# Patient Record
Sex: Female | Born: 1950
Health system: Southern US, Community
[De-identification: ages and names within clinical notes are randomized; demographics above are authoritative.]

## PROBLEM LIST (undated history)

## (undated) DIAGNOSIS — I499 Cardiac arrhythmia, unspecified: Secondary | ICD-10-CM

## (undated) DIAGNOSIS — D649 Anemia, unspecified: Secondary | ICD-10-CM

## (undated) DIAGNOSIS — I639 Cerebral infarction, unspecified: Secondary | ICD-10-CM

## (undated) DIAGNOSIS — I4891 Unspecified atrial fibrillation: Secondary | ICD-10-CM

## (undated) DIAGNOSIS — H269 Unspecified cataract: Secondary | ICD-10-CM

## (undated) DIAGNOSIS — Z5189 Encounter for other specified aftercare: Secondary | ICD-10-CM

## (undated) DIAGNOSIS — T7840XA Allergy, unspecified, initial encounter: Secondary | ICD-10-CM

## (undated) DIAGNOSIS — I739 Peripheral vascular disease, unspecified: Secondary | ICD-10-CM

## (undated) DIAGNOSIS — M810 Age-related osteoporosis without current pathological fracture: Secondary | ICD-10-CM

## (undated) DIAGNOSIS — C801 Malignant (primary) neoplasm, unspecified: Secondary | ICD-10-CM

## (undated) DIAGNOSIS — Z8719 Personal history of other diseases of the digestive system: Secondary | ICD-10-CM

## (undated) DIAGNOSIS — K219 Gastro-esophageal reflux disease without esophagitis: Secondary | ICD-10-CM

## (undated) DIAGNOSIS — E785 Hyperlipidemia, unspecified: Secondary | ICD-10-CM

## (undated) DIAGNOSIS — D126 Benign neoplasm of colon, unspecified: Secondary | ICD-10-CM

## (undated) HISTORY — DX: Unspecified cataract: H26.9

## (undated) HISTORY — DX: Malignant (primary) neoplasm, unspecified: C80.1

## (undated) HISTORY — DX: Encounter for other specified aftercare: Z51.89

## (undated) HISTORY — DX: Anemia, unspecified: D64.9

## (undated) HISTORY — DX: Gastro-esophageal reflux disease without esophagitis: K21.9

## (undated) HISTORY — DX: Age-related osteoporosis without current pathological fracture: M81.0

## (undated) HISTORY — PX: EYE SURGERY: SHX253

## (undated) HISTORY — DX: Benign neoplasm of colon, unspecified: D12.6

## (undated) HISTORY — DX: Hyperlipidemia, unspecified: E78.5

## (undated) HISTORY — DX: Cardiac arrhythmia, unspecified: I49.9

## (undated) HISTORY — DX: Allergy, unspecified, initial encounter: T78.40XA

---

## 1977-05-17 HISTORY — PX: LAPAROTOMY: SHX154

## 1997-05-17 HISTORY — PX: OTHER SURGICAL HISTORY: SHX169

## 2011-05-18 DIAGNOSIS — C4490 Unspecified malignant neoplasm of skin, unspecified: Secondary | ICD-10-CM

## 2011-05-18 HISTORY — DX: Unspecified malignant neoplasm of skin, unspecified: C44.90

## 2014-04-16 LAB — HM MAMMOGRAPHY: HM MAMMO: NORMAL

## 2014-05-17 HISTORY — PX: COLONOSCOPY: SHX174

## 2014-07-16 LAB — HM COLONOSCOPY: HM Colonoscopy: 1

## 2014-10-29 ENCOUNTER — Encounter: Payer: Self-pay | Admitting: Physician Assistant

## 2014-11-07 ENCOUNTER — Other Ambulatory Visit: Payer: Self-pay | Admitting: Family Medicine

## 2014-11-07 ENCOUNTER — Encounter: Payer: Self-pay | Admitting: Physician Assistant

## 2014-11-07 ENCOUNTER — Ambulatory Visit (INDEPENDENT_AMBULATORY_CARE_PROVIDER_SITE_OTHER): Payer: Federal, State, Local not specified - PPO | Admitting: Physician Assistant

## 2014-11-07 VITALS — BP 110/78 | HR 70 | Temp 97.5°F | Resp 16 | Ht 60.0 in | Wt 118.0 lb

## 2014-11-07 DIAGNOSIS — R87619 Unspecified abnormal cytological findings in specimens from cervix uteri: Secondary | ICD-10-CM | POA: Diagnosis not present

## 2014-11-07 DIAGNOSIS — N905 Atrophy of vulva: Secondary | ICD-10-CM

## 2014-11-07 MED ORDER — ESTRADIOL 0.1 MG/GM VA CREA: 1.0000 | TOPICAL_CREAM | Freq: Every day | VAGINAL | Status: DC

## 2014-11-07 NOTE — Telephone Encounter (Signed)
escribe error, vaginal cream resent

## 2014-11-07 NOTE — Progress Notes (Signed)
Patient ID: Terri Alexander MRN: [id number], DOB: 05/15/1951, 64 y.o. Date of Encounter: @DATE @  Chief Complaint:  Chief Complaint  Patient presents with  . new pt get est    not due for CPE    SHE GOES BY THE NAME   "Terri Alexander"------NOT Terri Alexander HPI: 64 y.o. year old white female  presents with her husband for visit today.  They are both here to establish care as new patients to our practice.  They recently moved here from [place] and 08/2014.  She went to [facility]. She states that she would go there once a year for complete physical exam. Otherwise would just go there PRN. Says her last complete physical exam there was 01/2014.  Says that she also was seeing a Editor, commissioning.  Says that she recently had an abnormal Pap smear and they told her to have a repeat Pap smear in 6 months.  She went for that 6 month follow-up in 07/2014. Pap Smear was normal but they did schedule her follow-up visit there for October. Says that she has had an abnormal Pap smear once or twice over the years. However says that she has never had to have any type of procedures done--- no LEEP procedures etc. Discussed doing a referral to gynecologist to have follow-up with the GYN. Says that she has had no other GYN history.  She would like to do her follow-up Pap smear here when she comes for her physical here in October. Says that if she ends up having any further problems then she would be glad to go to the gynecologist if necessary.  Says that she uses Premarin cream "for dryness". Is on no other prescription medications.  She has no complaints or concerns today.  Just here to establish care.   Past Medical History  Diagnosis Date  . Allergy   . Cancer 2013    Skin cancer  . Blood transfusion without reported diagnosis     75 months old     Home Meds: Outpatient Prescriptions Prior to Visit  Medication Sig Dispense Refill  . aspirin 81 MG tablet Take 81 mg by mouth daily.    . Biotin  1000 MCG tablet Take 1,000 mcg by mouth 3 (three) times daily.    . Calcium Carb-Cholecalciferol (CALCIUM 600 + D PO) Take 600 mg by mouth once.    . loratadine (ALLERGY RELIEF) 10 MG tablet Take 10 mg by mouth daily.    . Methylcellulose, Laxative, (FIBER THERAPY PO) Take by mouth.    . Multiple Vitamin (MULTIVITAMIN) tablet Take 1 tablet by mouth daily.    . Vitamin D, Cholecalciferol, 400 UNITS CAPS Take 400 Units by mouth once.    . vitamin E 400 UNIT capsule Take 400 Units by mouth daily.    Marland Kitchen estrogens, conjugated, (PREMARIN) 1.25 MG tablet Take 1 mg by mouth daily.     No facility-administered medications prior to visit.    Allergies: No Known Allergies  History   Social History  . Marital Status: Married    Spouse Name: N/A  . Number of Children: N/A  . Years of Education: N/A   Occupational History  . Not on file.   Social History Main Topics  . Smoking status: Never Smoker   . Smokeless tobacco: Never Used  . Alcohol Use: 0.6 oz/week    1 Glasses of wine per week  . Drug Use: No  . Sexual Activity: Not Currently   Other Topics Concern  .  Not on file   Social History Narrative    Family History  Problem Relation Age of Onset  . Arthritis Mother   . Heart disease Mother   . Vision loss Mother   . Stroke Mother   . Depression Sister   . Cancer Maternal Grandmother   . Cancer Maternal Grandfather   . Early death Maternal Grandfather   . Heart disease Maternal Grandfather   . Early death Paternal Grandfather   . Heart disease Paternal Grandfather   . Depression Sister   . Diabetes Other     Juvenile     Review of Systems:  See HPI for pertinent ROS. All other ROS negative.    Physical Exam: Blood pressure 110/78, pulse 70, temperature 97.5 F (36.4 C), temperature source Oral, resp. rate 16, height 5' (1.524 m), weight 118 lb (53.524 kg)., Body mass index is 23.05 kg/(m^2). General: WNWD WF. Appears in no acute distress.  Neck: Supple. No  thyromegaly. No lymphadenopathy. No Carotid bruits. Lungs: Clear bilaterally to auscultation without wheezes, rales, or rhonchi. Breathing is unlabored. Heart: RRR with S1 S2. No murmurs, rubs, or gallops. Musculoskeletal:  Strength and tone normal for age. Extremities/Skin: Warm and dry.  No LE edema. Neuro: Alert and oriented X 3. Moves all extremities spontaneously. Gait is normal. CNII-XII grossly in tact. Psych:  Responds to questions appropriately with a normal affect.     ASSESSMENT AND PLAN:  64 y.o. year old female with  1. Vulvar atrophy Continue Premarin cream  2. Abnormal cervical Papanicolaou smear, unspecified abnormal pap finding She had an abnormal Pap smear was told to follow-up 6 months.  She had this follow-up repeat Pap smear 07/2014. That was normal.  She was scheduled for follow-up with gynecology in October. She will follow-up here for CPE with Pap smear in October.  She is scheduling a follow-up appointment with me for October. Complete physical exam with Pap smear. She is to come fasting to that appointment.  PREVENTIVE CARE:  Mammogram: 04/2014  Colonoscopy: 07/2014----one small polyp. Negative. Repeat 5 years.  Immunizations: Influenza vaccine--------------------- she reports that she gets this each fall Tetanus vaccine----------------------- he reports that this is overdue and she is interested in getting this. Unfortunately, our office is out of this today 10/2014.---WILL ADMINISITER THIS AT HER CPE 01/2015 Pneumonia vaccine-------------------- she reports that she has received no pneumonia vaccine so far. No indication to require this until age 21.------------------WILL ADMINISTER AT AGE 75 Zostavax--------------------------------- she received this 04/23/2013  Signed, Olean Ree [place], [place], [facility] 11/07/2014 11:48 AM

## 2014-11-08 ENCOUNTER — Ambulatory Visit (INDEPENDENT_AMBULATORY_CARE_PROVIDER_SITE_OTHER): Payer: Federal, State, Local not specified - PPO | Admitting: Family Medicine

## 2014-11-08 ENCOUNTER — Ambulatory Visit
Admission: RE | Admit: 2014-11-08 | Discharge: 2014-11-08 | Disposition: A | Discharge status: Home / Self Care | Payer: Federal, State, Local not specified - PPO | Source: Ambulatory Visit | Attending: Family Medicine | Admitting: Family Medicine

## 2014-11-08 ENCOUNTER — Encounter: Payer: Self-pay | Admitting: Family Medicine

## 2014-11-08 VITALS — BP 138/72 | HR 70 | Temp 98.0°F | Resp 14 | Ht 60.0 in | Wt 119.0 lb

## 2014-11-08 DIAGNOSIS — W19XXXA Unspecified fall, initial encounter: Secondary | ICD-10-CM | POA: Diagnosis not present

## 2014-11-08 DIAGNOSIS — S59902A Unspecified injury of left elbow, initial encounter: Secondary | ICD-10-CM | POA: Diagnosis not present

## 2014-11-08 DIAGNOSIS — S52122A Displaced fracture of head of left radius, initial encounter for closed fracture: Secondary | ICD-10-CM

## 2014-11-08 DIAGNOSIS — M79602 Pain in left arm: Secondary | ICD-10-CM

## 2014-11-08 NOTE — Progress Notes (Signed)
Patient ID: Terri Alexander, female   DOB: 1951/01/14, 64 y.o.   MRN: [id number]   Subjective:    Patient ID: Terri Alexander, female    DOB: 08-Nov-1950, 64 y.o.   MRN: [id number]  Patient presents for Fall  patient here status post fall. She was playing with her grandson out of the yard yesterday  When She Stepped Problem with Her Foot and [place] with Her Hands Outstretched to Brace Herself. Since Then She's Noted That She Cannot Fully Extend Her Left Elbow and She Also Has Pain at the Wrist. She has taken tylenol for pain.     Review Of Systems:  GEN- denies fatigue, fever, weight loss,weakness, recent illness HEENT- denies eye drainage, change in vision, nasal discharge, CVS- denies chest pain, palpitations RESP- denies SOB, cough, wheeze ABD- denies N/V, change in stools, abd pain GU- denies dysuria, hematuria, dribbling, incontinence MSK- + joint pain, muscle aches, injury Neuro- denies headache, dizziness, syncope, seizure activity       Objective:    BP 138/72 mmHg  Pulse 70  Temp(Src) 98 F (36.7 C) (Oral)  Resp 14  Ht 5' (1.524 m)  Wt 119 lb (53.978 kg)  BMI 23.24 kg/m2 GEN- NAD, alert and oriented x3 Neck- supple, good ROM,  MSK- left arm- gross curvature noted medially at elbow bow, muscle or fat appears to be displaced, TTP, mild swelling, decreased ROM elbow unable to extend fully, left wrist- fair ROM, TTP over radial aspect no bruising Pulse- Radial 2+        Assessment & Plan:      Problem List Items Addressed This Visit    None    Visit Diagnoses    Fall, initial encounter    -  Primary    Relevant Orders    DG Wrist Complete Left (Completed)    DG Forearm Left (Completed)    DG Elbow Complete Left (Completed)    Pain of left upper extremity        Relevant Orders    DG Wrist Complete Left (Completed)    DG Forearm Left (Completed)    DG Elbow Complete Left (Completed)    Elbow injury, left, initial encounter        Concern for fracture based on  appearance, xray done, shows fat pad sign, subtle radial head fracture, send to ortho, pt declined pain meds at visit, will use OTC meds    Relevant Orders    DG Wrist Complete Left (Completed)    DG Forearm Left (Completed)    DG Elbow Complete Left (Completed)       Note: This dictation was prepared with Dragon dictation along with smaller phrase technology. Any transcriptional errors that result from this process are unintentional.

## 2014-11-08 NOTE — Patient Instructions (Signed)
Use naprosyn twice a day with ICE Get the xray done- I will call with results [place], [place]

## 2014-11-11 ENCOUNTER — Encounter: Payer: Self-pay | Admitting: Family Medicine

## 2015-03-10 ENCOUNTER — Encounter: Payer: Self-pay | Admitting: Physician Assistant

## 2015-03-10 ENCOUNTER — Ambulatory Visit (INDEPENDENT_AMBULATORY_CARE_PROVIDER_SITE_OTHER): Payer: Federal, State, Local not specified - PPO | Admitting: Physician Assistant

## 2015-03-10 VITALS — BP 104/62 | HR 54 | Temp 98.0°F | Resp 18 | Ht 61.5 in | Wt 123.0 lb

## 2015-03-10 DIAGNOSIS — M8580 Other specified disorders of bone density and structure, unspecified site: Secondary | ICD-10-CM | POA: Insufficient documentation

## 2015-03-10 DIAGNOSIS — Z23 Encounter for immunization: Secondary | ICD-10-CM

## 2015-03-10 DIAGNOSIS — R87619 Unspecified abnormal cytological findings in specimens from cervix uteri: Secondary | ICD-10-CM | POA: Diagnosis not present

## 2015-03-10 DIAGNOSIS — N905 Atrophy of vulva: Secondary | ICD-10-CM

## 2015-03-10 DIAGNOSIS — Z0000 Encounter for general adult medical examination without abnormal findings: Secondary | ICD-10-CM

## 2015-03-10 NOTE — Progress Notes (Signed)
Patient ID: Terri Alexander MRN: [id number], DOB: Sep 19, 1950, 64 y.o. Date of Encounter: 03/10/2015,   Chief Complaint: Physical (CPE)  HPI: 64 y.o. y/o female  here for CPE.   Expand All Collapse All                SHE GOES BY THE NAME "Terri Alexander"------NOT Terri Alexander  THE FOLLOWING IS COPIED FROM HER OV NOTE 11/07/2014: HPI: 64 y.o. year old white female presents with her husband for visit today.  They are both here to establish care as new patients to our practice.  They recently moved here from [place] and 08/2014.  She went to [place] [facility]. She states that she would go there once a year for complete physical exam. Otherwise would just go there PRN. Says her last complete physical exam there was 01/2014.  Says that she also was seeing a Editor, commissioning.  Says that she recently had an abnormal Pap smear and they told her to have a repeat Pap smear in 6 months.  She went for that 6 month follow-up in 07/2014. Pap Smear was normal but they did schedule her follow-up visit there for October. Says that she has had an abnormal Pap smear once or twice over the years. However says that she has never had to have any type of procedures done--- no LEEP procedures etc. Discussed doing a referral to gynecologist to have follow-up with the GYN. Says that she has had no other GYN history.  She would like to do her follow-up Pap smear here when she comes for her physical here in October. Says that if she ends up having any further problems then she would be glad to go to the gynecologist if necessary.  Says that she uses Premarin cream "for dryness". Is on no other prescription medications.  She has no complaints or concerns today. Just here to establish care.      TODAY---03/10/2015: She presents for complete physical exam today. She has no specific complaints or concerns.  Their son went to college at [employer] and stayed in the [place] area since then. He is now  married with 1 child-- a 65-year-old.  This is why patient and her husband moved here is to be with their son and grandchild. Pt states that she stays quite busy running around after this 64-year-old. States that she also tries to get out and walk 3 days a week.  ----I RECEIVED RECORDS FROM HER PRIOR PHYSICIAN---I REVIEWED AL OF THESE RECORDS-----ALL PERTINENT INFO TRANSFERRED INTO THIS NOTE---RECORDS SENT TO SCAN---------------  01/15/2009----Pap Smear---ASCUS. (NO HPV Cotest was performed) Last complete physical exam was 03/05/2014  06/18/2013 she did have a visit to discuss results of DEXA. Do not see any documentation regarding the T score an actual findings but it does say that for assessment with osteopenia and as the plan was to increase calcium to twice a day with meals and increase vitamin D to 2000 units daily weightbearing exercise and reevaluate 2 years   Review of Systems: Consitutional: No fever, chills, fatigue, night sweats, lymphadenopathy. No significant/unexplained weight changes. Eyes: No visual changes, eye redness, or discharge. ENT/Mouth: No ear pain, sore throat, nasal drainage, or sinus pain. Cardiovascular: No chest pressure,heaviness, tightness or squeezing, even with exertion. No increased shortness of breath or dyspnea on exertion.No palpitations, edema, orthopnea, PND. Respiratory: No cough, hemoptysis, SOB, or wheezing. Gastrointestinal: No anorexia, dysphagia, reflux, pain, nausea, vomiting, hematemesis, diarrhea, constipation, BRBPR, or melena. Breast: No mass, nodules, bulging, or retraction. No skin  changes or inflammation. No nipple discharge. No lymphadenopathy. Genitourinary: No dysuria, hematuria, incontinence, vaginal discharge, pruritis, burning, abnormal bleeding, or pain. Musculoskeletal: No decreased ROM, No joint pain or swelling. No significant pain in neck, back, or extremities. Skin: No rash, pruritis, or concerning lesions. Neurological: No headache,  dizziness, syncope, seizures, tremors, memory loss, coordination problems, or paresthesias. Psychological: No anxiety, depression, hallucinations, SI/HI. Endocrine: No polydipsia, polyphagia, polyuria, or known diabetes.No increased fatigue. No palpitations/rapid heart rate. No significant/unexplained weight change. All other systems were reviewed and are otherwise negative.  Past Medical History  Diagnosis Date  . Allergy   . Cancer Terri Alexander) 2013    Skin cancer  . Blood transfusion without reported diagnosis     41 months old     Past Surgical History  Procedure Laterality Date  . Eye surgery    . Nissan fundiplication  10/17/3352    Home Meds:  Outpatient Prescriptions Prior to Visit  Medication Sig Dispense Refill  . aspirin 81 MG tablet Take 81 mg by mouth daily.    . Calcium Carb-Cholecalciferol (CALCIUM 600 + D PO) Take 600 mg by mouth once.    . Cranberry-Cholecalciferol (SUPER CRANBERRY/VITAMIN D3) 4200-500 MG-UNIT CAPS Take by mouth.    . estradiol (ESTRACE) 0.1 MG/GM vaginal cream Place 1 Applicatorful vaginally at bedtime. 42.5 g 12  . loratadine (ALLERGY RELIEF) 10 MG tablet Take 10 mg by mouth daily.    . Methylcellulose, Laxative, (FIBER THERAPY PO) Take by mouth.    . Multiple Vitamin (MULTIVITAMIN) tablet Take 1 tablet by mouth daily.    . Vitamin D, Cholecalciferol, 400 UNITS CAPS Take 400 Units by mouth once.    . vitamin E 400 UNIT capsule Take 400 Units by mouth daily.    . Biotin 1000 MCG tablet Take 1,000 mcg by mouth 3 (three) times daily.     No facility-administered medications prior to visit.    Allergies: No Known Allergies  Social History   Social History  . Marital Status: Married    Spouse Name: N/A  . Number of Children: N/A  . Years of Education: N/A   Occupational History  . Not on file.   Social History Main Topics  . Smoking status: Never Smoker   . Smokeless tobacco: Never Used  . Alcohol Use: 0.6 oz/week    1 Glasses of wine per  week  . Drug Use: No  . Sexual Activity: Not Currently   Other Topics Concern  . Not on file   Social History Narrative    Family History  Problem Relation Age of Onset  . Arthritis Mother   . Heart disease Mother   . Vision loss Mother   . Stroke Mother   . Depression Sister   . Cancer Maternal Grandmother   . Cancer Maternal Grandfather   . Early death Maternal Grandfather   . Heart disease Maternal Grandfather   . Early death Paternal Grandfather   . Heart disease Paternal Grandfather   . Depression Sister   . Diabetes Other     Juvenile    Physical Exam: Blood pressure 104/62, pulse 54, temperature 98 F (36.7 C), temperature source Oral, resp. rate 18, height 5' 1.5" (1.562 m), weight 123 lb (55.792 kg)., Body mass index is 22.87 kg/(m^2). General: Petite, Well developed, well nourished,WF. Appears in no acute distress. HEENT: Normocephalic, atraumatic. Conjunctiva pink, sclera non-icteric. Pupils 2 mm constricting to 1 mm, round, regular, and equally reactive to light and accomodation. EOMI. Internal auditory canal  clear. TMs with good cone of light and without pathology. Nasal mucosa pink. Nares are without discharge. No sinus tenderness. Oral mucosa pink.  Pharynx without exudate.   Neck: Supple. Trachea midline. No thyromegaly. Full ROM. No lymphadenopathy.No Carotid Bruits. Lungs: Clear to auscultation bilaterally without wheezes, rales, or rhonchi. Breathing is of normal effort and unlabored. Cardiovascular: RRR with S1 S2. No murmurs, rubs, or gallops. Distal pulses 2+ symmetrically. No carotid or abdominal bruits. Breast: Symmetrical. No masses. Nipples without discharge. Abdomen: Soft, non-tender, non-distended with normoactive bowel sounds. No hepatosplenomegaly or masses. No rebound/guarding. No CVA tenderness. No hernias.  Genitourinary:  External genitalia without lesions. Vaginal mucosa pink.No discharge present. Cervix pink and without discharge. No cervical  tenderness.Normal uterus size. No adnexal mass or tenderness.  Pap smear taken. Musculoskeletal: Full range of motion and 5/5 strength throughout. Without swelling, atrophy, tenderness, crepitus, or warmth. Extremities without clubbing, cyanosis, or edema.  Skin: Warm and moist without erythema, ecchymosis, wounds, or rash. Neuro: A+Ox3. CN II-XII grossly intact. Moves all extremities spontaneously. Full sensation throughout. Normal gait. DTR 2+ throughout upper and lower extremities. Psych:  Responds to questions appropriately with a normal affect.   Assessment/Plan:  64 y.o. y/o female here for CPE  1. Visit for preventive health examination  A. Screening Labs: - CBC with Differential/Platelet; Future - COMPLETE METABOLIC PANEL WITH GFR; Future - Lipid panel; Future - TSH; Future - Vit D  25 hydroxy (rtn osteoporosis monitoring); Future  B. Pap - PAP, Thin Prep w/HPV rflx HPV Type 16/18   C. Screening Mammogram: She had a mammogram December 2015 She is aware that she will need repeat December 2016. I will go ahead and place an order for a mammogram since she is new to the area and make a note that it needs to be scheduled for after December. - MM Digital Screening; Future    D. DEXA/BMD:  I have received her records from her previous primary care physician. We'll review those records to see if she has had a bone density scan. From Prior Records: 06/18/2013 she did have a visit to discuss results of DEXA. Do not see any documentation regarding the T score an actual findings but it does say that for assessment with osteopenia and as the plan was to increase calcium to twice a day with meals and increase vitamin D to 2000 units daily weightbearing exercise and reevaluate 2 years Later, when reviewing records,  I did find DEXA scan report from 2 different dates 06/30/2006 T score -1.6. Was to use calcium and vitamin D. 01/15/2009 T score -1.6 --Provider note at that time did note that this  was showing the same as it had in 2008.   E. Colorectal Cancer Screening: She had colonoscopy March 2016. One small polyp. Otherwise negative. Repeat 5 years.  F. Immunizations:  Influenza:  Given here 03/10/2015 Tetanus:  Evan here 03/10/2015 Pneumococcal: She reported that she has had no pneumonia vaccine so far. She has no indication to require this until age 30. Will start pneumonia vaccines at age 25 Zostavax: She received this 04/23/2013     Need for prophylactic vaccination and inoculation against influenza - Flu Vaccine QUAD 36+ mos IM  Need for Tdap vaccination - Tdap vaccine greater than or equal to 7yo IM  2. Vulvar atrophy Continue Premarin cream  3. Abnormal cervical Papanicolaou smear, unspecified abnormal pap finding PER PT: She had an abnormal Pap smear was told to follow-up 6 months.  She had this follow-up  repeat Pap smear 07/2014. That was normal.  She was scheduled for follow-up with gynecology in October. She will follow-up here for CPE with Pap smear in October. See # 1 above. Pap smear repeated today 03/10/15. If this one is abnormal, will have her follow-up with gynecologist. I REVIEWED ALL RECORDS FROM PRIOR PROVIDER:  Pap smear 12/28/2007 negative for intraparenchymal epithelial lesion or malignancy. No HPV test done. Pap smear 07/03/07 negative for intraepithelial lesion and malignancy. No HPV co-test performed. Pap smear 06/30/06 negative for intraepithelial lesion and malignancy. No HPV co-testing performed. Pap smear 05/18/2005 negative for intraepithelial lesion or malignancy. No HPV co-testing performed. Pap smear 05/13/04 negative for intraepithelial lesion and malignancy. No HPV co-testing performed. Pap smear 01/15/2009 epithelial cell abnormality. Atypical squamous cells of undetermined significance. No HPV co-testing performed.    Signed, [address] [place], [place], [facility] 03/10/2015 10:49 AM

## 2015-03-11 ENCOUNTER — Other Ambulatory Visit: Payer: Federal, State, Local not specified - PPO

## 2015-03-11 DIAGNOSIS — Z0000 Encounter for general adult medical examination without abnormal findings: Secondary | ICD-10-CM

## 2015-03-11 LAB — CBC WITH DIFFERENTIAL/PLATELET
BASOS ABS: 0.1 10*3/uL (ref 0.0–0.1)
BASOS PCT: 1 % (ref 0–1)
Eosinophils Absolute: 0.1 10*3/uL (ref 0.0–0.7)
Eosinophils Relative: 2 % (ref 0–5)
HCT: 43.8 % (ref 36.0–46.0)
HEMOGLOBIN: 14 g/dL (ref 12.0–15.0)
LYMPHS PCT: 27 % (ref 12–46)
Lymphs Abs: 1.8 10*3/uL (ref 0.7–4.0)
MCH: 29.1 pg (ref 26.0–34.0)
MCHC: 32 g/dL (ref 30.0–36.0)
MCV: 91.1 fL (ref 78.0–100.0)
MPV: 10.6 fL (ref 8.6–12.4)
Monocytes Absolute: 0.7 10*3/uL (ref 0.1–1.0)
Monocytes Relative: 10 % (ref 3–12)
NEUTROS ABS: 4 10*3/uL (ref 1.7–7.7)
NEUTROS PCT: 60 % (ref 43–77)
Platelets: 290 10*3/uL (ref 150–400)
RBC: 4.81 MIL/uL (ref 3.87–5.11)
RDW: 14.1 % (ref 11.5–15.5)
WBC: 6.6 10*3/uL (ref 4.0–10.5)

## 2015-03-11 LAB — LIPID PANEL
Cholesterol: 190 mg/dL (ref 125–200)
HDL: 75 mg/dL (ref >=46)
LDL CALC: 93 mg/dL (ref <130)
Total CHOL/HDL Ratio: 2.5 Ratio (ref <=5.0)
Triglycerides: 108 mg/dL (ref <150)
VLDL: 22 mg/dL (ref <30)

## 2015-03-11 LAB — COMPLETE METABOLIC PANEL WITH GFR
ALBUMIN: 4.1 g/dL (ref 3.6–5.1)
ALK PHOS: 74 U/L (ref 33–130)
ALT: 10 U/L (ref 6–29)
AST: 17 U/L (ref 10–35)
BILIRUBIN TOTAL: 0.8 mg/dL (ref 0.2–1.2)
BUN: 17 mg/dL (ref 7–25)
CO2: 30 mmol/L (ref 20–31)
CREATININE: 0.71 mg/dL (ref 0.50–0.99)
Calcium: 9.4 mg/dL (ref 8.6–10.4)
Chloride: 102 mmol/L (ref 98–110)
GLUCOSE: 86 mg/dL (ref 70–99)
Potassium: 4.4 mmol/L (ref 3.5–5.3)
SODIUM: 141 mmol/L (ref 135–146)
TOTAL PROTEIN: 6.9 g/dL (ref 6.1–8.1)

## 2015-03-11 LAB — TSH: TSH: 2.092 u[IU]/mL (ref 0.350–4.500)

## 2015-03-12 LAB — PAP, THIN PREP W/HPV RFLX HPV TYPE 16/18: HPV DNA HIGH RISK: NOT DETECTED

## 2015-03-12 LAB — VITAMIN D 25 HYDROXY (VIT D DEFICIENCY, FRACTURES): Vit D, 25-Hydroxy: 40 ng/mL (ref 30–100)

## 2015-03-13 ENCOUNTER — Encounter: Payer: Self-pay | Admitting: Family Medicine

## 2015-03-19 ENCOUNTER — Encounter: Payer: Self-pay | Admitting: Physician Assistant

## 2015-04-08 ENCOUNTER — Encounter: Payer: Self-pay | Admitting: *Deleted

## 2015-04-15 ENCOUNTER — Encounter: Payer: Self-pay | Admitting: Physician Assistant

## 2015-04-21 DIAGNOSIS — H43313 Vitreous membranes and strands, bilateral: Secondary | ICD-10-CM | POA: Diagnosis not present

## 2015-05-02 ENCOUNTER — Ambulatory Visit
Admission: RE | Admit: 2015-05-02 | Discharge: 2015-05-02 | Disposition: A | Discharge status: Home / Self Care | Payer: Medicare Other | Source: Ambulatory Visit | Attending: Physician Assistant | Admitting: Physician Assistant

## 2015-05-02 DIAGNOSIS — Z0000 Encounter for general adult medical examination without abnormal findings: Secondary | ICD-10-CM

## 2015-05-02 DIAGNOSIS — Z1231 Encounter for screening mammogram for malignant neoplasm of breast: Secondary | ICD-10-CM | POA: Diagnosis not present

## 2015-09-02 DIAGNOSIS — H43313 Vitreous membranes and strands, bilateral: Secondary | ICD-10-CM | POA: Diagnosis not present

## 2015-09-22 DIAGNOSIS — H31092 Other chorioretinal scars, left eye: Secondary | ICD-10-CM | POA: Diagnosis not present

## 2016-03-15 ENCOUNTER — Ambulatory Visit (INDEPENDENT_AMBULATORY_CARE_PROVIDER_SITE_OTHER): Payer: Medicare Other | Admitting: Physician Assistant

## 2016-03-15 ENCOUNTER — Encounter: Payer: Self-pay | Admitting: Physician Assistant

## 2016-03-15 VITALS — BP 108/60 | HR 70 | Temp 97.9°F | Resp 16 | Ht 62.0 in | Wt 125.0 lb

## 2016-03-15 DIAGNOSIS — Z23 Encounter for immunization: Secondary | ICD-10-CM

## 2016-03-15 DIAGNOSIS — Z79899 Other long term (current) drug therapy: Secondary | ICD-10-CM | POA: Diagnosis not present

## 2016-03-15 DIAGNOSIS — Z85828 Personal history of other malignant neoplasm of skin: Secondary | ICD-10-CM

## 2016-03-15 DIAGNOSIS — M8580 Other specified disorders of bone density and structure, unspecified site: Secondary | ICD-10-CM | POA: Diagnosis not present

## 2016-03-15 DIAGNOSIS — Z0000 Encounter for general adult medical examination without abnormal findings: Secondary | ICD-10-CM

## 2016-03-15 DIAGNOSIS — N905 Atrophy of vulva: Secondary | ICD-10-CM

## 2016-03-15 DIAGNOSIS — E559 Vitamin D deficiency, unspecified: Secondary | ICD-10-CM | POA: Diagnosis not present

## 2016-03-15 LAB — CBC WITH DIFFERENTIAL/PLATELET
Basophils Absolute: 62 cells/uL (ref 0–200)
Basophils Relative: 1 %
EOS PCT: 2 %
Eosinophils Absolute: 124 cells/uL (ref 15–500)
HEMATOCRIT: 41.9 % (ref 35.0–45.0)
Hemoglobin: 13.6 g/dL (ref 12.0–15.0)
LYMPHS PCT: 27 %
Lymphs Abs: 1674 cells/uL (ref 850–3900)
MCH: 30.1 pg (ref 27.0–33.0)
MCHC: 32.5 g/dL (ref 32.0–36.0)
MCV: 92.7 fL (ref 80.0–100.0)
MONOS PCT: 14 %
MPV: 10 fL (ref 7.5–12.5)
Monocytes Absolute: 868 cells/uL (ref 200–950)
NEUTROS PCT: 56 %
Neutro Abs: 3472 cells/uL (ref 1500–7800)
PLATELETS: 318 10*3/uL (ref 140–400)
RBC: 4.52 MIL/uL (ref 3.80–5.10)
RDW: 14.6 % (ref 11.0–15.0)
WBC: 6.2 10*3/uL (ref 3.8–10.8)

## 2016-03-15 LAB — COMPLETE METABOLIC PANEL WITH GFR
ALT: 13 U/L (ref 6–29)
AST: 17 U/L (ref 10–35)
Albumin: 4.3 g/dL (ref 3.6–5.1)
Alkaline Phosphatase: 69 U/L (ref 33–130)
BUN: 17 mg/dL (ref 7–25)
CALCIUM: 9.6 mg/dL (ref 8.6–10.4)
CHLORIDE: 103 mmol/L (ref 98–110)
CO2: 30 mmol/L (ref 20–31)
Creat: 0.66 mg/dL (ref 0.50–0.99)
GFR, Est African American: >89 mL/min (ref >=60)
GFR, Est Non African American: >89 mL/min (ref >=60)
Glucose, Bld: 90 mg/dL (ref 70–99)
POTASSIUM: 4.8 mmol/L (ref 3.5–5.3)
SODIUM: 141 mmol/L (ref 135–146)
Total Bilirubin: 0.6 mg/dL (ref 0.2–1.2)
Total Protein: 6.9 g/dL (ref 6.1–8.1)

## 2016-03-15 LAB — LIPID PANEL
CHOL/HDL RATIO: 3 ratio (ref <=5.0)
CHOLESTEROL: 211 mg/dL — AB (ref 125–200)
HDL: 71 mg/dL (ref >=46)
LDL Cholesterol: 117 mg/dL (ref <130)
Triglycerides: 116 mg/dL (ref <150)
VLDL: 23 mg/dL (ref <30)

## 2016-03-15 LAB — TSH: TSH: 2.42 m[IU]/L

## 2016-03-15 NOTE — Addendum Note (Signed)
Addended by: Vonna Kotyk A on: 03/15/2016 04:58 PM   Modules accepted: Orders

## 2016-03-15 NOTE — Progress Notes (Signed)
Patient ID: Terri Alexander MRN: [id number], DOB: Mar 10, 1951, 65 y.o. Date of Encounter: 03/15/2016,   Chief Complaint: Physical (CPE)  Subjective:   Patient presents for Medicare Annual/Subsequent preventive examination.   Review Past Medical/Family/Social: All of this information is reviewed today.  Risk Factors  Current exercise habits: She is very active. Just traveled to six states in [place] issues discussed: She eats a healthy diet low saturated fat low-carb diet.  Cardiac risk factors: Age  Depression Screen  (Note: if answer to either of the following is "Yes", a more complete depression screening is indicated)  Over the past two weeks, have you felt down, depressed or hopeless? No Over the past two weeks, have you felt little interest or pleasure in doing things? No Have you lost interest or pleasure in daily life? No Do you often feel hopeless? No Do you cry easily over simple problems? No   Activities of Daily Living  In your present state of health, do you have any difficulty performing the following activities?:  Driving? No  Managing money? No  Feeding yourself? No  Getting from bed to chair? No  Climbing a flight of stairs? No  Preparing food and eating?: No  Bathing or showering? No  Getting dressed: No  Getting to the toilet? No  Using the toilet:No  Moving around from place to place: No  In the past year have you fallen or had a near fall?:No  Are you sexually active? No  Do you have more than one partner? No   Hearing Difficulties: No  Do you often ask people to speak up or repeat themselves? No  Do you experience ringing or noises in your ears? No Do you have difficulty understanding soft or whispered voices? No  Do you feel that you have a problem with memory? No Do you often misplace items? No  Do you feel safe at home? Yes  Cognitive Testing  Alert? Yes Normal Appearance?Yes  Oriented to person? Yes Place? Yes  Time? Yes    Recall of three objects? Yes  Can perform simple calculations? Yes  Displays appropriate judgment?Yes  Can read the correct time from a watch face?Yes   List the Names of Other Physician/Practitioners you currently use:  None  Indicate any recent Medical Services you may have received from other than [facility] providers in the past year (date may be approximate).  None Screening Tests / Date Colonoscopy                    ALL OF THIS INFORMATION IS DOCUMENTED BELOW Zostavax  Mammogram  Influenza Vaccine  Tetanus/tdap    Assessment:    Annual wellness medicare exam   Plan:    During the course of the visit the patient was educated and counseled about appropriate screening and preventive services including:  Screening mammography  Colorectal cancer screening  Shingles vaccine. Prescription given to that she can get the vaccine at the pharmacy or Medicare part D.  Screen + for depression. PHQ- 9 score of 12 (moderate depression). We discussed the options of counseling versus possibly a medication. I encouraged her strongly think about the counseling. She is going through some medical problems currently and her husband is as well Mrs. been very stressful for her. She says she will think about it. She does have Xanax to use as needed. Though she may benefit from an SSRI for her more depressive type symptoms but she wants to hold off at this  time.  I aksed her to please have her cardioloist send records since we have none on file.  Diet review for nutrition referral? Yes ____ Not Indicated __x__  Patient Instructions (the written plan) was given to the patient.  Medicare Attestation  I have personally reviewed:  The patient's medical and social history  Their use of alcohol, tobacco or illicit drugs  Their current medications and supplements  The patient's functional ability including ADLs,fall risks, home safety risks, cognitive, and hearing and visual impairment  Diet and physical  activities  Evidence for depression or mood disorders  The patient's weight, height, BMI, and visual acuity have been recorded in the chart. I have made referrals, counseling, and provided education to the patient based on review of the above and I have provided the patient with a written personalized care plan for preventive services.       HPI: 65 y.o. y/o female  here for CPE.   Expand All Collapse All                SHE GOES BY THE NAME "Terri Alexander"------NOT Mickaela  THE FOLLOWING IS COPIED FROM HER OV NOTE 11/07/2014: HPI: 65 y.o. year old white female presents with her husband for visit today.  They are both here to establish care as new patients to our practice.  They recently moved here from [place] and 08/2014.  She went to [facility]. She states that she would go there once a year for complete physical exam. Otherwise would just go there PRN. Says her last complete physical exam there was 01/2014.  Says that she also was seeing a Editor, commissioning.  Says that she recently had an abnormal Pap smear and they told her to have a repeat Pap smear in 6 months.  She went for that 6 month follow-up in 07/2014. Pap Smear was normal but they did schedule her follow-up visit there for October. Says that she has had an abnormal Pap smear once or twice over the years. However says that she has never had to have any type of procedures done--- no LEEP procedures etc. Discussed doing a referral to gynecologist to have follow-up with the GYN. Says that she has had no other GYN history.  She would like to do her follow-up Pap smear here when she comes for her physical here in October. Says that if she ends up having any further problems then she would be glad to go to the gynecologist if necessary.  Says that she uses Premarin cream "for dryness". Is on no other prescription medications.  She has no complaints or concerns today. Just here to establish care.         --03/10/2015: She presents for complete physical exam today. She has no specific complaints or concerns.  Their son went to college at [employer] and stayed in the [place] area since then. He is now married with 1 child-- a 30-year-old.  This is why patient and her husband moved here is to be with their son and grandchild. Pt states that she stays quite busy running around after this 67-year-old. States that she also tries to get out and walk 3 days a week.  ----I RECEIVED RECORDS FROM HER PRIOR PHYSICIAN---I REVIEWED AL OF THESE RECORDS-----ALL PERTINENT INFO TRANSFERRED INTO THIS NOTE---RECORDS SENT TO SCAN---------------  01/15/2009----Pap Smear---ASCUS. (NO HPV Cotest was performed) Last complete physical exam was 03/05/2014  06/18/2013 she did have a visit to discuss results of DEXA. Do not see any documentation regarding  the T score an actual findings but it does say that for assessment with osteopenia and as the plan was to increase calcium to twice a day with meals and increase vitamin D to 2000 units daily weightbearing exercise and reevaluate 2 years  03/15/2016: She reports that in the past she has had multiple skin cancers. She reports today that she has been noticing some areas of concern. She has no other complaints or concerns today.  Review of Systems: Consitutional: No fever, chills, fatigue, night sweats, lymphadenopathy. No significant/unexplained weight changes. Eyes: No visual changes, eye redness, or discharge. ENT/Mouth: No ear pain, sore throat, nasal drainage, or sinus pain. Cardiovascular: No chest pressure,heaviness, tightness or squeezing, even with exertion. No increased shortness of breath or dyspnea on exertion.No palpitations, edema, orthopnea, PND. Respiratory: No cough, hemoptysis, SOB, or wheezing. Gastrointestinal: No anorexia, dysphagia, reflux, pain, nausea, vomiting, hematemesis, diarrhea, constipation, BRBPR, or melena. Breast: No mass, nodules,  bulging, or retraction. No skin changes or inflammation. No nipple discharge. No lymphadenopathy. Genitourinary: No dysuria, hematuria, incontinence, vaginal discharge, pruritis, burning, abnormal bleeding, or pain. Musculoskeletal: No decreased ROM, No joint pain or swelling. No significant pain in neck, back, or extremities. Skin: --See HPI--- Neurological: No headache, dizziness, syncope, seizures, tremors, memory loss, coordination problems, or paresthesias. Psychological: No anxiety, depression, hallucinations, SI/HI. Endocrine: No polydipsia, polyphagia, polyuria, or known diabetes.No increased fatigue. No palpitations/rapid heart rate. No significant/unexplained weight change. All other systems were reviewed and are otherwise negative.  Past Medical History:  Diagnosis Date  . Allergy   . Blood transfusion without reported diagnosis    34 months old  . Cancer [facility]) 2013   Skin cancer     Past Surgical History:  Procedure Laterality Date  . EYE SURGERY    . nissan fundiplication  [id number]    Home Meds:  Outpatient Medications Prior to Visit  Medication Sig Dispense Refill  . aspirin 81 MG tablet Take 81 mg by mouth daily.    . Calcium Carb-Cholecalciferol (CALCIUM 600 + D PO) Take 600 mg by mouth once.    . Cranberry-Cholecalciferol (SUPER CRANBERRY/VITAMIN D3) 4200-500 MG-UNIT CAPS Take by mouth.    . estradiol (ESTRACE) 0.1 MG/GM vaginal cream Place 1 Applicatorful vaginally at bedtime. 42.5 g 12  . loratadine (ALLERGY RELIEF) 10 MG tablet Take 10 mg by mouth daily.    . Methylcellulose, Laxative, (FIBER THERAPY PO) Take by mouth.    . Multiple Vitamin (MULTIVITAMIN) tablet Take 1 tablet by mouth daily.    . Vitamin D, Cholecalciferol, 400 UNITS CAPS Take 400 Units by mouth once.    . vitamin E 400 UNIT capsule Take 400 Units by mouth daily.     No facility-administered medications prior to visit.     Allergies: No Known Allergies  Social History   Social History   . Marital status: Married    Spouse name: N/A  . Number of children: N/A  . Years of education: N/A   Occupational History  . Not on file.   Social History Main Topics  . Smoking status: Never Smoker  . Smokeless tobacco: Never Used  . Alcohol use 0.6 oz/week    1 Glasses of wine per week  . Drug use: No  . Sexual activity: Not Currently   Other Topics Concern  . Not on file   Social History Narrative  . No narrative on file    Family History  Problem Relation Age of Onset  . Arthritis Mother   . Heart  disease Mother   . Vision loss Mother   . Stroke Mother   . Depression Sister   . Cancer Maternal Grandmother   . Cancer Maternal Grandfather   . Early death Maternal Grandfather   . Heart disease Maternal Grandfather   . Early death Paternal Grandfather   . Heart disease Paternal Grandfather   . Depression Sister   . Diabetes Other     Juvenile    Physical Exam: Blood pressure 108/60, pulse 70, temperature 97.9 F (36.6 C), temperature source Oral, resp. rate 16, height 5\' 2"  (1.575 m), weight 125 lb (56.7 kg), SpO2 97 %., There is no height or weight on file to calculate BMI. General: Petite, Well developed, well nourished,WF. Appears in no acute distress. HEENT: Normocephalic, atraumatic. Conjunctiva pink, sclera non-icteric. Pupils 2 mm constricting to 1 mm, round, regular, and equally reactive to light and accomodation. EOMI. Internal auditory canal clear. TMs with good cone of light and without pathology. Nasal mucosa pink. Nares are without discharge. No sinus tenderness. Oral mucosa pink.  Pharynx without exudate.   Neck: Supple. Trachea midline. No thyromegaly. Full ROM. No lymphadenopathy.No Carotid Bruits. Lungs: Clear to auscultation bilaterally without wheezes, rales, or rhonchi. Breathing is of normal effort and unlabored. Cardiovascular: RRR with S1 S2. No murmurs, rubs, or gallops. Distal pulses 2+ symmetrically. No carotid or abdominal bruits. Breast:  Symmetrical. No masses. Nipples without discharge. Abdomen: Soft, non-tender, non-distended with normoactive bowel sounds. No hepatosplenomegaly or masses. No rebound/guarding. No CVA tenderness. No hernias.  Genitourinary:  External genitalia without lesions. Vaginal mucosa pink.No discharge present. Cervix pink and without discharge. No cervical tenderness.Normal uterus size. No adnexal mass or tenderness.  Musculoskeletal: Full range of motion and 5/5 strength throughout. Without swelling, atrophy, tenderness, crepitus, or warmth. Extremities without clubbing, cyanosis, or edema.  Skin: Warm and moist without erythema, ecchymosis, wounds, or rash. Neuro: A+Ox3. CN II-XII grossly intact. Moves all extremities spontaneously. Full sensation throughout. Normal gait. DTR 2+ throughout upper and lower extremities. Psych:  Responds to questions appropriately with a normal affect.   Assessment/Plan:  65 y.o. y/o female here for CPE  1. Visit for preventive health examination  A. Screening Labs: - CBC with Differential/Platelet - COMPLETE METABOLIC PANEL WITH GFR - Lipid panel - TSH - Vit D  25 hydroxy (rtn osteoporosis monitoring)  B. Pap - PAP smear was performed 03/10/2015. Cytology was negative. HPV was negative.   C. Screening Mammogram: She had  mammogram May 02, 2015----Negative   D. DEXA/BMD:  I have received her records from her previous primary care physician. We'll review those records to see if she has had a bone density scan. From Prior Records: 06/18/2013 she did have a visit to discuss results of DEXA. Do not see any documentation regarding the T score an actual findings but it does say that for assessment with osteopenia and as the plan was to increase calcium to twice a day with meals and increase vitamin D to 2000 units daily weightbearing exercise and reevaluate 2 years Later, when reviewing records,  I did find DEXA scan report from 2 different dates 06/30/2006 T score  -1.6. Was to use calcium and vitamin D. 01/15/2009 T score -1.6 --Provider note at that time did note that this was showing the same as it had in 2008. -----At visit 03/15/16 I have placed an order for another DEXA scan to follow-up.---------   E. Colorectal Cancer Screening: She had colonoscopy March 2016. One small polyp. Otherwise negative. Repeat 5  years.  F. Immunizations:  Influenza:  Given here 03/10/2015. She has already received flu vaccine earlier this month 02/2016 Tetanus:  Given here 03/10/2015 Pneumococcal: Now that she is age 70---start Pneumonia Vaccine---Give Prevnar 49 today--03/15/2016-----Give Pneumovax in 6-12 months-----------GIVE PNEUMOVAX 23 AT NEXT CPE------------------ Zostavax: She received this 04/23/2013  2. Osteopenia, unspecified location - DG Bone Density; Future - VITAMIN D 25 Hydroxy (Vit-D Deficiency, Fractures)    3. History of skin cancer - Ambulatory referral to Dermatology  4. Vulvar atrophy Continue Premarin cream  5. Abnormal cervical Papanicolaou smear, unspecified abnormal pap finding PER PT: She had an abnormal Pap smear was told to follow-up 6 months.  She had this follow-up repeat Pap smear 07/2014. That was normal.  She was scheduled for follow-up with gynecology in October. She will follow-up here for CPE with Pap smear in October. See # 1 above. Pap smear repeated today 03/10/15. If this one is abnormal, will have her follow-up with gynecologist. I REVIEWED ALL RECORDS FROM PRIOR PROVIDER:  Pap smear 12/28/2007 negative for intraparenchymal epithelial lesion or malignancy. No HPV test done. Pap smear 07/03/07 negative for intraepithelial lesion and malignancy. No HPV co-test performed. Pap smear 06/30/06 negative for intraepithelial lesion and malignancy. No HPV co-testing performed. Pap smear 05/18/2005 negative for intraepithelial lesion or malignancy. No HPV co-testing performed. Pap smear 05/13/04 negative for intraepithelial lesion  and malignancy. No HPV co-testing performed. Pap smear 01/15/2009 epithelial cell abnormality. Atypical squamous cells of undetermined significance. No HPV co-testing performed.    Signed, [address] [place], [place], [facility] 03/15/2016 8:11 AM

## 2016-03-16 LAB — VITAMIN D 25 HYDROXY (VIT D DEFICIENCY, FRACTURES): VIT D 25 HYDROXY: 37 ng/mL (ref 30–100)

## 2016-03-17 ENCOUNTER — Other Ambulatory Visit: Payer: Self-pay | Admitting: Physician Assistant

## 2016-03-17 DIAGNOSIS — Z1231 Encounter for screening mammogram for malignant neoplasm of breast: Secondary | ICD-10-CM

## 2016-03-18 DIAGNOSIS — L309 Dermatitis, unspecified: Secondary | ICD-10-CM | POA: Diagnosis not present

## 2016-04-13 ENCOUNTER — Ambulatory Visit
Admission: RE | Admit: 2016-04-13 | Discharge: 2016-04-13 | Disposition: A | Discharge status: Home / Self Care | Payer: Medicare Other | Source: Ambulatory Visit | Attending: Physician Assistant | Admitting: Physician Assistant

## 2016-04-13 ENCOUNTER — Ambulatory Visit: Payer: Medicare Other

## 2016-04-13 DIAGNOSIS — Z0000 Encounter for general adult medical examination without abnormal findings: Secondary | ICD-10-CM

## 2016-04-13 DIAGNOSIS — M8580 Other specified disorders of bone density and structure, unspecified site: Secondary | ICD-10-CM

## 2016-04-13 DIAGNOSIS — M810 Age-related osteoporosis without current pathological fracture: Secondary | ICD-10-CM | POA: Diagnosis not present

## 2016-04-13 DIAGNOSIS — Z780 Asymptomatic menopausal state: Secondary | ICD-10-CM | POA: Diagnosis not present

## 2016-04-15 ENCOUNTER — Telehealth: Payer: Self-pay

## 2016-04-15 DIAGNOSIS — M810 Age-related osteoporosis without current pathological fracture: Secondary | ICD-10-CM

## 2016-04-15 HISTORY — DX: Age-related osteoporosis without current pathological fracture: M81.0

## 2016-04-15 MED ORDER — ALENDRONATE SODIUM 70 MG PO TABS: 70.0000 mg | ORAL_TABLET | ORAL | 3 refills | Status: DC

## 2016-04-15 NOTE — Telephone Encounter (Signed)
Fosamax added to medication list  

## 2016-04-20 ENCOUNTER — Encounter: Payer: Self-pay | Admitting: Physician Assistant

## 2016-04-21 ENCOUNTER — Encounter: Payer: Self-pay | Admitting: Physician Assistant

## 2016-04-22 ENCOUNTER — Telehealth: Payer: Self-pay

## 2016-04-22 DIAGNOSIS — L219 Seborrheic dermatitis, unspecified: Secondary | ICD-10-CM | POA: Diagnosis not present

## 2016-04-22 DIAGNOSIS — L570 Actinic keratosis: Secondary | ICD-10-CM | POA: Diagnosis not present

## 2016-04-22 DIAGNOSIS — L821 Other seborrheic keratosis: Secondary | ICD-10-CM | POA: Diagnosis not present

## 2016-04-22 NOTE — Telephone Encounter (Signed)
Pt called to confirm she is to be taking the calcium as well as the vit D while taking Fosamax.  Explained to pt she was to add the fosamax and continue the calcium and vit D.  Pt understands she is to take all three

## 2016-05-19 ENCOUNTER — Ambulatory Visit
Admission: RE | Admit: 2016-05-19 | Discharge: 2016-05-19 | Disposition: A | Discharge status: Home / Self Care | Payer: Medicare Other | Source: Ambulatory Visit | Attending: Physician Assistant | Admitting: Physician Assistant

## 2016-05-19 DIAGNOSIS — Z1231 Encounter for screening mammogram for malignant neoplasm of breast: Secondary | ICD-10-CM

## 2016-07-22 DIAGNOSIS — D225 Melanocytic nevi of trunk: Secondary | ICD-10-CM | POA: Diagnosis not present

## 2016-07-22 DIAGNOSIS — L821 Other seborrheic keratosis: Secondary | ICD-10-CM | POA: Diagnosis not present

## 2016-07-22 DIAGNOSIS — L814 Other melanin hyperpigmentation: Secondary | ICD-10-CM | POA: Diagnosis not present

## 2016-07-22 DIAGNOSIS — L820 Inflamed seborrheic keratosis: Secondary | ICD-10-CM | POA: Diagnosis not present

## 2016-07-22 DIAGNOSIS — L309 Dermatitis, unspecified: Secondary | ICD-10-CM | POA: Diagnosis not present

## 2017-03-16 ENCOUNTER — Ambulatory Visit (INDEPENDENT_AMBULATORY_CARE_PROVIDER_SITE_OTHER): Payer: Medicare Other | Admitting: Physician Assistant

## 2017-03-16 ENCOUNTER — Encounter: Payer: Self-pay | Admitting: Physician Assistant

## 2017-03-16 VITALS — BP 118/78 | HR 66 | Temp 97.9°F | Resp 16 | Ht 62.0 in | Wt 130.0 lb

## 2017-03-16 DIAGNOSIS — Z1159 Encounter for screening for other viral diseases: Secondary | ICD-10-CM

## 2017-03-16 DIAGNOSIS — Z0000 Encounter for general adult medical examination without abnormal findings: Secondary | ICD-10-CM

## 2017-03-16 DIAGNOSIS — Z23 Encounter for immunization: Secondary | ICD-10-CM

## 2017-03-16 DIAGNOSIS — R6889 Other general symptoms and signs: Secondary | ICD-10-CM | POA: Diagnosis not present

## 2017-03-16 DIAGNOSIS — Z13220 Encounter for screening for lipoid disorders: Secondary | ICD-10-CM | POA: Diagnosis not present

## 2017-03-16 DIAGNOSIS — N905 Atrophy of vulva: Secondary | ICD-10-CM

## 2017-03-16 DIAGNOSIS — B009 Herpesviral infection, unspecified: Secondary | ICD-10-CM | POA: Diagnosis not present

## 2017-03-16 DIAGNOSIS — E559 Vitamin D deficiency, unspecified: Secondary | ICD-10-CM | POA: Diagnosis not present

## 2017-03-16 DIAGNOSIS — Z1329 Encounter for screening for other suspected endocrine disorder: Secondary | ICD-10-CM | POA: Diagnosis not present

## 2017-03-16 DIAGNOSIS — M810 Age-related osteoporosis without current pathological fracture: Secondary | ICD-10-CM | POA: Diagnosis not present

## 2017-03-16 MED ORDER — ESTRADIOL 0.1 MG/GM VA CREA: 1.0000 | TOPICAL_CREAM | Freq: Every day | VAGINAL | 12 refills | Status: DC

## 2017-03-16 MED ORDER — ALENDRONATE SODIUM 70 MG PO TABS: 70.0000 mg | ORAL_TABLET | ORAL | 3 refills | Status: DC

## 2017-03-16 MED ORDER — VALACYCLOVIR HCL 1 G PO TABS: 2000.0000 mg | ORAL_TABLET | Freq: Two times a day (BID) | ORAL | 3 refills | Status: AC

## 2017-03-16 NOTE — Progress Notes (Signed)
Patient ID: Terri Alexander MRN: [id number], DOB: [birth year], 66 y.o. Date of Encounter: 03/16/2017,   Chief Complaint: Physical (CPE)  Subjective:   Patient presents for Medicare Annual/Subsequent preventive examination.   Review Past Medical/Family/Social: All of this information is reviewed today.  Risk Factors  Current exercise habits: She is very active. She walks on treadmill 3 days per week in addition to living active lifestyle Dietary issues discussed: She eats a healthy diet low saturated fat low-carb diet.  Cardiac risk factors: Age  Depression Screen  (Note: if answer to either of the following is "Yes", a more complete depression screening is indicated)  Over the past two weeks, have you felt down, depressed or hopeless? No Over the past two weeks, have you felt little interest or pleasure in doing things? No Have you lost interest or pleasure in daily life? No Do you often feel hopeless? No Do you cry easily over simple problems? No   Activities of Daily Living  In your present state of health, do you have any difficulty performing the following activities?:  Driving? No  Managing money? No  Feeding yourself? No  Getting from bed to chair? No  Climbing a flight of stairs? No  Preparing food and eating?: No  Bathing or showering? No  Getting dressed: No  Getting to the toilet? No  Using the toilet:No  Moving around from place to place: No  In the past year have you fallen or had a near fall?:No  Are you sexually active? No  Do you have more than one partner? No   Hearing Difficulties: No  Do you often ask people to speak up or repeat themselves? No  Do you experience ringing or noises in your ears? No Do you have difficulty understanding soft or whispered voices? No  Do you feel that you have a problem with memory? No Do you often misplace items? No  Do you feel safe at home? Yes  Cognitive Testing  Alert? Yes Normal Appearance?Yes  Oriented to  person? Yes Place? Yes  Time? Yes  Recall of three objects? Yes  Can perform simple calculations? Yes  Displays appropriate judgment?Yes  Can read the correct time from a watch face?Yes   List the Names of Other Physician/Practitioners you currently use:  None  Indicate any recent Medical Services you may have received from other than [facility] providers in the past year (date may be approximate).  None Screening Tests / Date Colonoscopy                    ALL OF THIS INFORMATION IS DOCUMENTED BELOW Zostavax  Mammogram  Influenza Vaccine  Tetanus/tdap    Assessment:    Annual wellness medicare exam   Plan:    During the course of the visit the patient was educated and counseled about appropriate screening and preventive services including:  Screening mammography  Colorectal cancer screening  Shingles vaccine. Prescription given to that she can get the vaccine at the pharmacy or Medicare part D.  Screen + for depression. PHQ- 9 score of 12 (moderate depression). We discussed the options of counseling versus possibly a medication. I encouraged her strongly think about the counseling. She is going through some medical problems currently and her husband is as well Mrs. been very stressful for her. She says she will think about it. She does have Xanax to use as needed. Though she may benefit from an SSRI for her more depressive type symptoms but she  wants to hold off at this time.  I aksed her to please have her cardioloist send records since we have none on file.  Diet review for nutrition referral? Yes ____ Not Indicated __x__  Patient Instructions (the written plan) was given to the patient.  Medicare Attestation  I have personally reviewed:  The patient's medical and social history  Their use of alcohol, tobacco or illicit drugs  Their current medications and supplements  The patient's functional ability including ADLs,fall risks, home safety risks, cognitive, and hearing and visual  impairment  Diet and physical activities  Evidence for depression or mood disorders  The patient's weight, height, BMI, and visual acuity have been recorded in the chart. I have made referrals, counseling, and provided education to the patient based on review of the above and I have provided the patient with a written personalized care plan for preventive services.       HPI: 66 y.o. y/o female  here for CPE.   Expand All Collapse All                SHE GOES BY THE NAME "Terri Alexander"------NOT Terri Alexander  THE FOLLOWING IS COPIED FROM HER OV NOTE 11/07/2014: HPI: 66 y.o. year old white female presents with her husband for visit today.  They are both here to establish care as new patients to our practice.  They recently moved here from [place] and 08/2014.  She went to [facility]. She states that she would go there once a year for complete physical exam. Otherwise would just go there PRN. Says her last complete physical exam there was 01/2014.  Says that she also was seeing a Editor, commissioning.  Says that she recently had an abnormal Pap smear and they told her to have a repeat Pap smear in 6 months.  She went for that 6 month follow-up in 07/2014. Pap Smear was normal but they did schedule her follow-up visit there for October. Says that she has had an abnormal Pap smear once or twice over the years. However says that she has never had to have any type of procedures done--- no LEEP procedures etc. Discussed doing a referral to gynecologist to have follow-up with the GYN. Says that she has had no other GYN history.  She would like to do her follow-up Pap smear here when she comes for her physical here in October. Says that if she ends up having any further problems then she would be glad to go to the gynecologist if necessary.  Says that she uses Premarin cream "for dryness". Is on no other prescription medications.  She has no complaints or concerns today.  Just here to establish care.       --03/10/2015: She presents for complete physical exam today. She has no specific complaints or concerns.  Their son went to college at [employer] and stayed in the [place] area since then. He is now married with 1 child-- a 80-year-old.  This is why patient and her husband moved here is to be with their son and grandchild. Pt states that she stays quite busy running around after this 3-year-old. States that she also tries to get out and walk 3 days a week.  ----I RECEIVED RECORDS FROM HER PRIOR PHYSICIAN---I REVIEWED AL OF THESE RECORDS-----ALL PERTINENT INFO TRANSFERRED INTO THIS NOTE---RECORDS SENT TO SCAN---------------  01/15/2009----Pap Smear---ASCUS. (NO HPV Cotest was performed) Last complete physical exam was 03/05/2014  06/18/2013 she did have a visit to discuss results of DEXA. Do  not see any documentation regarding the T score an actual findings but it does say that for assessment with osteopenia and as the plan was to increase calcium to twice a day with meals and increase vitamin D to 2000 units daily weightbearing exercise and reevaluate 2 years  03/15/2016: She reports that in the past she has had multiple skin cancers. She reports today that she has been noticing some areas of concern. She has no other complaints or concerns today.  03/16/2017: She reports that she gets cold sores at least twice per year. Says that they will happen anytime she gets a cold or has a lot of stress. Is asking if there is any medicine she can use for this.  She states that this past year her mom sad a lot of health issues ad so she has been kept very busy with this. States that her mom is 93 years old. States that this past June the doctors honestly did not think she was going to survive, but she pulled through. Mom is in [place], [place]. Patient did have her come stay with her for a while but otherwise has been back and forth to [place] quite a bit. Also  has continued to be active with her little grandson. No other updates or concerns to address today. Reviewed that she had DEXA scan 04/13/16 that showed osteoporosis. She states that she did at the Fosamax and is taking this as directed. Also is taking the calcium and vitamin D daily.  Review of Systems: Consitutional: No fever, chills, fatigue, night sweats, lymphadenopathy. No significant/unexplained weight changes. Eyes: No visual changes, eye redness, or discharge. ENT/Mouth: No ear pain, sore throat, nasal drainage, or sinus pain. Cardiovascular: No chest pressure,heaviness, tightness or squeezing, even with exertion. No increased shortness of breath or dyspnea on exertion.No palpitations, edema, orthopnea, PND. Respiratory: No cough, hemoptysis, SOB, or wheezing. Gastrointestinal: No anorexia, dysphagia, reflux, pain, nausea, vomiting, hematemesis, diarrhea, constipation, BRBPR, or melena. Breast: No mass, nodules, bulging, or retraction. No skin changes or inflammation. No nipple discharge. No lymphadenopathy. Genitourinary: No dysuria, hematuria, incontinence, vaginal discharge, pruritis, burning, abnormal bleeding, or pain. Musculoskeletal: No decreased ROM, No joint pain or swelling. No significant pain in neck, back, or extremities. Skin: --See HPI--- Neurological: No headache, dizziness, syncope, seizures, tremors, memory loss, coordination problems, or paresthesias. Psychological: No anxiety, depression, hallucinations, SI/HI. Endocrine: No polydipsia, polyphagia, polyuria, or known diabetes.No increased fatigue. No palpitations/rapid heart rate. No significant/unexplained weight change. All other systems were reviewed and are otherwise negative.  Past Medical History:  Diagnosis Date  . Allergy   . Blood transfusion without reported diagnosis    53 months old  . Cancer [facility]) 2013   Skin cancer  . Osteoporosis 04/15/2016     Past Surgical History:  Procedure Laterality Date   . EYE SURGERY    . nissan fundiplication  05/17/9145    Home Meds:  Outpatient Medications Prior to Visit  Medication Sig Dispense Refill  . alendronate (FOSAMAX) 70 MG tablet Take 1 tablet (70 mg total) by mouth every 7 (seven) days. Take with a full glass of water on an empty stomach. 12 tablet 3  . aspirin 81 MG tablet Take 81 mg by mouth daily.    . Calcium Carb-Cholecalciferol (CALCIUM 600 + D PO) Take 600 mg by mouth once.    . Cranberry-Cholecalciferol (SUPER CRANBERRY/VITAMIN D3) 4200-500 MG-UNIT CAPS Take by mouth.    . estradiol (ESTRACE) 0.1 MG/GM vaginal cream Place 1 Applicatorful vaginally  at bedtime. 42.5 g 12  . Methylcellulose, Laxative, (FIBER THERAPY PO) Take by mouth.    . Multiple Vitamin (MULTIVITAMIN) tablet Take 1 tablet by mouth daily.    . Vitamin D, Cholecalciferol, 400 UNITS CAPS Take 400 Units by mouth once.    . loratadine (ALLERGY RELIEF) 10 MG tablet Take 10 mg by mouth daily.    . vitamin E 400 UNIT capsule Take 400 Units by mouth daily.     No facility-administered medications prior to visit.     Allergies: No Known Allergies  Social History   Social History  . Marital status: Married    Spouse name: N/A  . Number of children: N/A  . Years of education: N/A   Occupational History  . Not on file.   Social History Main Topics  . Smoking status: Never Smoker  . Smokeless tobacco: Never Used  . Alcohol use 0.6 oz/week    1 Glasses of wine per week  . Drug use: No  . Sexual activity: Not Currently   Other Topics Concern  . Not on file   Social History Narrative  . No narrative on file    Family History  Problem Relation Age of Onset  . Arthritis Mother   . Heart disease Mother   . Vision loss Mother   . Stroke Mother   . Depression Sister   . Cancer Maternal Grandmother   . Cancer Maternal Grandfather   . Early death Maternal Grandfather   . Heart disease Maternal Grandfather   . Early death Paternal Grandfather   . Heart  disease Paternal Grandfather   . Depression Sister   . Diabetes Other        Juvenile    Physical Exam: Blood pressure 118/78, pulse 66, temperature 97.9 F (36.6 C), temperature source Oral, resp. rate 16, height 5\' 2"  (1.575 m), weight 59 kg (130 lb), SpO2 98 %., Body mass index is 23.78 kg/m. General: Petite, Well developed, well nourished,WF. Appears in no acute distress. HEENT: Normocephalic, atraumatic. Conjunctiva pink, sclera non-icteric. Pupils 2 mm constricting to 1 mm, round, regular, and equally reactive to light and accomodation. EOMI. Internal auditory canal clear. TMs with good cone of light and without pathology. Nasal mucosa pink. Nares are without discharge. No sinus tenderness. Oral mucosa pink.  Pharynx without exudate.   Neck: Supple. Trachea midline. No thyromegaly. Full ROM. No lymphadenopathy.No Carotid Bruits. Lungs: Clear to auscultation bilaterally without wheezes, rales, or rhonchi. Breathing is of normal effort and unlabored. Cardiovascular: RRR with S1 S2. No murmurs, rubs, or gallops. Distal pulses 2+ symmetrically. No carotid or abdominal bruits. Breast: Symmetrical. No masses. Nipples without discharge. Abdomen: Soft, non-tender, non-distended with normoactive bowel sounds. No hepatosplenomegaly or masses. No rebound/guarding. No CVA tenderness. No hernias.  Genitourinary:  She defers pelvic exam today. Musculoskeletal: Full range of motion and 5/5 strength throughout.  Skin: Warm and moist without erythema, ecchymosis, wounds, or rash. Neuro: A+Ox3. CN II-XII grossly intact. Moves all extremities spontaneously. Full sensation throughout. Normal gait.  Psych:  Responds to questions appropriately with a normal affect.   Assessment/Plan:  66 y.o. y/o female here for CPE  1. Visit for preventive health examination  A. Screening Labs: - CBC with Differential/Platelet - COMPLETE METABOLIC PANEL WITH GFR - Lipid panel - TSH - Vit D  25 hydroxy (rtn  osteoporosis monitoring)  B. Pap - PAP smear was performed 03/10/2015. Cytology was negative. HPV was negative.   C. Screening Mammogram: She had  mammogram  1/3/ 2018----Negative   D. DEXA/BMD:  I have received her records from her previous primary care physician. We'll review those records to see if she has had a bone density scan. From Prior Records: 06/18/2013 she did have a visit to discuss results of DEXA. Do not see any documentation regarding the T score an actual findings but it does say that for assessment with osteopenia and as the plan was to increase calcium to twice a day with meals and increase vitamin D to 2000 units daily weightbearing exercise and reevaluate 2 years Later, when reviewing records,  I did find DEXA scan report from 2 different dates 06/30/2006 T score -1.6. Was to use calcium and vitamin D. 01/15/2009 T score -1.6 --Provider note at that time did note that this was showing the same as it had in 2008. -----At visit 03/15/16 I have placed an order for another DEXA scan to follow-up.--------- DEXA scan performed 04/13/2016 showed T score -2.6/osteoporosis. At that time had her add Fosamax calcium and vitamin D. 03/16/17--- she reports that she is taking the Fosamax, calcium, and vitamin D all as directed.  E. Colorectal Cancer Screening: She had colonoscopy March 2016. One small polyp. Otherwise negative. Repeat 5 years.  F. Immunizations:  Influenza:  Given here 03/10/2015. She has already received flu vaccine earlier this month 02/2016 Tetanus:  Given here 03/10/2015 Pneumococcal: Now that she is age 43---start Pneumonia Vaccine---Give Prevnar 13 today--03/15/2016-----Give Pneumovax in 6-12 months-   --------- 03/16/2017--------------GIVE PNEUMOVAX 23 today Zostavax: She received this 04/23/2013   3. History of skin cancer 2017--Referred to Derm--- Ambulatory referral to Dermatology  4. Vulvar atrophy 03/16/2017: Continue Premarin cream - estradiol  (ESTRACE) 0.1 MG/GM vaginal cream; Place 1 Applicatorful vaginally at bedtime.  Dispense: 42.5 g; Refill: 12  Need for hepatitis C screening test 03/16/2017: - Hepatitis C antibody  Age-related osteoporosis without current pathological fracture 03/16/3017: Continue the Fosamax, calcium, vitamin D. - VITAMIN D 25 Hydroxy (Vit-D Deficiency, Fractures)  HSV-1 (herpes simplex virus 1) infection 03/16/2017: She reports that the cold sores she gets, in the same area currently. States that they are very tingly and painful. These are caused by virus and that this medicine works best if taken at first sign of recurrence. Take 2 every 12 hours for total of 2 doses per episode. - valACYclovir (VALTREX) 1000 MG tablet; Take 2 tablets (2,000 mg total) by mouth 2 (two) times daily. Take total of 2 doses-- as needed---for each flare/episode.  Dispense: 30 tablet; Refill: 3   H/O Abnormal cervical Papanicolaou smear, unspecified abnormal pap finding PER PT: She had an abnormal Pap smear was told to follow-up 6 months.  She had this follow-up repeat Pap smear 07/2014. That was normal.  She was scheduled for follow-up with gynecology in October. She will follow-up here for CPE with Pap smear in October. See # 1 above. Pap smear repeated today 03/10/15. If this one is abnormal, will have her follow-up with gynecologist. I REVIEWED ALL RECORDS FROM PRIOR PROVIDER:  Pap smear 12/28/2007 negative for intraparenchymal epithelial lesion or malignancy. No HPV test done. Pap smear 07/03/07 negative for intraepithelial lesion and malignancy. No HPV co-test performed. Pap smear 06/30/06 negative for intraepithelial lesion and malignancy. No HPV co-testing performed. Pap smear 05/18/2005 negative for intraepithelial lesion or malignancy. No HPV co-testing performed. Pap smear 05/13/04 negative for intraepithelial lesion and malignancy. No HPV co-testing performed. Pap smear 01/15/2009 epithelial cell abnormality. Atypical  squamous cells of undetermined significance. No HPV co-testing performed.  Signed, [address] [place], [place], [facility] 03/16/2017 8:46 AM

## 2017-03-16 NOTE — Addendum Note (Signed)
Addended by: Vonna Kotyk A on: 03/16/2017 09:53 AM   Modules accepted: Orders

## 2017-03-17 LAB — CBC WITH DIFFERENTIAL/PLATELET
Basophils Absolute: 60 cells/uL (ref 0–200)
Basophils Relative: 0.9 %
EOS ABS: 0 {cells}/uL — AB (ref 15–500)
EOS PCT: 0 %
HCT: 41.3 % (ref 35.0–45.0)
Hemoglobin: 13.7 g/dL (ref 11.7–15.5)
LYMPHS ABS: 1856 {cells}/uL (ref 850–3900)
MCH: 30 pg (ref 27.0–33.0)
MCHC: 33.2 g/dL (ref 32.0–36.0)
MCV: 90.6 fL (ref 80.0–100.0)
MONOS PCT: 10.8 %
MPV: 10.9 fL (ref 7.5–12.5)
NEUTROS ABS: 4060 {cells}/uL (ref 1500–7800)
Neutrophils Relative %: 60.6 %
PLATELETS: 318 10*3/uL (ref 140–400)
RBC: 4.56 10*6/uL (ref 3.80–5.10)
RDW: 12.7 % (ref 11.0–15.0)
Total Lymphocyte: 27.7 %
WBC mixed population: 724 cells/uL (ref 200–950)
WBC: 6.7 10*3/uL (ref 3.8–10.8)

## 2017-03-17 LAB — LIPID PANEL
CHOLESTEROL: 215 mg/dL — AB (ref <200)
HDL: 69 mg/dL (ref >50)
LDL CHOLESTEROL (CALC): 119 mg/dL — AB
Non-HDL Cholesterol (Calc): 146 mg/dL (calc) — ABNORMAL HIGH (ref <130)
Total CHOL/HDL Ratio: 3.1 (calc) (ref <5.0)
Triglycerides: 154 mg/dL — ABNORMAL HIGH (ref <150)

## 2017-03-17 LAB — COMPLETE METABOLIC PANEL WITH GFR
AG Ratio: 1.6 (calc) (ref 1.0–2.5)
ALKALINE PHOSPHATASE (APISO): 57 U/L (ref 33–130)
ALT: 12 U/L (ref 6–29)
AST: 15 U/L (ref 10–35)
Albumin: 4.2 g/dL (ref 3.6–5.1)
BUN: 21 mg/dL (ref 7–25)
CO2: 27 mmol/L (ref 20–32)
Calcium: 8.9 mg/dL (ref 8.6–10.4)
Chloride: 105 mmol/L (ref 98–110)
Creat: 0.56 mg/dL (ref 0.50–0.99)
GFR, Est African American: 113 mL/min/{1.73_m2} (ref >=60)
GFR, Est Non African American: 97 mL/min/{1.73_m2} (ref >=60)
GLUCOSE: 90 mg/dL (ref 65–99)
Globulin: 2.6 g/dL (calc) (ref 1.9–3.7)
Potassium: 4.7 mmol/L (ref 3.5–5.3)
SODIUM: 142 mmol/L (ref 135–146)
Total Bilirubin: 0.5 mg/dL (ref 0.2–1.2)
Total Protein: 6.8 g/dL (ref 6.1–8.1)

## 2017-03-17 LAB — TSH: TSH: 2.23 mIU/L (ref 0.40–4.50)

## 2017-03-17 LAB — VITAMIN D 25 HYDROXY (VIT D DEFICIENCY, FRACTURES): VIT D 25 HYDROXY: 40 ng/mL (ref 30–100)

## 2017-03-17 LAB — HEPATITIS C ANTIBODY
Hepatitis C Ab: NONREACTIVE
SIGNAL TO CUT-OFF: 0.01 (ref <1.00)

## 2017-06-01 ENCOUNTER — Other Ambulatory Visit: Payer: Self-pay | Admitting: Physician Assistant

## 2017-06-01 DIAGNOSIS — Z1231 Encounter for screening mammogram for malignant neoplasm of breast: Secondary | ICD-10-CM

## 2017-06-21 ENCOUNTER — Ambulatory Visit
Admission: RE | Admit: 2017-06-21 | Discharge: 2017-06-21 | Disposition: A | Discharge status: Home / Self Care | Payer: Federal, State, Local not specified - PPO | Source: Ambulatory Visit | Attending: Physician Assistant | Admitting: Physician Assistant

## 2017-06-21 DIAGNOSIS — Z1231 Encounter for screening mammogram for malignant neoplasm of breast: Secondary | ICD-10-CM

## 2017-06-29 DIAGNOSIS — L570 Actinic keratosis: Secondary | ICD-10-CM | POA: Diagnosis not present

## 2017-06-29 DIAGNOSIS — L821 Other seborrheic keratosis: Secondary | ICD-10-CM | POA: Diagnosis not present

## 2017-07-21 DIAGNOSIS — D485 Neoplasm of uncertain behavior of skin: Secondary | ICD-10-CM | POA: Diagnosis not present

## 2017-07-21 DIAGNOSIS — C4402 Squamous cell carcinoma of skin of lip: Secondary | ICD-10-CM | POA: Diagnosis not present

## 2017-08-01 DIAGNOSIS — C4402 Squamous cell carcinoma of skin of lip: Secondary | ICD-10-CM | POA: Diagnosis not present

## 2017-08-10 DIAGNOSIS — IMO0002 Reserved for concepts with insufficient information to code with codable children: Secondary | ICD-10-CM | POA: Insufficient documentation

## 2017-12-28 DIAGNOSIS — L570 Actinic keratosis: Secondary | ICD-10-CM | POA: Diagnosis not present

## 2017-12-28 DIAGNOSIS — L578 Other skin changes due to chronic exposure to nonionizing radiation: Secondary | ICD-10-CM | POA: Diagnosis not present

## 2017-12-28 DIAGNOSIS — L309 Dermatitis, unspecified: Secondary | ICD-10-CM | POA: Diagnosis not present

## 2017-12-28 DIAGNOSIS — D229 Melanocytic nevi, unspecified: Secondary | ICD-10-CM | POA: Diagnosis not present

## 2017-12-28 DIAGNOSIS — L821 Other seborrheic keratosis: Secondary | ICD-10-CM | POA: Diagnosis not present

## 2017-12-28 DIAGNOSIS — Z85828 Personal history of other malignant neoplasm of skin: Secondary | ICD-10-CM | POA: Diagnosis not present

## 2017-12-28 DIAGNOSIS — L814 Other melanin hyperpigmentation: Secondary | ICD-10-CM | POA: Diagnosis not present

## 2018-01-18 ENCOUNTER — Other Ambulatory Visit: Payer: Self-pay

## 2018-01-18 ENCOUNTER — Encounter (HOSPITAL_COMMUNITY): Payer: Self-pay

## 2018-01-18 ENCOUNTER — Ambulatory Visit (HOSPITAL_COMMUNITY)
Admission: RE | Admit: 2018-01-18 | Discharge: 2018-01-18 | Disposition: A | Discharge status: Home / Self Care | Payer: Medicare Other | Source: Ambulatory Visit | Attending: Physician Assistant | Admitting: Physician Assistant

## 2018-01-18 ENCOUNTER — Ambulatory Visit (INDEPENDENT_AMBULATORY_CARE_PROVIDER_SITE_OTHER): Payer: Medicare Other | Admitting: Physician Assistant

## 2018-01-18 ENCOUNTER — Encounter: Payer: Self-pay | Admitting: Physician Assistant

## 2018-01-18 VITALS — BP 108/62 | HR 83 | Temp 97.6°F | Resp 16 | Ht 62.0 in | Wt 125.4 lb

## 2018-01-18 DIAGNOSIS — R195 Other fecal abnormalities: Secondary | ICD-10-CM | POA: Diagnosis not present

## 2018-01-18 DIAGNOSIS — R1084 Generalized abdominal pain: Secondary | ICD-10-CM | POA: Insufficient documentation

## 2018-01-18 DIAGNOSIS — K5732 Diverticulitis of large intestine without perforation or abscess without bleeding: Secondary | ICD-10-CM | POA: Diagnosis not present

## 2018-01-18 DIAGNOSIS — N281 Cyst of kidney, acquired: Secondary | ICD-10-CM | POA: Diagnosis not present

## 2018-01-18 DIAGNOSIS — R8290 Unspecified abnormal findings in urine: Secondary | ICD-10-CM

## 2018-01-18 DIAGNOSIS — N200 Calculus of kidney: Secondary | ICD-10-CM | POA: Diagnosis not present

## 2018-01-18 DIAGNOSIS — D72829 Elevated white blood cell count, unspecified: Secondary | ICD-10-CM | POA: Diagnosis not present

## 2018-01-18 DIAGNOSIS — R1030 Lower abdominal pain, unspecified: Secondary | ICD-10-CM

## 2018-01-18 LAB — COMPLETE METABOLIC PANEL WITH GFR
AG Ratio: 1.7 (calc) (ref 1.0–2.5)
ALBUMIN MSPROF: 4.2 g/dL (ref 3.6–5.1)
ALT: 11 U/L (ref 6–29)
AST: 13 U/L (ref 10–35)
Alkaline phosphatase (APISO): 61 U/L (ref 33–130)
BILIRUBIN TOTAL: 0.8 mg/dL (ref 0.2–1.2)
BUN: 15 mg/dL (ref 7–25)
CHLORIDE: 101 mmol/L (ref 98–110)
CO2: 27 mmol/L (ref 20–32)
CREATININE: 0.77 mg/dL (ref 0.50–0.99)
Calcium: 9 mg/dL (ref 8.6–10.4)
GFR, Est African American: 93 mL/min/{1.73_m2} (ref >=60)
GFR, Est Non African American: 80 mL/min/{1.73_m2} (ref >=60)
Globulin: 2.5 g/dL (calc) (ref 1.9–3.7)
Glucose, Bld: 95 mg/dL (ref 65–99)
Potassium: 4.5 mmol/L (ref 3.5–5.3)
Sodium: 137 mmol/L (ref 135–146)
Total Protein: 6.7 g/dL (ref 6.1–8.1)

## 2018-01-18 LAB — CBC WITH DIFFERENTIAL/PLATELET
Basophils Absolute: 42 cells/uL (ref 0–200)
Basophils Relative: 0.3 %
Eosinophils Absolute: 182 cells/uL (ref 15–500)
Eosinophils Relative: 1.3 %
HCT: 39 % (ref 35.0–45.0)
Hemoglobin: 13.3 g/dL (ref 11.7–15.5)
Lymphs Abs: 1722 cells/uL (ref 850–3900)
MCH: 30 pg (ref 27.0–33.0)
MCHC: 34.1 g/dL (ref 32.0–36.0)
MCV: 87.8 fL (ref 80.0–100.0)
MPV: 10.8 fL (ref 7.5–12.5)
Monocytes Relative: 10.1 %
Neutro Abs: 10640 cells/uL — ABNORMAL HIGH (ref 1500–7800)
Neutrophils Relative %: 76 %
Platelets: 323 10*3/uL (ref 140–400)
RBC: 4.44 10*6/uL (ref 3.80–5.10)
RDW: 13 % (ref 11.0–15.0)
Total Lymphocyte: 12.3 %
WBC mixed population: 1414 cells/uL — ABNORMAL HIGH (ref 200–950)
WBC: 14 10*3/uL — ABNORMAL HIGH (ref 3.8–10.8)

## 2018-01-18 LAB — URINALYSIS, ROUTINE W REFLEX MICROSCOPIC
Bacteria, UA: NONE SEEN /HPF
Bilirubin Urine: NEGATIVE
Glucose, UA: NEGATIVE
Ketones, ur: NEGATIVE
Nitrite: NEGATIVE
Specific Gravity, Urine: 1.015 (ref 1.001–1.03)
pH: 6 (ref 5.0–8.0)

## 2018-01-18 LAB — MICROSCOPIC MESSAGE

## 2018-01-18 LAB — POCT I-STAT CREATININE: CREATININE: 0.6 mg/dL (ref 0.44–1.00)

## 2018-01-18 MED ORDER — METRONIDAZOLE 500 MG PO TABS: 500.0000 mg | ORAL_TABLET | Freq: Three times a day (TID) | ORAL | 0 refills | Status: DC

## 2018-01-18 MED ORDER — CIPROFLOXACIN HCL 500 MG PO TABS: 500.0000 mg | ORAL_TABLET | Freq: Two times a day (BID) | ORAL | 0 refills | Status: DC

## 2018-01-18 MED ORDER — IOHEXOL 300 MG/ML  SOLN
100.0000 mL | Freq: Once | INTRAMUSCULAR | Status: AC | PRN
  Administered 2018-01-18: 100 mL via INTRAVENOUS

## 2018-01-18 MED ORDER — METRONIDAZOLE 500 MG PO TABS: 500.0000 mg | ORAL_TABLET | Freq: Three times a day (TID) | ORAL | 0 refills | Status: AC

## 2018-01-18 NOTE — Addendum Note (Signed)
Addended by: Delsa Grana on: 01/18/2018 05:49 PM   Modules accepted: Orders

## 2018-01-18 NOTE — Progress Notes (Signed)
Patient ID: Terri Alexander MRN: [id number], DOB: 1951/03/24, 67 y.o. Date of Encounter: @DATE @  Chief Complaint:  Chief Complaint  Patient presents with  . Abdominal Pain    symptoms started in August   . Diarrhea  . Generalized Body Aches  . rectum pressure  . Back Pain    HPI: 67 y.o. year old female  presents with above.   She reports that on August 18 she had what she is pretty sure was a GI virus.   States that she had vomiting and diarrhea but both of these lasted less than 12 hours and then resolved.   However states that "that was the worst I have ever had  ".   Also states that she thinks that she may have developed hemorrhoids during that illness.   States that at that time she noticed something at her rectal area and she thinks it must have been a hemorrhoid but then "thinks that it has gone back" up into her rectal area now/resolved.  Reports that all of those symptoms had completely cleared up and resolved and she was feeling back to her normal state of health up until yesterday. Again, reports that those symptoms only lasted <12 hours. Since then has been in normal state of health for several weeks.  States that yesterday she started developing some "cramps" across bilateral low abdomen.   Also states that she had some loose stool yesterday.   States that it was not "major diarrhea "but just loose stool.   Says that she did dry heave once but had no vomiting.  Also has had some body aches.  States that she is continuing to have pain across both sides of her low abdomen. She has had no fevers.   Past Medical History:  Diagnosis Date  . Allergy   . Blood transfusion without reported diagnosis    68 months old  . Cancer [facility]) 2013   Skin cancer  . Osteoporosis 04/15/2016     Home Meds: Outpatient Medications Prior to Visit  Medication Sig Dispense Refill  . alendronate (FOSAMAX) 70 MG tablet Take 1 tablet (70 mg total) by mouth every 7 (seven) days. Take  with a full glass of water on an empty stomach. 12 tablet 3  . aspirin 81 MG tablet Take 81 mg by mouth daily.    . Calcium Carb-Cholecalciferol (CALCIUM 600 + D PO) Take 600 mg by mouth once.    . Cranberry-Cholecalciferol (SUPER CRANBERRY/VITAMIN D3) 4200-500 MG-UNIT CAPS Take by mouth.    . estradiol (ESTRACE) 0.1 MG/GM vaginal cream Place 1 Applicatorful vaginally at bedtime. 42.5 g 12  . Methylcellulose, Laxative, (FIBER THERAPY PO) Take by mouth.    . Multiple Vitamin (MULTIVITAMIN) tablet Take 1 tablet by mouth daily.    . Vitamin D, Cholecalciferol, 400 UNITS CAPS Take 400 Units by mouth once.     No facility-administered medications prior to visit.     Allergies: No Known Allergies  Social History   Socioeconomic History  . Marital status: Married    Spouse name: Not on file  . Number of children: Not on file  . Years of education: Not on file  . Highest education level: Not on file  Occupational History  . Not on file  Social Needs  . Financial resource strain: Not on file  . Food insecurity:    Worry: Not on file    Inability: Not on file  . Transportation needs:    Medical: Not on  file    Non-medical: Not on file  Tobacco Use  . Smoking status: Never Smoker  . Smokeless tobacco: Never Used  Substance and Sexual Activity  . Alcohol use: Yes    Alcohol/week: 1.0 standard drinks    Types: 1 Glasses of wine per week  . Drug use: No  . Sexual activity: Not Currently  Lifestyle  . Physical activity:    Days per week: Not on file    Minutes per session: Not on file  . Stress: Not on file  Relationships  . Social connections:    Talks on phone: Not on file    Gets together: Not on file    Attends religious service: Not on file    Active member of club or organization: Not on file    Attends meetings of clubs or organizations: Not on file    Relationship status: Not on file  . Intimate partner violence:    Fear of current or ex partner: Not on file     Emotionally abused: Not on file    Physically abused: Not on file    Forced sexual activity: Not on file  Other Topics Concern  . Not on file  Social History Narrative  . Not on file    Family History  Problem Relation Age of Onset  . Arthritis Mother   . Heart disease Mother   . Vision loss Mother   . Stroke Mother   . Depression Sister   . Cancer Maternal Grandmother   . Cancer Maternal Grandfather   . Early death Maternal Grandfather   . Heart disease Maternal Grandfather   . Early death Paternal Grandfather   . Heart disease Paternal Grandfather   . Depression Sister   . Diabetes Other        Juvenile     Review of Systems:  See HPI for pertinent ROS. All other ROS negative.    Physical Exam: Blood pressure 108/62, pulse 83, temperature 97.6 F (36.4 C), temperature source Oral, resp. rate 16, height 5\' 2"  (1.575 m), weight 56.9 kg, SpO2 98 %., Body mass index is 22.94 kg/m. General: WNWD WF. Appears in no acute distress. Neck: Supple. No thyromegaly. No lymphadenopathy. Lungs: Clear bilaterally to auscultation without wheezes, rales, or rhonchi. Breathing is unlabored. Heart: RRR with S1 S2. No murmurs, rubs, or gallops. Abdomen: Soft, non-tender, non-distended with normoactive bowel sounds. No hepatomegaly. No rebound/guarding. No obvious abdominal masses.  When I palpate across her low abdomen she states that this does not reproduce her symptoms.  States that this is not really tender with palpation. Anoscopy: No external hemorrhoid present.  No mass, fissure, or fistula appreciable on exam.  Findings consistent with internal hemorrhoids present. Musculoskeletal:  Strength and tone normal for age. Extremities/Skin: Warm and dry.  Neuro: Alert and oriented X 3. Moves all extremities spontaneously. Gait is normal. CNII-XII grossly in tact. Psych:  Responds to questions appropriately with a normal affect.     ASSESSMENT AND PLAN:  67 y.o. year old female with     1. Lower abdominal pain - CBC with Differential/Platelet - COMPLETE METABOLIC PANEL WITH GFR  2. Loose stools - CBC with Differential/Platelet - COMPLETE METABOLIC PANEL WITH GFR  3. Abnormal urine odor - Urinalysis, Routine w reflex microscopic  4. Generalized abdominal pain - CT ABDOMEN PELVIS W CONTRAST; Future  5. Leukocytosis, unspecified type  Urinalysis shows no significant abnormality. White Count is elevated at 14. Therefore will obtain CT abdomen/pelvis to further  evaluate.  This is ordered stat and is scheduled for later today.  I will follow-up with Mrs. Udall once I get this result.   Signed, [address] [place], [place], [facility] 01/18/2018 9:50 AM

## 2018-01-18 NOTE — Addendum Note (Signed)
Addended by: Vonna Kotyk A on: 01/18/2018 03:51 PM   Modules accepted: Orders

## 2018-01-18 NOTE — Addendum Note (Signed)
Addended by: Dena Billet B on: 01/18/2018 05:57 PM   Modules accepted: Orders

## 2018-01-18 NOTE — Progress Notes (Addendum)
Received page from radiology at 5:06 pm, CT abd and pelvis w/contrast pertinent for likely uncomplicated sigmoid diverticulitis.   Pt did not answer phone calls x2, LVM to return call.  Radiology attempted to locate her but could not.    Tx will be clear liquid diet for 2-3 days, flagyl and cipro. Follow up flex--sig recommended after tx. F/up with BMD in one week, hospital if pain worsens or unable to tolerate PO fluids/meds  Will place an additional f/up call to pt tonight.  Delsa Grana, PA-C   Meanwhile, I had called [facility] prior to leaving office. They did not have report at that time. I gave them my cell # to all directly with report. They did call me on my way home. I just called pt and spoke to her on the phone. She is instructed to take Cipro and Flagyl as directed, complete all 10 days and to cont clear liquid diet for 48 hours. Follow up if worsens.

## 2018-01-18 NOTE — Addendum Note (Signed)
Addended by: Vonna Kotyk A on: 01/18/2018 03:26 PM   Modules accepted: Orders

## 2018-01-31 DIAGNOSIS — H43813 Vitreous degeneration, bilateral: Secondary | ICD-10-CM | POA: Diagnosis not present

## 2018-01-31 DIAGNOSIS — H33322 Round hole, left eye: Secondary | ICD-10-CM | POA: Diagnosis not present

## 2018-01-31 DIAGNOSIS — H33321 Round hole, right eye: Secondary | ICD-10-CM | POA: Diagnosis not present

## 2018-02-01 DIAGNOSIS — H33321 Round hole, right eye: Secondary | ICD-10-CM | POA: Diagnosis not present

## 2018-02-08 ENCOUNTER — Ambulatory Visit (INDEPENDENT_AMBULATORY_CARE_PROVIDER_SITE_OTHER): Payer: Medicare Other

## 2018-02-08 DIAGNOSIS — Z23 Encounter for immunization: Secondary | ICD-10-CM

## 2018-02-08 NOTE — Progress Notes (Signed)
Patient was in office for flu vaccine. patient received vaccine in her l deltoid. Patient tolerated well

## 2018-03-03 DIAGNOSIS — H43813 Vitreous degeneration, bilateral: Secondary | ICD-10-CM | POA: Diagnosis not present

## 2018-03-20 ENCOUNTER — Encounter: Payer: Medicare Other | Admitting: Physician Assistant

## 2018-03-21 ENCOUNTER — Other Ambulatory Visit: Payer: Self-pay

## 2018-03-21 ENCOUNTER — Encounter: Payer: Self-pay | Admitting: Family Medicine

## 2018-03-21 ENCOUNTER — Ambulatory Visit (INDEPENDENT_AMBULATORY_CARE_PROVIDER_SITE_OTHER): Payer: Medicare Other | Admitting: Family Medicine

## 2018-03-21 VITALS — BP 110/68 | HR 80 | Temp 98.1°F | Resp 14 | Ht 62.0 in | Wt 126.0 lb

## 2018-03-21 DIAGNOSIS — E785 Hyperlipidemia, unspecified: Secondary | ICD-10-CM | POA: Diagnosis not present

## 2018-03-21 DIAGNOSIS — M810 Age-related osteoporosis without current pathological fracture: Secondary | ICD-10-CM | POA: Diagnosis not present

## 2018-03-21 DIAGNOSIS — Z780 Asymptomatic menopausal state: Secondary | ICD-10-CM | POA: Diagnosis not present

## 2018-03-21 DIAGNOSIS — Z23 Encounter for immunization: Secondary | ICD-10-CM

## 2018-03-21 DIAGNOSIS — Z1329 Encounter for screening for other suspected endocrine disorder: Secondary | ICD-10-CM

## 2018-03-21 DIAGNOSIS — Z0000 Encounter for general adult medical examination without abnormal findings: Secondary | ICD-10-CM

## 2018-03-21 MED ORDER — ZOSTER VAC RECOMB ADJUVANTED 50 MCG/0.5ML IM SUSR: 0.5000 mL | Freq: Once | INTRAMUSCULAR | 1 refills | Status: AC

## 2018-03-21 MED ORDER — ZOSTER VAC RECOMB ADJUVANTED 50 MCG/0.5ML IM SUSR: 0.5000 mL | Freq: Once | INTRAMUSCULAR | 1 refills | Status: DC

## 2018-03-21 MED ORDER — ALENDRONATE SODIUM 70 MG PO TABS: 70.0000 mg | ORAL_TABLET | ORAL | 3 refills | Status: DC

## 2018-03-21 NOTE — Progress Notes (Signed)
Subjective:   Terri Alexander is a 67 y.o. female who presents for Medicare Annual (Subsequent) preventive examination.  Review of Systems:  Review of Systems  Constitutional: Negative.  Negative for activity change, appetite change, fatigue and unexpected weight change.  HENT: Negative.   Eyes: Negative.   Respiratory: Negative.  Negative for shortness of breath and wheezing.   Cardiovascular: Negative.  Negative for chest pain, palpitations and leg swelling.  Gastrointestinal: Negative.  Negative for abdominal pain, blood in stool and constipation.  Endocrine: Negative.   Genitourinary: Negative.  Negative for difficulty urinating, enuresis, frequency and menstrual problem.  Musculoskeletal: Negative.  Negative for arthralgias, gait problem, joint swelling and myalgias.  Skin: Negative.  Negative for color change, pallor and rash.  Allergic/Immunologic: Negative.   Neurological: Negative.  Negative for syncope, weakness and light-headedness.  Hematological: Negative.   Psychiatric/Behavioral: Negative.  Negative for behavioral problems, confusion, decreased concentration, dysphoric mood, self-injury, sleep disturbance and suicidal ideas. The patient is not nervous/anxious and is not hyperactive.     Cardiac Risk Factors include: advanced age (>70men, >21 women)     Objective:     Vitals: BP 110/68   Pulse 80   Temp 98.1 F (36.7 C) (Oral)   Resp 14   Ht 5\' 2"  (1.575 m)   Wt 126 lb (57.2 kg)   SpO2 100%   BMI 23.05 kg/m   Body mass index is 23.05 kg/m.  No flowsheet data found.  Tobacco Social History   Tobacco Use  Smoking Status Never Smoker  Smokeless Tobacco Never Used     Counseling given: Not Answered   Clinical Intake:  Pre-visit preparation completed: Yes  Pain : No/denies pain     BMI - recorded: 229 Nutritional Status: BMI of 19-24  Normal Diabetes: No  How often do you need to have someone help you when you read instructions, pamphlets, or  other written materials from your doctor or pharmacy?: 1 - Never What is the last grade level you completed in school?: 12  Interpreter Needed?: No  Information entered by :: [employer],LPN  Past Medical History:  Diagnosis Date  . Allergy   . Blood transfusion without reported diagnosis    71 months old  . Cancer [facility]) 2013   Skin cancer  . Osteoporosis 04/15/2016   Past Surgical History:  Procedure Laterality Date  . EYE SURGERY    . nissan fundiplication  08/16/6832   Family History  Problem Relation Age of Onset  . Arthritis Mother   . Heart disease Mother   . Vision loss Mother   . Stroke Mother   . Depression Sister   . Cancer Maternal Grandmother   . Cancer Maternal Grandfather   . Early death Maternal Grandfather   . Heart disease Maternal Grandfather   . Early death Paternal Grandfather   . Heart disease Paternal Grandfather   . Depression Sister   . Diabetes Other        Juvenile   Social History   Socioeconomic History  . Marital status: Married    Spouse name: Not on file  . Number of children: Not on file  . Years of education: Not on file  . Highest education level: Not on file  Occupational History  . Not on file  Social Needs  . Financial resource strain: Not on file  . Food insecurity:    Worry: Not on file    Inability: Not on file  . Transportation needs:    Medical:  Not on file    Non-medical: Not on file  Tobacco Use  . Smoking status: Never Smoker  . Smokeless tobacco: Never Used  Substance and Sexual Activity  . Alcohol use: Yes    Alcohol/week: 1.0 standard drinks    Types: 1 Glasses of wine per week  . Drug use: No  . Sexual activity: Not Currently  Lifestyle  . Physical activity:    Days per week: Not on file    Minutes per session: Not on file  . Stress: Not on file  Relationships  . Social connections:    Talks on phone: Not on file    Gets together: Not on file    Attends religious service: Not on file    Active member of  club or organization: Not on file    Attends meetings of clubs or organizations: Not on file    Relationship status: Not on file  Other Topics Concern  . Not on file  Social History Narrative  . Not on file    Outpatient Encounter Medications as of 03/21/2018  Medication Sig  . alendronate (FOSAMAX) 70 MG tablet Take 1 tablet (70 mg total) by mouth every 7 (seven) days. Take with a full glass of water on an empty stomach.  Marland Kitchen aspirin 81 MG tablet Take 81 mg by mouth daily.  . Calcium Carb-Cholecalciferol (CALCIUM 600 + D PO) Take 600 mg by mouth once.  . chlorpheniramine (CHLOR-TRIMETON) 4 MG tablet Take 4 mg by mouth 2 (two) times daily as needed for allergies.  Marland Kitchen co-enzyme Q-10 30 MG capsule Take 30 mg by mouth 3 (three) times daily.  . Cranberry-Cholecalciferol (SUPER CRANBERRY/VITAMIN D3) 4200-500 MG-UNIT CAPS Take by mouth.  . LYSINE PO Take by mouth.  . Methylcellulose, Laxative, (FIBER THERAPY PO) Take by mouth.  . Multiple Vitamin (MULTIVITAMIN) tablet Take 1 tablet by mouth daily.  . vitamin C (ASCORBIC ACID) 500 MG tablet Take 500 mg by mouth daily.  . [DISCONTINUED] alendronate (FOSAMAX) 70 MG tablet Take 1 tablet (70 mg total) by mouth every 7 (seven) days. Take with a full glass of water on an empty stomach.  . [EXPIRED] Zoster Vaccine Adjuvanted [facility]) injection Inject 0.5 mLs into the muscle once for 1 dose. And repeat once more in 2-6 months  . [DISCONTINUED] ciprofloxacin (CIPRO) 500 MG tablet Take 1 tablet (500 mg total) by mouth 2 (two) times daily.  . [DISCONTINUED] estradiol (ESTRACE) 0.1 MG/GM vaginal cream Place 1 Applicatorful vaginally at bedtime.  . [DISCONTINUED] Vitamin D, Cholecalciferol, 400 UNITS CAPS Take 400 Units by mouth once.  . [DISCONTINUED] Zoster Vaccine Adjuvanted [facility]) injection Inject 0.5 mLs into the muscle once for 1 dose. Repeat immunization in 2-6 months.   No facility-administered encounter medications on file as of 03/21/2018.      Activities of Daily Living In your present state of health, do you have any difficulty performing the following activities: 03/21/2018  Hearing? Y  Vision? Y  Difficulty concentrating or making decisions? N  Walking or climbing stairs? N  Dressing or bathing? N  Doing errands, shopping? N  Preparing Food and eating ? N  Using the Toilet? N  In the past six months, have you accidently leaked urine? N  Do you have problems with loss of bowel control? N  Managing your Medications? N  Managing your Finances? N  Housekeeping or managing your Housekeeping? N  Some recent data might be hidden    Patient Care Team: Orlena Sheldon, [place]  as PCP - General (Physician Assistant)    Assessment:   This is a routine wellness examination for Mikel.  Exercise Activities and Dietary recommendations Current Exercise Habits: Structured exercise class, Type of exercise: stretching;treadmill, Time (Minutes): 35, Frequency (Times/Week): 2, Weekly Exercise (Minutes/Week): 70, Intensity: Moderate  Goals   None     Fall Risk Fall Risk  03/21/2018 03/16/2017  Falls in the past year? 0 No  Follow up Falls evaluation completed -   Is the patient's home free of loose throw rugs in walkways, pet beds, electrical cords, etc?   yes      Grab bars in the bathroom? yes      Handrails on the stairs?   yes      Adequate lighting?   yes   Depression Screen PHQ 2/9 Scores 03/21/2018 03/16/2017 03/15/2016  PHQ - 2 Score 0 0 0  PHQ- 9 Score - - 0     Cognitive Function Cognitive Testing   Alert? Yes  Normal Appearance?Yes  Oriented to person? Yes  Place? Yes  Time? Yes  Recall of three objects? Yes  Can perform simple calculations? Yes  Displays appropriate judgment?Yes  Can read the correct time from a watch face?Yes          Immunization History  Administered Date(s) Administered  . Influenza, High Dose Seasonal PF 02/23/2016, 01/05/2017  . Influenza,inj,Quad PF,6+ Mos 03/10/2015,  02/08/2018  . Influenza-Unspecified 02/08/2018  . Pneumococcal Conjugate-13 03/15/2016  . Pneumococcal Polysaccharide-23 03/16/2017  . Tdap 03/10/2015  . Zoster 04/23/2013    Qualifies for Shingles Vaccine?  Completed zoster, given information about Shingrix was encouraged to get from pharmacy.  Screening Tests Health Maintenance  Topic Date Due  . MAMMOGRAM  06/22/2019  . COLONOSCOPY  08/25/2024  . TETANUS/TDAP  03/09/2025  . INFLUENZA VACCINE  Completed  . DEXA SCAN  Completed  . Hepatitis C Screening  Completed  . PNA vac Low Risk Adult  Completed    Cancer Screenings: Lung: Low Dose CT Chest recommended if Age 2-80 years, 30 pack-year currently smoking OR have quit w/in 15years. Patient does not qualify. Breast:  Up to date on Mammogram? Yes   Up to date of Bone Density/Dexa? No due end of Nov - ordered Colorectal: utd  Additional Screenings: : Hepatitis C Screening: done     Plan:   67 year old female here for Medicare well visit  Problem List Items Addressed This Visit      Musculoskeletal and Integument   Osteoporosis    No side effects from Fosamax, no dysphasia, no choking episodes Supplement with Vit D and calcium - dosing reviewed and printed for pt, encouraged weight bearing exercise activity ie light lifting to help strengthen bones      Relevant Medications   alendronate (FOSAMAX) 70 MG tablet   Other Relevant Orders   DG Bone Density     Other   Dyslipidemia    hx of, tx with diet, repeat FLP      Relevant Orders   CBC with Differential/Platelet (Completed)   COMPLETE METABOLIC PANEL WITH GFR (Completed)   Lipid panel (Completed)   Postmenopausal estrogen deficiency   Relevant Orders   DG Bone Density    Other Visit Diagnoses    Medicare annual wellness visit, subsequent    -  Primary   Relevant Orders   TSH (Completed)   CBC with Differential/Platelet (Completed)   COMPLETE METABOLIC PANEL WITH GFR (Completed)   Lipid panel  (Completed)   Need  for immunization against influenza       Screening for thyroid disorder       Relevant Orders   TSH (Completed)   Need for shingles vaccine    -Shingrix sent to pharmacy       I have personally reviewed and noted the following in the patient's chart:   . Medical and social history . Use of alcohol, tobacco or illicit drugs  . Current medications and supplements . Functional ability and status . Nutritional status . Physical activity . Advanced directives . List of other physicians . Hospitalizations, surgeries, and ER visits in previous 12 months . Vitals . Screenings to include cognitive, depression, and falls . Referrals and appointments  In addition, I have reviewed and discussed with patient certain preventive protocols, quality metrics, and best practice recommendations. A written personalized care plan for preventive services as well as general preventive health recommendations were provided to patient.     Delsa Grana, PA-C  03/22/2018

## 2018-03-21 NOTE — Patient Instructions (Addendum)
Terri Alexander , Thank you for taking time to come for your Medicare Wellness Visit. I appreciate your ongoing commitment to your health goals. Please review the following plan we discussed and let me know if I can assist you in the future.   For supplementing for your bone health it is recommended Calcium 1200 mg and Vit D3 supplement 1000 IU divided into two doses per day to help you absorb it.  This is a list of the screening recommended for you and due dates:  Health Maintenance  Topic Date Due  . Mammogram  06/22/2019  . Colon Cancer Screening  08/25/2024  . Tetanus Vaccine  03/09/2025  . Flu Shot  Completed  . DEXA scan (bone density measurement)  Completed  .  Hepatitis C: One time screening is recommended by Center for Disease Control  (CDC) for  adults born from [birth year] through 1966.   Completed  . Pneumonia vaccines  Completed     Diverticulitis Diverticulitis is when small pockets in your large intestine (colon) get infected or swollen. This causes stomach pain and watery poop (diarrhea). These pouches are called diverticula. They form in people who have a condition called diverticulosis. Follow these instructions at home: Medicines  Take over-the-counter and prescription medicines only as told by your doctor. These include: ? Antibiotics. ? Pain medicines. ? Fiber pills. ? Probiotics. ? Stool softeners.  Do not drive or use heavy machinery while taking prescription pain medicine.  If you were prescribed an antibiotic, take it as told. Do not stop taking it even if you feel better. General instructions  Follow a diet as told by your doctor.  When you feel better, your doctor may tell you to change your diet. You may need to eat a lot of fiber. Fiber makes it easier to poop (have bowel movements). Healthy foods with fiber include: ? Berries. ? Beans. ? Lentils. ? Green vegetables.  Exercise 3 or more times a week. Aim for 30 minutes each time. Exercise enough to sweat  and make your heart beat faster.  Keep all follow-up visits as told. This is important. You may need to have an exam of the large intestine. This is called a colonoscopy. Contact a doctor if:  Your pain does not get better.  You have a hard time eating or drinking.  You are not pooping like normal. Get help right away if:  Your pain gets worse.  Your problems do not get better.  Your problems get worse very fast.  You have a fever.  You throw up (vomit) more than one time.  You have poop that is: ? Bloody. ? Black. ? Tarry. Summary  Diverticulitis is when small pockets in your large intestine (colon) get infected or swollen.  Take medicines only as told by your doctor.  Follow a diet as told by your doctor. This information is not intended to replace advice given to you by your health care provider. Make sure you discuss any questions you have with your health care provider. Document Released: 10/20/2007 Document Revised: 05/20/2016 Document Reviewed: 05/20/2016 Elsevier Interactive Patient Education  2017 [place].  Bone Densitometry Bone densitometry is an imaging test that uses a special X-ray to measure the amount of calcium and other minerals in your bones (bone density). This test is also known as a bone mineral density test or dual-energy X-ray absorptiometry (DXA). The test can measure bone density at your hip and your spine. It is similar to having a regular X-ray.  You may have this test to:  Diagnose a condition that causes weak or thin bones (osteoporosis).  Predict your risk of a broken bone (fracture).  Determine how well osteoporosis treatment is working.  Tell a health care provider about:  Any allergies you have.  All medicines you are taking, including vitamins, herbs, eye drops, creams, and over-the-counter medicines.  Any problems you or family members have had with anesthetic medicines.  Any blood disorders you have.  Any surgeries you  have had.  Any medical conditions you have.  Possibility of pregnancy.  Any other medical test you had within the previous 14 days that used contrast material. What are the risks? Generally, this is a safe procedure. However, problems can occur and may include the following:  This test exposes you to a very small amount of radiation.  The risks of radiation exposure may be greater to unborn children.  What happens before the procedure?  Do not take any calcium supplements for 24 hours before having the test. You can otherwise eat and drink what you usually do.  Take off all metal jewelry, eyeglasses, dental appliances, and any other metal objects. What happens during the procedure?  You may lie on an exam table. There will be an X-ray generator below you and an imaging device above you.  Other devices, such as boxes or braces, may be used to position your body properly for the scan.  You will need to lie still while the machine slowly scans your body.  The images will show up on a computer monitor. What happens after the procedure? You may need more testing at a later time. This information is not intended to replace advice given to you by your health care provider. Make sure you discuss any questions you have with your health care provider. Document Released: 05/25/2004 Document Revised: 10/09/2015 Document Reviewed: 10/11/2013 Elsevier Interactive Patient Education  2018 [place] Maintenance, Female Adopting a healthy lifestyle and getting preventive care can go a long way to promote health and wellness. Talk with your health care provider about what schedule of regular examinations is right for you. This is a good chance for you to check in with your provider about disease prevention and staying healthy. In between checkups, there are plenty of things you can do on your own. Experts have done a lot of research about which lifestyle changes and preventive measures  are most likely to keep you healthy. Ask your health care provider for more information. Weight and diet Eat a healthy diet  Be sure to include plenty of vegetables, fruits, low-fat dairy products, and lean protein.  Do not eat a lot of foods high in solid fats, added sugars, or salt.  Get regular exercise. This is one of the most important things you can do for your health. ? Most adults should exercise for at least 150 minutes each week. The exercise should increase your heart rate and make you sweat (moderate-intensity exercise). ? Most adults should also do strengthening exercises at least twice a week. This is in addition to the moderate-intensity exercise.  Maintain a healthy weight  Body mass index (BMI) is a measurement that can be used to identify possible weight problems. It estimates body fat based on height and weight. Your health care provider can help determine your BMI and help you achieve or maintain a healthy weight.  For females 62 years of age and older: ? A BMI below 18.5 is considered underweight. ? A  BMI of 18.5 to 24.9 is normal. ? A BMI of 25 to 29.9 is considered overweight. ? A BMI of 30 and above is considered obese.  Watch levels of cholesterol and blood lipids  You should start having your blood tested for lipids and cholesterol at 68 years of age, then have this test every 5 years.  You may need to have your cholesterol levels checked more often if: ? Your lipid or cholesterol levels are high. ? You are older than 68 years of age. ? You are at high risk for heart disease.  Cancer screening Lung Cancer  Lung cancer screening is recommended for adults 41-10 years old who are at high risk for lung cancer because of a history of smoking.  A yearly low-dose CT scan of the lungs is recommended for people who: ? Currently smoke. ? Have quit within the past 15 years. ? Have at least a 30-pack-year history of smoking. A pack year is smoking an average of  one pack of cigarettes a day for 1 year.  Yearly screening should continue until it has been 15 years since you quit.  Yearly screening should stop if you develop a health problem that would prevent you from having lung cancer treatment.  Breast Cancer  Practice breast self-awareness. This means understanding how your breasts normally appear and feel.  It also means doing regular breast self-exams. Let your health care provider know about any changes, no matter how small.  If you are in your 20s or 30s, you should have a clinical breast exam (CBE) by a health care provider every 1-3 years as part of a regular health exam.  If you are 42 or older, have a CBE every year. Also consider having a breast X-ray (mammogram) every year.  If you have a family history of breast cancer, talk to your health care provider about genetic screening.  If you are at high risk for breast cancer, talk to your health care provider about having an MRI and a mammogram every year.  Breast cancer gene (BRCA) assessment is recommended for women who have family members with BRCA-related cancers. BRCA-related cancers include: ? Breast. ? Ovarian. ? Tubal. ? Peritoneal cancers.  Results of the assessment will determine the need for genetic counseling and BRCA1 and BRCA2 testing.  Cervical Cancer Your health care provider may recommend that you be screened regularly for cancer of the pelvic organs (ovaries, uterus, and vagina). This screening involves a pelvic examination, including checking for microscopic changes to the surface of your cervix (Pap test). You may be encouraged to have this screening done every 3 years, beginning at age 87.  For women ages 71-65, health care providers may recommend pelvic exams and Pap testing every 3 years, or they may recommend the Pap and pelvic exam, combined with testing for human papilloma virus (HPV), every 5 years. Some types of HPV increase your risk of cervical cancer.  Testing for HPV may also be done on women of any age with unclear Pap test results.  Other health care providers may not recommend any screening for nonpregnant women who are considered low risk for pelvic cancer and who do not have symptoms. Ask your health care provider if a screening pelvic exam is right for you.  If you have had past treatment for cervical cancer or a condition that could lead to cancer, you need Pap tests and screening for cancer for at least 20 years after your treatment. If Pap tests have been  discontinued, your risk factors (such as having a new sexual partner) need to be reassessed to determine if screening should resume. Some women have medical problems that increase the chance of getting cervical cancer. In these cases, your health care provider may recommend more frequent screening and Pap tests.  Colorectal Cancer  This type of cancer can be detected and often prevented.  Routine colorectal cancer screening usually begins at 68 years of age and continues through 69 years of age.  Your health care provider may recommend screening at an earlier age if you have risk factors for colon cancer.  Your health care provider may also recommend using home test kits to check for hidden blood in the stool.  A small camera at the end of a tube can be used to examine your colon directly (sigmoidoscopy or colonoscopy). This is done to check for the earliest forms of colorectal cancer.  Routine screening usually begins at age 69.  Direct examination of the colon should be repeated every 5-10 years through 68 years of age. However, you may need to be screened more often if early forms of precancerous polyps or small growths are found.  Skin Cancer  Check your skin from head to toe regularly.  Tell your health care provider about any new moles or changes in moles, especially if there is a change in a mole's shape or color.  Also tell your health care provider if you have a mole  that is larger than the size of a pencil eraser.  Always use sunscreen. Apply sunscreen liberally and repeatedly throughout the day.  Protect yourself by wearing long sleeves, pants, a wide-brimmed hat, and sunglasses whenever you are outside.  Heart disease, diabetes, and high blood pressure  High blood pressure causes heart disease and increases the risk of stroke. High blood pressure is more likely to develop in: ? People who have blood pressure in the high end of the normal range (130-139/85-89 mm Hg). ? People who are overweight or obese. ? People who are African American.  If you are 51-29 years of age, have your blood pressure checked every 3-5 years. If you are 66 years of age or older, have your blood pressure checked every year. You should have your blood pressure measured twice-once when you are at a hospital or clinic, and once when you are not at a hospital or clinic. Record the average of the two measurements. To check your blood pressure when you are not at a hospital or clinic, you can use: ? An automated blood pressure machine at a pharmacy. ? A home blood pressure monitor.  If you are between 58 years and 28 years old, ask your health care provider if you should take aspirin to prevent strokes.  Have regular diabetes screenings. This involves taking a blood sample to check your fasting blood sugar level. ? If you are at a normal weight and have a low risk for diabetes, have this test once every three years after 68 years of age. ? If you are overweight and have a high risk for diabetes, consider being tested at a younger age or more often. Preventing infection Hepatitis B  If you have a higher risk for hepatitis B, you should be screened for this virus. You are considered at high risk for hepatitis B if: ? You were born in a country where hepatitis B is common. Ask your health care provider which countries are considered high risk. ? Your parents were born in a  high-risk  country, and you have not been immunized against hepatitis B (hepatitis B vaccine). ? You have HIV or AIDS. ? You use needles to inject street drugs. ? You live with someone who has hepatitis B. ? You have had sex with someone who has hepatitis B. ? You get hemodialysis treatment. ? You take certain medicines for conditions, including cancer, organ transplantation, and autoimmune conditions.  Hepatitis C  Blood testing is recommended for: ? Everyone born from [birth year] through 1966. ? Anyone with known risk factors for hepatitis C.  Sexually transmitted infections (STIs)  You should be screened for sexually transmitted infections (STIs) including gonorrhea and chlamydia if: ? You are sexually active and are younger than 68 years of age. ? You are older than 68 years of age and your health care provider tells you that you are at risk for this type of infection. ? Your sexual activity has changed since you were last screened and you are at an increased risk for chlamydia or gonorrhea. Ask your health care provider if you are at risk.  If you do not have HIV, but are at risk, it may be recommended that you take a prescription medicine daily to prevent HIV infection. This is called pre-exposure prophylaxis (PrEP). You are considered at risk if: ? You are sexually active and do not regularly use condoms or know the HIV status of your partner(s). ? You take drugs by injection. ? You are sexually active with a partner who has HIV.  Talk with your health care provider about whether you are at high risk of being infected with HIV. If you choose to begin PrEP, you should first be tested for HIV. You should then be tested every 3 months for as long as you are taking PrEP. Pregnancy  If you are premenopausal and you may become pregnant, ask your health care provider about preconception counseling.  If you may become pregnant, take 400 to 800 micrograms (mcg) of folic acid every day.  If you want to  prevent pregnancy, talk to your health care provider about birth control (contraception). Osteoporosis and menopause  Osteoporosis is a disease in which the bones lose minerals and strength with aging. This can result in serious bone fractures. Your risk for osteoporosis can be identified using a bone density scan.  If you are 82 years of age or older, or if you are at risk for osteoporosis and fractures, ask your health care provider if you should be screened.  Ask your health care provider whether you should take a calcium or vitamin D supplement to lower your risk for osteoporosis.  Menopause may have certain physical symptoms and risks.  Hormone replacement therapy may reduce some of these symptoms and risks. Talk to your health care provider about whether hormone replacement therapy is right for you. Follow these instructions at home:  Schedule regular health, dental, and eye exams.  Stay current with your immunizations.  Do not use any tobacco products including cigarettes, chewing tobacco, or electronic cigarettes.  If you are pregnant, do not drink alcohol.  If you are breastfeeding, limit how much and how often you drink alcohol.  Limit alcohol intake to no more than 1 drink per day for nonpregnant women. One drink equals 12 ounces of beer, 5 ounces of wine, or 1 ounces of hard liquor.  Do not use street drugs.  Do not share needles.  Ask your health care provider for help if you need support or information about quitting  drugs.  Tell your health care provider if you often feel depressed.  Tell your health care provider if you have ever been abused or do not feel safe at home. This information is not intended to replace advice given to you by your health care provider. Make sure you discuss any questions you have with your health care provider. Document Released: 11/16/2010 Document Revised: 10/09/2015 Document Reviewed: 02/04/2015 Elsevier Interactive Patient  Education  [employer].

## 2018-03-22 LAB — COMPLETE METABOLIC PANEL WITH GFR
AG RATIO: 1.5 (calc) (ref 1.0–2.5)
ALBUMIN MSPROF: 4.3 g/dL (ref 3.6–5.1)
ALT: 17 U/L (ref 6–29)
AST: 20 U/L (ref 10–35)
Alkaline phosphatase (APISO): 64 U/L (ref 33–130)
BUN: 17 mg/dL (ref 7–25)
CALCIUM: 10.1 mg/dL (ref 8.6–10.4)
CO2: 28 mmol/L (ref 20–32)
Chloride: 103 mmol/L (ref 98–110)
Creat: 0.61 mg/dL (ref 0.50–0.99)
GFR, EST NON AFRICAN AMERICAN: 94 mL/min/{1.73_m2} (ref >=60)
GFR, Est African American: 109 mL/min/{1.73_m2} (ref >=60)
GLOBULIN: 2.9 g/dL (ref 1.9–3.7)
Glucose, Bld: 88 mg/dL (ref 65–99)
POTASSIUM: 5.2 mmol/L (ref 3.5–5.3)
SODIUM: 140 mmol/L (ref 135–146)
Total Bilirubin: 0.6 mg/dL (ref 0.2–1.2)
Total Protein: 7.2 g/dL (ref 6.1–8.1)

## 2018-03-22 LAB — CBC WITH DIFFERENTIAL/PLATELET
BASOS PCT: 0.8 %
Basophils Absolute: 70 cells/uL (ref 0–200)
Eosinophils Absolute: 139 cells/uL (ref 15–500)
Eosinophils Relative: 1.6 %
HCT: 42.5 % (ref 35.0–45.0)
Hemoglobin: 14.1 g/dL (ref 11.7–15.5)
Lymphs Abs: 2105 cells/uL (ref 850–3900)
MCH: 30.3 pg (ref 27.0–33.0)
MCHC: 33.2 g/dL (ref 32.0–36.0)
MCV: 91.4 fL (ref 80.0–100.0)
MONOS PCT: 10.8 %
MPV: 10.5 fL (ref 7.5–12.5)
Neutro Abs: 5446 cells/uL (ref 1500–7800)
Neutrophils Relative %: 62.6 %
PLATELETS: 369 10*3/uL (ref 140–400)
RBC: 4.65 10*6/uL (ref 3.80–5.10)
RDW: 13.6 % (ref 11.0–15.0)
TOTAL LYMPHOCYTE: 24.2 %
WBC mixed population: 940 cells/uL (ref 200–950)
WBC: 8.7 10*3/uL (ref 3.8–10.8)

## 2018-03-22 LAB — LIPID PANEL
Cholesterol: 217 mg/dL — ABNORMAL HIGH (ref <200)
HDL: 72 mg/dL (ref >50)
LDL Cholesterol (Calc): 120 mg/dL (calc) — ABNORMAL HIGH
NON-HDL CHOLESTEROL (CALC): 145 mg/dL — AB (ref <130)
Total CHOL/HDL Ratio: 3 (calc) (ref <5.0)
Triglycerides: 139 mg/dL (ref <150)

## 2018-03-22 LAB — TSH: TSH: 1.69 mIU/L (ref 0.40–4.50)

## 2018-03-24 ENCOUNTER — Encounter: Payer: Self-pay | Admitting: Family Medicine

## 2018-03-24 ENCOUNTER — Encounter: Payer: Self-pay | Admitting: *Deleted

## 2018-03-24 DIAGNOSIS — E785 Hyperlipidemia, unspecified: Secondary | ICD-10-CM | POA: Insufficient documentation

## 2018-03-24 DIAGNOSIS — Z780 Asymptomatic menopausal state: Secondary | ICD-10-CM | POA: Insufficient documentation

## 2018-03-24 NOTE — Assessment & Plan Note (Addendum)
No side effects from Fosamax, no dysphasia, no choking episodes Supplement with Vit D and calcium - dosing reviewed and printed for pt, encouraged weight bearing exercise activity ie light lifting to help strengthen bones

## 2018-03-24 NOTE — Assessment & Plan Note (Signed)
hx of, tx with diet, repeat FLP

## 2018-04-04 ENCOUNTER — Other Ambulatory Visit: Payer: Self-pay

## 2018-04-04 DIAGNOSIS — G43801 Other migraine, not intractable, with status migrainosus: Secondary | ICD-10-CM | POA: Diagnosis not present

## 2018-04-20 ENCOUNTER — Ambulatory Visit
Admission: RE | Admit: 2018-04-20 | Discharge: 2018-04-20 | Disposition: A | Discharge status: Home / Self Care | Payer: Medicare Other | Source: Ambulatory Visit | Attending: Family Medicine | Admitting: Family Medicine

## 2018-04-20 DIAGNOSIS — M8589 Other specified disorders of bone density and structure, multiple sites: Secondary | ICD-10-CM | POA: Diagnosis not present

## 2018-04-20 DIAGNOSIS — M810 Age-related osteoporosis without current pathological fracture: Secondary | ICD-10-CM

## 2018-04-20 DIAGNOSIS — Z780 Asymptomatic menopausal state: Secondary | ICD-10-CM | POA: Diagnosis not present

## 2018-05-05 ENCOUNTER — Encounter: Payer: Self-pay | Admitting: Family Medicine

## 2018-05-05 ENCOUNTER — Ambulatory Visit (INDEPENDENT_AMBULATORY_CARE_PROVIDER_SITE_OTHER): Payer: Medicare Other | Admitting: Family Medicine

## 2018-05-05 VITALS — BP 108/70 | HR 73 | Temp 98.7°F | Resp 16 | Ht 62.0 in | Wt 127.2 lb

## 2018-05-05 DIAGNOSIS — J209 Acute bronchitis, unspecified: Secondary | ICD-10-CM | POA: Diagnosis not present

## 2018-05-05 MED ORDER — PREDNISONE 20 MG PO TABS: 40.0000 mg | ORAL_TABLET | Freq: Every day | ORAL | 0 refills | Status: AC

## 2018-05-05 MED ORDER — ALBUTEROL SULFATE HFA 108 (90 BASE) MCG/ACT IN AERS: 2.0000 | INHALATION_SPRAY | RESPIRATORY_TRACT | 0 refills | Status: DC | PRN

## 2018-05-05 MED ORDER — BENZONATATE 100 MG PO CAPS: 100.0000 mg | ORAL_CAPSULE | Freq: Three times a day (TID) | ORAL | 0 refills | Status: DC | PRN

## 2018-05-05 NOTE — Progress Notes (Signed)
Patient ID: Natalynn Pedone, female    DOB: [birth year], 67 y.o.   MRN: [id number]  PCP: Orlena Sheldon, PA-C  Chief Complaint  Patient presents with  . Cough    Patient has c/o cough, chest congestion, nasal congestion. Onset sunday night.    Subjective:   Ilissa Rosner is a 67 y.o. female, presents to clinic with CC of 4 to 5 days of URI symptoms.  She states that her nasal congestion nasal discharge and sore throat is improving after taking over-the-counter cold medicines, she has been taking Mucinex for the past 2 days because she has had a cough that is getting worse with associated tightness in her chest and sore ribs from coughing, coughing is much worse at night, mostly dry.  She has no history of asthma, COPD and no smoking history.  She has had no fevers, sweats, fatigue the last 2 days.     Patient Active Problem List   Diagnosis Date Noted  . Dyslipidemia 03/24/2018  . Postmenopausal estrogen deficiency 03/24/2018  . Squamous cell carcinoma 08/10/2017  . HSV-1 (herpes simplex virus 1) infection 03/16/2017  . Osteoporosis 04/15/2016  . Vulvar atrophy 11/07/2014  . Abnormal Pap smear of cervix 11/07/2014     Prior to Admission medications   Medication Sig Start Date End Date Taking? Authorizing Provider  alendronate (FOSAMAX) 70 MG tablet Take 1 tablet (70 mg total) by mouth every 7 (seven) days. Take with a full glass of water on an empty stomach. 03/21/18  Yes Delsa Grana, PA-C  aspirin 81 MG tablet Take 81 mg by mouth daily.   Yes [provider]  Calcium Carb-Cholecalciferol (CALCIUM 600 + D PO) Take 600 mg by mouth once.   Yes [provider]  chlorpheniramine (CHLOR-TRIMETON) 4 MG tablet Take 4 mg by mouth 2 (two) times daily as needed for allergies.   Yes [provider]  co-enzyme Q-10 30 MG capsule Take 30 mg by mouth 3 (three) times daily.   Yes [provider]  Cranberry-Cholecalciferol (SUPER CRANBERRY/VITAMIN D3) 4200-500  MG-UNIT CAPS Take by mouth.   Yes [provider]  LYSINE PO Take by mouth.   Yes [provider]  Methylcellulose, Laxative, (FIBER THERAPY PO) Take by mouth.   Yes [provider]  Multiple Vitamin (MULTIVITAMIN) tablet Take 1 tablet by mouth daily.   Yes [provider]  vitamin C (ASCORBIC ACID) 500 MG tablet Take 500 mg by mouth daily.   Yes [provider]     No Known Allergies   Family History  Problem Relation Age of Onset  . Arthritis Mother   . Heart disease Mother   . Vision loss Mother   . Stroke Mother   . Depression Sister   . Cancer Maternal Grandmother   . Cancer Maternal Grandfather   . Early death Maternal Grandfather   . Heart disease Maternal Grandfather   . Early death Paternal Grandfather   . Heart disease Paternal Grandfather   . Depression Sister   . Diabetes Other        Juvenile     Social History   Socioeconomic History  . Marital status: Married    Spouse name: Not on file  . Number of children: Not on file  . Years of education: Not on file  . Highest education level: Not on file  Occupational History  . Not on file  Social Needs  . Financial resource strain: Not on file  . Food  insecurity:    Worry: Not on file    Inability: Not on file  . Transportation needs:    Medical: Not on file    Non-medical: Not on file  Tobacco Use  . Smoking status: Never Smoker  . Smokeless tobacco: Never Used  Substance and Sexual Activity  . Alcohol use: Yes    Alcohol/week: 1.0 standard drinks    Types: 1 Glasses of wine per week  . Drug use: No  . Sexual activity: Not Currently  Lifestyle  . Physical activity:    Days per week: Not on file    Minutes per session: Not on file  . Stress: Not on file  Relationships  . Social connections:    Talks on phone: Not on file    Gets together: Not on file    Attends religious service: Not on file    Active member of club or organization: Not on file     Attends meetings of clubs or organizations: Not on file    Relationship status: Not on file  . Intimate partner violence:    Fear of current or ex partner: Not on file    Emotionally abused: Not on file    Physically abused: Not on file    Forced sexual activity: Not on file  Other Topics Concern  . Not on file  Social History Narrative  . Not on file     Review of Systems  Constitutional: Negative.   HENT: Negative.   Eyes: Negative.   Respiratory: Negative.   Cardiovascular: Negative.   Gastrointestinal: Negative.   Endocrine: Negative.   Genitourinary: Negative.   Musculoskeletal: Negative.   Skin: Negative.   Allergic/Immunologic: Negative.   Neurological: Negative.   Hematological: Negative.   Psychiatric/Behavioral: Negative.   All other systems reviewed and are negative.      Objective:    Vitals:   05/05/18 1005  BP: 108/70  Pulse: 73  Resp: 16  Temp: 98.7 F (37.1 C)  TempSrc: Oral  SpO2: 98%  Weight: 127 lb 4 oz (57.7 kg)  Height: 5\' 2"  (1.575 m)      Physical Exam Vitals signs and nursing note reviewed.  Constitutional:      General: She is not in acute distress.    Appearance: She is well-developed. She is not ill-appearing, toxic-appearing or diaphoretic.  HENT:     Head: Normocephalic and atraumatic.     Right Ear: Hearing, tympanic membrane, ear canal and external ear normal.     Left Ear: Hearing, tympanic membrane, ear canal and external ear normal.     Nose: Mucosal edema present. No congestion or rhinorrhea.     Right Sinus: No maxillary sinus tenderness or frontal sinus tenderness.     Left Sinus: No maxillary sinus tenderness or frontal sinus tenderness.     Comments: Nasal mucosa with some irritation and dried blood on the right, no sinus tenderness to palpation    Mouth/Throat:     Mouth: Mucous membranes are moist. Mucous membranes are not pale.     Pharynx: Oropharynx is clear. Uvula midline. No oropharyngeal exudate, posterior  oropharyngeal erythema or uvula swelling.     Tonsils: No tonsillar abscesses.  Eyes:     General: No scleral icterus.       Right eye: No discharge.        Left eye: No discharge.     Conjunctiva/sclera: Conjunctivae normal.     Pupils: Pupils are equal, round, and reactive to light.  Neck:     Musculoskeletal: Normal range of motion and neck supple.     Trachea: No tracheal deviation.  Cardiovascular:     Rate and Rhythm: Normal rate and regular rhythm.     Pulses: Normal pulses.     Heart sounds: Normal heart sounds. No murmur. No friction rub. No gallop.   Pulmonary:     Effort: Pulmonary effort is normal. No respiratory distress.     Breath sounds: No stridor. Rhonchi (scattered) present. No wheezing or rales.  Chest:     Chest wall: Tenderness present.  Abdominal:     General: Bowel sounds are normal. There is no distension.     Palpations: Abdomen is soft. There is no mass.  Musculoskeletal: Normal range of motion.  Skin:    General: Skin is warm and dry.     Coloration: Skin is not pale.     Findings: No rash.  Neurological:     Mental Status: She is alert.     Motor: No abnormal muscle tone.     Coordination: Coordination normal.  Psychiatric:        Behavior: Behavior normal.           Assessment & Plan:      ICD-10-CM   1. Acute bronchitis, unspecified organism J74.34     67 year old female presents with URI symptoms with improving nasal and throat symptoms and worsening cough and chest tightness.  She has good breath sounds throughout all lung fields, occasional scattered rhonchi associated and changing with coughing, she has no wheeze, and she does have some chest wall tenderness to palpation, no distress, currently afebrile, generally well-appearing.  She has a little irritation to her nasal mucosa on the right which she states is always there she has "sores" but her throat is normal-appearing and I suspect that viral illness the throat is beginning to  resolve and she still has some persistent viral chest symptoms consistent with early/mild acute bronchitis.  Discussed the viral nature of her illness do not believe she requires antibiotics at this time.  Encouraged her to do supportive and symptomatic treatment, give herself some time, treat bronchitis with steroid, inhaler, Mucinex and cough suppressants.  Follow-up with any worsening.   Delsa Grana, PA-C 05/05/18 10:25 AM

## 2018-05-05 NOTE — Patient Instructions (Signed)
Continue mucinex and drink plenty of fluids.  Use tylenol and ibuprofen as tolerated for pain/aches and fever.    Start steroids and use inhaler as needed for wheeze, cough, chest tightness or shortness of breath.  You can use of her you can use other over-the-counter cough medication like Delsym or Robitussin, I did send through [facility] which you could try to suppress cough.  Please follow-up if any of her symptoms worsen or if you have recurrent fever and worsening shortness of breath.  I do suspect you had a upper respiratory infection and now acute bronchitis both of which are viral illnesses that typically run their course

## 2018-05-18 ENCOUNTER — Other Ambulatory Visit: Payer: Self-pay | Admitting: Physician Assistant

## 2018-05-18 ENCOUNTER — Other Ambulatory Visit: Payer: Self-pay | Admitting: Family Medicine

## 2018-05-18 DIAGNOSIS — Z1231 Encounter for screening mammogram for malignant neoplasm of breast: Secondary | ICD-10-CM

## 2018-06-07 DIAGNOSIS — H33322 Round hole, left eye: Secondary | ICD-10-CM | POA: Diagnosis not present

## 2018-06-07 DIAGNOSIS — H33321 Round hole, right eye: Secondary | ICD-10-CM | POA: Diagnosis not present

## 2018-06-07 DIAGNOSIS — H43813 Vitreous degeneration, bilateral: Secondary | ICD-10-CM | POA: Diagnosis not present

## 2018-06-23 ENCOUNTER — Ambulatory Visit
Admission: RE | Admit: 2018-06-23 | Discharge: 2018-06-23 | Disposition: A | Discharge status: Home / Self Care | Payer: Medicare Other | Source: Ambulatory Visit | Attending: Family Medicine | Admitting: Family Medicine

## 2018-06-23 DIAGNOSIS — Z1231 Encounter for screening mammogram for malignant neoplasm of breast: Secondary | ICD-10-CM | POA: Diagnosis not present

## 2018-06-30 DIAGNOSIS — L309 Dermatitis, unspecified: Secondary | ICD-10-CM | POA: Diagnosis not present

## 2018-08-27 ENCOUNTER — Telehealth: Payer: Self-pay | Admitting: Family Medicine

## 2018-08-27 NOTE — Telephone Encounter (Signed)
Pt called after hours 4/11  She has had abdominal discomfort for the past 2 days, cramping pain and no BM, she took a laxatiave and this helped relieve the pressure but still cramping lower abd, feels like her previous diveritculitis episodes. No fever, no vomiting, no blood in stool, no UTI symptoms.  No new medications Still able to eat and drink normally.  Will treat for presumptive diverticulitis based on history Clear liquid diet then progress to soft food Start Cipro 500MG  bid  and fLAGYL  500MG  tid X 10 DAYS Will f/u Monday for recheck over phone Discussed red flags to go to ER, severe pain, uncontrollable vomiting etc Pt voiced understanding

## 2018-08-28 ENCOUNTER — Other Ambulatory Visit: Payer: Self-pay

## 2018-08-28 ENCOUNTER — Encounter: Payer: Self-pay | Admitting: Family Medicine

## 2018-08-28 ENCOUNTER — Ambulatory Visit (INDEPENDENT_AMBULATORY_CARE_PROVIDER_SITE_OTHER): Payer: Medicare Other | Admitting: Family Medicine

## 2018-08-28 VITALS — BP 128/68 | HR 80 | Temp 98.3°F | Resp 16 | Ht 62.0 in | Wt 129.0 lb

## 2018-08-28 DIAGNOSIS — R1030 Lower abdominal pain, unspecified: Secondary | ICD-10-CM | POA: Diagnosis not present

## 2018-08-28 LAB — URINALYSIS, ROUTINE W REFLEX MICROSCOPIC
Bilirubin Urine: NEGATIVE
Glucose, UA: NEGATIVE
Hgb urine dipstick: NEGATIVE
Ketones, ur: NEGATIVE
Leukocytes,Ua: NEGATIVE
Nitrite: NEGATIVE
Protein, ur: NEGATIVE
Specific Gravity, Urine: 1.02 (ref 1.001–1.03)
pH: 7 (ref 5.0–8.0)

## 2018-08-28 NOTE — Telephone Encounter (Signed)
Please call and check on patient abdominal pain If needed schedule OV with Kristeen Miss for tomorrow

## 2018-08-28 NOTE — Progress Notes (Signed)
Subjective:    Patient ID: Terri Alexander, female    DOB: 12/15/1950, 68 y.o.   MRN: [id number]  HPI Patient had a history of diverticulitis last fall.  Friday she developed a pressure-like sensation in her lower abdomen.  It did not feel exactly like her previous episode of diverticulitis.  She denies any fever.  She denies any nausea or vomiting.  She also reports that she has not had a good bowel movement in several days.  She typically has a bowel movement every day.  She took a laxative Friday and started having a bowel movement on Saturday.  The pressure improved some.  Today she states the pain is almost gone however she still has a little bit of pressure in her lower abdomen directly over her bladder.  Urinalysis is completely normal.  She denies any dysuria or hematuria or urgency or frequency.  Abdominal exam is benign. Past Medical History:  Diagnosis Date  . Allergy   . Blood transfusion without reported diagnosis    26 months old  . Cancer [facility]) 2013   Skin cancer  . Osteoporosis 04/15/2016   Current Outpatient Medications on File Prior to Visit  Medication Sig Dispense Refill  . albuterol (PROVENTIL HFA;VENTOLIN HFA) 108 (90 Base) MCG/ACT inhaler Inhale 2 puffs into the lungs every 4 (four) hours as needed for wheezing or shortness of breath. 1 Inhaler 0  . alendronate (FOSAMAX) 70 MG tablet Take 1 tablet (70 mg total) by mouth every 7 (seven) days. Take with a full glass of water on an empty stomach. 12 tablet 3  . aspirin 81 MG tablet Take 81 mg by mouth daily.    . Calcium Carb-Cholecalciferol (CALCIUM 600 + D PO) Take 600 mg by mouth once.    . chlorpheniramine (CHLOR-TRIMETON) 4 MG tablet Take 4 mg by mouth 2 (two) times daily as needed for allergies.    Marland Kitchen co-enzyme Q-10 30 MG capsule Take 30 mg by mouth 3 (three) times daily.    . Cranberry-Cholecalciferol (SUPER CRANBERRY/VITAMIN D3) 4200-500 MG-UNIT CAPS Take by mouth.    . LYSINE PO Take by mouth.    . Methylcellulose,  Laxative, (FIBER THERAPY PO) Take by mouth.    . Multiple Vitamin (MULTIVITAMIN) tablet Take 1 tablet by mouth daily.    . vitamin C (ASCORBIC ACID) 500 MG tablet Take 500 mg by mouth daily.    . ciprofloxacin (CIPRO) 500 MG tablet Take 500 mg by mouth 2 (two) times daily. for 10 days    . metroNIDAZOLE (FLAGYL) 500 MG tablet TAKE 1 TABLET BY MOUTH THREE TIMES DAILY FOR 10 DAYS     No current facility-administered medications on file prior to visit.    No Known Allergies Social History   Socioeconomic History  . Marital status: Married    Spouse name: Not on file  . Number of children: Not on file  . Years of education: Not on file  . Highest education level: Not on file  Occupational History  . Not on file  Social Needs  . Financial resource strain: Not on file  . Food insecurity:    Worry: Not on file    Inability: Not on file  . Transportation needs:    Medical: Not on file    Non-medical: Not on file  Tobacco Use  . Smoking status: Never Smoker  . Smokeless tobacco: Never Used  Substance and Sexual Activity  . Alcohol use: Yes    Alcohol/week: 1.0 standard drinks  Types: 1 Glasses of wine per week  . Drug use: No  . Sexual activity: Not Currently  Lifestyle  . Physical activity:    Days per week: Not on file    Minutes per session: Not on file  . Stress: Not on file  Relationships  . Social connections:    Talks on phone: Not on file    Gets together: Not on file    Attends religious service: Not on file    Active member of club or organization: Not on file    Attends meetings of clubs or organizations: Not on file    Relationship status: Not on file  . Intimate partner violence:    Fear of current or ex partner: Not on file    Emotionally abused: Not on file    Physically abused: Not on file    Forced sexual activity: Not on file  Other Topics Concern  . Not on file  Social History Narrative  . Not on file      Review of Systems  All other systems  reviewed and are negative.      Objective:   Physical Exam Vitals signs reviewed.  Constitutional:      General: She is not in acute distress.    Appearance: Normal appearance. She is not ill-appearing, toxic-appearing or diaphoretic.  Cardiovascular:     Rate and Rhythm: Normal rate and regular rhythm.     Pulses: Normal pulses.     Heart sounds: Normal heart sounds. No murmur. No friction rub. No gallop.   Pulmonary:     Effort: Pulmonary effort is normal. No respiratory distress.     Breath sounds: Normal breath sounds. No stridor. No wheezing, rhonchi or rales.  Chest:     Chest wall: No tenderness.  Abdominal:     General: Abdomen is flat. Bowel sounds are normal. There is no distension.     Palpations: Abdomen is soft.     Tenderness: There is no abdominal tenderness. There is no guarding or rebound.  Neurological:     Mental Status: She is alert.           Assessment & Plan:  Lower abdominal pain - Plan: Urinalysis, Routine w reflex microscopic  Favor constipation rather than diverticulitis based on her clinical exam and the fact her symptoms improved with a stool softener.  I recommended that she take samples of Linzess that I gave her 290 mcg daily for 2 or 3 days to facilitate having bowel movements to resolve the constipation.  If the symptoms go away after that she can discontinue the medication.  If she develops fever or worsening abdominal pain I want her to begin the Cipro and Flagyl to my partner called out for her over the weekend

## 2018-08-29 NOTE — Telephone Encounter (Signed)
Pt states she has had a BM and she is not in any pain atm. She will call office to f/u if she feels like she needs to.

## 2018-08-29 NOTE — Telephone Encounter (Signed)
Can you call this pt and see if we need to f/up with her with phone visit?  Or OV?

## 2018-10-30 DIAGNOSIS — L814 Other melanin hyperpigmentation: Secondary | ICD-10-CM | POA: Diagnosis not present

## 2018-10-30 DIAGNOSIS — L249 Irritant contact dermatitis, unspecified cause: Secondary | ICD-10-CM | POA: Diagnosis not present

## 2018-10-30 DIAGNOSIS — D229 Melanocytic nevi, unspecified: Secondary | ICD-10-CM | POA: Diagnosis not present

## 2018-10-30 DIAGNOSIS — L819 Disorder of pigmentation, unspecified: Secondary | ICD-10-CM | POA: Diagnosis not present

## 2018-10-30 DIAGNOSIS — L821 Other seborrheic keratosis: Secondary | ICD-10-CM | POA: Diagnosis not present

## 2018-10-30 DIAGNOSIS — D1801 Hemangioma of skin and subcutaneous tissue: Secondary | ICD-10-CM | POA: Diagnosis not present

## 2018-10-30 DIAGNOSIS — Z85828 Personal history of other malignant neoplasm of skin: Secondary | ICD-10-CM | POA: Diagnosis not present

## 2018-10-30 DIAGNOSIS — L570 Actinic keratosis: Secondary | ICD-10-CM | POA: Diagnosis not present

## 2018-12-06 ENCOUNTER — Other Ambulatory Visit: Payer: Self-pay | Admitting: Family Medicine

## 2018-12-06 DIAGNOSIS — M810 Age-related osteoporosis without current pathological fracture: Secondary | ICD-10-CM

## 2018-12-06 MED ORDER — ALENDRONATE SODIUM 70 MG PO TABS: 70.0000 mg | ORAL_TABLET | ORAL | 3 refills | Status: DC

## 2019-01-16 HISTORY — PX: REPAIR OF COMPLEX TRACTION RETINAL DETACHMENT: SHX6217

## 2019-01-31 DIAGNOSIS — H33323 Round hole, bilateral: Secondary | ICD-10-CM | POA: Diagnosis not present

## 2019-03-21 IMAGING — MG 2D DIGITAL SCREENING BILATERAL MAMMOGRAM WITH 3D TOMO WITH CAD
9 of 12 series · 9 of 28 positions shown · non-contrast
Comparison: Previous exam(s).

CLINICAL DATA: Screening.

EXAM:
2D DIGITAL SCREENING BILATERAL MAMMOGRAM WITH 3D TOMO WITH CAD

[R CC synth-2D]
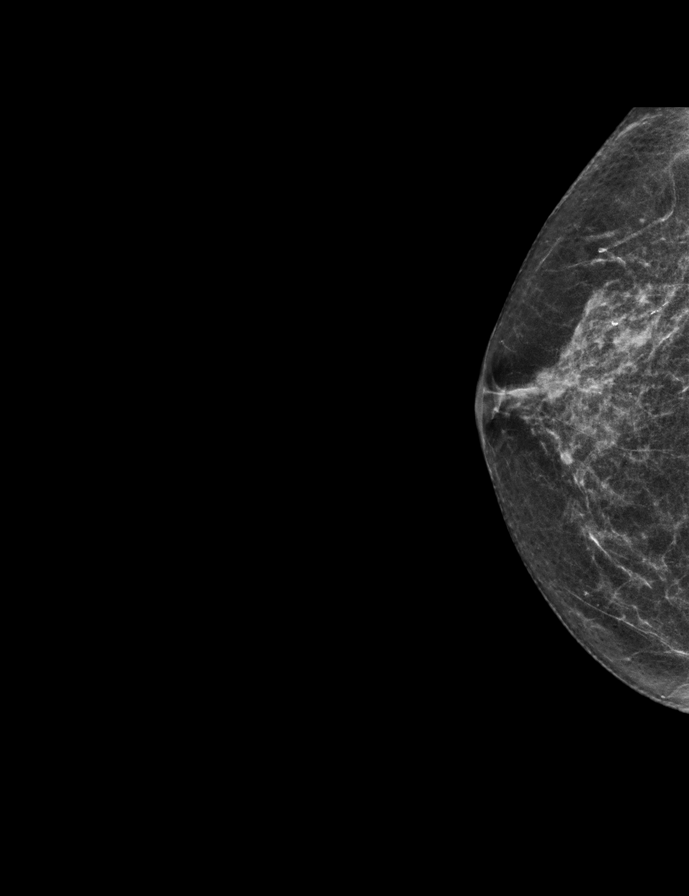

[R CC]
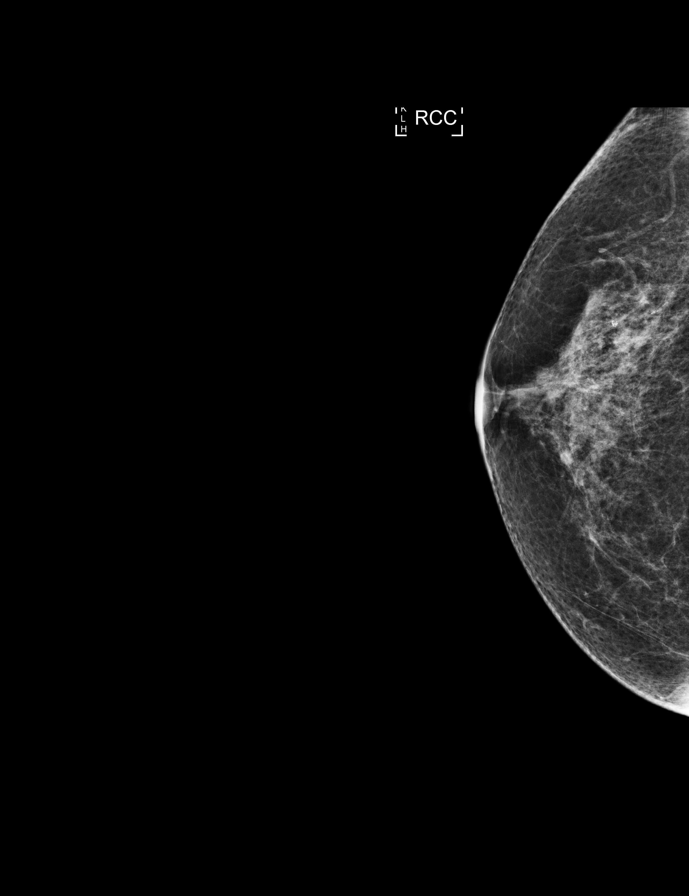

[L MLO synth-2D]
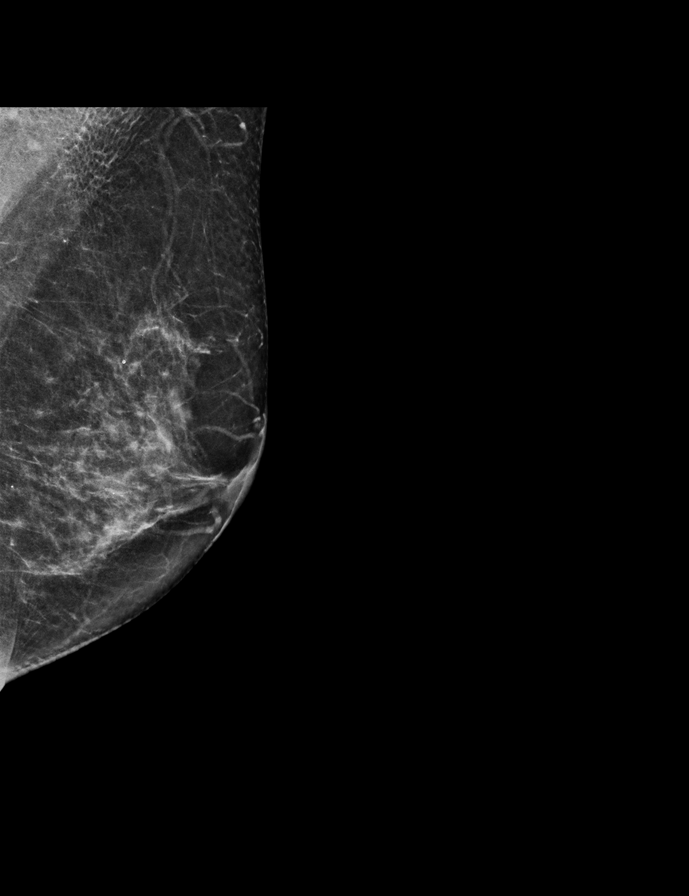

[L CC synth-2D]
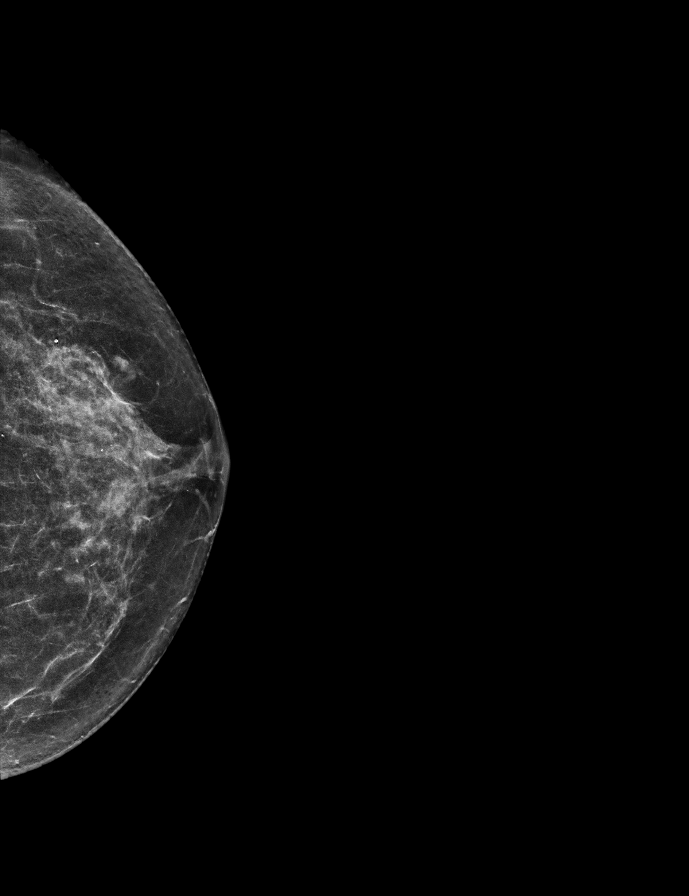

[L CC]
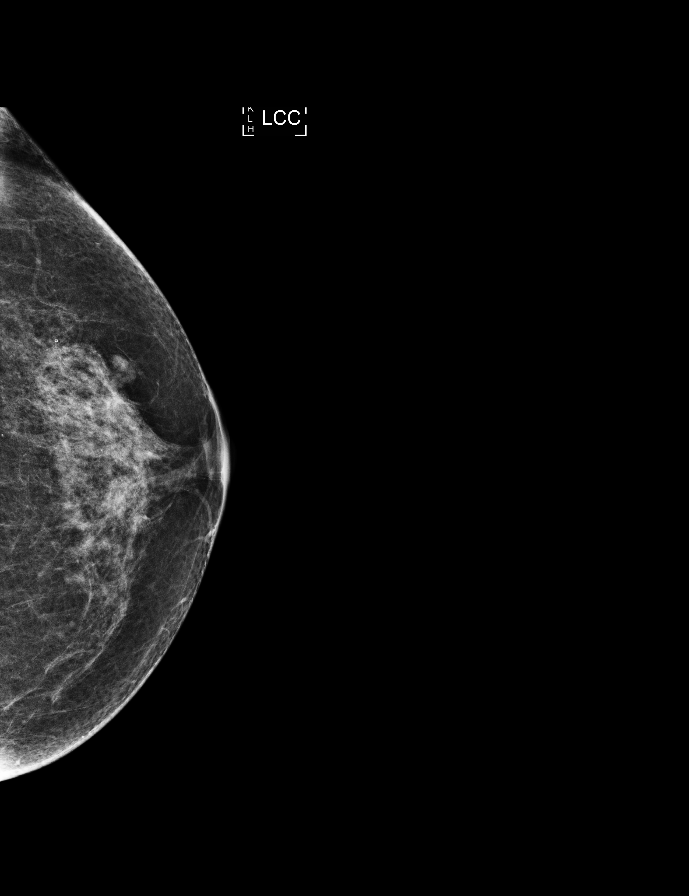

[L MLO]
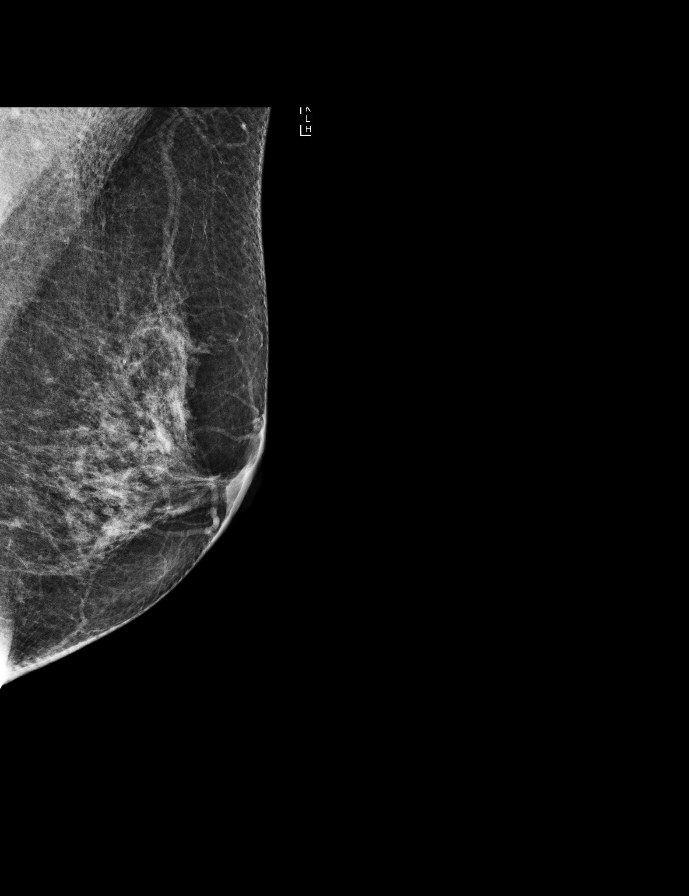

[R MLO synth-2D]
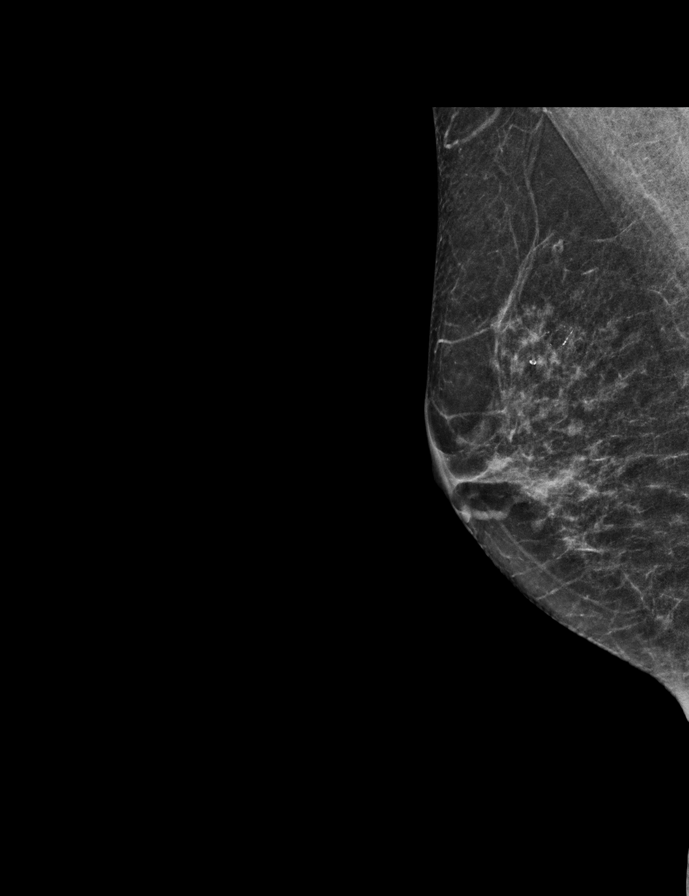

[R MLO]
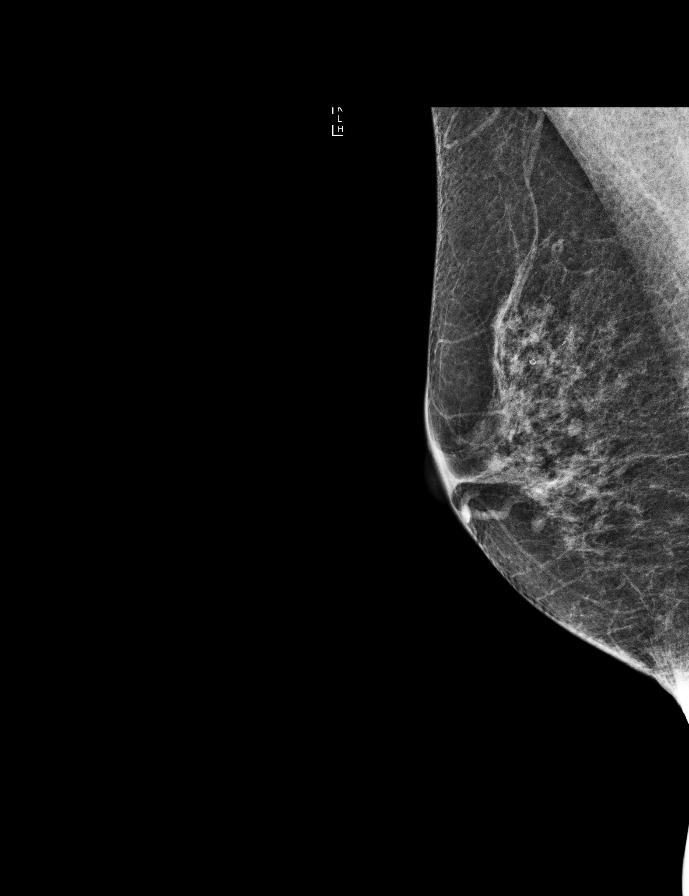

[L CC tomo · tomo slice 31/60.0]
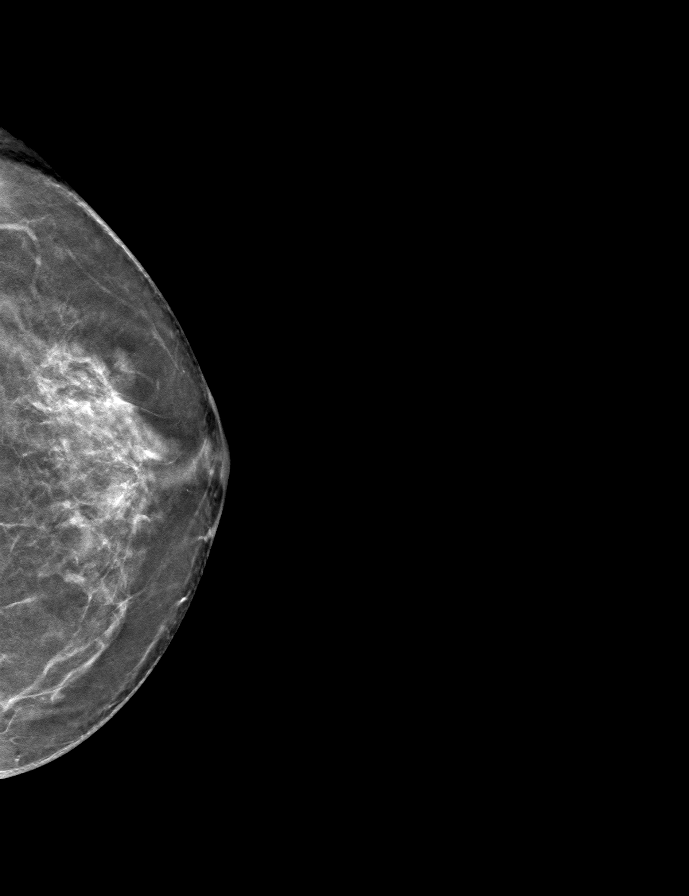

[9 of 28 positions shown; findings below may reference images not displayed]

ACR Breast Density Category c: The breast tissue is heterogeneously
dense, which may obscure small masses.
FINDINGS: There are no findings suspicious for malignancy. Images were
processed with CAD.
IMPRESSION: No mammographic evidence of malignancy. A result letter of this
screening mammogram will be mailed directly to the patient.

RECOMMENDATION:
Screening mammogram in one year. (Code:UA-[id number])

BI-RADS CATEGORY  1: Negative.

## 2019-04-09 ENCOUNTER — Other Ambulatory Visit: Payer: Self-pay

## 2019-05-21 ENCOUNTER — Other Ambulatory Visit: Payer: Self-pay | Admitting: Family Medicine

## 2019-05-21 DIAGNOSIS — Z1231 Encounter for screening mammogram for malignant neoplasm of breast: Secondary | ICD-10-CM

## 2019-05-23 ENCOUNTER — Encounter: Payer: Self-pay | Admitting: Family Medicine

## 2019-05-23 DIAGNOSIS — E785 Hyperlipidemia, unspecified: Secondary | ICD-10-CM

## 2019-05-23 DIAGNOSIS — Z0000 Encounter for general adult medical examination without abnormal findings: Secondary | ICD-10-CM

## 2019-05-24 ENCOUNTER — Other Ambulatory Visit: Payer: Medicare Other

## 2019-05-24 ENCOUNTER — Other Ambulatory Visit: Payer: Self-pay

## 2019-05-24 DIAGNOSIS — E785 Hyperlipidemia, unspecified: Secondary | ICD-10-CM | POA: Diagnosis not present

## 2019-05-24 DIAGNOSIS — Z13220 Encounter for screening for lipoid disorders: Secondary | ICD-10-CM | POA: Diagnosis not present

## 2019-05-24 DIAGNOSIS — Z0000 Encounter for general adult medical examination without abnormal findings: Secondary | ICD-10-CM

## 2019-05-25 NOTE — Addendum Note (Signed)
Addended by: Sheral Flow on: 05/25/2019 11:19 AM   Modules accepted: Orders

## 2019-05-26 LAB — LIPID PANEL
Cholesterol: 217 mg/dL — ABNORMAL HIGH (ref <200)
HDL: 71 mg/dL (ref >=50)
LDL Cholesterol (Calc): 123 mg/dL (calc) — ABNORMAL HIGH
Non-HDL Cholesterol (Calc): 146 mg/dL (calc) — ABNORMAL HIGH (ref <130)
Total CHOL/HDL Ratio: 3.1 (calc) (ref <5.0)
Triglycerides: 115 mg/dL (ref <150)

## 2019-05-26 LAB — TEST AUTHORIZATION

## 2019-05-26 LAB — COMPLETE METABOLIC PANEL WITH GFR
AG Ratio: 1.6 (calc) (ref 1.0–2.5)
ALT: 11 U/L (ref 6–29)
AST: 15 U/L (ref 10–35)
Albumin: 4.3 g/dL (ref 3.6–5.1)
Alkaline phosphatase (APISO): 63 U/L (ref 37–153)
BUN: 16 mg/dL (ref 7–25)
CO2: 31 mmol/L (ref 20–32)
Calcium: 9.7 mg/dL (ref 8.6–10.4)
Chloride: 105 mmol/L (ref 98–110)
Creat: 0.72 mg/dL (ref 0.50–0.99)
GFR, Est African American: 99 mL/min/{1.73_m2} (ref >=60)
GFR, Est Non African American: 85 mL/min/{1.73_m2} (ref >=60)
Globulin: 2.7 g/dL (calc) (ref 1.9–3.7)
Glucose, Bld: 95 mg/dL (ref 65–99)
Potassium: 5.1 mmol/L (ref 3.5–5.3)
Sodium: 142 mmol/L (ref 135–146)
Total Bilirubin: 0.5 mg/dL (ref 0.2–1.2)
Total Protein: 7 g/dL (ref 6.1–8.1)

## 2019-05-26 LAB — LEAD, BLOOD (ADULT >= 16 YRS)

## 2019-05-26 LAB — CBC WITH DIFFERENTIAL/PLATELET
Absolute Monocytes: 840 cells/uL (ref 200–950)
Basophils Absolute: 60 cells/uL (ref 0–200)
Basophils Relative: 0.8 %
Eosinophils Absolute: 210 cells/uL (ref 15–500)
Eosinophils Relative: 2.8 %
HCT: 42.9 % (ref 35.0–45.0)
Hemoglobin: 13.7 g/dL (ref 11.7–15.5)
Lymphs Abs: 1890 cells/uL (ref 850–3900)
MCH: 29.1 pg (ref 27.0–33.0)
MCHC: 31.9 g/dL — ABNORMAL LOW (ref 32.0–36.0)
MCV: 91.1 fL (ref 80.0–100.0)
MPV: 10 fL (ref 7.5–12.5)
Monocytes Relative: 11.2 %
Neutro Abs: 4500 cells/uL (ref 1500–7800)
Neutrophils Relative %: 60 %
Platelets: 361 10*3/uL (ref 140–400)
RBC: 4.71 10*6/uL (ref 3.80–5.10)
RDW: 13.4 % (ref 11.0–15.0)
Total Lymphocyte: 25.2 %
WBC: 7.5 10*3/uL (ref 3.8–10.8)

## 2019-05-29 ENCOUNTER — Encounter: Payer: Self-pay | Admitting: Family Medicine

## 2019-05-29 ENCOUNTER — Ambulatory Visit (INDEPENDENT_AMBULATORY_CARE_PROVIDER_SITE_OTHER): Payer: Medicare Other | Admitting: Family Medicine

## 2019-05-29 ENCOUNTER — Other Ambulatory Visit: Payer: Self-pay

## 2019-05-29 VITALS — BP 102/58 | HR 92 | Temp 98.2°F | Resp 14 | Ht 62.0 in | Wt 125.0 lb

## 2019-05-29 DIAGNOSIS — Z0000 Encounter for general adult medical examination without abnormal findings: Secondary | ICD-10-CM | POA: Diagnosis not present

## 2019-05-29 DIAGNOSIS — Z860100 Personal history of colon polyps, unspecified: Secondary | ICD-10-CM

## 2019-05-29 DIAGNOSIS — M810 Age-related osteoporosis without current pathological fracture: Secondary | ICD-10-CM

## 2019-05-29 DIAGNOSIS — F418 Other specified anxiety disorders: Secondary | ICD-10-CM

## 2019-05-29 DIAGNOSIS — E785 Hyperlipidemia, unspecified: Secondary | ICD-10-CM

## 2019-05-29 MED ORDER — SIMVASTATIN 10 MG PO TABS: 10.0000 mg | ORAL_TABLET | Freq: Every day | ORAL | 3 refills | Status: DC

## 2019-05-29 NOTE — Patient Instructions (Signed)
Start zocor 10mg  at bedtime  F/U lab visit in 3 months

## 2019-05-29 NOTE — Progress Notes (Signed)
Subjective:   Patient presents for Medicare Annual/Subsequent preventive examination.   Patient here for annual wellness exam.  Medications and history reviewed.  Osteoporosis currently on-Fosamax as well as calcium and vitamin D  History of skin cancer she is followed by dermatology, history of SCC   Hyperlipidemia currently diet-controlled.  Had increased anxiety caring for her mother.  Her mother has declined with regards to her health.  She is 89 she has aortic valve problem as well as heart failure which has not been improving.  She has been worried about her daily.  She is stressed caring for her and trying to coordinate care with her doctor in [place].  She had a couple episodes where she felt some palpitations but when she called herself down they went away.  States that she has had this in the past when she had high anxiety.  Her sleep is fair denies feeling depressed.  She does not want any medication at this time.    Followed by Eye doctor- has history of holes in both Retina that has been lasered   Diverticulitis-  Due this sumer for colonoscopy / colace stool softner   Review Past Medical/Family/Social: Per EMR    Risk Factors  Current exercise habits: Walks Dietary issues discussed: discussed cholesterol   Cardiac risk factors: Hyperlipidemia  Depression Screen  (Note: if answer to either of the following is "Yes", a more complete depression screening is indicated)  Over the past two weeks, have you felt down, depressed or hopeless? No Over the past two weeks, have you felt little interest or pleasure in doing things? No Have you lost interest or pleasure in daily life? No Do you often feel hopeless? No Do you cry easily over simple problems? No   Activities of Daily Living  In your present state of health, do you have any difficulty performing the following activities?:  Driving? No  Managing money? No  Feeding yourself? No  Getting from bed to chair? No   Climbing a flight of stairs? No  Preparing food and eating?: No  Bathing or showering? No  Getting dressed: No  Getting to the toilet? No  Using the toilet:No  Moving around from place to place: No  In the past year have you fallen or had a near fall?:No  Are you sexually active? No  Do you have more than one partner? No   Hearing Difficulties: No  Do you often ask people to speak up or repeat themselves? No  Do you experience ringing or noises in your ears? No Do you have difficulty understanding soft or whispered voices? No  Do you feel that you have a problem with memory? No Do you often misplace items? No  Do you feel safe at home? Yes  Cognitive Testing  Alert? Yes Normal Appearance?Yes  Oriented to person? Yes Place? Yes  Time? Yes  Recall of three objects? Yes  Can perform simple calculations? Yes  Displays appropriate judgment?Yes  Can read the correct time from a watch face?Yes   List the Names of Other Physician/Practitioners you currently use:  Dermatology Dr. Pearline Cables Ophthalmology- Dr. Noel Journey  Screening Tests / Date Colonoscopy     UD 2016                 Pneumonia- UTD PAP Smear normal  Mammogram up-to-date Bone density done December 2019 Influenza Vaccine- UTD  Shingles-  UTD Tetanus/tdap UTD  ROS: GEN- denies fatigue, fever, weight loss,weakness, recent illness HEENT- denies  eye drainage, change in vision, nasal discharge, CVS- denies chest pain, palpitations RESP- denies SOB, cough, wheeze ABD- denies N/V, change in stools, abd pain GU- denies dysuria, hematuria, dribbling, incontinence MSK- denies joint pain, muscle aches, injury Neuro- denies headache, dizziness, syncope, seizure activity  Physical: vitals reviewed  GEN- NAD, alert and oriented x3 HEENT- PERRL, EOMI, non injected sclera, pink conjunctiva, MMM, oropharynx clear Neck- Supple, no thryomegalym no bruit CVS- RRR, no murmur RESP-CTAB ABD-NABS,soft,ND,NT PSYCH- stressed  appearing, not anxious, well groomed, good eye contact  EXT- No edema Pulses- Radial, DP- 2+  Gad 7 score 7  PHQ 1 score 1    Assessment:    Annual wellness medicare exam   Plan:    During the course of the visit the patient was educated and counseled about appropriate screening and preventive services including:  CPE done, reviewed recent fasting labs at bedside with pt   Fall/ audit C/Depression screen negative  Osteoporosis-continue fosamax/ calcium and vitamin D, Plan for bone density in the summer  History of skin cancer- followed by dermatology   Hyperlipidemia- increasing risk factor for heart diease, runs in family, start low dose statin zocor 10mg  at bedtime  Situational anxiety- will hold off on meds, in future could try prn atarax 10mg ,            .  Diet review for nutrition referral? Yes ____ Not Indicated __x__  Patient Instructions (the written plan) was given to the patient.  Medicare Attestation  I have personally reviewed:  The patient's medical and social history  Their use of alcohol, tobacco or illicit drugs  Their current medications and supplements  The patient's functional ability including ADLs,fall risks, home safety risks, cognitive, and hearing and visual impairment  Diet and physical activities  Evidence for depression or mood disorders  The patient's weight, height, BMI, and visual acuity have been recorded in the chart. I have made referrals, counseling, and provided education to the patient based on review of the above and I have provided the patient with a written personalized care plan for preventive services.

## 2019-06-08 ENCOUNTER — Other Ambulatory Visit: Payer: Self-pay

## 2019-06-08 ENCOUNTER — Ambulatory Visit: Payer: Medicare Other | Attending: Internal Medicine

## 2019-06-08 ENCOUNTER — Other Ambulatory Visit: Payer: Medicare Other

## 2019-06-08 DIAGNOSIS — Z20822 Contact with and (suspected) exposure to covid-19: Secondary | ICD-10-CM

## 2019-06-09 LAB — NOVEL CORONAVIRUS, NAA: SARS-CoV-2, NAA: DETECTED — AB

## 2019-06-10 ENCOUNTER — Encounter: Payer: Self-pay | Admitting: Nurse Practitioner

## 2019-06-10 ENCOUNTER — Other Ambulatory Visit (HOSPITAL_COMMUNITY): Payer: Self-pay | Admitting: Nurse Practitioner

## 2019-06-10 DIAGNOSIS — U071 COVID-19: Secondary | ICD-10-CM

## 2019-06-10 NOTE — Progress Notes (Signed)
  I connected by phone with Noe Gens on 06/10/2019 at 8:47 AM to discuss the potential use of an new treatment for mild to moderate COVID-19 viral infection in non-hospitalized patients.  This patient is a 69 y.o. female that meets the FDA criteria for Emergency Use Authorization of bamlanivimab or casirivimab\imdevimab.  Has a (+) direct SARS-CoV-2 viral test result  Has mild or moderate COVID-19   Is ? 70 years of age and weighs ? 40 kg  Is NOT hospitalized due to COVID-19  Is NOT requiring oxygen therapy or requiring an increase in baseline oxygen flow rate due to COVID-19  Is within 10 days of symptom onset  Has at least one of the high risk factor(s) for progression to severe COVID-19 and/or hospitalization as defined in EUA.  Specific high risk criteria : >/= 70 yo   I have spoken and communicated the following to the patient or parent/caregiver:  1. FDA has authorized the emergency use of bamlanivimab and casirivimab\imdevimab for the treatment of mild to moderate COVID-19 in adults and pediatric patients with positive results of direct SARS-CoV-2 viral testing who are 56 years of age and older weighing at least 40 kg, and who are at high risk for progressing to severe COVID-19 and/or hospitalization.  2. The significant known and potential risks and benefits of bamlanivimab and casirivimab\imdevimab, and the extent to which such potential risks and benefits are unknown.  3. Information on available alternative treatments and the risks and benefits of those alternatives, including clinical trials.  4. Patients treated with bamlanivimab and casirivimab\imdevimab should continue to self-isolate and use infection control measures (e.g., wear mask, isolate, social distance, avoid sharing personal items, clean and disinfect "high touch" surfaces, and frequent handwashing) according to CDC guidelines.   5. The patient or parent/caregiver has the option to accept or refuse  bamlanivimab or casirivimab\imdevimab .  After reviewing this information with the patient, The patient agreed to proceed with receiving the casirivimab\imdevimab infusion and will be provided a copy of the Fact sheet prior to receiving the infusion.Verlon Au 06/10/2019 8:47 AM

## 2019-06-13 ENCOUNTER — Ambulatory Visit (HOSPITAL_COMMUNITY)
Admission: RE | Admit: 2019-06-13 | Discharge: 2019-06-13 | Disposition: A | Discharge status: Home / Self Care | Payer: Medicare Other | Source: Ambulatory Visit | Attending: Pulmonary Disease | Admitting: Pulmonary Disease

## 2019-06-13 DIAGNOSIS — U071 COVID-19: Secondary | ICD-10-CM | POA: Insufficient documentation

## 2019-06-13 DIAGNOSIS — Z23 Encounter for immunization: Secondary | ICD-10-CM | POA: Insufficient documentation

## 2019-06-13 MED ORDER — DIPHENHYDRAMINE HCL 50 MG/ML IJ SOLN: 50.0000 mg | Freq: Once | INTRAMUSCULAR | Status: DC | PRN

## 2019-06-13 MED ORDER — EPINEPHRINE 0.3 MG/0.3ML IJ SOAJ: 0.3000 mg | Freq: Once | INTRAMUSCULAR | Status: DC | PRN

## 2019-06-13 MED ORDER — FAMOTIDINE IN NACL 20-0.9 MG/50ML-% IV SOLN: 20.0000 mg | Freq: Once | INTRAVENOUS | Status: DC | PRN

## 2019-06-13 MED ORDER — METHYLPREDNISOLONE SODIUM SUCC 125 MG IJ SOLR: 125.0000 mg | Freq: Once | INTRAMUSCULAR | Status: DC | PRN

## 2019-06-13 MED ORDER — SODIUM CHLORIDE 0.9 % IV SOLN
Freq: Once | INTRAVENOUS | Status: AC
  Filled 2019-06-13: qty 10

## 2019-06-13 MED ORDER — ALBUTEROL SULFATE HFA 108 (90 BASE) MCG/ACT IN AERS: 2.0000 | INHALATION_SPRAY | Freq: Once | RESPIRATORY_TRACT | Status: DC | PRN

## 2019-06-13 MED ORDER — SODIUM CHLORIDE 0.9 % IV SOLN
INTRAVENOUS | Status: DC | PRN
  Administered 2019-06-13: 250 mL via INTRAVENOUS

## 2019-06-13 NOTE — Discharge Instructions (Signed)
10 Things You Can Do to Manage Your COVID-19 Symptoms at Home If you have possible or confirmed COVID-19: 1. Stay home from work and school. And stay away from other public places. If you must go out, avoid using any kind of public transportation, ridesharing, or taxis. 2. Monitor your symptoms carefully. If your symptoms get worse, call your healthcare provider immediately. 3. Get rest and stay hydrated. 4. If you have a medical appointment, call the healthcare provider ahead of time and tell them that you have or may have COVID-19. 5. For medical emergencies, call 911 and notify the dispatch personnel that you have or may have COVID-19. 6. Cover your cough and sneezes with a tissue or use the inside of your elbow. 7. Wash your hands often with soap and water for at least 20 seconds or clean your hands with an alcohol-based hand sanitizer that contains at least 60% alcohol. 8. As much as possible, stay in a specific room and away from other people in your home. Also, you should use a separate bathroom, if available. If you need to be around other people in or outside of the home, wear a mask. 9. Avoid sharing personal items with other people in your household, like dishes, towels, and bedding. 10. Clean all surfaces that are touched often, like counters, tabletops, and doorknobs. Use household cleaning sprays or wipes according to the label instructions. cdc.gov/coronavirus 11/15/2018 This information is not intended to replace advice given to you by your health care provider. Make sure you discuss any questions you have with your health care provider. Document Revised: 04/19/2019 Document Reviewed: 04/19/2019 Elsevier Patient Education  2020 Elsevier Inc.  

## 2019-06-13 NOTE — Progress Notes (Signed)
  Diagnosis: COVID-19  Physician: Dr. Joya Gaskins  Procedure: [facility] Med: bamlanivimab infusion - Provided patient with bamlanimivab fact sheet for patients, parents and caregivers prior to infusion.  Complications: No immediate complications noted.  Discharge: Discharged home   Janine Ores 06/13/2019

## 2019-06-13 NOTE — Progress Notes (Signed)
  Diagnosis: COVID-19  Physician: Dr. Vic Blackbird  Procedure: [facility] Infusion Clinic Med: casirivimab\imdevimab infusion - Provided patient with casirivimab\imdevimab fact sheet for patients, parents and caregivers prior to infusion.  Complications: No immediate complications noted.  Discharge: Discharged home   Maeley Matton L 06/13/2019

## 2019-06-28 ENCOUNTER — Other Ambulatory Visit: Payer: Self-pay

## 2019-06-28 ENCOUNTER — Ambulatory Visit
Admission: RE | Admit: 2019-06-28 | Discharge: 2019-06-28 | Disposition: A | Discharge status: Home / Self Care | Payer: Medicare Other | Source: Ambulatory Visit | Attending: Family Medicine | Admitting: Family Medicine

## 2019-06-28 DIAGNOSIS — Z1231 Encounter for screening mammogram for malignant neoplasm of breast: Secondary | ICD-10-CM | POA: Diagnosis not present

## 2019-08-23 ENCOUNTER — Ambulatory Visit: Payer: Medicare Other

## 2019-09-12 ENCOUNTER — Ambulatory Visit: Payer: Medicare Other | Attending: Internal Medicine

## 2019-09-12 DIAGNOSIS — Z23 Encounter for immunization: Secondary | ICD-10-CM

## 2019-09-12 NOTE — Progress Notes (Signed)
   Covid-19 Vaccination Clinic  Name:  Terri Alexander    MRN: [id number] DOB: [birth year]  09/12/2019  [name] was observed post Covid-19 immunization for 15 minutes without incident. She was provided with Vaccine Information Sheet and instruction to access the V-Safe system.   [name] was instructed to call 911 with any severe reactions post vaccine: Marland Kitchen Difficulty breathing  . Swelling of face and throat  . A fast heartbeat  . A bad rash all over body  . Dizziness and weakness   Immunizations Administered    Name Date Dose VIS Date Route   Pfizer COVID-19 Vaccine 09/12/2019  8:21 AM 0.3 mL 07/11/2018 Intramuscular   Manufacturer: [place]   Lot: [id number]   [place]: [id number]

## 2019-09-17 ENCOUNTER — Other Ambulatory Visit: Payer: Self-pay | Admitting: *Deleted

## 2019-09-17 ENCOUNTER — Ambulatory Visit: Payer: Medicare Other

## 2019-09-17 ENCOUNTER — Telehealth: Payer: Self-pay | Admitting: Internal Medicine

## 2019-09-17 ENCOUNTER — Encounter: Payer: Self-pay | Admitting: Family Medicine

## 2019-09-17 DIAGNOSIS — Z1211 Encounter for screening for malignant neoplasm of colon: Secondary | ICD-10-CM

## 2019-09-17 DIAGNOSIS — E785 Hyperlipidemia, unspecified: Secondary | ICD-10-CM

## 2019-09-17 NOTE — Telephone Encounter (Signed)
Dr. Carlean Purl, this pt was referred for a colonoscopy.  Her previous colonoscopy was in 2016 in [place] (available in [place] under "Procedures" for review.)  Her husband is your patient and she requested you as a GI MD.  Please review and advise scheduling.

## 2019-09-18 ENCOUNTER — Other Ambulatory Visit: Payer: Self-pay

## 2019-09-18 ENCOUNTER — Other Ambulatory Visit: Payer: Medicare Other

## 2019-09-18 ENCOUNTER — Encounter: Payer: Self-pay | Admitting: Internal Medicine

## 2019-09-18 DIAGNOSIS — E785 Hyperlipidemia, unspecified: Secondary | ICD-10-CM

## 2019-09-18 LAB — CBC WITH DIFFERENTIAL/PLATELET
Absolute Monocytes: 762 cells/uL (ref 200–950)
Basophils Absolute: 38 cells/uL (ref 0–200)
Basophils Relative: 0.6 %
Eosinophils Absolute: 13 cells/uL — ABNORMAL LOW (ref 15–500)
Eosinophils Relative: 0.2 %
HCT: 42.2 % (ref 35.0–45.0)
Hemoglobin: 13.8 g/dL (ref 11.7–15.5)
Lymphs Abs: 2066 cells/uL (ref 850–3900)
MCH: 30.3 pg (ref 27.0–33.0)
MCHC: 32.7 g/dL (ref 32.0–36.0)
MCV: 92.5 fL (ref 80.0–100.0)
MPV: 10.5 fL (ref 7.5–12.5)
Monocytes Relative: 12.1 %
Neutro Abs: 3421 cells/uL (ref 1500–7800)
Neutrophils Relative %: 54.3 %
Platelets: 312 10*3/uL (ref 140–400)
RBC: 4.56 10*6/uL (ref 3.80–5.10)
RDW: 13.4 % (ref 11.0–15.0)
Total Lymphocyte: 32.8 %
WBC: 6.3 10*3/uL (ref 3.8–10.8)

## 2019-09-18 LAB — LIPID PANEL
Cholesterol: 179 mg/dL (ref <200)
HDL: 64 mg/dL (ref >=50)
LDL Cholesterol (Calc): 92 mg/dL (calc)
Non-HDL Cholesterol (Calc): 115 mg/dL (calc) (ref <130)
Total CHOL/HDL Ratio: 2.8 (calc) (ref <5.0)
Triglycerides: 131 mg/dL (ref <150)

## 2019-09-18 LAB — COMPLETE METABOLIC PANEL WITH GFR
AG Ratio: 1.6 (calc) (ref 1.0–2.5)
ALT: 15 U/L (ref 6–29)
AST: 16 U/L (ref 10–35)
Albumin: 4.1 g/dL (ref 3.6–5.1)
Alkaline phosphatase (APISO): 67 U/L (ref 37–153)
BUN: 16 mg/dL (ref 7–25)
CO2: 30 mmol/L (ref 20–32)
Calcium: 9.5 mg/dL (ref 8.6–10.4)
Chloride: 104 mmol/L (ref 98–110)
Creat: 0.7 mg/dL (ref 0.50–0.99)
GFR, Est African American: 102 mL/min/{1.73_m2} (ref >=60)
GFR, Est Non African American: 88 mL/min/{1.73_m2} (ref >=60)
Globulin: 2.5 g/dL (calc) (ref 1.9–3.7)
Glucose, Bld: 94 mg/dL (ref 65–99)
Potassium: 4.8 mmol/L (ref 3.5–5.3)
Sodium: 141 mmol/L (ref 135–146)
Total Bilirubin: 0.5 mg/dL (ref 0.2–1.2)
Total Protein: 6.6 g/dL (ref 6.1–8.1)

## 2019-09-18 NOTE — Telephone Encounter (Signed)
Pt stated that she has had several colonoscopies and will drop off physical reports at our office.  She stated that she did receive a letter from former GI MD stating that she is due to have colon-will await additional records for Dr. Carlean Purl to review.

## 2019-09-18 NOTE — Telephone Encounter (Signed)
She brought pathology OK to schedule a surveillance colonoscopy - direct  Hx adenomatous colon polyps

## 2019-09-18 NOTE — Telephone Encounter (Signed)
Please tell the patient that it looks like she may be ready for a colonoscopy  Did she get a letter from old GI MD to schedule?   If we can would be best to get pathology reports from 2016 and any other prior colonoscopy reports so I can make the best decision for her and not do the colonoscopy sooner than needed

## 2019-09-18 NOTE — Telephone Encounter (Signed)
Patient brought in 2011 and 2016 colonoscopy/path reports. Records placed on Dr. Celesta Aver desk for review. Patient states that she has been having colonoscopies every 5 years.

## 2019-09-21 ENCOUNTER — Encounter: Payer: Self-pay | Admitting: *Deleted

## 2019-10-08 ENCOUNTER — Other Ambulatory Visit: Payer: Self-pay

## 2019-10-08 ENCOUNTER — Ambulatory Visit: Payer: Medicare Other

## 2019-10-08 ENCOUNTER — Ambulatory Visit (AMBULATORY_SURGERY_CENTER): Payer: Self-pay | Admitting: *Deleted

## 2019-10-08 VITALS — Ht 61.0 in | Wt 126.4 lb

## 2019-10-08 DIAGNOSIS — Z86010 Personal history of colonic polyps: Secondary | ICD-10-CM

## 2019-10-08 DIAGNOSIS — K5730 Diverticulosis of large intestine without perforation or abscess without bleeding: Secondary | ICD-10-CM

## 2019-10-08 NOTE — Progress Notes (Signed)
Patient denies any allergies to egg or soy products. Patient denies complications with anesthesia/sedation.  Patient denies oxygen use at home and denies diet medications. Emmi instructions for colonoscopy explained and given to patient.  

## 2019-10-22 ENCOUNTER — Encounter: Payer: Self-pay | Admitting: Internal Medicine

## 2019-10-22 ENCOUNTER — Ambulatory Visit (AMBULATORY_SURGERY_CENTER): Payer: Medicare Other | Admitting: Internal Medicine

## 2019-10-22 ENCOUNTER — Other Ambulatory Visit: Payer: Self-pay

## 2019-10-22 VITALS — BP 131/72 | HR 74 | Temp 96.8°F | Resp 13 | Ht 61.0 in | Wt 126.0 lb

## 2019-10-22 DIAGNOSIS — Z86010 Personal history of colonic polyps: Secondary | ICD-10-CM

## 2019-10-22 MED ORDER — SODIUM CHLORIDE 0.9 % IV SOLN: 500.0000 mL | Freq: Once | INTRAVENOUS | Status: DC

## 2019-10-22 NOTE — Progress Notes (Signed)
A/ox3, pleased with MAC, report to RN 

## 2019-10-22 NOTE — Patient Instructions (Addendum)
Handouts given:  Diverticulosis No polyps today. Resume previous diet Continue present medications No repeat colonoscopy due to age and the absence of colonic polyps   I do not think you will need another routine colonoscopy as guidelines would indicate repeating in 10 years and based upon your history and age of 29 then I would not do one unless you are having problems.  I appreciate the opportunity to care for you. Gatha Mayer, MD, FACG  YOU HAD AN ENDOSCOPIC PROCEDURE TODAY AT [facility]:   Refer to the procedure report that was given to you for any specific questions about what was found during the examination.  If the procedure report does not answer your questions, please call your gastroenterologist to clarify.  If you requested that your care partner not be given the details of your procedure findings, then the procedure report has been included in a sealed envelope for you to review at your convenience later.  YOU SHOULD EXPECT: Some feelings of bloating in the abdomen. Passage of more gas than usual.  Walking can help get rid of the air that was put into your GI tract during the procedure and reduce the bloating. If you had a lower endoscopy (such as a colonoscopy or flexible sigmoidoscopy) you may notice spotting of blood in your stool or on the toilet paper. If you underwent a bowel prep for your procedure, you may not have a normal bowel movement for a few days.  Please Note:  You might notice some irritation and congestion in your nose or some drainage.  This is from the oxygen used during your procedure.  There is no need for concern and it should clear up in a day or so.  SYMPTOMS TO REPORT IMMEDIATELY:   Following lower endoscopy (colonoscopy or flexible sigmoidoscopy):  Excessive amounts of blood in the stool  Significant tenderness or worsening of abdominal pains  Swelling of the abdomen that is new, acute  Fever of 100F or higher   For urgent  or emergent issues, a gastroenterologist can be reached at any hour by calling [phone]. Do not use MyChart messaging for urgent concerns.    DIET:  We do recommend a small meal at first, but then you may proceed to your regular diet.  Drink plenty of fluids but you should avoid alcoholic beverages for 24 hours.  ACTIVITY:  You should plan to take it easy for the rest of today and you should NOT DRIVE or use heavy machinery until tomorrow (because of the sedation medicines used during the test).    FOLLOW UP: Our staff will call the number listed on your records 48-72 hours following your procedure to check on you and address any questions or concerns that you may have regarding the information given to you following your procedure. If we do not reach you, we will leave a message.  We will attempt to reach you two times.  During this call, we will ask if you have developed any symptoms of COVID 19. If you develop any symptoms (ie: fever, flu-like symptoms, shortness of breath, cough etc.) before then, please call [phone].  If you test positive for Covid 19 in the 2 weeks post procedure, please call and report this information to [place].    If any biopsies were taken you will be contacted by phone or by letter within the next 1-3 weeks.  Please call us at [phone] if you have not heard about the biopsies in  3 weeks.    SIGNATURES/CONFIDENTIALITY: You and/or your care partner have signed paperwork which will be entered into your electronic medical record.  These signatures attest to the fact that that the information above on your After Visit Summary has been reviewed and is understood.  Full responsibility of the confidentiality of this discharge information lies with you and/or your care-partner.

## 2019-10-22 NOTE — Op Note (Signed)
[facility] Patient Name: Terri Alexander Procedure Date: 10/22/2019 9:12 AM MRN: [id number] Endoscopist: Gatha Mayer , MD Age: 69 Referring MD:  Date of Birth: 07-Aug-1950 Gender: Female Account #: [id number] Procedure:                Colonoscopy Indications:              Surveillance: Personal history of adenomatous                            polyps on last colonoscopy 5 years ago Medicines:                Propofol per Anesthesia, Monitored Anesthesia Care Procedure:                Pre-Anesthesia Assessment:                           - Prior to the procedure, a History and Physical                            was performed, and patient medications and                            allergies were reviewed. The patient's tolerance of                            previous anesthesia was also reviewed. The risks                            and benefits of the procedure and the sedation                            options and risks were discussed with the patient.                            All questions were answered, and informed consent                            was obtained. Prior Anticoagulants: The patient has                            taken no previous anticoagulant or antiplatelet                            agents. ASA Grade Assessment: II - A patient with                            mild systemic disease. After reviewing the risks                            and benefits, the patient was deemed in                            satisfactory condition to undergo the procedure.  After obtaining informed consent, the colonoscope                            was passed under direct vision. Throughout the                            procedure, the patient's blood pressure, pulse, and                            oxygen saturations were monitored continuously. The                            Colonoscope was introduced through the anus and                             advanced to the the cecum, identified by                            appendiceal orifice and ileocecal valve. The                            ileocecal valve, appendiceal orifice, and rectum                            were photographed. The quality of the bowel                            preparation was excellent. The bowel preparation                            used was Miralax via split dose instruction. The                            colonoscopy was performed without difficulty. The                            patient tolerated the procedure well. Scope In: 9:23:09 AM Scope Out: 9:43:54 AM Scope Withdrawal Time: 0 hours 8 minutes 31 seconds  Total Procedure Duration: 0 hours 20 minutes 45 seconds  Findings:                 The perianal and digital rectal examinations were                            normal.                           Multiple diverticula were found in the sigmoid                            colon.                           The exam was otherwise without abnormality on  direct and retroflexion views. Complications:            No immediate complications. Estimated blood loss:                            None. Estimated Blood Loss:     Estimated blood loss: none. Impression:               - Diverticulosis in the sigmoid colon.                           - The examination was otherwise normal on direct                            and retroflexion views.                           - No specimens collected.                           - Personal history of colonic polyps. 5 mm adenoma                            2016, none in 2011 and reported hx before - no                            documentation available Recommendation:           - Resume previous diet.                           - Continue present medications.                           - No repeat colonoscopy due to age and the absence                            of colonic polyps. Gatha Mayer,  MD 10/22/2019 9:55:43 AM This report has been signed electronically.

## 2019-10-22 NOTE — Progress Notes (Signed)
Pt's states no medical or surgical changes since previsit or office visit. 

## 2019-10-24 ENCOUNTER — Telehealth: Payer: Self-pay

## 2019-10-24 NOTE — Telephone Encounter (Signed)
°  Follow up Call-  Call back number 10/22/2019  Post procedure Call Back phone  # [phone]  Permission to leave phone message Yes  Some recent data might be hidden     Patient questions:  Do you have a fever, pain , or abdominal swelling? No. Pain Score  0 *  Have you tolerated food without any problems? Yes.    Have you been able to return to your normal activities? Yes.    Do you have any questions about your discharge instructions: Diet   No. Medications  No. Follow up visit  No.  Do you have questions or concerns about your Care? No.  Actions: * If pain score is 4 or above: 1. No action needed, pain <4.Have you developed a fever since your procedure? no  2.   Have you had an respiratory symptoms (SOB or cough) since your procedure? no  3.   Have you tested positive for COVID 19 since your procedure no  4.   Have you had any family members/close contacts diagnosed with the COVID 19 since your procedure?  no   If yes to any of these questions please route to Joylene John, RN and Erenest Rasher, RN

## 2020-02-08 ENCOUNTER — Other Ambulatory Visit: Payer: Medicare Other

## 2020-02-08 DIAGNOSIS — Z20828 Contact with and (suspected) exposure to other viral communicable diseases: Secondary | ICD-10-CM | POA: Diagnosis not present

## 2020-04-04 DIAGNOSIS — Z23 Encounter for immunization: Secondary | ICD-10-CM | POA: Diagnosis not present

## 2020-05-04 ENCOUNTER — Encounter: Payer: Self-pay | Admitting: Family Medicine

## 2020-05-05 ENCOUNTER — Other Ambulatory Visit: Payer: Self-pay

## 2020-05-05 ENCOUNTER — Ambulatory Visit (INDEPENDENT_AMBULATORY_CARE_PROVIDER_SITE_OTHER): Payer: Medicare Other | Admitting: Nurse Practitioner

## 2020-05-05 VITALS — BP 124/80 | HR 100 | Temp 97.2°F | Ht 61.0 in | Wt 131.0 lb

## 2020-05-05 DIAGNOSIS — L509 Urticaria, unspecified: Secondary | ICD-10-CM | POA: Insufficient documentation

## 2020-05-05 MED ORDER — METHYLPREDNISOLONE ACETATE 80 MG/ML IJ SUSP: 40.0000 mg | Freq: Once | INTRAMUSCULAR | Status: DC

## 2020-05-05 MED ORDER — PREDNISONE 10 MG PO TABS: ORAL_TABLET | ORAL | 0 refills | Status: AC

## 2020-05-05 NOTE — Progress Notes (Signed)
Subjective:    Patient ID: Terri Alexander, female    DOB: 1951-02-19, 69 y.o.   MRN: [id number]  HPI: Terri Alexander is a 69 y.o. female presenting for  Chief Complaint  Patient presents with  . Pruritis    Not sure if she is having a reaction to sitting of son's dog this past wkn. Itching from breast down. Asking for shot for itching. Using triamcinolone cream for the itching.   RASH Duration:  days  Location: bilateral trunk below breast and down  Itching: yes Burning: yes Redness: yes Oozing: no Scaling: no Blisters: no Painful: yes Fevers: no Change in detergents/soaps/personal care products: no; recently watched a dog that she had been exposed to in the past Recent illness: no Recent travel:no History of same: no Context: worse Alleviating factors: oatmeal bath, benadryl q4 hours  Treatments attempted: oatmeal bath, benadryl q4 hours Shortness of breath: no  Throat/tongue swelling: no Myalgias/arthralgias: no  No Known Allergies  Outpatient Encounter Medications as of 05/05/2020  Medication Sig Note  . alendronate (FOSAMAX) 70 MG tablet Take 1 tablet (70 mg total) by mouth every 7 (seven) days. Take with a full glass of water on an empty stomach. 10/08/2019: Takes on monday  . aspirin 81 MG tablet Take 81 mg by mouth daily.   . Biotin 1000 MCG CHEW Chew by mouth.   . bisacodyl (BISACODYL) 5 MG EC tablet Take 5 mg by mouth daily as needed for moderate constipation. Dulcolax 5 mg tab take as directed for colonoscopy prep.   . Calcium Carb-Cholecalciferol (CALCIUM 600 + D PO) Take 600 mg by mouth once.   . chlorpheniramine (CHLOR-TRIMETON) 4 MG tablet Take 4 mg by mouth 2 (two) times daily as needed for allergies.   . Cholecalciferol (VITAMIN D) 50 MCG (2000 UT) CAPS Take by mouth.   . co-enzyme Q-10 30 MG capsule Take 30 mg by mouth 3 (three) times daily.   . Cranberry-Cholecalciferol 4200-500 MG-UNIT CAPS Take by mouth.   . Cyanocobalamin (VITAMIN B 12 PO) Take 1,000  mcg by mouth daily.   Marland Kitchen docusate sodium (COLACE) 250 MG capsule Take 250 mg by mouth daily.   Marland Kitchen LYSINE PO Take by mouth.   . Methylcellulose, Laxative, (FIBER THERAPY PO) Take by mouth.   . Multiple Vitamin (MULTIVITAMIN) tablet Take 1 tablet by mouth daily.   . Probiotic Product (PROBIOTIC PO) Take by mouth.   . simvastatin (ZOCOR) 10 MG tablet Take 1 tablet (10 mg total) by mouth at bedtime.   . triamcinolone cream (KENALOG) 0.1 % Apply 1 application topically 2 (two) times daily.   . vitamin C (ASCORBIC ACID) 500 MG tablet Take 500 mg by mouth daily.   . predniSONE (DELTASONE) 10 MG tablet Take 6 tablets (60 mg total) by mouth daily with breakfast for 1 day, THEN 5 tablets (50 mg total) daily with breakfast for 1 day, THEN 4 tablets (40 mg total) daily with breakfast for 1 day, THEN 3 tablets (30 mg total) daily with breakfast for 1 day, THEN 2 tablets (20 mg total) daily with breakfast for 1 day, THEN 1 tablet (10 mg total) daily with breakfast for 1 day.    Facility-Administered Encounter Medications as of 05/05/2020  Medication  . methylPREDNISolone acetate (DEPO-MEDROL) injection 40 mg    Patient Active Problem List   Diagnosis Date Noted  . Full body hives 05/05/2020  . Dyslipidemia 03/24/2018  . Postmenopausal estrogen deficiency 03/24/2018  . Squamous cell carcinoma 08/10/2017  .  HSV-1 (herpes simplex virus 1) infection 03/16/2017  . Osteoporosis 04/15/2016  . Vulvar atrophy 11/07/2014  . Abnormal Pap smear of cervix 11/07/2014    Past Medical History:  Diagnosis Date  . Adenomatous colon polyp   . Allergy   . Anemia    as a child, no current problem  . Blood transfusion without reported diagnosis    21 months old  . Cataract    bilateral - MD just watching   . GERD (gastroesophageal reflux disease)   . Hyperlipidemia   . Osteoporosis 04/15/2016  . Skin cancer 2013   Skin cancer- Nose, face    Relevant past medical, surgical, family and social history reviewed  and updated as indicated. Interim medical history since our last visit reviewed.  Review of Systems  Constitutional: Negative.  Negative for chills, fatigue and fever.  HENT: Negative for trouble swallowing.   Respiratory: Negative.  Negative for shortness of breath.   Musculoskeletal: Negative.  Negative for arthralgias, joint swelling and myalgias.  Skin: Positive for color change (red areas to trunk, back, legs) and rash. Negative for pallor and wound.  Hematological: Negative.  Negative for adenopathy.  Psychiatric/Behavioral: Negative for agitation and confusion. The patient is nervous/anxious.     Per HPI unless specifically indicated above     Objective:    BP 124/80   Pulse 100   Temp (!) 97.2 F (36.2 C)   Ht 5\' 1"  (1.549 m)   Wt 131 lb (59.4 kg)   LMP  (LMP Unknown)   SpO2 99%   BMI 24.75 kg/m   Wt Readings from Last 3 Encounters:  05/05/20 131 lb (59.4 kg)  10/22/19 126 lb (57.2 kg)  10/08/19 126 lb 6.4 oz (57.3 kg)    Physical Exam Vitals and nursing note reviewed.  Constitutional:      General: She is not in acute distress.    Appearance: Normal appearance. She is normal weight. She is not toxic-appearing.  HENT:     Head: Normocephalic and atraumatic.  Pulmonary:     Effort: Pulmonary effort is normal. No respiratory distress.     Comments: Talking in complete sentences without accessory muscle use Skin:    General: Skin is warm and dry.     Capillary Refill: Capillary refill takes less than 2 seconds.     Coloration: Skin is not jaundiced.     Findings: Erythema and rash (erythematous, circular lesions varying in size located on lower extremities, lower back, abdomen, buttocks) present. No abrasion, abscess, bruising, signs of injury, lesion, petechiae or wound. Rash is papular, purpuric and urticarial.  Neurological:     Mental Status: She is alert and oriented to person, place, and time.     Motor: No weakness.     Gait: Gait normal.  Psychiatric:         Mood and Affect: Mood normal.        Behavior: Behavior normal.        Thought Content: Thought content normal.        Judgment: Judgment normal.       Assessment & Plan:   Problem List Items Addressed This Visit      Musculoskeletal and Integument   Full body hives - Primary    Acute, ongoing.  Perhaps due to exposure to dog she came into contact with over weekend.  No red flags in history or on examination.  Will treated with Depo-medrol 40 mg IM in office today and then tomorrow start  prednisone taper.  If symptoms continue to persist halfway through prednisone taper, to let us know so we can extend steroids.  Return to clinic if symptoms persist.  Continue use of antihistamine and can use emollient like vaseline to help coat and protect skin.      Relevant Medications   methylPREDNISolone acetate (DEPO-MEDROL) injection 40 mg   predniSONE (DELTASONE) 10 MG tablet       Follow up plan: Return if symptoms worsen or fail to improve.

## 2020-05-05 NOTE — Patient Instructions (Signed)
Hives Hives (urticaria) are itchy, red, swollen areas on the skin. Hives can appear on any part of the body. Hives often fade within 24 hours (acute hives). Sometimes, new hives appear after old ones fade and the cycle can continue for several days or weeks (chronic hives). Hives do not spread from person to person (are not contagious). Hives come from the body's reaction to something a person is allergic to (allergen), something that causes irritation, or various other triggers. When a person is exposed to a trigger, his or her body releases a chemical (histamine) that causes redness, itching, and swelling. Hives can appear right after exposure to a trigger or hours later. What are the causes? This condition may be caused by:  Allergies to foods or ingredients.  Insect bites or stings.  Exposure to pollen or pets.  Contact with latex or chemicals.  Spending time in sunlight, heat, or cold (exposure).  Exercise.  Stress.  Certain medicines. You can also get hives from other medical conditions and treatments, such as:  Viruses, including the common cold.  Bacterial infections, such as urinary tract infections and strep throat.  Certain medicines.  Allergy shots.  Blood transfusions. Sometimes, the cause of this condition is not known (idiopathic hives). What increases the risk? You are more likely to develop this condition if you:  Are a woman.  Have food allergies, especially to citrus fruits, milk, eggs, peanuts, tree nuts, or shellfish.  Are allergic to: ? Medicines. ? Latex. ? Insects. ? Animals. ? Pollen. What are the signs or symptoms? Common symptoms of this condition include raised, itchy, red or white bumps or patches on your skin. These areas may:  Become large and swollen (welts).  Change in shape and location, quickly and repeatedly.  Be separate hives or connect over a large area of skin.  Sting or become painful.  Turn white when pressed in the  center (blanch). In severe cases, yourhands, feet, and face may also become swollen. This may occur if hives develop deeper in your skin. How is this diagnosed? This condition may be diagnosed by your symptoms, medical history, and physical exam.  Your skin, urine, or blood may be tested to find out what is causing your hives and to rule out other health issues.  Your health care provider may also remove a small sample of skin from the affected area and examine it under a microscope (biopsy). How is this treated? Treatment for this condition depends on the cause and severity of your symptoms. Your health care provider may recommend using cool, wet cloths (cool compresses) or taking cool showers to relieve itching. Treatment may include:  Medicines that help: ? Relieve itching (antihistamines). ? Reduce swelling (corticosteroids). ? Treat infection (antibiotics).  An injectable medicine (omalizumab). Your health care provider may prescribe this if you have chronic idiopathic hives and you continue to have symptoms even after treatment with antihistamines. Severe cases may require an emergency injection of adrenaline (epinephrine) to prevent a life-threatening allergic reaction (anaphylaxis). Follow these instructions at home: Medicines  Take and apply over-the-counter and prescription medicines only as told by your health care provider.  If you were prescribed an antibiotic medicine, take it as told by your health care provider. Do not stop using the antibiotic even if you start to feel better. Skin care  Apply cool compresses to the affected areas.  Do not scratch or rub your skin. General instructions  Do not take hot showers or baths. This can make itching   worse.  Do not wear tight-fitting clothing.  Use sunscreen and wear protective clothing when you are outside.  Avoid any substances that cause your hives. Keep a journal to help track what causes your hives. Write  down: ? What medicines you take. ? What you eat and drink. ? What products you use on your skin.  Keep all follow-up visits as told by your health care provider. This is important. Contact a health care provider if:  Your symptoms are not controlled with medicine.  Your joints are painful or swollen. Get help right away if:  You have a fever.  You have pain in your abdomen.  Your tongue or lips are swollen.  Your eyelids are swollen.  Your chest or throat feels tight.  You have trouble breathing or swallowing. These symptoms may represent a serious problem that is an emergency. Do not wait to see if the symptoms will go away. Get medical help right away. Call your local emergency services (911 in the [place] Do not drive yourself to the hospital. Summary  Hives (urticaria) are itchy, red, swollen areas on your skin. Hives come from the body's reaction to something a person is allergic to (allergen), something that causes irritation, or various other triggers.  Treatment for this condition depends on the cause and severity of your symptoms.  Avoid any substances that cause your hives. Keep a journal to help track what causes your hives.  Take and apply over-the-counter and prescription medicines only as told by your health care provider.  Keep all follow-up visits as told by your health care provider. This is important. This information is not intended to replace advice given to you by your health care provider. Make sure you discuss any questions you have with your health care provider. Document Revised: 11/16/2017 Document Reviewed: 11/16/2017 Elsevier Patient Education  2020 [employer]  

## 2020-05-05 NOTE — Assessment & Plan Note (Addendum)
Acute, ongoing.  Perhaps due to exposure to dog she came into contact with over weekend.  No red flags in history or on examination.  Will treated with Depo-medrol 40 mg IM in office today and then tomorrow start prednisone taper.  If symptoms continue to persist halfway through prednisone taper, to let us know so we can extend steroids.  Return to clinic if symptoms persist.  Continue use of antihistamine and can use emollient like vaseline to help coat and protect skin.

## 2020-05-06 MED ORDER — METHYLPREDNISOLONE ACETATE 40 MG/ML IJ SUSP
40.0000 mg | Freq: Once | INTRAMUSCULAR | Status: AC
  Administered 2020-05-05: 40 mg via INTRAMUSCULAR

## 2020-05-06 NOTE — Addendum Note (Signed)
Addended by: Sheral Flow on: 05/06/2020 08:18 AM   Modules accepted: Orders

## 2020-05-20 ENCOUNTER — Other Ambulatory Visit: Payer: Self-pay | Admitting: Family Medicine

## 2020-05-20 DIAGNOSIS — Z1231 Encounter for screening mammogram for malignant neoplasm of breast: Secondary | ICD-10-CM

## 2020-05-27 ENCOUNTER — Encounter: Payer: Self-pay | Admitting: Family Medicine

## 2020-06-02 ENCOUNTER — Encounter: Payer: Medicare Other | Admitting: Family Medicine

## 2020-06-11 ENCOUNTER — Encounter: Payer: Self-pay | Admitting: Family Medicine

## 2020-06-11 ENCOUNTER — Ambulatory Visit (INDEPENDENT_AMBULATORY_CARE_PROVIDER_SITE_OTHER): Payer: Medicare Other | Admitting: Family Medicine

## 2020-06-11 ENCOUNTER — Other Ambulatory Visit: Payer: Self-pay

## 2020-06-11 VITALS — BP 124/66 | HR 76 | Temp 97.8°F | Resp 14 | Ht 61.0 in | Wt 130.0 lb

## 2020-06-11 DIAGNOSIS — E785 Hyperlipidemia, unspecified: Secondary | ICD-10-CM | POA: Diagnosis not present

## 2020-06-11 DIAGNOSIS — M810 Age-related osteoporosis without current pathological fracture: Secondary | ICD-10-CM

## 2020-06-11 DIAGNOSIS — Z0000 Encounter for general adult medical examination without abnormal findings: Secondary | ICD-10-CM | POA: Diagnosis not present

## 2020-06-11 DIAGNOSIS — Z0001 Encounter for general adult medical examination with abnormal findings: Secondary | ICD-10-CM | POA: Diagnosis not present

## 2020-06-11 NOTE — Progress Notes (Signed)
Subjective:   Patient presents for Medicare Annual/Subsequent preventive examination.  Patient here for annual wellness exam.  Medications and history reviewed.  Osteoporosis currently on-Fosamax as well as calcium and vitamin D / due for repeat Bone Density   History of skin cancer she is followed by dermatology, history of SCC   Hyperlipidemia currently diet-controlled.  Followed by Eye doctor- has history of holes in both Retina that has been lasered   Since our last visit she was treated for hives by our NP, she initially thought it was it was her dog, but now recalls having some stressors she had a bout of diverticulitis and vertigo during December as well prior to hives breaking out  She had diverticulitis flare for about 2 days,  She had meds on hand but didn't not need   Her mother passed away   She did have an episode of vertigo in December - she turned over and the room started spiinning she took motion sickness tab and it helped   She has intermittent issues with balance for many years but nothing worrisome also has had palpitations very short lived for years, politely declines seeing cardiology   Review Past Medical/Family/Social: Per EMR    Risk Factors  Current exercise habits: Walks Dietary issues discussed: discussed cholesterol   Cardiac risk factors: Hyperlipidemia  Depression Screen  (Note: if answer to either of the following is "Yes", a more complete depression screening is indicated)  Over the past two weeks, have you felt down, depressed or hopeless? No Over the past two weeks, have you felt little interest or pleasure in doing things? No Have you lost interest or pleasure in daily life? No Do you often feel hopeless? No Do you cry easily over simple problems? No   Activities of Daily Living  In your present state of health, do you have any difficulty performing the following activities?:  Driving? No  Managing money? No  Feeding yourself?  No  Getting from bed to chair? No  Climbing a flight of stairs? No  Preparing food and eating?: No  Bathing or showering? No  Getting dressed: No  Getting to the toilet? No  Using the toilet:No  Moving around from place to place: No  In the past year have you fallen or had a near fall?:No    Hearing Difficulties: No  Do you often ask people to speak up or repeat themselves? No  Do you experience ringing or noises in your ears? No Do you have difficulty understanding soft or whispered voices? No  Do you feel that you have a problem with memory? No Do you often misplace items? No  Do you feel safe at home? Yes  Cognitive Testing  Alert? Yes Normal Appearance?Yes  Oriented to person? Yes Place? Yes  Time? Yes  Recall of three objects? Yes  Can perform simple calculations? Yes  Displays appropriate judgment?Yes  Can read the correct time from a watch face?Yes   List the Names of Other Physician/Practitioners you currently use:  Dermatology Dr. Pearline Cables Ophthalmology- Dr. Noel Journey GI- Dr. Carlean Purl   Screening Tests / Date Colonoscopy     UD 2016                 Pneumonia- UTD PAP Smear normal  Mammogram up-to-date , scheduled for Feb  Bone density done December 2019 Due  Influenza Vaccine-  COVID 19 UTD Shingles-  UTD Tetanus/tdap UTD  ROS: GEN- denies fatigue, fever, weight loss,weakness, recent illness HEENT- denies  eye drainage, change in vision, nasal discharge, CVS- denies chest pain, palpitations RESP- denies SOB, cough, wheeze ABD- denies N/V, change in stools, abd pain GU- denies dysuria, hematuria, dribbling, incontinence MSK- denies joint pain, muscle aches, injury Neuro- denies headache, dizziness, syncope, seizure activity  Physical: vitals reviewed  GEN- NAD, alert and oriented x3 HEENT- PERRL, EOMI, non injected sclera, pink conjunctiva, MMM, oropharynx clear Neck- Supple, no thryomegalym no bruit CVS- RRR, no  murmur RESP-CTAB ABD-NABS,soft,ND,NT PSYCH- stressed appearing, not anxious, well groomed, good eye contact  EXT- No edema Pulses- Radial, DP- 2+     Assessment:    Annual wellness medicare exam   Plan:    During the course of the visit the patient was educated and counseled about appropriate screening and preventive services including:  CPE done,fasting labs done   Fall/ audit C/Depression screen negative  Osteoporosis-continue fosamax/ calcium and vitamin D, Plan for bone density in Feb with mammogram   History of skin cancer- followed by dermatology   Hyperlipidemia- increasing risk factor for heart diease,on zocor 10mg  once a day   Hives resolved  Full Code , Pt has advanced directives        Diet review for nutrition referral? Yes ____ Not Indicated __x__  Patient Instructions (the written plan) was given to the patient.  Medicare Attestation  I have personally reviewed:  The patient's medical and social history  Their use of alcohol, tobacco or illicit drugs  Their current medications and supplements  The patient's functional ability including ADLs,fall risks, home safety risks, cognitive, and hearing and visual impairment  Diet and physical activities  Evidence for depression or mood disorders  The patient's weight, height, BMI, and visual acuity have been recorded in the chart. I have made referrals, counseling, and provided education to the patient based on review of the above and I have provided the patient with a written personalized care plan for preventive services.

## 2020-06-11 NOTE — Patient Instructions (Addendum)
Schedule a bone density  We will call with lab results  F/U 1 year for physical

## 2020-06-12 ENCOUNTER — Encounter: Payer: Self-pay | Admitting: *Deleted

## 2020-06-12 LAB — LIPID PANEL
Cholesterol: 181 mg/dL (ref <200)
HDL: 68 mg/dL (ref >=50)
LDL Cholesterol (Calc): 90 mg/dL (calc)
Non-HDL Cholesterol (Calc): 113 mg/dL (calc) (ref <130)
Total CHOL/HDL Ratio: 2.7 (calc) (ref <5.0)
Triglycerides: 136 mg/dL (ref <150)

## 2020-06-12 LAB — COMPREHENSIVE METABOLIC PANEL
AG Ratio: 1.8 (calc) (ref 1.0–2.5)
ALT: 14 U/L (ref 6–29)
AST: 16 U/L (ref 10–35)
Albumin: 4.4 g/dL (ref 3.6–5.1)
Alkaline phosphatase (APISO): 64 U/L (ref 37–153)
BUN: 18 mg/dL (ref 7–25)
CO2: 32 mmol/L (ref 20–32)
Calcium: 9.4 mg/dL (ref 8.6–10.4)
Chloride: 105 mmol/L (ref 98–110)
Creat: 0.68 mg/dL (ref 0.60–0.93)
Globulin: 2.5 g/dL (calc) (ref 1.9–3.7)
Glucose, Bld: 98 mg/dL (ref 65–99)
Potassium: 4.8 mmol/L (ref 3.5–5.3)
Sodium: 143 mmol/L (ref 135–146)
Total Bilirubin: 0.5 mg/dL (ref 0.2–1.2)
Total Protein: 6.9 g/dL (ref 6.1–8.1)

## 2020-06-12 LAB — CBC WITH DIFFERENTIAL/PLATELET
Absolute Monocytes: 958 cells/uL — ABNORMAL HIGH (ref 200–950)
Basophils Absolute: 67 cells/uL (ref 0–200)
Basophils Relative: 0.8 %
Eosinophils Absolute: 328 cells/uL (ref 15–500)
Eosinophils Relative: 3.9 %
HCT: 41.7 % (ref 35.0–45.0)
Hemoglobin: 13.7 g/dL (ref 11.7–15.5)
Lymphs Abs: 2092 cells/uL (ref 850–3900)
MCH: 30.2 pg (ref 27.0–33.0)
MCHC: 32.9 g/dL (ref 32.0–36.0)
MCV: 92.1 fL (ref 80.0–100.0)
MPV: 10.2 fL (ref 7.5–12.5)
Monocytes Relative: 11.4 %
Neutro Abs: 4956 cells/uL (ref 1500–7800)
Neutrophils Relative %: 59 %
Platelets: 362 10*3/uL (ref 140–400)
RBC: 4.53 10*6/uL (ref 3.80–5.10)
RDW: 13.4 % (ref 11.0–15.0)
Total Lymphocyte: 24.9 %
WBC: 8.4 10*3/uL (ref 3.8–10.8)

## 2020-06-12 LAB — VITAMIN D 25 HYDROXY (VIT D DEFICIENCY, FRACTURES): Vit D, 25-Hydroxy: 61 ng/mL (ref 30–100)

## 2020-06-13 ENCOUNTER — Encounter: Payer: Self-pay | Admitting: Family Medicine

## 2020-06-30 ENCOUNTER — Ambulatory Visit
Admission: RE | Admit: 2020-06-30 | Discharge: 2020-06-30 | Disposition: A | Discharge status: Home / Self Care | Payer: Medicare Other | Source: Ambulatory Visit | Attending: Family Medicine | Admitting: Family Medicine

## 2020-06-30 ENCOUNTER — Other Ambulatory Visit: Payer: Self-pay

## 2020-06-30 DIAGNOSIS — Z1231 Encounter for screening mammogram for malignant neoplasm of breast: Secondary | ICD-10-CM

## 2020-07-01 ENCOUNTER — Telehealth: Payer: Self-pay | Admitting: *Deleted

## 2020-07-01 NOTE — Telephone Encounter (Signed)
Call placed to patient. LMTRC.  

## 2020-07-01 NOTE — Telephone Encounter (Signed)
-----   Message from Susy Frizzle, MD sent at 06/30/2020  6:33 AM EST ----- Regarding: RE: New Patient ok ----- Message ----- From: Sheral Flow, LPN Sent: 10/02/8414  10:46 AM EST To: Alycia Rossetti, MD, Susy Frizzle, MD Subject: New Patient                                    Patient has been being seen by Dr. Buelah Manis, but spouse is seen by you. Patient is requesting to transfer to you.   Please advise.

## 2020-07-02 ENCOUNTER — Other Ambulatory Visit: Payer: Self-pay | Admitting: *Deleted

## 2020-07-02 MED ORDER — SIMVASTATIN 10 MG PO TABS: 10.0000 mg | ORAL_TABLET | Freq: Every day | ORAL | 3 refills | Status: DC

## 2020-07-02 NOTE — Telephone Encounter (Signed)
Call placed to patient and patient made aware.  

## 2020-07-04 ENCOUNTER — Other Ambulatory Visit: Payer: Self-pay | Admitting: Family Medicine

## 2020-07-04 DIAGNOSIS — R928 Other abnormal and inconclusive findings on diagnostic imaging of breast: Secondary | ICD-10-CM

## 2020-07-17 ENCOUNTER — Ambulatory Visit
Admission: RE | Admit: 2020-07-17 | Discharge: 2020-07-17 | Disposition: A | Discharge status: Home / Self Care | Payer: Medicare Other | Source: Ambulatory Visit | Attending: Family Medicine | Admitting: Family Medicine

## 2020-07-17 ENCOUNTER — Other Ambulatory Visit: Payer: Self-pay

## 2020-07-17 DIAGNOSIS — R922 Inconclusive mammogram: Secondary | ICD-10-CM | POA: Diagnosis not present

## 2020-07-17 DIAGNOSIS — N6002 Solitary cyst of left breast: Secondary | ICD-10-CM | POA: Diagnosis not present

## 2020-07-17 DIAGNOSIS — R928 Other abnormal and inconclusive findings on diagnostic imaging of breast: Secondary | ICD-10-CM

## 2020-10-16 ENCOUNTER — Ambulatory Visit
Admission: RE | Admit: 2020-10-16 | Discharge: 2020-10-16 | Disposition: A | Discharge status: Home / Self Care | Payer: Medicare Other | Source: Ambulatory Visit | Attending: Family Medicine | Admitting: Family Medicine

## 2020-10-16 ENCOUNTER — Other Ambulatory Visit: Payer: Self-pay

## 2020-10-16 DIAGNOSIS — Z780 Asymptomatic menopausal state: Secondary | ICD-10-CM | POA: Diagnosis not present

## 2020-10-16 DIAGNOSIS — M810 Age-related osteoporosis without current pathological fracture: Secondary | ICD-10-CM

## 2020-10-16 DIAGNOSIS — M8589 Other specified disorders of bone density and structure, multiple sites: Secondary | ICD-10-CM | POA: Diagnosis not present

## 2020-10-17 ENCOUNTER — Encounter: Payer: Self-pay | Admitting: Family Medicine

## 2020-10-17 DIAGNOSIS — M810 Age-related osteoporosis without current pathological fracture: Secondary | ICD-10-CM

## 2020-10-20 MED ORDER — ALENDRONATE SODIUM 70 MG PO TABS: 70.0000 mg | ORAL_TABLET | ORAL | 3 refills | Status: DC

## 2020-12-09 ENCOUNTER — Encounter (HOSPITAL_COMMUNITY): Payer: Self-pay

## 2020-12-09 ENCOUNTER — Ambulatory Visit (HOSPITAL_COMMUNITY)
Admission: EM | Admit: 2020-12-09 | Discharge: 2020-12-09 | Disposition: A | Discharge status: Home / Self Care | Payer: Medicare Other | Attending: Internal Medicine | Admitting: Internal Medicine

## 2020-12-09 ENCOUNTER — Telehealth (HOSPITAL_COMMUNITY): Payer: Self-pay | Admitting: Family Medicine

## 2020-12-09 ENCOUNTER — Other Ambulatory Visit: Payer: Self-pay

## 2020-12-09 DIAGNOSIS — K648 Other hemorrhoids: Secondary | ICD-10-CM | POA: Diagnosis not present

## 2020-12-09 DIAGNOSIS — R197 Diarrhea, unspecified: Secondary | ICD-10-CM | POA: Insufficient documentation

## 2020-12-09 DIAGNOSIS — N898 Other specified noninflammatory disorders of vagina: Secondary | ICD-10-CM | POA: Diagnosis not present

## 2020-12-09 DIAGNOSIS — R1030 Lower abdominal pain, unspecified: Secondary | ICD-10-CM | POA: Diagnosis not present

## 2020-12-09 DIAGNOSIS — K625 Hemorrhage of anus and rectum: Secondary | ICD-10-CM | POA: Diagnosis not present

## 2020-12-09 DIAGNOSIS — R112 Nausea with vomiting, unspecified: Secondary | ICD-10-CM | POA: Insufficient documentation

## 2020-12-09 LAB — LIPASE, BLOOD: Lipase: 25 U/L (ref 11–51)

## 2020-12-09 LAB — COMPREHENSIVE METABOLIC PANEL
ALT: 25 U/L (ref 0–44)
AST: 22 U/L (ref 15–41)
Albumin: 4.2 g/dL (ref 3.5–5.0)
Alkaline Phosphatase: 80 U/L (ref 38–126)
Anion gap: 8 (ref 5–15)
BUN: 14 mg/dL (ref 8–23)
CO2: 27 mmol/L (ref 22–32)
Calcium: 9 mg/dL (ref 8.9–10.3)
Chloride: 103 mmol/L (ref 98–111)
Creatinine, Ser: 0.76 mg/dL (ref 0.44–1.00)
GFR, Estimated: >60 mL/min (ref >60)
Glucose, Bld: 113 mg/dL — ABNORMAL HIGH (ref 70–99)
Potassium: 3.8 mmol/L (ref 3.5–5.1)
Sodium: 138 mmol/L (ref 135–145)
Total Bilirubin: 0.9 mg/dL (ref 0.3–1.2)
Total Protein: 7.4 g/dL (ref 6.5–8.1)

## 2020-12-09 LAB — CBC WITH DIFFERENTIAL/PLATELET
Abs Immature Granulocytes: 0.07 10*3/uL (ref 0.00–0.07)
Basophils Absolute: 0.1 10*3/uL (ref 0.0–0.1)
Basophils Relative: 0 %
Eosinophils Absolute: 0 10*3/uL (ref 0.0–0.5)
Eosinophils Relative: 0 %
HCT: 41.9 % (ref 36.0–46.0)
Hemoglobin: 14 g/dL (ref 12.0–15.0)
Immature Granulocytes: 0 %
Lymphocytes Relative: 12 %
Lymphs Abs: 1.9 10*3/uL (ref 0.7–4.0)
MCH: 30.4 pg (ref 26.0–34.0)
MCHC: 33.4 g/dL (ref 30.0–36.0)
MCV: 91.1 fL (ref 80.0–100.0)
Monocytes Absolute: 1.3 10*3/uL — ABNORMAL HIGH (ref 0.1–1.0)
Monocytes Relative: 8 %
Neutro Abs: 12.6 10*3/uL — ABNORMAL HIGH (ref 1.7–7.7)
Neutrophils Relative %: 80 %
Platelets: 312 10*3/uL (ref 150–400)
RBC: 4.6 MIL/uL (ref 3.87–5.11)
RDW: 14.7 % (ref 11.5–15.5)
WBC: 15.9 10*3/uL — ABNORMAL HIGH (ref 4.0–10.5)
nRBC: 0 % (ref 0.0–0.2)

## 2020-12-09 LAB — POCT URINALYSIS DIPSTICK, ED / UC
Bilirubin Urine: NEGATIVE
Glucose, UA: NEGATIVE mg/dL
Ketones, ur: NEGATIVE mg/dL
Leukocytes,Ua: NEGATIVE
Nitrite: NEGATIVE
Protein, ur: NEGATIVE mg/dL
Specific Gravity, Urine: 1.02 (ref 1.005–1.030)
Urobilinogen, UA: 0.2 mg/dL (ref 0.0–1.0)
pH: 7 (ref 5.0–8.0)

## 2020-12-09 MED ORDER — METRONIDAZOLE 500 MG PO TABS: 500.0000 mg | ORAL_TABLET | Freq: Three times a day (TID) | ORAL | 0 refills | Status: DC

## 2020-12-09 MED ORDER — HYDROCORTISONE ACETATE 25 MG RE SUPP: 25.0000 mg | Freq: Two times a day (BID) | RECTAL | 0 refills | Status: DC

## 2020-12-09 MED ORDER — CIPROFLOXACIN HCL 500 MG PO TABS: 500.0000 mg | ORAL_TABLET | Freq: Two times a day (BID) | ORAL | 0 refills | Status: DC

## 2020-12-09 NOTE — ED Triage Notes (Signed)
Pt presents with lower abdominal pain, diarrhea and c/o having a hemorrhoid X 24 hours.  .  States she has been burping a lot for the last several months.

## 2020-12-09 NOTE — Discharge Instructions (Addendum)
Try probiotics, fiber supplements, bland diet, lots of fluids.  Follow-up with your primary care provider for a recheck and return for evaluation immediately if symptoms worsening anytime.  We will be in touch with your lab results should there be abnormal findings

## 2020-12-09 NOTE — Telephone Encounter (Signed)
Called patient, identifiers utilized to verify identity prior to giving results.  Discussed lab results which are significant for an elevated white blood cell count.  She is continuing to be very symptomatic with lower abdominal pain, frequent diarrhea, decreased p.o. given these findings and her history of diverticulitis, will cover for this with Cipro and Flagyl regimen.  Follow-up with PCP for recheck and go to the emergency department if symptoms worsen at any point.

## 2020-12-09 NOTE — ED Provider Notes (Signed)
[facility]    CSN: [id number] Arrival date & time: 12/09/20  0831      History   Chief Complaint Chief Complaint  Patient presents with   Abdominal Pain   Diarrhea    HPI Terri Alexander is a 70 y.o. female.   Patient presenting today with bilateral lower abdominal pain, diarrhea, anal leakage, bright red blood when wiping after bowel movements that started last night.  She states the pain was significant overnight but is now minimal.  Denies fever, chills, sweats, vomiting, dizziness, syncope, urinary symptoms.  So far has not tried anything over-the-counter for symptoms.  Does have a history of diverticulitis but states this does not feel similar to that.  No other history of GI issues.  Symptoms started almost immediately after eating dinner last night but husband ate the same food and has not had any issues.  Known sick contacts recently.  Not trying thing over-the-counter for symptoms. She also states that she is having an abnormal vaginal odor, and she describes it as ammonia like or fishlike.  Denies significant notice of vaginal discharge, pelvic discomfort, itching, flank pain.  Not try anything over-the-counter for symptoms.   Past Medical History:  Diagnosis Date   Adenomatous colon polyp    Allergy    Anemia    as a child, no current problem   Blood transfusion without reported diagnosis    20 months old   Cataract    bilateral - MD just watching    GERD (gastroesophageal reflux disease)    Hyperlipidemia    Osteoporosis 04/15/2016   Skin cancer 2013   Skin cancer- Nose, face    Patient Active Problem List   Diagnosis Date Noted   Full body hives 05/05/2020   Dyslipidemia 03/24/2018   Postmenopausal estrogen deficiency 03/24/2018   Squamous cell carcinoma 08/10/2017   HSV-1 (herpes simplex virus 1) infection 03/16/2017   Osteoporosis 04/15/2016   Vulvar atrophy 11/07/2014   Abnormal Pap smear of cervix 11/07/2014    Past Surgical History:   Procedure Laterality Date   CESAREAN SECTION  1981   x 1   COLONOSCOPY  2016   hx polyp, tics, sigmoid colon mod tortuous Dr Boyd Kerbs,    EYE SURGERY Bilateral    retina repair x 2   LAPAROTOMY  1979   removal of ovarian cyst   nissan fundiplication  [id number]   REPAIR OF COMPLEX TRACTION RETINAL DETACHMENT Bilateral 01/2019    OB History   No obstetric history on file.      Home Medications    Prior to Admission medications   Medication Sig Start Date End Date Taking? Authorizing Provider  hydrocortisone (ANUSOL-HC) 25 MG suppository Place 1 suppository (25 mg total) rectally 2 (two) times daily. 12/09/20  Yes Volney American, PA-C  alendronate (FOSAMAX) 70 MG tablet Take 1 tablet (70 mg total) by mouth every 7 (seven) days. Take with a full glass of water on an empty stomach. 10/20/20   Susy Frizzle, MD  aspirin 81 MG tablet Take 81 mg by mouth daily.    [provider]  Biotin 1000 MCG CHEW Chew by mouth.    [provider]  bisacodyl (BISACODYL) 5 MG EC tablet Take 5 mg by mouth daily as needed for moderate constipation. Dulcolax 5 mg tab take as directed for colonoscopy prep.    [provider]  Calcium Carb-Cholecalciferol (CALCIUM 600 + D PO) Take 600 mg by mouth once.    [provider]  chlorpheniramine (CHLOR-TRIMETON) 4 MG tablet Take 4 mg by mouth 2 (two) times daily as needed for allergies.    [provider]  Cholecalciferol (VITAMIN D) 50 MCG (2000 UT) CAPS Take by mouth.    [provider]  ciprofloxacin (CIPRO) 500 MG tablet Take 1 tablet (500 mg total) by mouth 2 (two) times daily. 12/09/20   Volney American, PA-C  co-enzyme Q-10 30 MG capsule Take 30 mg by mouth 3 (three) times daily.    [provider]  Cranberry-Cholecalciferol 4200-500 MG-UNIT CAPS Take by mouth.    [provider]  Cyanocobalamin (VITAMIN B 12 PO) Take 1,000 mcg by mouth daily.    [provider]   docusate sodium (COLACE) 250 MG capsule Take 250 mg by mouth daily.    [provider]  LYSINE PO Take by mouth.    [provider]  Methylcellulose, Laxative, (FIBER THERAPY PO) Take by mouth.    [provider]  metroNIDAZOLE (FLAGYL) 500 MG tablet Take 1 tablet (500 mg total) by mouth 3 (three) times daily. 12/09/20   Volney American, PA-C  Multiple Vitamin (MULTIVITAMIN) tablet Take 1 tablet by mouth daily.    [provider]  Probiotic Product (PROBIOTIC PO) Take by mouth.    [provider]  simvastatin (ZOCOR) 10 MG tablet Take 1 tablet (10 mg total) by mouth at bedtime. 07/02/20   Alycia Rossetti, MD  triamcinolone cream (KENALOG) 0.1 % Apply 1 application topically 2 (two) times daily.    [provider]  vitamin C (ASCORBIC ACID) 500 MG tablet Take 500 mg by mouth daily.    [provider]    Family History Family History  Problem Relation Age of Onset   Arthritis Mother    Heart disease Mother    Vision loss Mother    Stroke Mother    Depression Sister    Cancer Maternal Grandmother    Cancer Maternal Grandfather    Early death Maternal Grandfather    Heart disease Maternal Grandfather    Early death Paternal Grandfather    Heart disease Paternal Grandfather    Depression Sister    Diabetes Other        Juvenile   Colon cancer Neg Hx    Rectal cancer Neg Hx    Prostate cancer Neg Hx     Social History Social History   Tobacco Use   Smoking status: Never   Smokeless tobacco: Never  Vaping Use   Vaping Use: Never used  Substance Use Topics   Alcohol use: Yes    Alcohol/week: 1.0 standard drink    Types: 1 Glasses of wine per week   Drug use: No     Allergies   Patient has no known allergies.   Review of Systems Review of Systems Per HPI  Physical Exam Triage Vital Signs ED Triage Vitals  Enc Vitals Group     BP 12/09/20 1004 (!) 142/80     Pulse Rate 12/09/20 1002 76      Resp 12/09/20 1002 20     Temp 12/09/20 1002 98.3 F (36.8 C)     Temp Source 12/09/20 1002 Oral     SpO2 12/09/20 1002 97 %     Weight --      Height --      Head Circumference --      Peak Flow --      Pain Score 12/09/20 1000 0  Pain Loc --      Pain Edu? --      Excl. in [place]? --    No data found.  Updated Vital Signs BP (!) 142/80   Pulse 76   Temp 98.3 F (36.8 C) (Oral)   Resp 20   LMP  (LMP Unknown)   SpO2 97%   Visual Acuity Right Eye Distance:   Left Eye Distance:   Bilateral Distance:    Right Eye Near:   Left Eye Near:    Bilateral Near:     Physical Exam Vitals and nursing note reviewed. Exam conducted with a chaperone present.  Constitutional:      Appearance: Normal appearance. She is not ill-appearing.  HENT:     Head: Atraumatic.  Eyes:     Extraocular Movements: Extraocular movements intact.     Conjunctiva/sclera: Conjunctivae normal.  Cardiovascular:     Rate and Rhythm: Normal rate and regular rhythm.     Heart sounds: Normal heart sounds.  Pulmonary:     Effort: Pulmonary effort is normal.     Breath sounds: Normal breath sounds.  Abdominal:     General: Bowel sounds are normal. There is no distension.     Palpations: Abdomen is soft.     Tenderness: There is abdominal tenderness. There is no right CVA tenderness, left CVA tenderness or guarding.     Comments: Minimal bilateral lower abdominal tenderness to palpation, no guarding or distention  Genitourinary:    Comments: Vaginal exam deferred, self swab performed Anal erythema, inflammation but no obvious external hemorrhoid, no active bleeding Musculoskeletal:        General: Normal range of motion.     Cervical back: Normal range of motion and neck supple.  Skin:    General: Skin is warm and dry.  Neurological:     Mental Status: She is alert and oriented to person, place, and time.  Psychiatric:        Mood and Affect: Mood normal.        Thought Content: Thought content  normal.        Judgment: Judgment normal.   UC Treatments / Results  Labs (all labs ordered are listed, but only abnormal results are displayed) Labs Reviewed  CBC WITH DIFFERENTIAL/PLATELET - Abnormal; Notable for the following components:      Result Value   WBC 15.9 (*)    Neutro Abs 12.6 (*)    Monocytes Absolute 1.3 (*)    All other components within normal limits  COMPREHENSIVE METABOLIC PANEL - Abnormal; Notable for the following components:   Glucose, Bld 113 (*)    All other components within normal limits  POCT URINALYSIS DIPSTICK, ED / UC - Abnormal; Notable for the following components:   Hgb urine dipstick TRACE (*)    All other components within normal limits  LIPASE, BLOOD  CERVICOVAGINAL ANCILLARY ONLY    EKG   Radiology No results found.  Procedures Procedures (including critical care time)  Medications Ordered in UC Medications - No data to display  Initial Impression / Assessment and Plan / UC Course  I have reviewed the triage vital signs and the nursing notes.  Pertinent labs & imaging results that were available during my care of the patient were reviewed by me and considered in my medical decision making (see chart for details).     Abdominal exam nondistended with mild lower abdominal tenderness worse on the right, otherwise vital signs and rest of exam fairly reassuring.  Her pain is much less this morning than it was last night.  Possibly viral but given her history of diverticulitis, will obtain blood work to check in particular for her white blood cell count.  If symptoms do not improve or worsen, may need to initiate antibiotics to cover for diverticulitis.  We will give Anusol for her suspected inflamed internal hemorrhoids, likely flared from the diarrhea episodes.  Vaginal swab pending for her vaginal odor.  We will treat based on results.  ED for acutely worsening symptoms.  Final Clinical Impressions(s) / UC Diagnoses   Final diagnoses:   Lower abdominal pain  Diarrhea, unspecified type  Non-intractable vomiting with nausea, unspecified vomiting type  Vaginal odor  Inflamed internal hemorrhoid  Rectal bleeding     Discharge Instructions      Try probiotics, fiber supplements, bland diet, lots of fluids.  Follow-up with your primary care provider for a recheck and return for evaluation immediately if symptoms worsening anytime.  We will be in touch with your lab results should there be abnormal findings     ED Prescriptions     Medication Sig Dispense Auth. Provider   hydrocortisone (ANUSOL-HC) 25 MG suppository Place 1 suppository (25 mg total) rectally 2 (two) times daily. 12 suppository Volney American, [place]      PDMP not reviewed this encounter.   Volney American, [place] 12/09/20 1955

## 2020-12-10 LAB — CERVICOVAGINAL ANCILLARY ONLY
Bacterial Vaginitis (gardnerella): NEGATIVE
Candida Glabrata: NEGATIVE
Candida Vaginitis: NEGATIVE
Comment: NEGATIVE
Comment: NEGATIVE
Comment: NEGATIVE

## 2020-12-17 NOTE — Progress Notes (Signed)
Subjective:    Patient ID: Terri Alexander, female    DOB: Aug 18, 1950, 70 y.o.   MRN: [id number]  HPI: Terri Alexander is a 70 y.o. female presenting for urgent care follow up.  Chief Complaint  Patient presents with   Illness    Onset  Monday, told diverticulitis flare. Took cipro 7 day tx   URGENT CARE FOLLOW UP Time since discharge: ~9 days  Hospital/facility: [facility]  Diagnosis: presumed diverticulitis  Procedures/tests: CBC  Consultants: none  New medications: flagyl & cipro x 7 days  Discharge instructions:  complete antibiotics and follow up with PCP  Status: better then worse  ABDOMINAL ISSUES Also has a history of vertigo. Duration: week Nature: bubbles, cramping Location: lower center abdomen Severity: moderate Radiation: no Episode duration: comes and goes Frequency:  Alleviating factors:  Aggravating factors:milkshake Treatments attempted: cipro and Flagyl Constipation: no Diarrhea: yes Episodes of diarrhea/day: 2-3 Mucous in the stool: no Heartburn: yes - burping, not new Bloating:yes Flatulence: yes Nausea: yes; started this morning Vomiting: no Episodes of vomit/day: 1 time, mostly spit Melena or hematochezia: no Rash: no Jaundice: no Pale:  yes Fever: no Weight loss: yes  Has not been drinking very much fluids.  Has eaten jello, yogurt, water, apple juice.   Has Nissan fundiplication years ago - wonders if this needs to be re-addressed.   No Known Allergies  Outpatient Encounter Medications as of 12/18/2020  Medication Sig   alendronate (FOSAMAX) 70 MG tablet Take 1 tablet (70 mg total) by mouth every 7 (seven) days. Take with a full glass of water on an empty stomach.   aspirin 81 MG tablet Take 81 mg by mouth daily.   Biotin 1000 MCG CHEW Chew by mouth.   bisacodyl (BISACODYL) 5 MG EC tablet Take 5 mg by mouth daily as needed for moderate constipation. Dulcolax 5 mg tab take as directed for colonoscopy prep.   Calcium  Carb-Cholecalciferol (CALCIUM 600 + D PO) Take 600 mg by mouth once.   chlorpheniramine (CHLOR-TRIMETON) 4 MG tablet Take 4 mg by mouth 2 (two) times daily as needed for allergies.   Cholecalciferol (VITAMIN D) 50 MCG (2000 UT) CAPS Take by mouth.   ciprofloxacin (CIPRO) 500 MG tablet Take 1 tablet (500 mg total) by mouth 2 (two) times daily.   co-enzyme Q-10 30 MG capsule Take 30 mg by mouth 3 (three) times daily.   Cranberry-Cholecalciferol 4200-500 MG-UNIT CAPS Take by mouth.   Cyanocobalamin (VITAMIN B 12 PO) Take 1,000 mcg by mouth daily.   docusate sodium (COLACE) 250 MG capsule Take 250 mg by mouth daily.   hydrocortisone (ANUSOL-HC) 25 MG suppository Place 1 suppository (25 mg total) rectally 2 (two) times daily.   LYSINE PO Take by mouth.   Methylcellulose, Laxative, (FIBER THERAPY PO) Take by mouth.   metroNIDAZOLE (FLAGYL) 500 MG tablet Take 1 tablet (500 mg total) by mouth 3 (three) times daily.   Multiple Vitamin (MULTIVITAMIN) tablet Take 1 tablet by mouth daily.   Probiotic Product (PROBIOTIC PO) Take by mouth.   simvastatin (ZOCOR) 10 MG tablet Take 1 tablet (10 mg total) by mouth at bedtime.   triamcinolone cream (KENALOG) 0.1 % Apply 1 application topically 2 (two) times daily.   vitamin C (ASCORBIC ACID) 500 MG tablet Take 500 mg by mouth daily.   No facility-administered encounter medications on file as of 12/18/2020.    Patient Active Problem List   Diagnosis Date Noted   Full body hives 05/05/2020  Dyslipidemia 03/24/2018   Postmenopausal estrogen deficiency 03/24/2018   Squamous cell carcinoma 08/10/2017   HSV-1 (herpes simplex virus 1) infection 03/16/2017   Osteoporosis 04/15/2016   Vulvar atrophy 11/07/2014   Abnormal Pap smear of cervix 11/07/2014    Past Medical History:  Diagnosis Date   Adenomatous colon polyp    Allergy    Anemia    as a child, no current problem   Blood transfusion without reported diagnosis    36 months old   Cataract     bilateral - MD just watching    GERD (gastroesophageal reflux disease)    Hyperlipidemia    Osteoporosis 04/15/2016   Skin cancer 2013   Skin cancer- Nose, face    Relevant past medical, surgical, family and social history reviewed and updated as indicated. Interim medical history since our last visit reviewed.  Review of Systems Per HPI unless specifically indicated above     Objective:    BP 122/86   Pulse 96   Temp 98.8 F (37.1 C)   Ht '5\' 1"'$  (1.549 m)   Wt 124 lb 6.4 oz (56.4 kg)   LMP  (LMP Unknown)   SpO2 96%   BMI 23.51 kg/m   Wt Readings from Last 3 Encounters:  12/18/20 124 lb 6.4 oz (56.4 kg)  06/11/20 130 lb (59 kg)  05/05/20 131 lb (59.4 kg)    Physical Exam Constitutional:      General: She is not in acute distress.    Appearance: Normal appearance. She is ill-appearing. She is not toxic-appearing or diaphoretic.  HENT:     Head: Normocephalic and atraumatic.  Eyes:     General: No scleral icterus.    Extraocular Movements: Extraocular movements intact.  Cardiovascular:     Rate and Rhythm: Normal rate and regular rhythm.  Abdominal:     General: Abdomen is flat. Bowel sounds are normal.     Palpations: Abdomen is soft.     Tenderness: There is abdominal tenderness in the left lower quadrant. There is no right CVA tenderness, left CVA tenderness, guarding or rebound.    Musculoskeletal:     Right lower leg: No edema.     Left lower leg: No edema.  Skin:    General: Skin is warm and dry.     Capillary Refill: Capillary refill takes less than 2 seconds.     Coloration: Skin is pale. Skin is not jaundiced.     Findings: No erythema.  Neurological:     Mental Status: She is alert and oriented to person, place, and time.     Motor: No weakness.     Gait: Gait normal.  Psychiatric:        Mood and Affect: Mood normal.        Behavior: Behavior normal.        Thought Content: Thought content normal.        Judgment: Judgment normal.       Assessment & Plan:  1. LLQ abdominal pain Will check STAT CMET for electrolytes and kidney function and blood counts to check infection.  Will also check STAT CT abdomen/pelvis to check for abdominal process.  On examination today, she is not tachycardic and abdomen is soft.  Suspect ongoing diverticulitis vs. Colitis.  Start liquid diet and plan to follow up Monday.  If symptoms worsen in the meantime, seek emergent care.  - COMPLETE METABOLIC PANEL WITH GFR - CBC with Differential/Platelet - CT Abdomen Pelvis W Contrast; Future  Follow up plan: Return in about 5 days (around 12/23/2020) for pending lab work.

## 2020-12-18 ENCOUNTER — Encounter: Payer: Self-pay | Admitting: Nurse Practitioner

## 2020-12-18 ENCOUNTER — Other Ambulatory Visit: Payer: Self-pay

## 2020-12-18 ENCOUNTER — Ambulatory Visit (HOSPITAL_BASED_OUTPATIENT_CLINIC_OR_DEPARTMENT_OTHER)
Admission: RE | Admit: 2020-12-18 | Discharge: 2020-12-18 | Disposition: A | Discharge status: Home / Self Care | Payer: Medicare Other | Source: Ambulatory Visit | Attending: Nurse Practitioner | Admitting: Nurse Practitioner

## 2020-12-18 ENCOUNTER — Encounter (HOSPITAL_BASED_OUTPATIENT_CLINIC_OR_DEPARTMENT_OTHER): Payer: Self-pay

## 2020-12-18 ENCOUNTER — Telehealth: Payer: Self-pay | Admitting: Nurse Practitioner

## 2020-12-18 ENCOUNTER — Ambulatory Visit (INDEPENDENT_AMBULATORY_CARE_PROVIDER_SITE_OTHER): Payer: Medicare Other | Admitting: Nurse Practitioner

## 2020-12-18 VITALS — BP 122/86 | HR 96 | Temp 98.8°F | Ht 61.0 in | Wt 124.4 lb

## 2020-12-18 DIAGNOSIS — R197 Diarrhea, unspecified: Secondary | ICD-10-CM

## 2020-12-18 DIAGNOSIS — R1032 Left lower quadrant pain: Secondary | ICD-10-CM

## 2020-12-18 DIAGNOSIS — R7309 Other abnormal glucose: Secondary | ICD-10-CM | POA: Diagnosis not present

## 2020-12-18 DIAGNOSIS — R109 Unspecified abdominal pain: Secondary | ICD-10-CM | POA: Diagnosis not present

## 2020-12-18 MED ORDER — IOHEXOL 300 MG/ML  SOLN
75.0000 mL | Freq: Once | INTRAMUSCULAR | Status: AC | PRN
  Administered 2020-12-18: 75 mL via INTRAVENOUS

## 2020-12-18 MED ORDER — AMOXICILLIN-POT CLAVULANATE 875-125 MG PO TABS: 1.0000 | ORAL_TABLET | Freq: Three times a day (TID) | ORAL | 0 refills | Status: DC

## 2020-12-18 NOTE — Telephone Encounter (Signed)
Called patient to discuss stat results from today.  WBC counts are elevated, suggesting acute infection.  Additionally, glucose is greater than 200.  I have added on an hemoglobin A1c to screen for diabetes.  The patient also may be slightly dehydrated based on kidney function.  CT of abdomen pelvis does not suggest a reason for her left lower quadrant abdominal pain, however does show diverticulosis.  The patient reports she is feeling still weak, but slightly better.  She has been able to tolerate some Sprite 0 today.  She plans on hydrating well this evening and through the morning.  We will check stool cultures and C. difficile to rule out infectious diarrhea.  Plan to empirically treat with Augmentin given continued left lower quadrant pain and chills.  Plan to follow-up as scheduled next week.

## 2020-12-19 ENCOUNTER — Other Ambulatory Visit: Payer: Medicare Other

## 2020-12-19 DIAGNOSIS — R197 Diarrhea, unspecified: Secondary | ICD-10-CM | POA: Diagnosis not present

## 2020-12-20 LAB — COMPLETE METABOLIC PANEL WITH GFR
AG Ratio: 1.8 (calc) (ref 1.0–2.5)
ALT: 17 U/L (ref 6–29)
AST: 24 U/L (ref 10–35)
Albumin: 4.2 g/dL (ref 3.6–5.1)
Alkaline phosphatase (APISO): 64 U/L (ref 37–153)
BUN/Creatinine Ratio: 27 (calc) — ABNORMAL HIGH (ref 6–22)
BUN: 26 mg/dL — ABNORMAL HIGH (ref 7–25)
CO2: 25 mmol/L (ref 20–32)
Calcium: 8.9 mg/dL (ref 8.6–10.4)
Chloride: 105 mmol/L (ref 98–110)
Creat: 0.95 mg/dL (ref 0.60–1.00)
Globulin: 2.4 g/dL (calc) (ref 1.9–3.7)
Glucose, Bld: 241 mg/dL — ABNORMAL HIGH (ref 65–99)
Potassium: 4.9 mmol/L (ref 3.5–5.3)
Sodium: 141 mmol/L (ref 135–146)
Total Bilirubin: 0.6 mg/dL (ref 0.2–1.2)
Total Protein: 6.6 g/dL (ref 6.1–8.1)
eGFR: 64 mL/min/{1.73_m2} (ref >=60)

## 2020-12-20 LAB — C. DIFFICILE GDH AND TOXIN A/B
GDH ANTIGEN: NOT DETECTED
MICRO NUMBER:: 12207588
SPECIMEN QUALITY:: ADEQUATE
TOXIN A AND B: NOT DETECTED

## 2020-12-20 LAB — CBC WITH DIFFERENTIAL/PLATELET
Absolute Monocytes: 1442 cells/uL — ABNORMAL HIGH (ref 200–950)
Basophils Absolute: 62 cells/uL (ref 0–200)
Basophils Relative: 0.4 %
Eosinophils Absolute: 202 cells/uL (ref 15–500)
Eosinophils Relative: 1.3 %
HCT: 44.2 % (ref 35.0–45.0)
Hemoglobin: 14.7 g/dL (ref 11.7–15.5)
Lymphs Abs: 1705 cells/uL (ref 850–3900)
MCH: 30.6 pg (ref 27.0–33.0)
MCHC: 33.3 g/dL (ref 32.0–36.0)
MCV: 92.1 fL (ref 80.0–100.0)
MPV: 10.2 fL (ref 7.5–12.5)
Monocytes Relative: 9.3 %
Neutro Abs: 12090 cells/uL — ABNORMAL HIGH (ref 1500–7800)
Neutrophils Relative %: 78 %
Platelets: 353 10*3/uL (ref 140–400)
RBC: 4.8 10*6/uL (ref 3.80–5.10)
RDW: 12.9 % (ref 11.0–15.0)
Total Lymphocyte: 11 %
WBC: 15.5 10*3/uL — ABNORMAL HIGH (ref 3.8–10.8)

## 2020-12-20 LAB — TEST AUTHORIZATION

## 2020-12-20 LAB — HEMOGLOBIN A1C W/OUT EAG: Hgb A1c MFr Bld: 5.6 % of total Hgb (ref <5.7)

## 2020-12-23 LAB — SALMONELLA/SHIGELLA CULT, CAMPY EIA AND SHIGA TOXIN RFL ECOLI
MICRO NUMBER: 12207289
MICRO NUMBER:: 12207290
MICRO NUMBER:: 12207291
Result:: NOT DETECTED
SHIGA RESULT:: NOT DETECTED
SPECIMEN QUALITY: ADEQUATE
SPECIMEN QUALITY:: ADEQUATE
SPECIMEN QUALITY:: ADEQUATE

## 2020-12-25 ENCOUNTER — Encounter: Payer: Self-pay | Admitting: Family Medicine

## 2020-12-25 ENCOUNTER — Other Ambulatory Visit: Payer: Self-pay

## 2020-12-25 ENCOUNTER — Ambulatory Visit (INDEPENDENT_AMBULATORY_CARE_PROVIDER_SITE_OTHER): Payer: Medicare Other | Admitting: Family Medicine

## 2020-12-25 VITALS — BP 124/72 | HR 76 | Temp 98.0°F | Resp 4 | Ht 61.0 in | Wt 126.0 lb

## 2020-12-25 DIAGNOSIS — K5720 Diverticulitis of large intestine with perforation and abscess without bleeding: Secondary | ICD-10-CM

## 2020-12-25 LAB — BASIC METABOLIC PANEL WITH GFR
BUN: 23 mg/dL (ref 7–25)
CO2: 27 mmol/L (ref 20–32)
Calcium: 9.3 mg/dL (ref 8.6–10.4)
Chloride: 103 mmol/L (ref 98–110)
Creat: 0.63 mg/dL (ref 0.60–1.00)
Glucose, Bld: 83 mg/dL (ref 65–99)
Potassium: 4.8 mmol/L (ref 3.5–5.3)
Sodium: 141 mmol/L (ref 135–146)
eGFR: 95 mL/min/{1.73_m2} (ref >=60)

## 2020-12-25 LAB — CBC WITH DIFFERENTIAL/PLATELET
Absolute Monocytes: 1099 cells/uL — ABNORMAL HIGH (ref 200–950)
Basophils Absolute: 59 cells/uL (ref 0–200)
Basophils Relative: 0.6 %
Eosinophils Absolute: 0 cells/uL — ABNORMAL LOW (ref 15–500)
Eosinophils Relative: 0 %
HCT: 38.8 % (ref 35.0–45.0)
Hemoglobin: 12.5 g/dL (ref 11.7–15.5)
Lymphs Abs: 2495 cells/uL (ref 850–3900)
MCH: 30 pg (ref 27.0–33.0)
MCHC: 32.2 g/dL (ref 32.0–36.0)
MCV: 93.3 fL (ref 80.0–100.0)
MPV: 10.4 fL (ref 7.5–12.5)
Monocytes Relative: 11.1 %
Neutro Abs: 6247 cells/uL (ref 1500–7800)
Neutrophils Relative %: 63.1 %
Platelets: 410 10*3/uL — ABNORMAL HIGH (ref 140–400)
RBC: 4.16 10*6/uL (ref 3.80–5.10)
RDW: 12.9 % (ref 11.0–15.0)
Total Lymphocyte: 25.2 %
WBC: 9.9 10*3/uL (ref 3.8–10.8)

## 2020-12-25 NOTE — Progress Notes (Signed)
Subjective:    Patient ID: Terri Alexander, female    DOB: 06/14/1950, 70 y.o.   MRN: [id number]  HPI Recently, the patient was diagnosed with diverticulitis.  She had severe lower abdominal pain.  She was having crampy intestinal spasms and diarrhea.  She was also having vasovagal like symptoms of sweating and lightheadedness when on the toilet.  She went to an urgent care and was placed on Cipro and Flagyl.  Her white blood cell count was elevated.  Symptoms did not improve.  Upon return, my partner ordered a CT scan of the abdomen pelvis approximately 7 days after the patient is started Cipro and Flagyl.  Despite the patient still having severe crampy abdominal pain and diarrhea, the CT scan showed no evidence of diverticulitis or obstruction.  My partner switched her to Augmentin under the theory that perhaps she had partially treated diverticulitis.  Thankfully the patient's symptoms have improved.  She states that she has not had diarrhea in the last 2 days.  The pain is much better.  She did have an elevated white blood cell count at the last visit so we need to follow that up.  She also had an elevated random sugar but I believe this was a stress reaction as her hemoglobin A1c was 5.5 Past Medical History:  Diagnosis Date   Adenomatous colon polyp    Allergy    Anemia    as a child, no current problem   Blood transfusion without reported diagnosis    88 months old   Cataract    bilateral - MD just watching    GERD (gastroesophageal reflux disease)    Hyperlipidemia    Osteoporosis 04/15/2016   Skin cancer 2013   Skin cancer- Nose, face   Current Outpatient Medications on File Prior to Visit  Medication Sig Dispense Refill   alendronate (FOSAMAX) 70 MG tablet Take 1 tablet (70 mg total) by mouth every 7 (seven) days. Take with a full glass of water on an empty stomach. 12 tablet 3   aspirin 81 MG tablet Take 81 mg by mouth daily.     Biotin 1000 MCG CHEW Chew by mouth.     bisacodyl  (BISACODYL) 5 MG EC tablet Take 5 mg by mouth daily as needed for moderate constipation. Dulcolax 5 mg tab take as directed for colonoscopy prep.     Calcium Carb-Cholecalciferol (CALCIUM 600 + D PO) Take 600 mg by mouth once.     chlorpheniramine (CHLOR-TRIMETON) 4 MG tablet Take 4 mg by mouth 2 (two) times daily as needed for allergies.     Cholecalciferol (VITAMIN D) 50 MCG (2000 UT) CAPS Take by mouth.     co-enzyme Q-10 30 MG capsule Take 30 mg by mouth 3 (three) times daily.     Cranberry-Cholecalciferol 4200-500 MG-UNIT CAPS Take by mouth.     Cyanocobalamin (VITAMIN B 12 PO) Take 1,000 mcg by mouth daily.     docusate sodium (COLACE) 250 MG capsule Take 250 mg by mouth daily.     hydrocortisone (ANUSOL-HC) 25 MG suppository Place 1 suppository (25 mg total) rectally 2 (two) times daily. 12 suppository 0   LYSINE PO Take by mouth.     Methylcellulose, Laxative, (FIBER THERAPY PO) Take by mouth.     Multiple Vitamin (MULTIVITAMIN) tablet Take 1 tablet by mouth daily.     Probiotic Product (PROBIOTIC PO) Take by mouth.     simvastatin (ZOCOR) 10 MG tablet Take 1 tablet (10 mg total)  by mouth at bedtime. 90 tablet 3   triamcinolone cream (KENALOG) 0.1 % Apply 1 application topically 2 (two) times daily.     vitamin C (ASCORBIC ACID) 500 MG tablet Take 500 mg by mouth daily.     No current facility-administered medications on file prior to visit.   No Known Allergies Social History   Socioeconomic History   Marital status: Married    Spouse name: Not on file   Number of children: Not on file   Years of education: Not on file   Highest education level: Not on file  Occupational History   Not on file  Tobacco Use   Smoking status: Never   Smokeless tobacco: Never  Vaping Use   Vaping Use: Never used  Substance and Sexual Activity   Alcohol use: Yes    Alcohol/week: 1.0 standard drink    Types: 1 Glasses of wine per week   Drug use: No   Sexual activity: Not Currently     Birth control/protection: Post-menopausal  Other Topics Concern   Not on file  Social History Narrative   Not on file   Social Determinants of Health   Financial Resource Strain: Not on file  Food Insecurity: Not on file  Transportation Needs: Not on file  Physical Activity: Not on file  Stress: Not on file  Social Connections: Not on file  Intimate Partner Violence: Not on file      Review of Systems  All other systems reviewed and are negative.     Objective:   Physical Exam Vitals reviewed.  Constitutional:      General: She is not in acute distress.    Appearance: Normal appearance. She is not ill-appearing, toxic-appearing or diaphoretic.  Cardiovascular:     Rate and Rhythm: Normal rate and regular rhythm.     Pulses: Normal pulses.     Heart sounds: Normal heart sounds. No murmur heard.   No friction rub. No gallop.  Pulmonary:     Effort: Pulmonary effort is normal. No respiratory distress.     Breath sounds: Normal breath sounds. No stridor. No wheezing, rhonchi or rales.  Chest:     Chest wall: No tenderness.  Abdominal:     General: Abdomen is flat. Bowel sounds are normal. There is no distension.     Palpations: Abdomen is soft.     Tenderness: There is no abdominal tenderness. There is no guarding or rebound.  Neurological:     Mental Status: She is alert.          Assessment & Plan:  Diverticulitis of large intestine with perforation without abscess or bleeding - Plan: CBC with Differential/Platelet, BASIC METABOLIC PANEL WITH GFR Differential diagnosis includes diverticulitis that was partially treated at the time of the CT scan causing no abnormality seen on CT scan, adhesions due to her previous abdominal surgery causing intestinal spasms, colitis due to ischemia or inflammatory bowel disease, or less likely irritable bowel syndrome.  Repeat CBC to ensure resolution of leukocytosis.  If the patient continues to have severe crampy abdominal pain, I  would recommend GI consultation and colonoscopy to rule out the other potential causes on the differential diagnosis.

## 2021-01-20 ENCOUNTER — Encounter: Payer: Self-pay | Admitting: Family Medicine

## 2021-01-26 ENCOUNTER — Encounter: Payer: Self-pay | Admitting: Family Medicine

## 2021-01-26 ENCOUNTER — Other Ambulatory Visit: Payer: Self-pay | Admitting: Family Medicine

## 2021-01-26 MED ORDER — AMOXICILLIN-POT CLAVULANATE 875-125 MG PO TABS: 1.0000 | ORAL_TABLET | Freq: Two times a day (BID) | ORAL | 0 refills | Status: DC

## 2021-01-26 NOTE — Telephone Encounter (Signed)
Patient responded and states that she did have chills and loose stools this morning.

## 2021-02-26 DIAGNOSIS — R208 Other disturbances of skin sensation: Secondary | ICD-10-CM | POA: Diagnosis not present

## 2021-02-26 DIAGNOSIS — L298 Other pruritus: Secondary | ICD-10-CM | POA: Diagnosis not present

## 2021-02-26 DIAGNOSIS — R58 Hemorrhage, not elsewhere classified: Secondary | ICD-10-CM | POA: Diagnosis not present

## 2021-02-26 DIAGNOSIS — Z85828 Personal history of other malignant neoplasm of skin: Secondary | ICD-10-CM | POA: Diagnosis not present

## 2021-02-26 DIAGNOSIS — D492 Neoplasm of unspecified behavior of bone, soft tissue, and skin: Secondary | ICD-10-CM | POA: Diagnosis not present

## 2021-02-26 DIAGNOSIS — Z08 Encounter for follow-up examination after completed treatment for malignant neoplasm: Secondary | ICD-10-CM | POA: Diagnosis not present

## 2021-02-26 DIAGNOSIS — C4442 Squamous cell carcinoma of skin of scalp and neck: Secondary | ICD-10-CM | POA: Diagnosis not present

## 2021-04-14 DIAGNOSIS — C4442 Squamous cell carcinoma of skin of scalp and neck: Secondary | ICD-10-CM | POA: Diagnosis not present

## 2021-05-26 ENCOUNTER — Other Ambulatory Visit: Payer: Self-pay

## 2021-05-26 DIAGNOSIS — Z0000 Encounter for general adult medical examination without abnormal findings: Secondary | ICD-10-CM

## 2021-05-26 DIAGNOSIS — E785 Hyperlipidemia, unspecified: Secondary | ICD-10-CM

## 2021-05-27 ENCOUNTER — Other Ambulatory Visit: Payer: Medicare Other

## 2021-05-27 ENCOUNTER — Other Ambulatory Visit: Payer: Self-pay

## 2021-05-27 DIAGNOSIS — Z0000 Encounter for general adult medical examination without abnormal findings: Secondary | ICD-10-CM

## 2021-05-27 DIAGNOSIS — E785 Hyperlipidemia, unspecified: Secondary | ICD-10-CM | POA: Diagnosis not present

## 2021-05-27 LAB — CBC WITH DIFFERENTIAL/PLATELET
Absolute Monocytes: 886 cells/uL (ref 200–950)
Basophils Absolute: 62 cells/uL (ref 0–200)
Basophils Relative: 0.8 %
Eosinophils Absolute: 893 cells/uL — ABNORMAL HIGH (ref 15–500)
Eosinophils Relative: 11.6 %
HCT: 41.6 % (ref 35.0–45.0)
Hemoglobin: 13.6 g/dL (ref 11.7–15.5)
Lymphs Abs: 2071 cells/uL (ref 850–3900)
MCH: 30 pg (ref 27.0–33.0)
MCHC: 32.7 g/dL (ref 32.0–36.0)
MCV: 91.8 fL (ref 80.0–100.0)
MPV: 11 fL (ref 7.5–12.5)
Monocytes Relative: 11.5 %
Neutro Abs: 3788 cells/uL (ref 1500–7800)
Neutrophils Relative %: 49.2 %
Platelets: 318 10*3/uL (ref 140–400)
RBC: 4.53 10*6/uL (ref 3.80–5.10)
RDW: 13.1 % (ref 11.0–15.0)
Total Lymphocyte: 26.9 %
WBC: 7.7 10*3/uL (ref 3.8–10.8)

## 2021-05-27 LAB — COMPREHENSIVE METABOLIC PANEL
AG Ratio: 1.8 (calc) (ref 1.0–2.5)
ALT: 16 U/L (ref 6–29)
AST: 20 U/L (ref 10–35)
Albumin: 4.2 g/dL (ref 3.6–5.1)
Alkaline phosphatase (APISO): 64 U/L (ref 37–153)
BUN: 20 mg/dL (ref 7–25)
CO2: 31 mmol/L (ref 20–32)
Calcium: 9.3 mg/dL (ref 8.6–10.4)
Chloride: 107 mmol/L (ref 98–110)
Creat: 0.66 mg/dL (ref 0.60–1.00)
Globulin: 2.3 g/dL (calc) (ref 1.9–3.7)
Glucose, Bld: 101 mg/dL — ABNORMAL HIGH (ref 65–99)
Potassium: 4.9 mmol/L (ref 3.5–5.3)
Sodium: 142 mmol/L (ref 135–146)
Total Bilirubin: 0.6 mg/dL (ref 0.2–1.2)
Total Protein: 6.5 g/dL (ref 6.1–8.1)

## 2021-05-27 LAB — LIPID PANEL
Cholesterol: 162 mg/dL (ref <200)
HDL: 72 mg/dL (ref >=50)
LDL Cholesterol (Calc): 72 mg/dL (calc)
Non-HDL Cholesterol (Calc): 90 mg/dL (calc) (ref <130)
Total CHOL/HDL Ratio: 2.3 (calc) (ref <5.0)
Triglycerides: 92 mg/dL (ref <150)

## 2021-05-28 ENCOUNTER — Other Ambulatory Visit: Payer: Medicare Other

## 2021-06-02 ENCOUNTER — Encounter: Payer: Medicare Other | Admitting: Family Medicine

## 2021-06-05 ENCOUNTER — Other Ambulatory Visit: Payer: Self-pay | Admitting: Family Medicine

## 2021-06-05 DIAGNOSIS — Z1231 Encounter for screening mammogram for malignant neoplasm of breast: Secondary | ICD-10-CM

## 2021-06-09 ENCOUNTER — Other Ambulatory Visit: Payer: Self-pay

## 2021-06-09 ENCOUNTER — Encounter: Payer: Self-pay | Admitting: Family Medicine

## 2021-06-09 ENCOUNTER — Ambulatory Visit (INDEPENDENT_AMBULATORY_CARE_PROVIDER_SITE_OTHER): Payer: Medicare Other | Admitting: Family Medicine

## 2021-06-09 VITALS — BP 132/68 | HR 82 | Temp 97.2°F | Resp 18 | Ht 61.0 in | Wt 130.0 lb

## 2021-06-09 DIAGNOSIS — R002 Palpitations: Secondary | ICD-10-CM

## 2021-06-09 DIAGNOSIS — Z0000 Encounter for general adult medical examination without abnormal findings: Secondary | ICD-10-CM

## 2021-06-09 NOTE — Progress Notes (Signed)
Subjective:    Patient ID: Terri Alexander, female    DOB: 08/05/1950, 71 y.o.   MRN: [id number]  HPI Patient is here today for complete physical exam however she reports palpitations occurring frequently.  She states that at times she will feel her heart starts to race and beat rapidly.  At other times she will feel palpitations in her chest.  Today on examination she is in normal sinus rhythm.  However she does have an occasional PVC auscultated on exam.  This occurs irregularly and sporadically.  However the patient is unable to feel these today.  She denies any syncope, chest pain, shortness of breath, dyspnea on exertion.  Her last colonoscopy was in 2021 and was normal.  They gave her 10-year clearance.  Due to age she does not require Pap smear.  Her mammogram is already been scheduled.  Her immunizations are up-to-date: Immunization History  Administered Date(s) Administered   Fluad Quad(high Dose 65+) 02/14/2019, 01/17/2020   Influenza, High Dose Seasonal PF 02/23/2016, 01/05/2017   Influenza,inj,Quad PF,6+ Mos 03/10/2015, 02/08/2018   Influenza-Unspecified 02/08/2018, 01/16/2021   PFIZER(Purple Top)SARS-COV-2 Vaccination 09/12/2019, 10/02/2019, 04/04/2020   Pneumococcal Conjugate-13 03/15/2016   Pneumococcal Polysaccharide-23 03/16/2017   Tdap 03/10/2015   Zoster Recombinat (Shingrix) 05/29/2018, 09/26/2018   Zoster, Live 04/23/2013   She denies any falls or depression or memory loss. Past Medical History:  Diagnosis Date   Adenomatous colon polyp    Allergy    Anemia    as a child, no current problem   Blood transfusion without reported diagnosis    9 months old   Cataract    bilateral - MD just watching    GERD (gastroesophageal reflux disease)    Hyperlipidemia    Osteoporosis 04/15/2016   Skin cancer 2013   Skin cancer- Nose, face   Current Outpatient Medications on File Prior to Visit  Medication Sig Dispense Refill   alendronate (FOSAMAX) 70 MG tablet Take 1  tablet (70 mg total) by mouth every 7 (seven) days. Take with a full glass of water on an empty stomach. 12 tablet 3   aspirin 81 MG tablet Take 81 mg by mouth daily.     Biotin 1000 MCG CHEW Chew by mouth.     bisacodyl (BISACODYL) 5 MG EC tablet Take 5 mg by mouth daily as needed for moderate constipation. Dulcolax 5 mg tab take as directed for colonoscopy prep.     Calcium Carb-Cholecalciferol (CALCIUM 600 + D PO) Take 600 mg by mouth once.     chlorpheniramine (CHLOR-TRIMETON) 4 MG tablet Take 4 mg by mouth 2 (two) times daily as needed for allergies.     Cholecalciferol (VITAMIN D) 50 MCG (2000 UT) CAPS Take by mouth.     co-enzyme Q-10 30 MG capsule Take 30 mg by mouth 3 (three) times daily.     Cranberry-Cholecalciferol 4200-500 MG-UNIT CAPS Take by mouth.     docusate sodium (COLACE) 250 MG capsule Take 250 mg by mouth daily.     LYSINE PO Take by mouth.     Methylcellulose, Laxative, (FIBER THERAPY PO) Take by mouth.     Multiple Vitamin (MULTIVITAMIN) tablet Take 1 tablet by mouth daily.     Probiotic Product (PROBIOTIC PO) Take by mouth.     simvastatin (ZOCOR) 10 MG tablet Take 1 tablet (10 mg total) by mouth at bedtime. 90 tablet 3   triamcinolone cream (KENALOG) 0.1 % Apply 1 application topically 2 (two) times daily.  vitamin C (ASCORBIC ACID) 500 MG tablet Take 500 mg by mouth daily.     No current facility-administered medications on file prior to visit.   No Known Allergies Social History   Socioeconomic History   Marital status: Married    Spouse name: Not on file   Number of children: Not on file   Years of education: Not on file   Highest education level: Not on file  Occupational History   Not on file  Tobacco Use   Smoking status: Never   Smokeless tobacco: Never  Vaping Use   Vaping Use: Never used  Substance and Sexual Activity   Alcohol use: Yes    Alcohol/week: 1.0 standard drink    Types: 1 Glasses of wine per week   Drug use: No   Sexual  activity: Not Currently    Birth control/protection: Post-menopausal  Other Topics Concern   Not on file  Social History Narrative   Not on file   Social Determinants of Health   Financial Resource Strain: Not on file  Food Insecurity: Not on file  Transportation Needs: Not on file  Physical Activity: Not on file  Stress: Not on file  Social Connections: Not on file  Intimate Partner Violence: Not on file      Review of Systems  All other systems reviewed and are negative.     Objective:   Physical Exam Vitals reviewed.  Constitutional:      General: She is not in acute distress.    Appearance: Normal appearance. She is not ill-appearing, toxic-appearing or diaphoretic.  HENT:     Head: Normocephalic and atraumatic.     Right Ear: Tympanic membrane and ear canal normal.     Left Ear: Tympanic membrane and ear canal normal.     Nose: Nose normal.     Mouth/Throat:     Mouth: Mucous membranes are moist.     Pharynx: Oropharynx is clear. No oropharyngeal exudate or posterior oropharyngeal erythema.  Eyes:     Extraocular Movements: Extraocular movements intact.     Conjunctiva/sclera: Conjunctivae normal.     Pupils: Pupils are equal, round, and reactive to light.  Neck:     Vascular: No carotid bruit.  Cardiovascular:     Rate and Rhythm: Normal rate and regular rhythm.     Pulses: Normal pulses.     Heart sounds: Normal heart sounds. No murmur heard.   No friction rub. No gallop.  Pulmonary:     Effort: Pulmonary effort is normal. No respiratory distress.     Breath sounds: Normal breath sounds. No stridor. No wheezing, rhonchi or rales.  Chest:     Chest wall: No tenderness.  Abdominal:     General: Abdomen is flat. Bowel sounds are normal. There is no distension.     Palpations: Abdomen is soft. There is no mass.     Tenderness: There is no abdominal tenderness. There is no guarding or rebound.     Hernia: No hernia is present.  Musculoskeletal:      Cervical back: Normal range of motion and neck supple. No tenderness.     Right lower leg: No edema.     Left lower leg: No edema.  Lymphadenopathy:     Cervical: No cervical adenopathy.  Skin:    General: Skin is warm.     Coloration: Skin is not jaundiced or pale.     Findings: No bruising, erythema, lesion or rash.  Neurological:  General: No focal deficit present.     Mental Status: She is alert and oriented to person, place, and time. Mental status is at baseline.     Cranial Nerves: No cranial nerve deficit.     Sensory: No sensory deficit.     Motor: No weakness.     Coordination: Coordination normal.     Gait: Gait normal.     Deep Tendon Reflexes: Reflexes normal.  Psychiatric:        Mood and Affect: Mood normal.        Behavior: Behavior normal.        Thought Content: Thought content normal.        Judgment: Judgment normal.          Assessment & Plan:  Routine general medical examination at a health care facility  Palpitations Patient's physical exam today reveals PVCs that are occurring sporadically.  This could be with the patient is feeling.  However given her age and a family history of atrial fibrillation, I do think it is reasonable to see cardiology for a Holter monitor to evaluate further.  Her most recent lab work is shown below: Appointment on 05/27/2021  Component Date Value Ref Range Status   WBC 05/27/2021 7.7  3.8 - 10.8 Thousand/uL Final   RBC 05/27/2021 4.53  3.80 - 5.10 Million/uL Final   Hemoglobin 05/27/2021 13.6  11.7 - 15.5 g/dL Final   HCT 05/27/2021 41.6  35.0 - 45.0 % Final   MCV 05/27/2021 91.8  80.0 - 100.0 fL Final   MCH 05/27/2021 30.0  27.0 - 33.0 pg Final   MCHC 05/27/2021 32.7  32.0 - 36.0 g/dL Final   RDW 05/27/2021 13.1  11.0 - 15.0 % Final   Platelets 05/27/2021 318  140 - 400 Thousand/uL Final   MPV 05/27/2021 11.0  7.5 - 12.5 fL Final   Neutro Abs 05/27/2021 3,788  1,500 - 7,800 cells/uL Final   Lymphs Abs 05/27/2021  2,071  850 - 3,900 cells/uL Final   Absolute Monocytes 05/27/2021 886  200 - 950 cells/uL Final   Eosinophils Absolute 05/27/2021 893 (H)  15 - 500 cells/uL Final   Basophils Absolute 05/27/2021 62  0 - 200 cells/uL Final   Neutrophils Relative % 05/27/2021 49.2  % Final   Total Lymphocyte 05/27/2021 26.9  % Final   Monocytes Relative 05/27/2021 11.5  % Final   Eosinophils Relative 05/27/2021 11.6  % Final   Basophils Relative 05/27/2021 0.8  % Final   Smear Review 05/27/2021    Final   Comment: Review of peripheral smear confirms automated results.    Glucose, Bld 05/27/2021 101 (H)  65 - 99 mg/dL Final   Comment: .            Fasting reference interval . For someone without known diabetes, a glucose value between 100 and 125 mg/dL is consistent with prediabetes and should be confirmed with a follow-up test. .    BUN 05/27/2021 20  7 - 25 mg/dL Final   Creat 05/27/2021 0.66  0.60 - 1.00 mg/dL Final   BUN/Creatinine Ratio 85/88/5027 NOT APPLICABLE  6 - 22 (calc) Final   Sodium 05/27/2021 142  135 - 146 mmol/L Final   Potassium 05/27/2021 4.9  3.5 - 5.3 mmol/L Final   Chloride 05/27/2021 107  98 - 110 mmol/L Final   CO2 05/27/2021 31  20 - 32 mmol/L Final   Calcium 05/27/2021 9.3  8.6 - 10.4 mg/dL Final   Total  Protein 05/27/2021 6.5  6.1 - 8.1 g/dL Final   Albumin 05/27/2021 4.2  3.6 - 5.1 g/dL Final   Globulin 05/27/2021 2.3  1.9 - 3.7 g/dL (calc) Final   AG Ratio 05/27/2021 1.8  1.0 - 2.5 (calc) Final   Total Bilirubin 05/27/2021 0.6  0.2 - 1.2 mg/dL Final   Alkaline phosphatase (APISO) 05/27/2021 64  37 - 153 U/L Final   AST 05/27/2021 20  10 - 35 U/L Final   ALT 05/27/2021 16  6 - 29 U/L Final   Cholesterol 05/27/2021 162  <200 mg/dL Final   HDL 05/27/2021 72  > OR = 50 mg/dL Final   Triglycerides 05/27/2021 92  <150 mg/dL Final   LDL Cholesterol (Calc) 05/27/2021 72  mg/dL (calc) Final   Comment: Reference range: <100 . Desirable range <100 mg/dL for primary  prevention;   <70 mg/dL for patients with CHD or diabetic patients  with > or = 2 CHD risk factors. Marland Kitchen LDL-C is now calculated using the Martin-Hopkins  calculation, which is a validated novel method providing  better accuracy than the Friedewald equation in the  estimation of LDL-C.  Cresenciano Genre et al. Annamaria Helling. 7001;749(44): 2061-2068  (http://education.[facility].com/faq/FAQ164)    Total CHOL/HDL Ratio 05/27/2021 2.3  <5.0 (calc) Final   Non-HDL Cholesterol (Calc) 05/27/2021 90  <130 mg/dL (calc) Final   Comment: For patients with diabetes plus 1 major ASCVD risk  factor, treating to a non-HDL-C goal of <100 mg/dL  (LDL-C of <70 mg/dL) is considered a therapeutic  option.    Labs are outstanding.  She is already scheduled for mammogram.  She does not require Pap smear and colonoscopy is up-to-date long-term immunizations.  Therefore her regular preventative care is up-to-date.  I recommended trying Flonase for eustachian tube dysfunction as her exam today is normal.  Await the results of the Holter monitor.  Consult cardiology for this

## 2021-06-24 ENCOUNTER — Other Ambulatory Visit: Payer: Self-pay

## 2021-06-24 ENCOUNTER — Ambulatory Visit: Payer: Medicare Other

## 2021-06-24 ENCOUNTER — Encounter: Payer: Self-pay | Admitting: Cardiology

## 2021-06-24 ENCOUNTER — Ambulatory Visit: Payer: Medicare Other | Admitting: Cardiology

## 2021-06-24 VITALS — BP 134/70 | HR 81 | Temp 98.0°F | Ht 62.0 in | Wt 129.0 lb

## 2021-06-24 DIAGNOSIS — D225 Melanocytic nevi of trunk: Secondary | ICD-10-CM | POA: Diagnosis not present

## 2021-06-24 DIAGNOSIS — R9431 Abnormal electrocardiogram [ECG] [EKG]: Secondary | ICD-10-CM | POA: Diagnosis not present

## 2021-06-24 DIAGNOSIS — I491 Atrial premature depolarization: Secondary | ICD-10-CM | POA: Diagnosis not present

## 2021-06-24 DIAGNOSIS — R002 Palpitations: Secondary | ICD-10-CM | POA: Diagnosis not present

## 2021-06-24 DIAGNOSIS — Z08 Encounter for follow-up examination after completed treatment for malignant neoplasm: Secondary | ICD-10-CM | POA: Diagnosis not present

## 2021-06-24 DIAGNOSIS — I493 Ventricular premature depolarization: Secondary | ICD-10-CM

## 2021-06-24 DIAGNOSIS — L812 Freckles: Secondary | ICD-10-CM | POA: Diagnosis not present

## 2021-06-24 DIAGNOSIS — L819 Disorder of pigmentation, unspecified: Secondary | ICD-10-CM | POA: Diagnosis not present

## 2021-06-24 DIAGNOSIS — L814 Other melanin hyperpigmentation: Secondary | ICD-10-CM | POA: Diagnosis not present

## 2021-06-24 DIAGNOSIS — L570 Actinic keratosis: Secondary | ICD-10-CM | POA: Diagnosis not present

## 2021-06-24 DIAGNOSIS — L821 Other seborrheic keratosis: Secondary | ICD-10-CM | POA: Diagnosis not present

## 2021-06-24 DIAGNOSIS — Z85828 Personal history of other malignant neoplasm of skin: Secondary | ICD-10-CM | POA: Diagnosis not present

## 2021-06-24 NOTE — Progress Notes (Signed)
Patient referred by Susy Frizzle, MD for palpitations  Subjective:   Terri Alexander, female    DOB: April 30, 1951, 71 y.o.   MRN: [id number]   Chief Complaint  Patient presents with   Palpitations   New Patient (Initial Visit)     HPI  71 y.o. Caucasian female with palpitations  Patient is here with her husband. She has been fairly healthy all her life. She has experienced symptoms of palpitations, with heart racing sensation. Symptoms last for about 30 min, happen no more than once a month. There is no associated chest pain or shortness of breath symptoms. She wears a fit bit. She has not noticed any significant tachycardia on it.   Patient drinks a cup of coffee, does not drink alcohol, does not smoke.   Past Medical History:  Diagnosis Date   Adenomatous colon polyp    Allergy    Anemia    as a child, no current problem   Blood transfusion without reported diagnosis    7 months old   Cataract    bilateral - MD just watching    GERD (gastroesophageal reflux disease)    Hyperlipidemia    Osteoporosis 04/15/2016   Skin cancer 2013   Skin cancer- Nose, face     Past Surgical History:  Procedure Laterality Date   CESAREAN SECTION  1981   x 1   COLONOSCOPY  2016   hx polyp, tics, sigmoid colon mod tortuous Dr Boyd Kerbs,    EYE SURGERY Bilateral    retina repair x 2   LAPAROTOMY  1979   removal of ovarian cyst   nissan fundiplication  07/20/1441   REPAIR OF COMPLEX TRACTION RETINAL DETACHMENT Bilateral 01/2019     Social History   Tobacco Use  Smoking Status Never  Smokeless Tobacco Never    Social History   Substance and Sexual Activity  Alcohol Use Yes   Alcohol/week: 1.0 standard drink   Types: 1 Glasses of wine per week     Family History  Problem Relation Age of Onset   Arthritis Mother    Heart disease Mother    Vision loss Mother    Stroke Mother    Depression Sister    Cancer Maternal Grandmother    Cancer Maternal Grandfather    Early  death Maternal Grandfather    Heart disease Maternal Grandfather    Early death Paternal Grandfather    Heart disease Paternal Grandfather    Depression Sister    Diabetes Other        Juvenile   Colon cancer Neg Hx    Rectal cancer Neg Hx    Prostate cancer Neg Hx      Current Outpatient Medications on File Prior to Visit  Medication Sig Dispense Refill   alendronate (FOSAMAX) 70 MG tablet Take 1 tablet (70 mg total) by mouth every 7 (seven) days. Take with a full glass of water on an empty stomach. 12 tablet 3   aspirin 81 MG tablet Take 81 mg by mouth daily.     Biotin 1000 MCG CHEW Chew by mouth.     bisacodyl (BISACODYL) 5 MG EC tablet Take 5 mg by mouth daily as needed for moderate constipation. Dulcolax 5 mg tab take as directed for colonoscopy prep.     Calcium Carb-Cholecalciferol (CALCIUM 600 + D PO) Take 600 mg by mouth once.     chlorpheniramine (CHLOR-TRIMETON) 4 MG tablet Take 4 mg by mouth 2 (two) times daily as  needed for allergies.     Cholecalciferol (VITAMIN D) 50 MCG (2000 UT) CAPS Take by mouth.     co-enzyme Q-10 30 MG capsule Take 30 mg by mouth 3 (three) times daily.     Cranberry-Cholecalciferol 4200-500 MG-UNIT CAPS Take by mouth.     docusate sodium (COLACE) 250 MG capsule Take 250 mg by mouth daily.     LYSINE PO Take by mouth.     Methylcellulose, Laxative, (FIBER THERAPY PO) Take by mouth.     Multiple Vitamin (MULTIVITAMIN) tablet Take 1 tablet by mouth daily.     Probiotic Product (PROBIOTIC PO) Take by mouth.     simvastatin (ZOCOR) 10 MG tablet Take 1 tablet (10 mg total) by mouth at bedtime. 90 tablet 3   triamcinolone cream (KENALOG) 0.1 % Apply 1 application topically 2 (two) times daily.     vitamin C (ASCORBIC ACID) 500 MG tablet Take 500 mg by mouth daily.     No current facility-administered medications on file prior to visit.    Cardiovascular and other pertinent studies:  EKG 06/24/2021: Sinus rhythm 69 bpm Occasional PAC    Old  anterior infarct     Recent labs: 05/27/2021: Glucose 101, BUN/Cr 20/0.66. EGFR >60. Na/K 142/4.9. Rest of the CMP normal H/H 13/41. MCV 91. Platelets 318 HbA1C 5.6% Chol 162, TG 92, HDL 72, LDL 72   Review of Systems  Cardiovascular:  Positive for palpitations. Negative for chest pain, dyspnea on exertion, leg swelling and syncope.        Vitals:   06/24/21 0843 06/24/21 0853  BP: (!) 144/69 134/70  Pulse: 74 81  Temp: 98 F (36.7 C)   SpO2: 96% 96%     Body mass index is 23.59 kg/m. Filed Weights   06/24/21 0843  Weight: 129 lb (58.5 kg)     Objective:   Physical Exam Vitals and nursing note reviewed.  Constitutional:      General: She is not in acute distress. Neck:     Vascular: No JVD.  Cardiovascular:     Rate and Rhythm: Normal rate and regular rhythm.     Heart sounds: Normal heart sounds. No murmur heard. Pulmonary:     Effort: Pulmonary effort is normal.     Breath sounds: Normal breath sounds. No wheezing or rales.  Musculoskeletal:     Right lower leg: No edema.     Left lower leg: No edema.        Assessment & Recommendations:   71 y.o. Caucasian female with palpitations  Palpitations: PAC on EKG. Episodes concerning for tachyarrhythmia, but are infrequent.  Discussed vagal maneuvers. Given infrequent nature of symptoms, holding off medication or monitor for now. Will check echcoardiogram  Further recommendations after above testing   Thank you for referring the patient to [place]. Please feel free to contact with any questions.   Nigel Mormon, MD Pager: [phone] Office: [phone]

## 2021-07-20 ENCOUNTER — Ambulatory Visit
Admission: RE | Admit: 2021-07-20 | Discharge: 2021-07-20 | Disposition: A | Discharge status: Home / Self Care | Payer: Medicare Other | Source: Ambulatory Visit | Attending: Family Medicine | Admitting: Family Medicine

## 2021-07-20 DIAGNOSIS — Z1231 Encounter for screening mammogram for malignant neoplasm of breast: Secondary | ICD-10-CM | POA: Diagnosis not present

## 2021-09-03 ENCOUNTER — Telehealth: Payer: Self-pay

## 2021-09-03 MED ORDER — SIMVASTATIN 10 MG PO TABS: 10.0000 mg | ORAL_TABLET | Freq: Every day | ORAL | 3 refills | Status: DC

## 2021-09-03 NOTE — Telephone Encounter (Signed)
Pharmacy faxed a refill request for  ? ?simvastatin (ZOCOR) 10 MG tablet [[id number]]  ?  Order Details ?Dose: 10 mg Route: Oral Frequency: Daily at bedtime  ?Dispense Quantity: 90 tablet Refills: 3   ?     ?Sig: Take 1 tablet (10 mg total) by mouth at bedtime.  ?     ?Start Date: 07/02/20 End Date: --  ?Written Date: 07/02/20 Expiration Date: 07/02/21  ?Original Order:  simvastatin (ZOCOR) 10 MG tablet   ? ?

## 2021-09-03 NOTE — Telephone Encounter (Signed)
Rx sent 

## 2021-09-06 ENCOUNTER — Other Ambulatory Visit: Payer: Self-pay | Admitting: Family Medicine

## 2021-09-06 DIAGNOSIS — M810 Age-related osteoporosis without current pathological fracture: Secondary | ICD-10-CM

## 2021-09-07 ENCOUNTER — Telehealth: Payer: Self-pay

## 2021-09-07 MED ORDER — SIMVASTATIN 10 MG PO TABS: 10.0000 mg | ORAL_TABLET | Freq: Every day | ORAL | 3 refills | Status: DC

## 2021-09-07 NOTE — Telephone Encounter (Signed)
Pt called in requesting a refill of alendronate (FOSAMAX) 70 MG tablet [[id number]] to be sent to the [facility] in [facility] in [place] please. ? ?Cb#: [phone] ? ?

## 2021-09-07 NOTE — Telephone Encounter (Signed)
Rx resend to mail order-[facility] ?

## 2021-09-07 NOTE — Addendum Note (Signed)
Addended by: Colman Cater on: 09/07/2021 10:56 AM ? ? Modules accepted: Orders ? ?

## 2021-09-08 NOTE — Telephone Encounter (Signed)
Rx sent to pharmacy 09/07/21 ? ?

## 2021-09-20 NOTE — Progress Notes (Signed)
? ? ?Patient referred by Susy Frizzle, MD for palpitations ? ?Subjective:  ? ?Terri Alexander, female    DOB: 06-Feb-1951, 71 y.o.   MRN: [id number] ? ? ?Chief Complaint  ?Patient presents with  ? Palpitations  ?  3 month  ? ? ? ?HPI ? ?71 y.o. Caucasian female with palpitations ? ?At last office visit EKG revealed PACs, therefore discussed vagal maneuvers and ordered echocardiogram. Echo revealed normal LVEF and mild valvular disease. Patient now presents for follow up.  Patient brings with her a diary of symptomatic episodes.  From February until September 08, 2021 patient had no episodes.  She then had a single episode on 09/08/2021 of palpitations with heart rate approximately 90 bpm.  This episode lasted 1 hour.  Patient has had no recurrence since then. ? ?Past Medical History:  ?Diagnosis Date  ? Adenomatous colon polyp   ? Allergy   ? Anemia   ? as a child, no current problem  ? Blood transfusion without reported diagnosis   ? 17 months old  ? Cataract   ? bilateral - MD just watching   ? GERD (gastroesophageal reflux disease)   ? Hyperlipidemia   ? Osteoporosis 04/15/2016  ? Skin cancer 2013  ? Skin cancer- Nose, face  ? ? ? ?Past Surgical History:  ?Procedure Laterality Date  ? [place]  ? x 1  ? COLONOSCOPY  2016  ? hx polyp, tics, sigmoid colon mod tortuous Dr Boyd Kerbs,   ? EYE SURGERY Bilateral   ? retina repair x 2  ? LAPAROTOMY  1979  ? removal of ovarian cyst  ? nissan fundiplication  08/23/7024  ? REPAIR OF COMPLEX TRACTION RETINAL DETACHMENT Bilateral 01/2019  ? ? ? ?Social History  ? ?Tobacco Use  ?Smoking Status Never  ?Smokeless Tobacco Never  ? ? ?Social History  ? ?Substance and Sexual Activity  ?Alcohol Use Yes  ? Alcohol/week: 1.0 standard drink  ? Types: 1 Glasses of wine per week  ? ? ? ?Family History  ?Problem Relation Age of Onset  ? Arthritis Mother   ? Heart disease Mother   ? Vision loss Mother   ? Stroke Mother   ? Depression Sister   ? Transient ischemic attack Sister 21  ?  Depression Sister   ? Cancer Maternal Grandmother   ? Cancer Maternal Grandfather   ? Early death Maternal Grandfather   ? Heart disease Maternal Grandfather   ? Early death Paternal Grandfather   ? Heart disease Paternal Grandfather   ? Diabetes Other   ?     Juvenile  ? Colon cancer Neg Hx   ? Rectal cancer Neg Hx   ? Prostate cancer Neg Hx   ? ? ? ?Current Outpatient Medications on File Prior to Visit  ?Medication Sig Dispense Refill  ? alendronate (FOSAMAX) 70 MG tablet TAKE 1 TABLET BY MOUTH ONCE A WEEK ON AN EMPTY STOMACH WITH  A  FULL  GLASS  OF  WATER 12 tablet 1  ? aspirin 81 MG tablet Take 81 mg by mouth daily.    ? Biotin 1000 MCG CHEW Chew by mouth.    ? bisacodyl (BISACODYL) 5 MG EC tablet Take 5 mg by mouth daily as needed for moderate constipation. Dulcolax 5 mg tab take as directed for colonoscopy prep.    ? Calcium Carb-Cholecalciferol (CALCIUM 600 + D PO) Take 600 mg by mouth once.    ? chlorpheniramine (CHLOR-TRIMETON) 4 MG tablet Take  4 mg by mouth 2 (two) times daily as needed for allergies.    ? Cholecalciferol (VITAMIN D) 50 MCG (2000 UT) CAPS Take by mouth.    ? co-enzyme Q-10 30 MG capsule Take 30 mg by mouth 3 (three) times daily.    ? Cranberry-Cholecalciferol 4200-500 MG-UNIT CAPS Take by mouth.    ? docusate sodium (COLACE) 250 MG capsule Take 250 mg by mouth daily.    ? fluticasone (FLONASE) 50 MCG/ACT nasal spray Place 2 sprays into both nostrils daily.    ? LYSINE PO Take by mouth.    ? Methylcellulose, Laxative, (FIBER THERAPY PO) Take by mouth.    ? Multiple Vitamin (MULTIVITAMIN) tablet Take 1 tablet by mouth daily.    ? Probiotic Product (PROBIOTIC PO) Take by mouth.    ? simvastatin (ZOCOR) 10 MG tablet Take 1 tablet (10 mg total) by mouth at bedtime. 90 tablet 3  ? vitamin C (ASCORBIC ACID) 500 MG tablet Take 500 mg by mouth daily.    ? ?No current facility-administered medications on file prior to visit.  ? ? ?Cardiovascular and other pertinent studies: ?PCV ECHOCARDIOGRAM  COMPLETE 06/24/2021 ?Left ventricle cavity is normal in size and wall thickness. Normal global wall motion. Normal LV systolic function with EF 61%. Normal diastolic filling pattern. ?Structurally normal trileaflet aortic valve. No evidence of aortic stenosis. Mild (Grade I) aortic regurgitation. ?Mild (Grade I) mitral regurgitation. ?Mild tricuspid regurgitation. ?No evidence of pulmonary hypertension. ?  ?EKG 06/24/2021: ?Sinus rhythm 69 bpm ?Occasional PAC    ?Old anterior infarct ? ? ?Recent labs: ?05/27/2021: ?Glucose 101, BUN/Cr 20/0.66. EGFR >60. Na/K 142/4.9. Rest of the CMP normal ?H/H 13/41. MCV 91. Platelets 318 ?HbA1C 5.6% ?Chol 162, TG 92, HDL 72, LDL 72 ? ? ?Review of Systems  ?Cardiovascular:  Positive for palpitations (rare). Negative for chest pain, dyspnea on exertion, leg swelling and syncope.  ? ?   ? ? ?Vitals:  ? 09/21/21 0958  ?BP: 133/73  ?Pulse: 71  ?Resp: 17  ?Temp: 97.9 ?F (36.6 ?C)  ?SpO2: 98%  ? ? ? ?Body mass index is 23.78 kg/m?. Danley Danker Weights  ? 09/21/21 0958  ?Weight: 130 lb (59 kg)  ? ? ? ?Objective:  ? Physical Exam ?Vitals and nursing note reviewed.  ?Constitutional:   ?   General: She is not in acute distress. ?Neck:  ?   Vascular: No JVD.  ?Cardiovascular:  ?   Rate and Rhythm: Normal rate and regular rhythm.  ?   Heart sounds: Normal heart sounds. No murmur heard. ?Pulmonary:  ?   Effort: Pulmonary effort is normal.  ?   Breath sounds: Normal breath sounds. No wheezing or rales.  ?Musculoskeletal:  ?   Right lower leg: No edema.  ?   Left lower leg: No edema.  ?Physical exam unchanged prior to previous office visit. ? ?   ? ?Assessment & Recommendations:  ? ?71 y.o. Caucasian female with palpitations ? ?Palpitations: ?PAC on previous EKG ?Patient's episodes of palpitations continue to be infrequent.  She is aware of vagal maneuvers.  Given that symptoms are rare we will hold off on medication management at this time. ?Reviewed and discussed results of echocardiogram, details  above.  Echocardiogram revealed preserved LVEF with mild valvular disease. ?Counseled patient regarding signs and symptoms that would warrant further evaluation including change in frequency or characteristics of her palpitation episodes. ? ?Patient is otherwise stable from a cardiovascular standpoint.  We will follow-up as needed. ? ? ?Piedad Standiford  Royston Sinner, PA-C ?09/21/2021, 10:55 AM ?Office: [phone] ?

## 2021-09-21 ENCOUNTER — Ambulatory Visit: Payer: Medicare Other | Admitting: Student

## 2021-09-21 ENCOUNTER — Ambulatory Visit: Payer: Medicare Other | Admitting: Cardiology

## 2021-09-21 ENCOUNTER — Encounter: Payer: Self-pay | Admitting: Student

## 2021-09-21 VITALS — BP 133/73 | HR 71 | Temp 97.9°F | Resp 17 | Ht 62.0 in | Wt 130.0 lb

## 2021-09-21 DIAGNOSIS — R002 Palpitations: Secondary | ICD-10-CM

## 2021-12-23 DIAGNOSIS — L821 Other seborrheic keratosis: Secondary | ICD-10-CM | POA: Diagnosis not present

## 2021-12-23 DIAGNOSIS — L814 Other melanin hyperpigmentation: Secondary | ICD-10-CM | POA: Diagnosis not present

## 2021-12-23 DIAGNOSIS — L570 Actinic keratosis: Secondary | ICD-10-CM | POA: Diagnosis not present

## 2021-12-23 DIAGNOSIS — L559 Sunburn, unspecified: Secondary | ICD-10-CM | POA: Diagnosis not present

## 2021-12-23 DIAGNOSIS — D225 Melanocytic nevi of trunk: Secondary | ICD-10-CM | POA: Diagnosis not present

## 2021-12-23 DIAGNOSIS — Z85828 Personal history of other malignant neoplasm of skin: Secondary | ICD-10-CM | POA: Diagnosis not present

## 2021-12-23 DIAGNOSIS — Z08 Encounter for follow-up examination after completed treatment for malignant neoplasm: Secondary | ICD-10-CM | POA: Diagnosis not present

## 2022-02-17 DIAGNOSIS — Z23 Encounter for immunization: Secondary | ICD-10-CM | POA: Diagnosis not present

## 2022-03-24 ENCOUNTER — Other Ambulatory Visit: Payer: Self-pay | Admitting: Family Medicine

## 2022-03-24 DIAGNOSIS — M810 Age-related osteoporosis without current pathological fracture: Secondary | ICD-10-CM

## 2022-03-24 NOTE — Telephone Encounter (Signed)
Requested medication (s) are due for refill today:   Yes  Requested medication (s) are on the active medication list:   Yes  Future visit scheduled:   Yes 1/22/20224     LOV 06/09/2021   Last ordered: 09/07/2021 #12, 1 refill  Returned because labs are due per protocol.   Provider to review for refills before upcoming appt.     Requested Prescriptions  Pending Prescriptions Disp Refills   alendronate (FOSAMAX) 70 MG tablet [Pharmacy Med Name: Alendronate Sodium 70 MG Oral Tablet] 12 tablet 0    Sig: TAKE 1 TABLET BY MOUTH ONCE A WEEK ON AN EMPTY STOMACH WITH  A  FULL  GLASS  OF  WATER     Endocrinology:  Bisphosphonates Failed - 03/24/2022 10:16 AM      Failed - Vitamin D in normal range and within 360 days    Vit D, 25-Hydroxy  Date Value Ref Range Status  06/11/2020 61 30 - 100 ng/mL Final    Comment:    Vitamin D Status         25-OH Vitamin D: . Deficiency:                    <20 ng/mL Insufficiency:             20 - 29 ng/mL Optimal:                 > or = 30 ng/mL . For 25-OH Vitamin D testing on patients on  D2-supplementation and patients for whom quantitation  of D2 and D3 fractions is required, the [facility](TM) 25-OH VIT D, (D2,D3), LC/MS/MS is recommended: order  code [phone] (patients >11yr). See Note 1 . Note 1 . For additional information, please refer to  http://education.[facility].com/faq/FAQ199  (This link is being provided for informational/ educational purposes only.)          Failed - Mg Level in normal range and within 360 days    No results found for: "MG"       Failed - Phosphate in normal range and within 360 days    No results found for: "PHOS"       Failed - eGFR is 30 or above and within 360 days    GFR, Est African American  Date Value Ref Range Status  09/18/2019 102 > OR = 60 mL/min/1.731mFinal   GFR, Est Non African American  Date Value Ref Range Status  09/18/2019 88 > OR = 60 mL/min/1.7331minal   GFR, Estimated   Date Value Ref Range Status  12/09/2020 >60 >60 mL/min Final    Comment:    (NOTE) Calculated using the CKD-EPI Creatinine Equation (2021)    eGFR  Date Value Ref Range Status  12/25/2020 95 > OR = 60 mL/min/1.45m33mnal    Comment:    The eGFR is based on the CKD-EPI 2021 equation. To calculate  the new eGFR from a previous Creatinine or Cystatin C result, go to https://www.kidney.org/professionals/ kdoqi/gfr%5Fcalculator          Failed - Valid encounter within last 12 months    Recent Outpatient Visits           9 months ago Routine general medical examination at a health care facility   [facility]   1 year ago Diverticulitis of large intestine with perforation without abscess or bleeding   [facility]   1 year ago  LLQ abdominal pain   [facility], NP   1 year ago Routine general medical examination at a health care facility   [place], [place] Nunnery, MD   1 year ago Full body hives   [place], NP       Future Appointments             In 2 months Terri Alexander, Terri Mcgee, MD [employer], Short in normal range and within 360 days    Calcium  Date Value Ref Range Status  05/27/2021 9.3 8.6 - 10.4 mg/dL Final         Passed - Cr in normal range and within 360 days    Creat  Date Value Ref Range Status  05/27/2021 0.66 0.60 - 1.00 mg/dL Final         Passed - Bone Mineral Density or Dexa Scan completed in the last 2 years

## 2022-04-01 ENCOUNTER — Other Ambulatory Visit: Payer: Self-pay | Admitting: Family Medicine

## 2022-04-01 DIAGNOSIS — M810 Age-related osteoporosis without current pathological fracture: Secondary | ICD-10-CM

## 2022-04-19 ENCOUNTER — Ambulatory Visit (INDEPENDENT_AMBULATORY_CARE_PROVIDER_SITE_OTHER): Payer: Medicare Other | Admitting: Family Medicine

## 2022-04-19 VITALS — BP 112/70 | HR 91 | Temp 97.5°F | Ht 62.0 in | Wt 127.0 lb

## 2022-04-19 DIAGNOSIS — K529 Noninfective gastroenteritis and colitis, unspecified: Secondary | ICD-10-CM | POA: Insufficient documentation

## 2022-04-19 DIAGNOSIS — K5730 Diverticulosis of large intestine without perforation or abscess without bleeding: Secondary | ICD-10-CM | POA: Insufficient documentation

## 2022-04-19 DIAGNOSIS — K5720 Diverticulitis of large intestine with perforation and abscess without bleeding: Secondary | ICD-10-CM | POA: Diagnosis not present

## 2022-04-19 DIAGNOSIS — K5790 Diverticulosis of intestine, part unspecified, without perforation or abscess without bleeding: Secondary | ICD-10-CM | POA: Insufficient documentation

## 2022-04-19 LAB — CBC WITH DIFFERENTIAL/PLATELET
Absolute Monocytes: 1325 cells/uL — ABNORMAL HIGH (ref 200–950)
Basophils Absolute: 18 cells/uL (ref 0–200)
Basophils Relative: 0.2 %
Eosinophils Absolute: 0 cells/uL — ABNORMAL LOW (ref 15–500)
Eosinophils Relative: 0 %
HCT: 41.1 % (ref 35.0–45.0)
Hemoglobin: 13.8 g/dL (ref 11.7–15.5)
Lymphs Abs: 1205 cells/uL (ref 850–3900)
MCH: 29.8 pg (ref 27.0–33.0)
MCHC: 33.6 g/dL (ref 32.0–36.0)
MCV: 88.8 fL (ref 80.0–100.0)
MPV: 10.2 fL (ref 7.5–12.5)
Monocytes Relative: 14.4 %
Neutro Abs: 6652 cells/uL (ref 1500–7800)
Neutrophils Relative %: 72.3 %
Platelets: 301 10*3/uL (ref 140–400)
RBC: 4.63 10*6/uL (ref 3.80–5.10)
RDW: 13 % (ref 11.0–15.0)
Total Lymphocyte: 13.1 %
WBC: 9.2 10*3/uL (ref 3.8–10.8)

## 2022-04-19 NOTE — Progress Notes (Signed)
Acute Office Visit  Subjective:     Patient ID: Terri Alexander, female    DOB: May 05, 1951, 71 y.o.   MRN: [id number]  Chief Complaint  Patient presents with   Follow-up    been having vomitting/diarrhea since 3 am Sunday    HPI Patient is in today for 1 day of nausea, vomiting, diarrhea for 12 hours on Sunday associated with chills, decreased appetite, fatigue, and lower abdominal pain. Symptoms have since resolved. She does have a history of diverticulitis, does not recall consumption of irritating foods. Has tried Imodium and Tylenol for headache, Prilosec for burping and gas. She has been able to keep down chicken broth, Gatorade, and water.   Review of Systems  Reason unable to perform ROS: see HPI.  All other systems reviewed and are negative.  Past Medical History:  Diagnosis Date   Adenomatous colon polyp    Allergy    Anemia    as a child, no current problem   Blood transfusion without reported diagnosis    41 months old   Cataract    bilateral - MD just watching    GERD (gastroesophageal reflux disease)    Hyperlipidemia    Osteoporosis 04/15/2016   Skin cancer 2013   Skin cancer- Nose, face   Past Surgical History:  Procedure Laterality Date   CESAREAN SECTION  1981   x 1   COLONOSCOPY  2016   hx polyp, tics, sigmoid colon mod tortuous Dr Boyd Kerbs,    EYE SURGERY Bilateral    retina repair x 2   LAPAROTOMY  1979   removal of ovarian cyst   nissan fundiplication  7/0/2637   REPAIR OF COMPLEX TRACTION RETINAL DETACHMENT Bilateral 01/2019   Current Outpatient Medications on File Prior to Visit  Medication Sig Dispense Refill   alendronate (FOSAMAX) 70 MG tablet TAKE 1 TABLET BY MOUTH ONCE A WEEK ON AN EMPTY STOMACH WITH A FULL GLASS OF WATER 12 tablet 0   aspirin 81 MG tablet Take 81 mg by mouth daily.     Biotin 1000 MCG CHEW Chew by mouth.     bisacodyl (BISACODYL) 5 MG EC tablet Take 5 mg by mouth daily as needed for moderate constipation. Dulcolax 5  mg tab take as directed for colonoscopy prep.     Calcium Carb-Cholecalciferol (CALCIUM 600 + D PO) Take 600 mg by mouth once.     chlorpheniramine (CHLOR-TRIMETON) 4 MG tablet Take 4 mg by mouth 2 (two) times daily as needed for allergies.     Cholecalciferol (VITAMIN D) 50 MCG (2000 UT) CAPS Take by mouth.     co-enzyme Q-10 30 MG capsule Take 30 mg by mouth 3 (three) times daily.     Cranberry-Cholecalciferol 4200-500 MG-UNIT CAPS Take by mouth.     docusate sodium (COLACE) 250 MG capsule Take 250 mg by mouth daily.     fluticasone (FLONASE) 50 MCG/ACT nasal spray Place 2 sprays into both nostrils daily.     LYSINE PO Take by mouth.     Methylcellulose, Laxative, (FIBER THERAPY PO) Take by mouth.     Multiple Vitamin (MULTIVITAMIN) tablet Take 1 tablet by mouth daily.     Probiotic Product (PROBIOTIC PO) Take by mouth.     simvastatin (ZOCOR) 10 MG tablet Take 1 tablet (10 mg total) by mouth at bedtime. 90 tablet 3   vitamin C (ASCORBIC ACID) 500 MG tablet Take 500 mg by mouth daily.     No current facility-administered medications  on file prior to visit.   No Known Allergies      Objective:    BP 112/70   Pulse 91   Temp (!) 97.5 F (36.4 C) (Oral)   Ht '5\' 2"'$  (1.575 m)   Wt 127 lb (57.6 kg)   LMP  (LMP Unknown)   SpO2 99%   BMI 23.23 kg/m    Physical Exam Vitals and nursing note reviewed.  Constitutional:      General: She is not in acute distress.    Appearance: Normal appearance. She is normal weight. She is not ill-appearing or toxic-appearing.  HENT:     Head: Normocephalic and atraumatic.  Abdominal:     General: Abdomen is flat. Bowel sounds are increased. There is no distension.     Palpations: Abdomen is soft. There is no mass.     Tenderness: There is abdominal tenderness (mild lower abdominal tenderness to deep palpation) in the suprapubic area. There is no right CVA tenderness, left CVA tenderness, guarding or rebound.  Skin:    General: Skin is warm and  dry.  Neurological:     General: No focal deficit present.     Mental Status: She is alert and oriented to person, place, and time. Mental status is at baseline.  Psychiatric:        Mood and Affect: Mood normal.        Behavior: Behavior normal.        Thought Content: Thought content normal.        Judgment: Judgment normal.     No results found for any visits on 04/19/22.      Assessment & Plan:   Problem List Items Addressed This Visit       Digestive   Gastroenteritis - Primary     Other   Diverticulosis of sigmoid colon    Patients exam is reassuring and symptoms have resolved. She is pain free today and has not had any vomiting or diarrhea since yesterday afternoon. This may have been a viral illness but given her history of diverticulosis will check white blood cell count to rule out diverticulitis and need for antibiotics. Encouraged rest and hydration with clear liquids and diet as tolerated. Continue to avoid foods such as nuts, popcorn, and berries. Return to office if symptoms such as fever, abdominal pain, nausea, vomiting return.      Relevant Orders   CBC with Differential/Platelet    No orders of the defined types were placed in this encounter.   No follow-ups on file.  Rubie Maid, FNP

## 2022-04-19 NOTE — Assessment & Plan Note (Addendum)
Patients exam is reassuring and symptoms have resolved. She is pain free today and has not had any vomiting or diarrhea since yesterday afternoon. This may have been a viral illness but given her history of diverticulosis will check white blood cell count to rule out diverticulitis and need for antibiotics. Encouraged rest and hydration with clear liquids and diet as tolerated. Continue to avoid foods such as nuts, popcorn, and berries. Return to office if symptoms such as fever, abdominal pain, nausea, vomiting return.

## 2022-04-20 ENCOUNTER — Encounter: Payer: Self-pay | Admitting: Family Medicine

## 2022-04-21 ENCOUNTER — Encounter: Payer: Self-pay | Admitting: Family Medicine

## 2022-06-07 ENCOUNTER — Other Ambulatory Visit: Payer: Medicare Other

## 2022-06-07 DIAGNOSIS — E785 Hyperlipidemia, unspecified: Secondary | ICD-10-CM

## 2022-06-08 LAB — CBC WITH DIFFERENTIAL/PLATELET
Absolute Monocytes: 1121 cells/uL — ABNORMAL HIGH (ref 200–950)
Basophils Absolute: 36 cells/uL (ref 0–200)
Basophils Relative: 0.4 %
Eosinophils Absolute: 9 cells/uL — ABNORMAL LOW (ref 15–500)
Eosinophils Relative: 0.1 %
HCT: 38.2 % (ref 35.0–45.0)
Hemoglobin: 12.6 g/dL (ref 11.7–15.5)
Lymphs Abs: 2092 cells/uL (ref 850–3900)
MCH: 30.2 pg (ref 27.0–33.0)
MCHC: 33 g/dL (ref 32.0–36.0)
MCV: 91.6 fL (ref 80.0–100.0)
MPV: 10.7 fL (ref 7.5–12.5)
Monocytes Relative: 12.6 %
Neutro Abs: 5643 cells/uL (ref 1500–7800)
Neutrophils Relative %: 63.4 %
Platelets: 278 10*3/uL (ref 140–400)
RBC: 4.17 10*6/uL (ref 3.80–5.10)
RDW: 13.2 % (ref 11.0–15.0)
Total Lymphocyte: 23.5 %
WBC: 8.9 10*3/uL (ref 3.8–10.8)

## 2022-06-08 LAB — COMPLETE METABOLIC PANEL WITH GFR
AG Ratio: 1.4 (calc) (ref 1.0–2.5)
ALT: 11 U/L (ref 6–29)
AST: 15 U/L (ref 10–35)
Albumin: 3.9 g/dL (ref 3.6–5.1)
Alkaline phosphatase (APISO): 70 U/L (ref 37–153)
BUN: 20 mg/dL (ref 7–25)
CO2: 28 mmol/L (ref 20–32)
Calcium: 8.6 mg/dL (ref 8.6–10.4)
Chloride: 104 mmol/L (ref 98–110)
Creat: 0.78 mg/dL (ref 0.60–1.00)
Globulin: 2.7 g/dL (calc) (ref 1.9–3.7)
Glucose, Bld: 90 mg/dL (ref 65–99)
Potassium: 4.5 mmol/L (ref 3.5–5.3)
Sodium: 141 mmol/L (ref 135–146)
Total Bilirubin: 0.5 mg/dL (ref 0.2–1.2)
Total Protein: 6.6 g/dL (ref 6.1–8.1)
eGFR: 81 mL/min/{1.73_m2} (ref >=60)

## 2022-06-08 LAB — LIPID PANEL
Cholesterol: 145 mg/dL (ref <200)
HDL: 62 mg/dL (ref >=50)
LDL Cholesterol (Calc): 64 mg/dL (calc)
Non-HDL Cholesterol (Calc): 83 mg/dL (calc) (ref <130)
Total CHOL/HDL Ratio: 2.3 (calc) (ref <5.0)
Triglycerides: 106 mg/dL (ref <150)

## 2022-06-09 ENCOUNTER — Other Ambulatory Visit: Payer: Self-pay | Admitting: Family Medicine

## 2022-06-09 DIAGNOSIS — Z1231 Encounter for screening mammogram for malignant neoplasm of breast: Secondary | ICD-10-CM

## 2022-06-11 ENCOUNTER — Encounter: Payer: Self-pay | Admitting: Family Medicine

## 2022-06-11 ENCOUNTER — Ambulatory Visit (INDEPENDENT_AMBULATORY_CARE_PROVIDER_SITE_OTHER): Payer: Medicare Other | Admitting: Family Medicine

## 2022-06-11 VITALS — BP 120/64 | HR 71 | Temp 97.5°F | Ht 62.0 in | Wt 131.0 lb

## 2022-06-11 DIAGNOSIS — Z0000 Encounter for general adult medical examination without abnormal findings: Secondary | ICD-10-CM | POA: Diagnosis not present

## 2022-06-11 MED ORDER — METOPROLOL TARTRATE 25 MG PO TABS: 25.0000 mg | ORAL_TABLET | Freq: Two times a day (BID) | ORAL | 3 refills | Status: DC | PRN

## 2022-06-11 NOTE — Progress Notes (Signed)
Subjective:    Patient ID: Terri Alexander, female    DOB: Apr 12, 1951, 72 y.o.   MRN: [id number]  HPI Patient is here today for complete physical exam.  She denies any chest pain, shortness of breath, dyspnea on exertion.  Her last mammogram was in March of last year.  She is already scheduled her mammogram for this March.  Her last bone density test was in 2022 and showed borderline osteoporosis.  She is due for repeat bone density test next year.  She is taking calcium and vitamin D.  Her last colonoscopy was in 2021 and they recommended no repeat colonoscopy due to age.  She does not require Pap smear.  Her immunizations are up-to-date: Immunization History  Administered Date(s) Administered   Fluad Quad(high Dose 65+) 02/14/2019, 01/17/2020   Influenza, High Dose Seasonal PF 02/23/2016, 01/05/2017   Influenza,inj,Quad PF,6+ Mos 03/10/2015, 02/08/2018   Influenza-Unspecified 02/08/2018, 01/16/2021, 02/17/2022   PFIZER(Purple Top)SARS-COV-2 Vaccination 09/12/2019, 10/02/2019, 04/04/2020   Pfizer Covid-19 Vaccine Bivalent Booster 102yr & up 08/16/2020, 02/11/2021, 11/30/2021   Pneumococcal Conjugate-13 03/15/2016   Pneumococcal Polysaccharide-23 03/16/2017   Tdap 03/10/2015   Zoster Recombinat (Shingrix) 05/29/2018, 09/26/2018   Zoster, Live 04/23/2013   She denies any falls or depression or memory loss. Past Medical History:  Diagnosis Date   Adenomatous colon polyp    Allergy    Anemia    as a child, no current problem   Blood transfusion without reported diagnosis    985 monthsold   Cataract    bilateral - MD just watching    GERD (gastroesophageal reflux disease)    Hyperlipidemia    Osteoporosis 04/15/2016   Skin cancer 2013   Skin cancer- Nose, face   Current Outpatient Medications on File Prior to Visit  Medication Sig Dispense Refill   alendronate (FOSAMAX) 70 MG tablet TAKE 1 TABLET BY MOUTH ONCE A WEEK ON AN EMPTY STOMACH WITH A FULL GLASS OF WATER 12 tablet 0    aspirin 81 MG tablet Take 81 mg by mouth daily.     Biotin 1000 MCG CHEW Chew by mouth.     bisacodyl (BISACODYL) 5 MG EC tablet Take 5 mg by mouth daily as needed for moderate constipation. Dulcolax 5 mg tab take as directed for colonoscopy prep.     Calcium Carb-Cholecalciferol (CALCIUM 600 + D PO) Take 600 mg by mouth once.     chlorpheniramine (CHLOR-TRIMETON) 4 MG tablet Take 4 mg by mouth 2 (two) times daily as needed for allergies.     Cholecalciferol (VITAMIN D) 50 MCG (2000 UT) CAPS Take by mouth.     co-enzyme Q-10 30 MG capsule Take 30 mg by mouth 3 (three) times daily.     Cranberry-Cholecalciferol 4200-500 MG-UNIT CAPS Take by mouth.     docusate sodium (COLACE) 250 MG capsule Take 250 mg by mouth daily.     fluticasone (FLONASE) 50 MCG/ACT nasal spray Place 2 sprays into both nostrils daily.     LYSINE PO Take by mouth.     Methylcellulose, Laxative, (FIBER THERAPY PO) Take by mouth.     Multiple Vitamin (MULTIVITAMIN) tablet Take 1 tablet by mouth daily.     Probiotic Product (PROBIOTIC PO) Take by mouth.     vitamin C (ASCORBIC ACID) 500 MG tablet Take 500 mg by mouth daily.     No current facility-administered medications on file prior to visit.   No Known Allergies Social History   Socioeconomic History  Marital status: Married    Spouse name: Not on file   Number of children: Not on file   Years of education: Not on file   Highest education level: Not on file  Occupational History   Not on file  Tobacco Use   Smoking status: Never   Smokeless tobacco: Never  Vaping Use   Vaping Use: Never used  Substance and Sexual Activity   Alcohol use: Yes    Alcohol/week: 1.0 standard drink of alcohol    Types: 1 Glasses of wine per week   Drug use: No   Sexual activity: Not Currently    Birth control/protection: Post-menopausal  Other Topics Concern   Not on file  Social History Narrative   Not on file   Social Determinants of Health   Financial Resource  Strain: Not on file  Food Insecurity: Not on file  Transportation Needs: Not on file  Physical Activity: Not on file  Stress: Not on file  Social Connections: Not on file  Intimate Partner Violence: Not on file      Review of Systems  All other systems reviewed and are negative.      Objective:   Physical Exam Vitals reviewed.  Constitutional:      General: She is not in acute distress.    Appearance: Normal appearance. She is not ill-appearing, toxic-appearing or diaphoretic.  HENT:     Head: Normocephalic and atraumatic.     Right Ear: Tympanic membrane and ear canal normal.     Left Ear: Tympanic membrane and ear canal normal.     Nose: Nose normal.     Mouth/Throat:     Mouth: Mucous membranes are moist.     Pharynx: Oropharynx is clear. No oropharyngeal exudate or posterior oropharyngeal erythema.  Eyes:     Extraocular Movements: Extraocular movements intact.     Conjunctiva/sclera: Conjunctivae normal.     Pupils: Pupils are equal, round, and reactive to light.  Neck:     Vascular: No carotid bruit.  Cardiovascular:     Rate and Rhythm: Normal rate and regular rhythm.     Pulses: Normal pulses.     Heart sounds: Normal heart sounds. No murmur heard.    No friction rub. No gallop.  Pulmonary:     Effort: Pulmonary effort is normal. No respiratory distress.     Breath sounds: Normal breath sounds. No stridor. No wheezing, rhonchi or rales.  Chest:     Chest wall: No tenderness.  Abdominal:     General: Abdomen is flat. Bowel sounds are normal. There is no distension.     Palpations: Abdomen is soft. There is no mass.     Tenderness: There is no abdominal tenderness. There is no guarding or rebound.     Hernia: No hernia is present.  Musculoskeletal:     Cervical back: Normal range of motion and neck supple. No tenderness.     Right lower leg: No edema.     Left lower leg: No edema.  Lymphadenopathy:     Cervical: No cervical adenopathy.  Skin:     General: Skin is warm.     Coloration: Skin is not jaundiced or pale.     Findings: No bruising, erythema, lesion or rash.  Neurological:     General: No focal deficit present.     Mental Status: She is alert and oriented to person, place, and time. Mental status is at baseline.     Cranial Nerves: No cranial nerve  deficit.     Sensory: No sensory deficit.     Motor: No weakness.     Coordination: Coordination normal.     Gait: Gait normal.     Deep Tendon Reflexes: Reflexes normal.  Psychiatric:        Mood and Affect: Mood normal.        Behavior: Behavior normal.        Thought Content: Thought content normal.        Judgment: Judgment normal.           Assessment & Plan:  General medical exam Physical exam today is completely normal.  Immunizations are up-to-date.  Colonoscopy and Pap smear up-to-date.  Mammogram has been scheduled.  Repeat bone density test next year.  Continue calcium and vitamin D.  Reviewed lab work.  Cholesterol is outstanding.  Discontinue simvastatin.  Regular anticipatory guidance is provided Her most recent lab work is shown below: Lab on 06/07/2022  Component Date Value Ref Range Status   WBC 06/07/2022 8.9  3.8 - 10.8 Thousand/uL Final   RBC 06/07/2022 4.17  3.80 - 5.10 Million/uL Final   Hemoglobin 06/07/2022 12.6  11.7 - 15.5 g/dL Final   HCT 06/07/2022 38.2  35.0 - 45.0 % Final   MCV 06/07/2022 91.6  80.0 - 100.0 fL Final   MCH 06/07/2022 30.2  27.0 - 33.0 pg Final   MCHC 06/07/2022 33.0  32.0 - 36.0 g/dL Final   RDW 06/07/2022 13.2  11.0 - 15.0 % Final   Platelets 06/07/2022 278  140 - 400 Thousand/uL Final   MPV 06/07/2022 10.7  7.5 - 12.5 fL Final   Neutro Abs 06/07/2022 5,643  1,500 - 7,800 cells/uL Final   Lymphs Abs 06/07/2022 2,092  850 - 3,900 cells/uL Final   Absolute Monocytes 06/07/2022 1,121 (H)  200 - 950 cells/uL Final   Eosinophils Absolute 06/07/2022 9 (L)  15 - 500 cells/uL Final   Basophils Absolute 06/07/2022 36  0 -  200 cells/uL Final   Neutrophils Relative % 06/07/2022 63.4  % Final   Total Lymphocyte 06/07/2022 23.5  % Final   Monocytes Relative 06/07/2022 12.6  % Final   Eosinophils Relative 06/07/2022 0.1  % Final   Basophils Relative 06/07/2022 0.4  % Final   Glucose, Bld 06/07/2022 90  65 - 99 mg/dL Final   Comment: .            Fasting reference interval .    BUN 06/07/2022 20  7 - 25 mg/dL Final   Creat 06/07/2022 0.78  0.60 - 1.00 mg/dL Final   eGFR 06/07/2022 81  > OR = 60 mL/min/1.43m Final   BUN/Creatinine Ratio 06/07/2022 SEE NOTE:  6 - 22 (calc) Final   Comment:    Not Reported: BUN and Creatinine are within    reference range. .    Sodium 06/07/2022 141  135 - 146 mmol/L Final   Potassium 06/07/2022 4.5  3.5 - 5.3 mmol/L Final   Chloride 06/07/2022 104  98 - 110 mmol/L Final   CO2 06/07/2022 28  20 - 32 mmol/L Final   Calcium 06/07/2022 8.6  8.6 - 10.4 mg/dL Final   Total Protein 06/07/2022 6.6  6.1 - 8.1 g/dL Final   Albumin 06/07/2022 3.9  3.6 - 5.1 g/dL Final   Globulin 06/07/2022 2.7  1.9 - 3.7 g/dL (calc) Final   AG Ratio 06/07/2022 1.4  1.0 - 2.5 (calc) Final   Total Bilirubin 06/07/2022 0.5  0.2 -  1.2 mg/dL Final   Alkaline phosphatase (APISO) 06/07/2022 70  37 - 153 U/L Final   AST 06/07/2022 15  10 - 35 U/L Final   ALT 06/07/2022 11  6 - 29 U/L Final   Cholesterol 06/07/2022 145  <200 mg/dL Final   HDL 06/07/2022 62  > OR = 50 mg/dL Final   Triglycerides 06/07/2022 106  <150 mg/dL Final   LDL Cholesterol (Calc) 06/07/2022 64  mg/dL (calc) Final   Comment: Reference range: <100 . Desirable range <100 mg/dL for primary prevention;   <70 mg/dL for patients with CHD or diabetic patients  with > or = 2 CHD risk factors. Marland Kitchen LDL-C is now calculated using the Martin-Hopkins  calculation, which is a validated novel method providing  better accuracy than the Friedewald equation in the  estimation of LDL-C.  Cresenciano Genre et al. Annamaria Helling. 5631;497(02): 2061-2068   (http://education.[facility].com/faq/FAQ164)    Total CHOL/HDL Ratio 06/07/2022 2.3  <5.0 (calc) Final   Non-HDL Cholesterol (Calc) 06/07/2022 83  <130 mg/dL (calc) Final   Comment: For patients with diabetes plus 1 major ASCVD risk  factor, treating to a non-HDL-C goal of <100 mg/dL  (LDL-C of <70 mg/dL) is considered a therapeutic  option.    Labs are outstanding.  She is already scheduled for mammogram.  She does not require Pap smear and colonoscopy is up-to-date long-term immunizations.  Therefore her regular preventative care is up-to-date.  I recommended trying Flonase for eustachian tube dysfunction as her exam today is normal.  Await the results of the Holter monitor.  Consult cardiology for this

## 2022-06-28 ENCOUNTER — Other Ambulatory Visit: Payer: Self-pay

## 2022-06-28 DIAGNOSIS — M810 Age-related osteoporosis without current pathological fracture: Secondary | ICD-10-CM

## 2022-06-28 MED ORDER — ALENDRONATE SODIUM 70 MG PO TABS: ORAL_TABLET | ORAL | 3 refills | Status: DC

## 2022-07-12 ENCOUNTER — Other Ambulatory Visit: Payer: Self-pay | Admitting: Family Medicine

## 2022-07-12 MED ORDER — NIRMATRELVIR/RITONAVIR (PAXLOVID)TABLET: 3.0000 | ORAL_TABLET | Freq: Two times a day (BID) | ORAL | 0 refills | Status: AC

## 2022-07-28 ENCOUNTER — Ambulatory Visit
Admission: RE | Admit: 2022-07-28 | Discharge: 2022-07-28 | Disposition: A | Discharge status: Home / Self Care | Payer: Medicare Other | Source: Ambulatory Visit | Attending: Family Medicine | Admitting: Family Medicine

## 2022-07-28 DIAGNOSIS — Z1231 Encounter for screening mammogram for malignant neoplasm of breast: Secondary | ICD-10-CM | POA: Diagnosis not present

## 2022-08-09 ENCOUNTER — Ambulatory Visit (INDEPENDENT_AMBULATORY_CARE_PROVIDER_SITE_OTHER): Payer: Medicare Other | Admitting: Family Medicine

## 2022-08-09 VITALS — BP 124/82 | HR 124 | Temp 98.4°F | Ht 62.0 in | Wt 133.0 lb

## 2022-08-09 DIAGNOSIS — I499 Cardiac arrhythmia, unspecified: Secondary | ICD-10-CM | POA: Diagnosis not present

## 2022-08-09 DIAGNOSIS — I4891 Unspecified atrial fibrillation: Secondary | ICD-10-CM

## 2022-08-09 MED ORDER — METOPROLOL SUCCINATE ER 50 MG PO TB24: 50.0000 mg | ORAL_TABLET | Freq: Every day | ORAL | 3 refills | Status: DC

## 2022-08-09 MED ORDER — FUROSEMIDE 40 MG PO TABS: 40.0000 mg | ORAL_TABLET | Freq: Every day | ORAL | 3 refills | Status: DC

## 2022-08-09 NOTE — Progress Notes (Signed)
Subjective:    Patient ID: Terri Alexander, female    DOB: 09-15-1950, 72 y.o.   MRN: [id number]   Wt Readings from Last 3 Encounters:  08/09/22 133 lb (60.3 kg)  06/11/22 131 lb (59.4 kg)  04/19/22 127 lb (57.6 kg)   Patient states that ever since she had COVID, she has been having chest congestion and cough.  She reports easy fatigability and shortness of breath.  Today on examination she has an irregularly irregular rhythm with tachycardia.  She has pitting edema in her extremities and bibasilar crackles in both lungs.  EKG today reveals atrial fibrillation with heart rate of 140 bpm Past Medical History:  Diagnosis Date   Adenomatous colon polyp    Allergy    Anemia    as a child, no current problem   Blood transfusion without reported diagnosis    58 months old   Cataract    bilateral - MD just watching    GERD (gastroesophageal reflux disease)    Hyperlipidemia    Osteoporosis 04/15/2016   Skin cancer 2013   Skin cancer- Nose, face   Current Outpatient Medications on File Prior to Visit  Medication Sig Dispense Refill   alendronate (FOSAMAX) 70 MG tablet TAKE 1 TABLET BY MOUTH ONCE A WEEK ON AN EMPTY STOMACH WITH A FULL GLASS OF WATER 12 tablet 3   aspirin 81 MG tablet Take 81 mg by mouth daily.     Biotin 1000 MCG CHEW Chew by mouth.     bisacodyl (BISACODYL) 5 MG EC tablet Take 5 mg by mouth daily as needed for moderate constipation. Dulcolax 5 mg tab take as directed for colonoscopy prep.     Calcium Carb-Cholecalciferol (CALCIUM 600 + D PO) Take 600 mg by mouth once.     chlorpheniramine (CHLOR-TRIMETON) 4 MG tablet Take 4 mg by mouth 2 (two) times daily as needed for allergies.     Cholecalciferol (VITAMIN D) 50 MCG (2000 UT) CAPS Take by mouth.     co-enzyme Q-10 30 MG capsule Take 30 mg by mouth 3 (three) times daily.     Cranberry-Cholecalciferol 4200-500 MG-UNIT CAPS Take by mouth.     docusate sodium (COLACE) 250 MG capsule Take 250 mg by mouth daily.      fluticasone (FLONASE) 50 MCG/ACT nasal spray Place 2 sprays into both nostrils daily.     LYSINE PO Take by mouth.     Methylcellulose, Laxative, (FIBER THERAPY PO) Take by mouth.     metoprolol tartrate (LOPRESSOR) 25 MG tablet Take 1 tablet (25 mg total) by mouth 2 (two) times daily as needed (fast heartbeats). 60 tablet 3   Multiple Vitamin (MULTIVITAMIN) tablet Take 1 tablet by mouth daily.     Probiotic Product (PROBIOTIC PO) Take by mouth.     vitamin C (ASCORBIC ACID) 500 MG tablet Take 500 mg by mouth daily.     No current facility-administered medications on file prior to visit.   No Known Allergies Social History   Socioeconomic History   Marital status: Married    Spouse name: Not on file   Number of children: Not on file   Years of education: Not on file   Highest education level: 12th grade  Occupational History   Not on file  Tobacco Use   Smoking status: Never   Smokeless tobacco: Never  Vaping Use   Vaping Use: Never used  Substance and Sexual Activity   Alcohol use: Yes    Alcohol/week:  1.0 standard drink of alcohol    Types: 1 Glasses of wine per week   Drug use: No   Sexual activity: Not Currently    Birth control/protection: Post-menopausal  Other Topics Concern   Not on file  Social History Narrative   Not on file   Social Determinants of Health   Financial Resource Strain: Low Risk  (08/09/2022)   Overall Financial Resource Strain (CARDIA)    Difficulty of Paying Living Expenses: Not hard at all  Food Insecurity: No Food Insecurity (08/09/2022)   Hunger Vital Sign    Worried About Running Out of Food in the Last Year: Never true    Ran Out of Food in the Last Year: Never true  Transportation Needs: No Transportation Needs (08/09/2022)   PRAPARE - Hydrologist (Medical): No    Lack of Transportation (Non-Medical): No  Physical Activity: Unknown (08/09/2022)   Exercise Vital Sign    Days of Exercise per Week: 3 days     Minutes of Exercise per Session: Not on file  Stress: No Stress Concern Present (08/09/2022)   [place]    Feeling of Stress : Only a little  Social Connections: Moderately Integrated (08/09/2022)   Social Connection and Isolation Panel [NHANES]    Frequency of Communication with Friends and Family: Three times a week    Frequency of Social Gatherings with Friends and Family: Twice a week    Attends Religious Services: 1 to 4 times per year    Active Member of [employer] or Organizations: No    Attends Music therapist: Not on file    Marital Status: Married  Human resources officer Violence: Not on file      Review of Systems  Respiratory:  Positive for cough.   All other systems reviewed and are negative.      Objective:   Physical Exam Vitals reviewed.  Constitutional:      General: She is not in acute distress.    Appearance: Normal appearance. She is not ill-appearing, toxic-appearing or diaphoretic.  HENT:     Head: Normocephalic and atraumatic.     Nose: Nose normal.     Mouth/Throat:     Mouth: Mucous membranes are moist.     Pharynx: Oropharynx is clear. No oropharyngeal exudate or posterior oropharyngeal erythema.  Eyes:     Extraocular Movements: Extraocular movements intact.     Conjunctiva/sclera: Conjunctivae normal.     Pupils: Pupils are equal, round, and reactive to light.  Neck:     Vascular: No carotid bruit.  Cardiovascular:     Rate and Rhythm: Tachycardia present. Rhythm irregular.     Pulses: Normal pulses.     Heart sounds: Normal heart sounds. No murmur heard.    No friction rub. No gallop.  Pulmonary:     Effort: Pulmonary effort is normal. No respiratory distress.     Breath sounds: No stridor. Rales present. No wheezing or rhonchi.  Chest:     Chest wall: No tenderness.  Abdominal:     General: Abdomen is flat. Bowel sounds are normal. There is no distension.      Palpations: Abdomen is soft. There is no mass.     Tenderness: There is no abdominal tenderness. There is no guarding or rebound.     Hernia: No hernia is present.  Musculoskeletal:     Cervical back: Normal range of motion and neck supple. No tenderness.  Right lower leg: Edema present.     Left lower leg: Edema present.  Lymphadenopathy:     Cervical: No cervical adenopathy.  Skin:    General: Skin is warm.     Coloration: Skin is not jaundiced or pale.     Findings: No bruising, erythema, lesion or rash.  Neurological:     General: No focal deficit present.     Mental Status: She is alert and oriented to person, place, and time. Mental status is at baseline.     Cranial Nerves: No cranial nerve deficit.     Sensory: No sensory deficit.     Motor: No weakness.     Coordination: Coordination normal.     Gait: Gait normal.     Deep Tendon Reflexes: Reflexes normal.  Psychiatric:        Mood and Affect: Mood normal.        Behavior: Behavior normal.        Thought Content: Thought content normal.        Judgment: Judgment normal.           Assessment & Plan:  Irregular heart rhythm - Plan: EKG 12-Lead, CBC with Differential/Platelet, COMPLETE METABOLIC PANEL WITH GFR, TSH  Atrial fibrillation, unspecified type [facility]) Patient appears to have atrial fibrillation with tachycardia.  Her exam suggest pulmonary edema with edema extremities.  Remains on antibiotics to 50 minutes.  Date improved tachycardia.  Discontinue aspirin and start Eliquis 5 mg twice daily.  Begin Lasix 40 mg daily due to pulmonary edema.  Recheck in 2 days or sooner if worse.  Seek medical attention immediately if shortness of breath worsens

## 2022-08-10 LAB — COMPLETE METABOLIC PANEL WITH GFR
AG Ratio: 1.5 (calc) (ref 1.0–2.5)
ALT: 21 U/L (ref 6–29)
AST: 20 U/L (ref 10–35)
Albumin: 4.2 g/dL (ref 3.6–5.1)
Alkaline phosphatase (APISO): 77 U/L (ref 37–153)
BUN: 20 mg/dL (ref 7–25)
CO2: 23 mmol/L (ref 20–32)
Calcium: 9.3 mg/dL (ref 8.6–10.4)
Chloride: 107 mmol/L (ref 98–110)
Creat: 0.77 mg/dL (ref 0.60–1.00)
Globulin: 2.8 g/dL (calc) (ref 1.9–3.7)
Glucose, Bld: 98 mg/dL (ref 65–99)
Potassium: 5 mmol/L (ref 3.5–5.3)
Sodium: 141 mmol/L (ref 135–146)
Total Bilirubin: 0.4 mg/dL (ref 0.2–1.2)
Total Protein: 7 g/dL (ref 6.1–8.1)
eGFR: 82 mL/min/{1.73_m2} (ref >=60)

## 2022-08-10 LAB — CBC WITH DIFFERENTIAL/PLATELET
Absolute Monocytes: 1178 cells/uL — ABNORMAL HIGH (ref 200–950)
Basophils Absolute: 131 cells/uL (ref 0–200)
Basophils Relative: 1.1 %
Eosinophils Absolute: 0 cells/uL — ABNORMAL LOW (ref 15–500)
Eosinophils Relative: 0 %
HCT: 40.2 % (ref 35.0–45.0)
Hemoglobin: 13.4 g/dL (ref 11.7–15.5)
Lymphs Abs: 2713 cells/uL (ref 850–3900)
MCH: 29.8 pg (ref 27.0–33.0)
MCHC: 33.3 g/dL (ref 32.0–36.0)
MCV: 89.5 fL (ref 80.0–100.0)
MPV: 10.7 fL (ref 7.5–12.5)
Monocytes Relative: 9.9 %
Neutro Abs: 7878 cells/uL — ABNORMAL HIGH (ref 1500–7800)
Neutrophils Relative %: 66.2 %
Platelets: 397 10*3/uL (ref 140–400)
RBC: 4.49 10*6/uL (ref 3.80–5.10)
RDW: 13.3 % (ref 11.0–15.0)
Total Lymphocyte: 22.8 %
WBC: 11.9 10*3/uL — ABNORMAL HIGH (ref 3.8–10.8)

## 2022-08-10 LAB — TSH: TSH: 2.41 mIU/L (ref 0.40–4.50)

## 2022-08-12 ENCOUNTER — Ambulatory Visit (INDEPENDENT_AMBULATORY_CARE_PROVIDER_SITE_OTHER): Payer: Medicare Other | Admitting: Family Medicine

## 2022-08-12 ENCOUNTER — Encounter: Payer: Self-pay | Admitting: Family Medicine

## 2022-08-12 VITALS — BP 122/62 | HR 120 | Ht 62.0 in | Wt 128.0 lb

## 2022-08-12 DIAGNOSIS — I4891 Unspecified atrial fibrillation: Secondary | ICD-10-CM | POA: Diagnosis not present

## 2022-08-12 NOTE — Progress Notes (Signed)
Subjective:    Patient ID: Terri Alexander, female    DOB: [birth year], 72 y.o.   MRN: [id number]  08/09/22 Wt Readings from Last 3 Encounters:  08/09/22 133 lb (60.3 kg)  06/11/22 131 lb (59.4 kg)  04/19/22 127 lb (57.6 kg)   Patient states that ever since she had COVID, she has been having chest congestion and cough.  She reports easy fatigability and shortness of breath.  Today on examination she has an irregularly irregular rhythm with tachycardia.  She has pitting edema in her extremities and bibasilar crackles in both lungs.  EKG today reveals atrial fibrillation with heart rate of 140 bpm.  At that time, my plan was:  Patient appears to have atrial fibrillation with tachycardia.  Her exam suggest pulmonary edema with edema extremities.  Remains on antibiotics to 50 minutes.  Date improved tachycardia.  Discontinue aspirin and start Eliquis 5 mg twice daily.  Begin Lasix 40 mg daily due to pulmonary edema.  Recheck in 2 days or sooner if worse.  Seek medical attention immediately if shortness of breath worsens  08/12/22 Patient has diuresed 5 lbs. despite taking Toprol-XL 50 mg daily, her heart rate is still 120 to 130 bpm.  She is still in atrial fibrillation today however her breathing has improved.  Her lungs are now clear.  She denies any chest pain.  She states occasionally she feels pressure between her shoulder blades but otherwise she is feeling well.  She certainly does not appear toxic despite persistent tachycardia. Past Medical History:  Diagnosis Date   Adenomatous colon polyp    Allergy    Anemia    as a child, no current problem   Blood transfusion without reported diagnosis    78 months old   Cataract    bilateral - MD just watching    GERD (gastroesophageal reflux disease)    Hyperlipidemia    Osteoporosis 04/15/2016   Skin cancer 2013   Skin cancer- Nose, face   Current Outpatient Medications on File Prior to Visit  Medication Sig Dispense Refill   alendronate  (FOSAMAX) 70 MG tablet TAKE 1 TABLET BY MOUTH ONCE A WEEK ON AN EMPTY STOMACH WITH A FULL GLASS OF WATER 12 tablet 3   aspirin 81 MG tablet Take 81 mg by mouth daily.     Biotin 1000 MCG CHEW Chew by mouth.     bisacodyl (BISACODYL) 5 MG EC tablet Take 5 mg by mouth daily as needed for moderate constipation. Dulcolax 5 mg tab take as directed for colonoscopy prep.     Calcium Carb-Cholecalciferol (CALCIUM 600 + D PO) Take 600 mg by mouth once.     chlorpheniramine (CHLOR-TRIMETON) 4 MG tablet Take 4 mg by mouth 2 (two) times daily as needed for allergies.     Cholecalciferol (VITAMIN D) 50 MCG (2000 UT) CAPS Take by mouth.     co-enzyme Q-10 30 MG capsule Take 30 mg by mouth 3 (three) times daily.     Cranberry-Cholecalciferol 4200-500 MG-UNIT CAPS Take by mouth.     docusate sodium (COLACE) 250 MG capsule Take 250 mg by mouth daily.     fluticasone (FLONASE) 50 MCG/ACT nasal spray Place 2 sprays into both nostrils daily.     furosemide (LASIX) 40 MG tablet Take 1 tablet (40 mg total) by mouth daily. 30 tablet 3   LYSINE PO Take by mouth.     Methylcellulose, Laxative, (FIBER THERAPY PO) Take by mouth.     metoprolol  succinate (TOPROL-XL) 50 MG 24 hr tablet Take 1 tablet (50 mg total) by mouth daily. Take with or immediately following a meal. 30 tablet 3   Multiple Vitamin (MULTIVITAMIN) tablet Take 1 tablet by mouth daily.     Probiotic Product (PROBIOTIC PO) Take by mouth.     vitamin C (ASCORBIC ACID) 500 MG tablet Take 500 mg by mouth daily.     No current facility-administered medications on file prior to visit.   No Known Allergies Social History   Socioeconomic History   Marital status: Married    Spouse name: Not on file   Number of children: Not on file   Years of education: Not on file   Highest education level: 12th grade  Occupational History   Not on file  Tobacco Use   Smoking status: Never   Smokeless tobacco: Never  Vaping Use   Vaping Use: Never used  Substance  and Sexual Activity   Alcohol use: Yes    Alcohol/week: 1.0 standard drink of alcohol    Types: 1 Glasses of wine per week   Drug use: No   Sexual activity: Not Currently    Birth control/protection: Post-menopausal  Other Topics Concern   Not on file  Social History Narrative   Not on file   Social Determinants of Health   Financial Resource Strain: Low Risk  (08/09/2022)   Overall Financial Resource Strain (CARDIA)    Difficulty of Paying Living Expenses: Not hard at all  Food Insecurity: No Food Insecurity (08/09/2022)   Hunger Vital Sign    Worried About Running Out of Food in the Last Year: Never true    Ran Out of Food in the Last Year: Never true  Transportation Needs: No Transportation Needs (08/09/2022)   PRAPARE - Hydrologist (Medical): No    Lack of Transportation (Non-Medical): No  Physical Activity: Unknown (08/09/2022)   Exercise Vital Sign    Days of Exercise per Week: 3 days    Minutes of Exercise per Session: Not on file  Stress: No Stress Concern Present (08/09/2022)   [place]    Feeling of Stress : Only a little  Social Connections: Moderately Integrated (08/09/2022)   Social Connection and Isolation Panel [NHANES]    Frequency of Communication with Friends and Family: Three times a week    Frequency of Social Gatherings with Friends and Family: Twice a week    Attends Religious Services: 1 to 4 times per year    Active Member of [employer] or Organizations: No    Attends Music therapist: Not on file    Marital Status: Married  Human resources officer Violence: Not on file      Review of Systems  All other systems reviewed and are negative.      Objective:   Physical Exam Vitals reviewed.  Constitutional:      General: She is not in acute distress.    Appearance: Normal appearance. She is not ill-appearing, toxic-appearing or diaphoretic.  HENT:      Head: Normocephalic and atraumatic.     Nose: Nose normal.     Mouth/Throat:     Mouth: Mucous membranes are moist.     Pharynx: Oropharynx is clear. No oropharyngeal exudate or posterior oropharyngeal erythema.  Eyes:     Extraocular Movements: Extraocular movements intact.     Conjunctiva/sclera: Conjunctivae normal.     Pupils: Pupils are equal, round, and  reactive to light.  Neck:     Vascular: No carotid bruit.  Cardiovascular:     Rate and Rhythm: Tachycardia present. Rhythm irregular.     Pulses: Normal pulses.     Heart sounds: Normal heart sounds. No murmur heard.    No friction rub. No gallop.  Pulmonary:     Effort: Pulmonary effort is normal. No respiratory distress.     Breath sounds: No stridor. No wheezing or rhonchi.  Chest:     Chest wall: No tenderness.  Abdominal:     General: Abdomen is flat. Bowel sounds are normal. There is no distension.     Palpations: Abdomen is soft. There is no mass.     Tenderness: There is no abdominal tenderness. There is no guarding or rebound.     Hernia: No hernia is present.  Musculoskeletal:     Cervical back: Normal range of motion and neck supple. No tenderness.     Right lower leg: Edema present.     Left lower leg: Edema present.  Lymphadenopathy:     Cervical: No cervical adenopathy.  Skin:    General: Skin is warm.     Coloration: Skin is not jaundiced or pale.     Findings: No bruising, erythema, lesion or rash.  Neurological:     General: No focal deficit present.     Mental Status: She is alert and oriented to person, place, and time. Mental status is at baseline.     Cranial Nerves: No cranial nerve deficit.     Sensory: No sensory deficit.     Motor: No weakness.     Coordination: Coordination normal.     Gait: Gait normal.     Deep Tendon Reflexes: Reflexes normal.  Psychiatric:        Mood and Affect: Mood normal.        Behavior: Behavior normal.        Thought Content: Thought content normal.         Judgment: Judgment normal.           Assessment & Plan:  Atrial fibrillation, unspecified type [facility]) Patient had persistent atrial fibrillation with tachycardia.  Heart rate is not well-controlled.  Increase Toprol-XL to 100 mg daily and recheck next week.  If she develops severe shortness of breath or chest pain she is to go to the emergency room immediately.  Decrease Lasix to 20 mg a day to try to maintain her weight at this current level.  Likely can stop Lasix once we have her heart rate under control.

## 2022-08-16 ENCOUNTER — Telehealth: Payer: Self-pay | Admitting: Family Medicine

## 2022-08-16 ENCOUNTER — Other Ambulatory Visit: Payer: Self-pay

## 2022-08-16 DIAGNOSIS — I499 Cardiac arrhythmia, unspecified: Secondary | ICD-10-CM

## 2022-08-16 DIAGNOSIS — I4891 Unspecified atrial fibrillation: Secondary | ICD-10-CM

## 2022-08-16 DIAGNOSIS — R002 Palpitations: Secondary | ICD-10-CM

## 2022-08-16 MED ORDER — DILTIAZEM HCL ER 180 MG PO TB24: 180.0000 mg | ORAL_TABLET | Freq: Every day | ORAL | 1 refills | Status: DC

## 2022-08-16 NOTE — Telephone Encounter (Signed)
Patient states she was seen last week. She was asked to call back and let us know how she was feeling. She states her HR is still up and she doesn't know where she needs to go from here.  CB# [phone]

## 2022-08-16 NOTE — Telephone Encounter (Signed)
Patient states the medication Dr. Dennard Schaumann was calling into [facility] is unavailable. She has tried both [facility] near her. She would like this called into [facility].  CB# [phone]

## 2022-08-17 ENCOUNTER — Other Ambulatory Visit: Payer: Self-pay

## 2022-08-17 ENCOUNTER — Emergency Department (HOSPITAL_BASED_OUTPATIENT_CLINIC_OR_DEPARTMENT_OTHER): Payer: Medicare Other

## 2022-08-17 ENCOUNTER — Encounter (HOSPITAL_BASED_OUTPATIENT_CLINIC_OR_DEPARTMENT_OTHER): Payer: Self-pay | Admitting: Emergency Medicine

## 2022-08-17 ENCOUNTER — Telehealth: Payer: Self-pay

## 2022-08-17 ENCOUNTER — Inpatient Hospital Stay (HOSPITAL_BASED_OUTPATIENT_CLINIC_OR_DEPARTMENT_OTHER)
Admission: EM | Admit: 2022-08-17 | Discharge: 2022-08-20 | DRG: 301 | Disposition: A | Discharge status: Home / Self Care | Payer: Medicare Other | Attending: Family Medicine | Admitting: Family Medicine

## 2022-08-17 DIAGNOSIS — Z8719 Personal history of other diseases of the digestive system: Secondary | ICD-10-CM | POA: Diagnosis not present

## 2022-08-17 DIAGNOSIS — I7121 Aneurysm of the ascending aorta, without rupture: Secondary | ICD-10-CM | POA: Diagnosis present

## 2022-08-17 DIAGNOSIS — I482 Chronic atrial fibrillation, unspecified: Secondary | ICD-10-CM | POA: Diagnosis not present

## 2022-08-17 DIAGNOSIS — Z86010 Personal history of colonic polyps: Secondary | ICD-10-CM | POA: Diagnosis not present

## 2022-08-17 DIAGNOSIS — Z8616 Personal history of COVID-19: Secondary | ICD-10-CM

## 2022-08-17 DIAGNOSIS — Z823 Family history of stroke: Secondary | ICD-10-CM

## 2022-08-17 DIAGNOSIS — Z8261 Family history of arthritis: Secondary | ICD-10-CM

## 2022-08-17 DIAGNOSIS — K219 Gastro-esophageal reflux disease without esophagitis: Secondary | ICD-10-CM | POA: Diagnosis present

## 2022-08-17 DIAGNOSIS — Z7983 Long term (current) use of bisphosphonates: Secondary | ICD-10-CM

## 2022-08-17 DIAGNOSIS — I71019 Dissection of thoracic aorta, unspecified: Secondary | ICD-10-CM | POA: Diagnosis present

## 2022-08-17 DIAGNOSIS — Z833 Family history of diabetes mellitus: Secondary | ICD-10-CM

## 2022-08-17 DIAGNOSIS — I7101 Dissection of ascending aorta: Principal | ICD-10-CM | POA: Diagnosis present

## 2022-08-17 DIAGNOSIS — M810 Age-related osteoporosis without current pathological fracture: Secondary | ICD-10-CM | POA: Diagnosis present

## 2022-08-17 DIAGNOSIS — Z85828 Personal history of other malignant neoplasm of skin: Secondary | ICD-10-CM | POA: Diagnosis not present

## 2022-08-17 DIAGNOSIS — R053 Chronic cough: Secondary | ICD-10-CM | POA: Diagnosis present

## 2022-08-17 DIAGNOSIS — I4891 Unspecified atrial fibrillation: Secondary | ICD-10-CM | POA: Diagnosis not present

## 2022-08-17 DIAGNOSIS — I712 Thoracic aortic aneurysm, without rupture, unspecified: Secondary | ICD-10-CM | POA: Diagnosis not present

## 2022-08-17 DIAGNOSIS — R0602 Shortness of breath: Secondary | ICD-10-CM | POA: Diagnosis not present

## 2022-08-17 DIAGNOSIS — I71011 Dissection of aortic arch: Secondary | ICD-10-CM | POA: Diagnosis not present

## 2022-08-17 DIAGNOSIS — R058 Other specified cough: Secondary | ICD-10-CM | POA: Diagnosis not present

## 2022-08-17 DIAGNOSIS — Z8249 Family history of ischemic heart disease and other diseases of the circulatory system: Secondary | ICD-10-CM | POA: Diagnosis not present

## 2022-08-17 DIAGNOSIS — R519 Headache, unspecified: Secondary | ICD-10-CM | POA: Diagnosis not present

## 2022-08-17 DIAGNOSIS — K6289 Other specified diseases of anus and rectum: Secondary | ICD-10-CM | POA: Diagnosis present

## 2022-08-17 DIAGNOSIS — Z7982 Long term (current) use of aspirin: Secondary | ICD-10-CM | POA: Diagnosis not present

## 2022-08-17 DIAGNOSIS — Z79899 Other long term (current) drug therapy: Secondary | ICD-10-CM | POA: Diagnosis not present

## 2022-08-17 DIAGNOSIS — Z818 Family history of other mental and behavioral disorders: Secondary | ICD-10-CM | POA: Diagnosis not present

## 2022-08-17 DIAGNOSIS — Z7901 Long term (current) use of anticoagulants: Secondary | ICD-10-CM

## 2022-08-17 DIAGNOSIS — I499 Cardiac arrhythmia, unspecified: Secondary | ICD-10-CM

## 2022-08-17 DIAGNOSIS — D649 Anemia, unspecified: Secondary | ICD-10-CM | POA: Diagnosis present

## 2022-08-17 DIAGNOSIS — I959 Hypotension, unspecified: Secondary | ICD-10-CM | POA: Diagnosis not present

## 2022-08-17 DIAGNOSIS — Z821 Family history of blindness and visual loss: Secondary | ICD-10-CM

## 2022-08-17 DIAGNOSIS — E785 Hyperlipidemia, unspecified: Secondary | ICD-10-CM | POA: Diagnosis present

## 2022-08-17 DIAGNOSIS — I7123 Aneurysm of the descending thoracic aorta, without rupture: Secondary | ICD-10-CM | POA: Diagnosis present

## 2022-08-17 DIAGNOSIS — I480 Paroxysmal atrial fibrillation: Secondary | ICD-10-CM | POA: Diagnosis present

## 2022-08-17 DIAGNOSIS — I700 Atherosclerosis of aorta: Secondary | ICD-10-CM | POA: Diagnosis not present

## 2022-08-17 DIAGNOSIS — I7100 Dissection of unspecified site of aorta: Secondary | ICD-10-CM | POA: Diagnosis present

## 2022-08-17 DIAGNOSIS — R002 Palpitations: Secondary | ICD-10-CM

## 2022-08-17 HISTORY — DX: Unspecified atrial fibrillation: I48.91

## 2022-08-17 LAB — BASIC METABOLIC PANEL
Anion gap: 11 (ref 5–15)
BUN: 21 mg/dL (ref 8–23)
CO2: 26 mmol/L (ref 22–32)
Calcium: 9.8 mg/dL (ref 8.9–10.3)
Chloride: 102 mmol/L (ref 98–111)
Creatinine, Ser: 0.64 mg/dL (ref 0.44–1.00)
GFR, Estimated: >60 mL/min (ref >60)
Glucose, Bld: 100 mg/dL — ABNORMAL HIGH (ref 70–99)
Potassium: 3.9 mmol/L (ref 3.5–5.1)
Sodium: 139 mmol/L (ref 135–145)

## 2022-08-17 LAB — CBC WITH DIFFERENTIAL/PLATELET
Abs Immature Granulocytes: 0.1 10*3/uL — ABNORMAL HIGH (ref 0.00–0.07)
Basophils Absolute: 0.1 10*3/uL (ref 0.0–0.1)
Basophils Relative: 0 %
Eosinophils Absolute: 0 10*3/uL (ref 0.0–0.5)
Eosinophils Relative: 0 %
HCT: 41 % (ref 36.0–46.0)
Hemoglobin: 13.7 g/dL (ref 12.0–15.0)
Immature Granulocytes: 1 %
Lymphocytes Relative: 9 %
Lymphs Abs: 1.7 10*3/uL (ref 0.7–4.0)
MCH: 29.7 pg (ref 26.0–34.0)
MCHC: 33.4 g/dL (ref 30.0–36.0)
MCV: 88.9 fL (ref 80.0–100.0)
Monocytes Absolute: 1.9 10*3/uL — ABNORMAL HIGH (ref 0.1–1.0)
Monocytes Relative: 10 %
Neutro Abs: 14.9 10*3/uL — ABNORMAL HIGH (ref 1.7–7.7)
Neutrophils Relative %: 80 %
Platelets: 345 10*3/uL (ref 150–400)
RBC: 4.61 MIL/uL (ref 3.87–5.11)
RDW: 15.3 % (ref 11.5–15.5)
WBC: 18.6 10*3/uL — ABNORMAL HIGH (ref 4.0–10.5)
nRBC: 0 % (ref 0.0–0.2)

## 2022-08-17 LAB — OCCULT BLOOD X 1 CARD TO LAB, STOOL: Fecal Occult Bld: NEGATIVE

## 2022-08-17 LAB — TROPONIN I (HIGH SENSITIVITY)
Troponin I (High Sensitivity): 7 ng/L (ref <18)
Troponin I (High Sensitivity): 7 ng/L (ref <18)
Troponin I (High Sensitivity): 12 ng/L (ref <18)
Troponin I (High Sensitivity): 12 ng/L (ref <18)

## 2022-08-17 LAB — BRAIN NATRIURETIC PEPTIDE: B Natriuretic Peptide: 382.1 pg/mL — ABNORMAL HIGH (ref 0.0–100.0)

## 2022-08-17 LAB — HEMOGLOBIN AND HEMATOCRIT, BLOOD
HCT: 35 % — ABNORMAL LOW (ref 36.0–46.0)
Hemoglobin: 11.5 g/dL — ABNORMAL LOW (ref 12.0–15.0)

## 2022-08-17 LAB — LACTIC ACID, PLASMA
Lactic Acid, Venous: 1.7 mmol/L (ref 0.5–1.9)
Lactic Acid, Venous: 1.6 mmol/L (ref 0.5–1.9)

## 2022-08-17 MED ORDER — IOHEXOL 350 MG/ML SOLN
100.0000 mL | Freq: Once | INTRAVENOUS | Status: AC | PRN
  Administered 2022-08-17: 60 mL via INTRAVENOUS

## 2022-08-17 MED ORDER — DILTIAZEM HCL ER 180 MG PO TB24: 180.0000 mg | ORAL_TABLET | Freq: Every day | ORAL | 1 refills | Status: DC

## 2022-08-17 MED ORDER — HEPARIN BOLUS VIA INFUSION
3500.0000 [IU] | Freq: Once | INTRAVENOUS | Status: AC
  Administered 2022-08-17: 3500 [IU] via INTRAVENOUS

## 2022-08-17 MED ORDER — DILTIAZEM HCL 25 MG/5ML IV SOLN
10.0000 mg | Freq: Once | INTRAVENOUS | Status: AC
  Administered 2022-08-17: 10 mg via INTRAVENOUS
  Filled 2022-08-17: qty 5

## 2022-08-17 MED ORDER — POLYETHYLENE GLYCOL 3350 17 G PO PACK: 17.0000 g | PACK | Freq: Every day | ORAL | Status: DC | PRN

## 2022-08-17 MED ORDER — ACETAMINOPHEN 325 MG PO TABS
650.0000 mg | ORAL_TABLET | Freq: Four times a day (QID) | ORAL | Status: DC | PRN
  Administered 2022-08-17 – 2022-08-18 (×2): 650 mg via ORAL
  Filled 2022-08-17 (×2): qty 2

## 2022-08-17 MED ORDER — HEPARIN (PORCINE) 25000 UT/250ML-% IV SOLN
800.0000 [IU]/h | INTRAVENOUS | Status: DC
  Administered 2022-08-17: 800 [IU]/h via INTRAVENOUS
  Filled 2022-08-17: qty 250

## 2022-08-17 MED ORDER — NICARDIPINE HCL IN NACL 20-0.86 MG/200ML-% IV SOLN
3.0000 mg/h | INTRAVENOUS | Status: DC
  Administered 2022-08-17: 7.5 mg/h via INTRAVENOUS
  Administered 2022-08-17: 5 mg/h via INTRAVENOUS
  Filled 2022-08-17 (×2): qty 200

## 2022-08-17 MED ORDER — ESMOLOL HCL-SODIUM CHLORIDE 2000 MG/100ML IV SOLN
25.0000 ug/kg/min | INTRAVENOUS | Status: DC
  Administered 2022-08-17: 25 ug/kg/min via INTRAVENOUS
  Administered 2022-08-17: 140 ug/kg/min via INTRAVENOUS
  Filled 2022-08-17 (×4): qty 100

## 2022-08-17 MED ORDER — SODIUM CHLORIDE 0.9 % IV SOLN
250.0000 mL | INTRAVENOUS | Status: DC
  Administered 2022-08-17: 250 mL via INTRAVENOUS

## 2022-08-17 MED ORDER — ESMOLOL BOLUS VIA INFUSION
500.0000 ug/kg | Freq: Once | INTRAVENOUS | Status: AC
  Administered 2022-08-17: 28800 ug via INTRAVENOUS
  Filled 2022-08-17: qty 29000

## 2022-08-17 MED ORDER — PHENYLEPHRINE HCL-NACL 20-0.9 MG/250ML-% IV SOLN
25.0000 ug/min | INTRAVENOUS | Status: DC
  Administered 2022-08-17: 25 ug/min via INTRAVENOUS
  Administered 2022-08-18: 65 ug/min via INTRAVENOUS
  Filled 2022-08-17 (×2): qty 250

## 2022-08-17 MED ORDER — DOCUSATE SODIUM 100 MG PO CAPS: 100.0000 mg | ORAL_CAPSULE | Freq: Two times a day (BID) | ORAL | Status: DC | PRN

## 2022-08-17 MED ORDER — ONDANSETRON HCL 4 MG/2ML IJ SOLN: 4.0000 mg | Freq: Four times a day (QID) | INTRAMUSCULAR | Status: DC | PRN

## 2022-08-17 MED ORDER — DILTIAZEM HCL 60 MG PO TABS
90.0000 mg | ORAL_TABLET | Freq: Four times a day (QID) | ORAL | Status: DC
  Administered 2022-08-17: 90 mg via ORAL
  Filled 2022-08-17: qty 1

## 2022-08-17 NOTE — Progress Notes (Signed)
VASCULAR SURGERY:  I was asked to review her CT angio by the emergency room physician at [place].  I would agree with radiology and cardiac surgery that she appears to have a chronic dissection of the descending thoracic aorta versus a thoracic aortic aneurysm with significant laminated thrombus.  Regardless, this does not appear to be acute.  I would be happy to evaluate the patient when she is transferred to [facility].  Gae Gallop, MD 3:56 PM

## 2022-08-17 NOTE — ED Provider Notes (Signed)
[facility] Provider Note   CSN: [id number] Arrival date & time: 08/17/22  1058     History  Chief Complaint  Patient presents with   Tachycardia    Terri Alexander is a 72 y.o. female.  HPI Patient presents with shortness of breath and pleuritic pain with palpitations.  She has known A-fib which is being treated by her PCP.  She does endorse that she thought she was supposed to stop taking Eliquis (although she never started it) when her metoprolol dosage was increased.  She has been unable to find a supply of Cardizem which was prescribed recently as well.  Denies chest pain or leg pain.  She also endorses she has continued taking 20 mg Lasix for her lower extremity edema.  Further history provided after scan is patient endorsed low back pain for the last 2 to 3 days and some blood in her stool last night.    Home Medications Prior to Admission medications   Medication Sig Start Date End Date Taking? Authorizing Provider  alendronate (FOSAMAX) 70 MG tablet TAKE 1 TABLET BY MOUTH ONCE A WEEK ON AN EMPTY STOMACH WITH A FULL GLASS OF WATER 06/28/22   Susy Frizzle, MD  aspirin 81 MG tablet Take 81 mg by mouth daily.    [provider]  Biotin 1000 MCG CHEW Chew by mouth.    [provider]  bisacodyl (BISACODYL) 5 MG EC tablet Take 5 mg by mouth daily as needed for moderate constipation. Dulcolax 5 mg tab take as directed for colonoscopy prep.    [provider]  Calcium Carb-Cholecalciferol (CALCIUM 600 + D PO) Take 600 mg by mouth once.    [provider]  chlorpheniramine (CHLOR-TRIMETON) 4 MG tablet Take 4 mg by mouth 2 (two) times daily as needed for allergies.    [provider]  Cholecalciferol (VITAMIN D) 50 MCG (2000 UT) CAPS Take by mouth.    [provider]  co-enzyme Q-10 30 MG capsule Take 30 mg by mouth 3 (three) times daily.    [provider]   Cranberry-Cholecalciferol 4200-500 MG-UNIT CAPS Take by mouth.    [provider]  diltiazem (CARDIZEM LA) 180 MG 24 hr tablet Take 1 tablet (180 mg total) by mouth daily. 08/17/22   Susy Frizzle, MD  docusate sodium (COLACE) 250 MG capsule Take 250 mg by mouth daily.    [provider]  fluticasone (FLONASE) 50 MCG/ACT nasal spray Place 2 sprays into both nostrils daily.    [provider]  furosemide (LASIX) 40 MG tablet Take 1 tablet (40 mg total) by mouth daily. 08/09/22   Susy Frizzle, MD  LYSINE PO Take by mouth.    [provider]  Methylcellulose, Laxative, (FIBER THERAPY PO) Take by mouth.    [provider]  metoprolol succinate (TOPROL-XL) 50 MG 24 hr tablet Take 1 tablet (50 mg total) by mouth daily. Take with or immediately following a meal. 08/09/22   Susy Frizzle, MD  Multiple Vitamin (MULTIVITAMIN) tablet Take 1 tablet by mouth daily.    [provider]  Probiotic Product (PROBIOTIC PO) Take by mouth.    [provider]  vitamin C (ASCORBIC ACID) 500 MG tablet Take 500 mg by mouth daily.    [provider]      Allergies    Patient has no known allergies.    Review of Systems   Review of Systems  All other systems reviewed and are negative.  Physical Exam Updated Vital Signs BP 118/82   Pulse (!) 149   Temp 98.3 F (36.8 C)   Resp 20   Ht 5\' 2"  (1.575 m)   Wt 57.6 kg   LMP  (LMP Unknown)   SpO2 97%   BMI 23.23 kg/m  General: Well-appearing, NAD CV: Irregularly irregular, tachycardic, 2+ radial pulse Pulm: CTAB, normal WOB Abdomen: Soft, nontender, normoactive bowel sounds Extremities: Trace to 1+ pitting edema of bilateral lower extremities  ED Results / Procedures / Treatments   Labs (all labs ordered are listed, but only abnormal results are displayed) Labs Reviewed  BASIC METABOLIC PANEL - Abnormal; Notable for the following components:      Result Value   Glucose, Bld  100 (*)    All other components within normal limits  CBC WITH DIFFERENTIAL/PLATELET - Abnormal; Notable for the following components:   WBC 18.6 (*)    Neutro Abs 14.9 (*)    Monocytes Absolute 1.9 (*)    Abs Immature Granulocytes 0.10 (*)    All other components within normal limits  BRAIN NATRIURETIC PEPTIDE - Abnormal; Notable for the following components:   B Natriuretic Peptide 382.1 (*)    All other components within normal limits  OCCULT BLOOD X 1 CARD TO LAB, STOOL  TROPONIN I (HIGH SENSITIVITY)  TROPONIN I (HIGH SENSITIVITY)    EKG EKG Interpretation  Date/Time:  Tuesday August 17 2022 11:10:56 EDT Ventricular Rate:  142 PR Interval:    QRS Duration: 89 QT Interval:  277 QTC Calculation: 426 R Axis:   68 Text Interpretation: Atrial fibrillation with RVR ST depression, probably rate related Confirmed by Regan Lemming ([id number]) on 08/17/2022 11:12:26 AM  Radiology CT Angio Chest/Abd/Pel for Dissection W and/or Wo Contrast  Result Date: 08/17/2022 CLINICAL DATA:  Shortness of breath and one-week history of tachycardia associated with headache EXAM: CT ANGIOGRAPHY CHEST, ABDOMEN AND PELVIS TECHNIQUE: Non-contrast CT of the chest was initially obtained. Multidetector CT imaging through the chest, abdomen and pelvis was performed using the standard protocol during bolus administration of intravenous contrast. Multiplanar reconstructed images and MIPs were obtained and reviewed to evaluate the vascular anatomy. RADIATION DOSE REDUCTION: This exam was performed according to the departmental dose-optimization program which includes automated exposure control, adjustment of the mA and/or kV according to patient size and/or use of iterative reconstruction technique. CONTRAST:  6mL OMNIPAQUE IOHEXOL 350 MG/ML SOLN COMPARISON:  CT abdomen and pelvis dated 12/18/2020 FINDINGS: CTA CHEST FINDINGS Cardiovascular: Preferential opacification of the thoracic aorta. Ascending aorta measures 4.2 x 4.0  cm. The proximal descending aorta measures 5.6 x 4.8 cm. Irregular hypoattenuating noncalcified thrombus is seen extending from the level of the distal arch immediately distal to the left subclavian artery origin to the level of the diaphragmatic hiatus. At the proximal aspect, there is slight displacement of the intimal calcification, suggestive of dissection (8:14) with thrombosis of the false lumen, extending to the level T5-6. Normal heart size. No pericardial effusion. Mediastinum/Nodes: Imaged thyroid gland without nodules meeting criteria for imaging follow-up by size. Small hiatal hernia. No pathologically enlarged axillary, supraclavicular, mediastinal, or hilar lymph nodes. Lungs/Pleura: The central airways are patent. Bilateral lower lobe relaxation atelectasis. 3 mm ground-glass nodule along the right minor fissure (7:66). No pneumothorax. Trace right and small left pleural effusions. Musculoskeletal: No acute or abnormal lytic or blastic osseous lesions. Review of the MIP images confirms the above findings. CTA ABDOMEN AND PELVIS FINDINGS VASCULAR Aorta:  Normal caliber aorta without aneurysm, dissection, vasculitis or significant stenosis. Celiac: Patent without evidence of aneurysm, dissection, vasculitis or significant stenosis. SMA: Patent without evidence of aneurysm, dissection, vasculitis or significant stenosis. Renals: Single right and 2 left renal arteries. The left renal arteries arise from the distal abdominal aorta, inferior to the IMA takeoff and from the proximal right common iliac artery. The renal arteries are patent without evidence of aneurysm, dissection, vasculitis, fibromuscular dysplasia or significant stenosis. IMA: Patent without evidence of aneurysm, dissection, vasculitis or significant stenosis. Inflow: Patent without evidence of aneurysm, dissection, vasculitis or significant stenosis. Proximal Outflow: Bilateral common femoral and visualized portions of the superficial and  profunda femoral arteries are patent without evidence of aneurysm, dissection, vasculitis or significant stenosis. Veins: No obvious venous abnormality within the limitations of this arterial phase study. Review of the MIP images confirms the above findings. NON-VASCULAR Hepatobiliary: No focal hepatic lesions. No intra or extrahepatic biliary ductal dilation. Normal gallbladder. Pancreas: No focal lesions or main ductal dilation. Spleen: Normal in size without focal abnormality. Adrenals/Urinary Tract: No adrenal nodules. Pelvic left kidney. No suspicious renal mass, calculi or hydronephrosis. Bilateral simple cysts. No specific follow-up imaging recommended. no focal bladder wall thickening. Stomach/Bowel: Normal appearance of the stomach. Mural thickening of the rectum. Sigmoid diverticulosis. Appendix is not discretely seen. Lymphatic: No enlarged abdominal or pelvic lymph nodes. Reproductive: No adnexal masses. Other: Hyperattenuating small volume pelvic free fluid. No free air. Musculoskeletal: No acute or abnormal lytic or blastic osseous lesions. Review of the MIP images confirms the above findings. IMPRESSION: VASCULAR: 1. 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 Recommend continued management and follow-up per vascular surgery. NONVASCULAR: 1. Trace right and small left pleural effusions. 2. Nonspecific 3 mm ground-glass nodule along the right minor fissure. No follow-up needed if patient is low-risk.This recommendation follows the consensus statement: Guidelines for Management of Incidental Pulmonary Nodules Detected on CT Images: From the Fleischner Society 2017; Radiology 2017; 284:228-243. 3. Small volume hyperattenuating pelvic free fluid, suspicious for hemorrhage adjacent to mural thickening of the rectum, which may represent proctitis. 4.  Appendix is not discretely seen. 5. Pelvic left kidney. These results were called by telephone at the time of interpretation on 08/17/2022 at 2:10 pm to provider [facility] , who verbally acknowledged these results. Electronically Signed   By: Darrin Nipper M.D.   On: 08/17/2022 14:43   CT Angio Chest PE W/Cm &/Or Wo Cm  Result Date: 08/17/2022 CLINICAL DATA:  Headache, AFib, concern for pulmonary embolism. EXAM: CT ANGIOGRAPHY CHEST WITH CONTRAST TECHNIQUE: Multidetector CT imaging of the chest was performed using the standard protocol during bolus administration of intravenous contrast. Multiplanar CT image reconstructions and MIPs were obtained to evaluate the vascular anatomy. RADIATION DOSE REDUCTION: This exam was performed according to the departmental dose-optimization program which includes automated exposure control, adjustment of the mA and/or kV according to patient size and/or use of iterative reconstruction technique. CONTRAST:  2mL OMNIPAQUE IOHEXOL 350 MG/ML SOLN COMPARISON:  No direct comparison study available. CT abdomen/pelvis 12/18/2020 FINDINGS: Cardiovascular: There is adequate opacification of the pulmonary arteries to the segmental level. There is no evidence of pulmonary embolism. The ascending thoracic aorta is aneurysmal measuring up to 4.1 cm. The arch measures up to 4.8 cm in the sagittal plane (8-95). There is crescentic hypodensity along the posterior wall of the aortic  arch and descending thoracic aorta which may reflect dissection/intramural hematoma or noncalcified plaque. Curvilinear hyperdense material within this hypodensity is consistent with calcification. The thoracic aorta remains patent though is suboptimally opacified. The heart size is normal. There is no pericardial effusion. There is reflux of contrast into the IVC. Mediastinum/Nodes: The imaged thyroid is unremarkable. The esophagus is grossly unremarkable. There is a small hiatal hernia. There is no mediastinal,  axillary, or hilar lymphadenopathy. Lungs/Pleura: The trachea and central airways are patent. There is a small right posterolateral diverticulum in the upper trachea (6-16). There are small bilateral pleural effusions with adjacent atelectasis. There is no other focal airspace opacity. There is no pulmonary edema. There is no pneumothorax There are no suspicious nodules. Upper Abdomen: The imaged portions of the upper abdominal viscera are unremarkable. Musculoskeletal: There is no acute osseous abnormality or suspicious osseous lesion. Review of the MIP images confirms the above findings. IMPRESSION: 1. Aneurysmal ascending thoracic aorta and aortic arch measuring up to 4.8 cm at the arch with crescentic hypodensity along the posterior wall throughout the thoracic aorta beginning at the arch just beyond the origin of the brachiocephalic artery extending to the lower thoracic aorta which may reflect dissection/acute intramural hematoma or noncalcified plaque. Recommend vascular surgery consultation due to size and dissection protocol CTA of the chest. 2. No evidence of pulmonary embolism. 3. Reflux of contrast into the IVC suggestive of right heart dysfunction. 4. Small bilateral pleural effusions. These results were called by telephone at the time of interpretation on 08/17/2022 at 1:35 pm to provider [facility] , who verbally acknowledged these results. Electronically Signed   By: Valetta Mole M.D.   On: 08/17/2022 13:41    Procedures Procedures    Medications Ordered in ED Medications  esmolol (BREVIBLOC) 2000 mg / 100 mL (20 mg/mL) infusion (50 mcg/kg/min  57.6 kg Intravenous Rate/Dose Change 08/17/22 1443)  nicardipine (CARDENE) 20mg  in 0.86% saline 229ml IV infusion (0.1 mg/ml) (has no administration in time range)  diltiazem (CARDIZEM) injection 10 mg (10 mg Intravenous Given 08/17/22 1226)  heparin bolus via infusion 3,500 Units (3,500 Units Intravenous Bolus from Bag 08/17/22 1236)  iohexol  (OMNIPAQUE) 350 MG/ML injection 100 mL (60 mLs Intravenous Contrast Given 08/17/22 1314)  iohexol (OMNIPAQUE) 350 MG/ML injection 100 mL (60 mLs Intravenous Contrast Given 08/17/22 1343)  esmolol (BREVIBLOC) bolus via infusion 28,800 mcg (28,800 mcg Intravenous Bolus from Bag 08/17/22 1429)    ED Course/ Medical Decision Making/ A&P                             Medical Decision Making Amount and/or Complexity of Data Reviewed Labs: ordered. Radiology: ordered.  Risk Prescription drug management. Decision regarding hospitalization.  72 year old female with known A-fib presenting with palpitations and shortness of breath and pleuritic pain.  EKG reveals A-fib with RVR. Upon chart review, it appears A-fib has been difficult to control with recent changes of metoprolol 100 mg twice daily and recent addition of diltiazem which she has been unable to pick up.  Unfortunately, patient stopped taking Eliquis (or never started it) and had COVID 3 weeks ago which prompts additional concern for pulmonary embolism. Considered pneumonia, new onset heart failure, aortic dissection.  CT PE revealed aneurysmal ascending thoracic aorta and aortic arch 4.8 cm at the arch extending to the lower thoracic aorta concerning for dissection/acute intramural hematoma.  CVTS suspected chronic aneurysm.  Vascular surgery looked into case as well.  CTA dissection study performed which revealed thoracic aortic aneurysm 5.6 cm suspected thrombosed chronic type B aortic dissection with recommendations for follow-up at vascular surgery.  Additionally revealed small volume hyperattenuating pelvic free fluid suspicious for hemorrhage adjacent to mural thickening of the rectum Patient was initiated on esmolol and Cardene IV for heart rate and BP control.  Discussed with ICU Dr. Tacy Learn who is excepted for admission.  She will be managed for A-fib with RVR and aortic dissection.         Final Clinical Impression(s) / ED  Diagnoses Final diagnoses:  Atrial fibrillation with RVR  SOB (shortness of breath)    Rx / DC Orders ED Discharge Orders     None         Wells Guiles, DO 08/17/22 1459    Regan Lemming, MD 08/17/22 1641    Regan Lemming, MD 08/17/22 1931    Regan Lemming, MD 08/17/22 1934

## 2022-08-17 NOTE — ED Triage Notes (Signed)
Pt arrives pov, to triage in wheelchair, reports feeling shob, and reports HA. Endorses concern for afib x 1 week. Recent change in medication

## 2022-08-17 NOTE — Progress Notes (Signed)
Called to bedside for hypotension and worsening pain.  Cardioverted around shift change and BP has been drifting down since.  Pain is similar to admit but now accompanied by dizziness and some SOB.  Nontoxic appearing bedside echo with normal function, pulses equal x 4.  - Check CBC, lacate, trop 500cc NS bolus  Hopefully this is just meds catching up with her, if persistent need to consider repeat scan.  Erskine Emery MD PCCM

## 2022-08-17 NOTE — Consult Note (Signed)
ASSESSMENT & PLAN   THORACIC AORTIC ANEURYSM VERSUS CHRONIC TYPE B AORTIC DISSECTION: The patient has significant laminated thrombus within a thoracic aortic aneurysm.  The maximum diameter of the aneurysm of the descending thoracic aorta is 5.6 cm.  There is enlargement of the ascending aorta which is 4.2 cm in maximum diameter.  Alternatively, this could represent a chronic type B aortic dissection with a thrombosed lumen.  Regardless, this does not appear to be acute.  I think she will simply need a follow-up CT angio of the chest in 6 months.  I will arrange this follow-up study.  REASON FOR CONSULT:    Possible chronic type B aortic dissection.  Consult is requested by Dr. Tacy Learn.  HPI:   Terri Alexander is a 72 y.o. female who presented to the emergency department today with shortness of breath and some pleuritic type chest pain.  She was noted to be in A-fib with a rapid ventricular response.  She underwent a CT angiogram of the chest which showed a possible chronic type B aortic dissection versus a thoracic aortic aneurysm with laminated thrombus.  For this reason vascular surgery was consulted.  On my history, the patient developed COVID about a month ago.  She recovered from this but she has been having intermittent episodes of shortness of breath over the last month.  This is gotten worse especially today.  She denies any sudden onset severe chest pain or back pain.  She is unaware of any previous history of aneurysmal disease or aortic dissection.  There is no family history of aneurysmal disease.  She denies any history of hypertension.  She is not a smoker.  Past Medical History:  Diagnosis Date   Adenomatous colon polyp    Allergy    Anemia    as a child, no current problem   Atrial fibrillation    Blood transfusion without reported diagnosis    63 months old   Cataract    bilateral - MD just watching    GERD (gastroesophageal reflux disease)    Hyperlipidemia     Osteoporosis 04/15/2016   Skin cancer 2013   Skin cancer- Nose, face    Family History  Problem Relation Age of Onset   Arthritis Mother    Heart disease Mother    Vision loss Mother    Stroke Mother    Depression Sister    Transient ischemic attack Sister 33   Depression Sister    Cancer Maternal Grandmother    Cancer Maternal Grandfather    Early death Maternal Grandfather    Heart disease Maternal Grandfather    Early death Paternal Grandfather    Heart disease Paternal Grandfather    Diabetes Other        Juvenile   Colon cancer Neg Hx    Rectal cancer Neg Hx    Prostate cancer Neg Hx     SOCIAL HISTORY: Social History   Tobacco Use   Smoking status: Never   Smokeless tobacco: Never  Substance Use Topics   Alcohol use: Yes    Alcohol/week: 1.0 standard drink of alcohol    Types: 1 Glasses of wine per week    Comment: rarely    No Known Allergies  Current Facility-Administered Medications  Medication Dose Route Frequency Provider Last Rate Last Admin   diltiazem (CARDIZEM) tablet 90 mg  90 mg Oral Q6H Chand, Currie Paris, MD   90 mg at 08/17/22 1802   docusate sodium (COLACE) capsule 100 mg  100 mg Oral BID PRN Jacky Kindle, MD       esmolol (BREVIBLOC) 2000 mg / 100 mL (20 mg/mL) infusion  25-300 mcg/kg/min Intravenous Continuous Regan Lemming, MD 20.7 mL/hr at 08/17/22 1710 120 mcg/kg/min at 08/17/22 1710   nicardipine (CARDENE) 20mg  in 0.86% saline 231ml IV infusion (0.1 mg/ml)  3-15 mg/hr Intravenous Continuous Regan Lemming, MD 50 mL/hr at 08/17/22 1710 5 mg/hr at 08/17/22 1710   ondansetron (ZOFRAN) injection 4 mg  4 mg Intravenous Q6H PRN Jacky Kindle, MD       polyethylene glycol (MIRALAX / GLYCOLAX) packet 17 g  17 g Oral Daily PRN Jacky Kindle, MD        REVIEW OF SYSTEMS:  [X]  denotes positive finding, [ ]  denotes negative finding Cardiac  Comments:  Chest pain or chest pressure: x   Shortness of breath upon exertion: x   Short of breath when  lying flat:    Irregular heart rhythm:        Vascular    Pain in calf, thigh, or hip brought on by ambulation:    Pain in feet at night that wakes you up from your sleep:     Blood clot in your veins:    Leg swelling:         Pulmonary    Oxygen at home:    Productive cough:     Wheezing:         Neurologic    Sudden weakness in arms or legs:     Sudden numbness in arms or legs:     Sudden onset of difficulty speaking or slurred speech:    Temporary loss of vision in one eye:     Problems with dizziness:         Gastrointestinal    Blood in stool:     Vomited blood:         Genitourinary    Burning when urinating:     Blood in urine:        Psychiatric    Major depression:         Hematologic    Bleeding problems:    Problems with blood clotting too easily:        Skin    Rashes or ulcers:        Constitutional    Fever or chills:    -  PHYSICAL EXAM:   Vitals:   08/17/22 1645 08/17/22 1650 08/17/22 1655 08/17/22 1700  BP: (!) 82/51 96/76    Pulse: (!) 139 (!) 131 (!) 122 (!) 131  Resp: (!) 22 (!) 22 18 19   Temp:      TempSrc:    Oral  SpO2: 92% 93% 97% 90%  Weight:      Height:       Body mass index is 23.23 kg/m. GENERAL: The patient is a well-nourished female, in no acute distress. The vital signs are documented above. CARDIAC: There is a regular rate and rhythm.  VASCULAR: I do not detect carotid bruits. She has palpable femoral, dorsalis pedis, and posterior tibial pulses bilaterally. She has no evidence of atheroembolic disease to her feet. PULMONARY: There is good air exchange bilaterally without wheezing or rales. ABDOMEN: Soft and non-tender with normal pitched bowel sounds.  I do not palpate an abdominal aortic aneurysm. MUSCULOSKELETAL: There are no major deformities. NEUROLOGIC: No focal weakness or paresthesias are detected. SKIN: There are no ulcers or rashes noted. PSYCHIATRIC: The patient has a normal affect.  DATA:    CT ANGIO  CHEST ABDOMEN PELVIS: I have reviewed the images of her CT angio of the chest abdomen pelvis.  The maximum diameter of the ascending aorta is 4.2 cm.  The maximum diameter of the descending thoracic aorta is 5.6 cm.  There is significant laminated thrombus extending from the the left subclavian artery down to the level of T5-T6.  This could represent laminated thrombus or could potentially represent a chronic type B aortic dissection with a thrombosed lumen.  There is no evidence of abdominal aortic aneurysm.  Deitra Mayo [facility]

## 2022-08-17 NOTE — ED Notes (Signed)
Pt states that she feels better than on arrival to ED. Awaiting transfer to [facility]. Husband at bedside. VSS. Rates lower back pain 2/10.

## 2022-08-17 NOTE — Progress Notes (Signed)
   PCCM transfer request    Sending physician:James Lawsing  Sending facility: [facility]  Reason for transfer: Type B aortic dissection  Brief case summary: 72 year old female with recent COVID history presented with A-fib with RVR, CT chest showing type B aortic dissection with intramural hematoma  Recommendations made prior to transfer: Start esmolol and Cardene with heart rate goal less than 80 and SBP goal less than 120  Transfer accepted: yes    Jacky Kindle 08/17/22 2:21 PM [facility]  For contact information, see Amion. If no response to pager, please call PCCM consult pager. After hours, 7PM- 7AM, please call Elink.

## 2022-08-17 NOTE — ED Notes (Signed)
Spoke with Maudie Mercury at [facility] for consult

## 2022-08-17 NOTE — ED Notes (Addendum)
Esmolol bolus stopped per verbal order of Dr. Armandina Gemma.  Esmolol Drip increased to 9mcg/kg/min

## 2022-08-17 NOTE — ED Notes (Addendum)
Purewick in place. Pt repositioned in bed for comfort, which allowed her to urinate approx 137ml of clear yellow urine.

## 2022-08-17 NOTE — Progress Notes (Signed)
[facility] Progress Note Patient Name: Terri Alexander DOB: Oct 29, 1950 MRN: [id number]   Date of Service  08/17/2022  HPI/Events of Note  Back Pain - Nursing request for Tylenol. Last AST and ALT both normal.   eICU Interventions  Plan: Tylenol 650 mg PO Q 6 hours PRN pain or headache.      Intervention Category Major Interventions: Other:  Lysle Dingwall 08/17/2022, 7:56 PM

## 2022-08-17 NOTE — Progress Notes (Signed)
[name] Patient Name: Terri Alexander DOB: 17-Mar-1951 MRN: [id number]   Date of Service  08/17/2022  HPI/Events of Note  Anemia - Hgb = 13.7 --> 11.5.   eICU Interventions  Continue present management.      Intervention Category Major Interventions: Other:  Lysle Dingwall 08/17/2022, 9:41 PM

## 2022-08-17 NOTE — ED Notes (Signed)
Pt placed on 2L of O2, per Dr. Armandina Gemma.

## 2022-08-17 NOTE — H&P (Signed)
NAME:  Terri Alexander, MRN:  [id number], DOB:  May 01, 1951, LOS: 0 ADMISSION DATE:  08/17/2022, CONSULTATION DATE:  08/17/22 REFERRING MD:  Armandina Gemma, CHIEF COMPLAINT:  shortness of breath  History of Present Illness:  Terri Alexander is a 71 y.o. F with PMH significant for atrial fibrillation, HL, GERD and recent Covid (1 month ago) who presented to [facility] ED with the acute onset of pleuritic chest pain and shortness of breath.   She was recently diagnosed with atrial fibrillation and started on toprol XL and was prescribed Eliquis, though did not start this and has been out of cardizem.   She presented to the tachycardic, but otherwise hemodynamically stable.  Labs significant for WBC 18k, BNP 382, troponin WNL,  A CTA of the chest/abdomen and pelvis was obtained showing thoracic aortic aneurysm 5.6cm with suspected thrombosed chronic type B dissection extending from the distal arch to the level of T5-6.  She was started on a cardene drip and given IV cardizem and transferred to [facility] for vascular surgery evaluation and admission.  She incidentally notes a small amount of red blood in her stool the last few days with possible proctitis seen on CT  Pertinent  Medical History   has a past medical history of Adenomatous colon polyp, Allergy, Anemia, Atrial fibrillation, Blood transfusion without reported diagnosis, Cataract, GERD (gastroesophageal reflux disease), Hyperlipidemia, Osteoporosis (04/15/2016), and Skin cancer (2013).   Significant Hospital Events: Including procedures, antibiotic start and stop dates in addition to other pertinent events   4/2 presented to [facility], found to have type B dissection, started on cardene gtt and transferred to [facility]  Interim History / Subjective:  Pt arrived to [place] hemodynamically stable, no chest pain or shortness of breath.  Her back is hurting secondary to laying flat   Objective   Blood pressure 118/82, pulse (!) 149, temperature 98.3 F (36.8  C), resp. rate 20, height 5\' 2"  (1.575 m), weight 57.6 kg, SpO2 97 %.        Intake/Output Summary (Last 24 hours) at 08/17/2022 1452 Last data filed at 08/17/2022 1344 Gross per 24 hour  Intake 39.35 ml  Output --  Net 39.35 ml   Filed Weights   08/17/22 1109  Weight: 57.6 kg   General:  thin, elderly F in no acute distress  HEENT: MM pink/moist, sclera anicteric  Neuro: awake, alert and oriented  CV: s1s2 rrr, no m/r/g PULM:  clear bilaterally without tachypnea or accessory muscle use on 2L [place]  GI: soft, non-tender  Extremities: warm/dry, no edema  Skin: no rashes or lesions  Resolved Hospital Problem list     Assessment & Plan:   Thoracic aortic aneurysm with peripheral thrombosis and chronic type B aortic dissection Atrial Fibrillation -vascular surgery consult -Currently stable on cardene drip, maintain SBP <120 and HR <80 -resume po Cardizem -consider echo -never started eliquis, continue to hold along with Asa  Mild LGIB Possible Proctitis  CT abd/pelvis with small volume hyperattenuating pelvic free fluid, suspicious for hemorrhage adjacent to mural thickening of the rectum, which may represent proctitis -Hgb 13.7, WBC 18k -consider flagyl  Best Practice (right click and "Reselect all SmartList Selections" daily)   Diet/type: NPO DVT prophylaxis: SCD GI prophylaxis: N/A Lines: N/A Foley:  N/A Code Status:  full code Last date of multidisciplinary goals of care discussion [pending]  Labs   CBC: Recent Labs  Lab 08/17/22 1229  WBC 18.6*  NEUTROABS 14.9*  HGB 13.7  HCT 41.0  MCV  88.9  PLT 123456    Basic Metabolic Panel: Recent Labs  Lab 08/17/22 1229  NA 139  K 3.9  CL 102  CO2 26  GLUCOSE 100*  BUN 21  CREATININE 0.64  CALCIUM 9.8   GFR: Estimated Creatinine Clearance: 50.3 mL/min (by C-G formula based on SCr of 0.64 mg/dL). Recent Labs  Lab 08/17/22 1229  WBC 18.6*    Liver Function Tests: No results for input(s): "AST",  "ALT", "ALKPHOS", "BILITOT", "PROT", "ALBUMIN" in the last 168 hours. No results for input(s): "LIPASE", "AMYLASE" in the last 168 hours. No results for input(s): "AMMONIA" in the last 168 hours.  ABG No results found for: "PHART", "PCO2ART", "PO2ART", "HCO3", "TCO2", "ACIDBASEDEF", "O2SAT"   Coagulation Profile: No results for input(s): "INR", "PROTIME" in the last 168 hours.  Cardiac Enzymes: No results for input(s): "CKTOTAL", "CKMB", "CKMBINDEX", "TROPONINI" in the last 168 hours.  HbA1C: Hgb A1c MFr Bld  Date/Time Value Ref Range Status  12/18/2020 08:27 AM 5.6 <5.7 % of total Hgb Final    Comment:    For the purpose of screening for the presence of diabetes: . <5.7%       Consistent with the absence of diabetes 5.7-6.4%    Consistent with increased risk for diabetes             (prediabetes) > or =6.5%  Consistent with diabetes . This assay result is consistent with a decreased risk of diabetes. . Currently, no consensus exists regarding use of hemoglobin A1c for diagnosis of diabetes in children. . According to American Diabetes Association (ADA) guidelines, hemoglobin A1c <7.0% represents optimal control in non-pregnant diabetic patients. Different metrics may apply to specific patient populations.  Standards of Medical Care in Diabetes(ADA). .     CBG: No results for input(s): "GLUCAP" in the last 168 hours.  Review of Systems:   Please see the history of present illness. All other systems reviewed and are negative    Past Medical History:  She,  has a past medical history of Adenomatous colon polyp, Allergy, Anemia, Atrial fibrillation, Blood transfusion without reported diagnosis, Cataract, GERD (gastroesophageal reflux disease), Hyperlipidemia, Osteoporosis (04/15/2016), and Skin cancer (2013).   Surgical History:   Past Surgical History:  Procedure Laterality Date   CESAREAN SECTION  1981   x 1   COLONOSCOPY  2016   hx polyp, tics, sigmoid colon  mod tortuous Dr Boyd Kerbs,    EYE SURGERY Bilateral    retina repair x 2   LAPAROTOMY  1979   removal of ovarian cyst   nissan fundiplication  [id number]   REPAIR OF COMPLEX TRACTION RETINAL DETACHMENT Bilateral 01/2019     Social History:   reports that she has never smoked. She has never used smokeless tobacco. She reports current alcohol use of about 1.0 standard drink of alcohol per week. She reports that she does not use drugs.   Family History:  Her family history includes Arthritis in her mother; Cancer in her maternal grandfather and maternal grandmother; Depression in her sister and sister; Diabetes in an other family member; Early death in her maternal grandfather and paternal grandfather; Heart disease in her maternal grandfather, mother, and paternal grandfather; Stroke in her mother; Transient ischemic attack (age of onset: 61) in her sister; Vision loss in her mother. There is no history of Colon cancer, Rectal cancer, or Prostate cancer.   Allergies No Known Allergies   Home Medications  Prior to Admission medications   Medication Sig Start Date End  Date Taking? Authorizing Provider  alendronate (FOSAMAX) 70 MG tablet TAKE 1 TABLET BY MOUTH ONCE A WEEK ON AN EMPTY STOMACH WITH A FULL GLASS OF WATER 06/28/22   Susy Frizzle, MD  aspirin 81 MG tablet Take 81 mg by mouth daily.    [provider]  Biotin 1000 MCG CHEW Chew by mouth.    [provider]  bisacodyl (BISACODYL) 5 MG EC tablet Take 5 mg by mouth daily as needed for moderate constipation. Dulcolax 5 mg tab take as directed for colonoscopy prep.    [provider]  Calcium Carb-Cholecalciferol (CALCIUM 600 + D PO) Take 600 mg by mouth once.    [provider]  chlorpheniramine (CHLOR-TRIMETON) 4 MG tablet Take 4 mg by mouth 2 (two) times daily as needed for allergies.    [provider]  Cholecalciferol (VITAMIN D) 50 MCG (2000 UT) CAPS Take by mouth.    [provider]  co-enzyme Q-10 30 MG capsule Take 30 mg by mouth 3 (three) times daily.    [provider]  Cranberry-Cholecalciferol 4200-500 MG-UNIT CAPS Take by mouth.    [provider]  diltiazem (CARDIZEM LA) 180 MG 24 hr tablet Take 1 tablet (180 mg total) by mouth daily. 08/17/22   Susy Frizzle, MD  docusate sodium (COLACE) 250 MG capsule Take 250 mg by mouth daily.    [provider]  fluticasone (FLONASE) 50 MCG/ACT nasal spray Place 2 sprays into both nostrils daily.    [provider]  furosemide (LASIX) 40 MG tablet Take 1 tablet (40 mg total) by mouth daily. 08/09/22   Susy Frizzle, MD  LYSINE PO Take by mouth.    [provider]  Methylcellulose, Laxative, (FIBER THERAPY PO) Take by mouth.    [provider]  metoprolol succinate (TOPROL-XL) 50 MG 24 hr tablet Take 1 tablet (50 mg total) by mouth daily. Take with or immediately following a meal. 08/09/22   Susy Frizzle, MD  Multiple Vitamin (MULTIVITAMIN) tablet Take 1 tablet by mouth daily.    [provider]  Probiotic Product (PROBIOTIC PO) Take by mouth.    [provider]  vitamin C (ASCORBIC ACID) 500 MG tablet Take 500 mg by mouth daily.    [provider]     Critical care time:  35 minutes      CRITICAL CARE Performed by: Otilio Carpen Dayquan Buys   Total critical care time: 35 minutes  Critical care time was exclusive of separately billable procedures and treating other patients.  Critical care was necessary to treat or prevent imminent or life-threatening deterioration.  Critical care was time spent personally by me on the following activities: development of treatment plan with patient and/or surrogate as well as nursing, discussions with consultants, evaluation of patient's response to treatment, examination of patient, obtaining history from patient or surrogate, ordering and performing treatments and interventions, ordering and review of  laboratory studies, ordering and review of radiographic studies, pulse oximetry and re-evaluation of patient's condition.   Otilio Carpen Marnette Perkins, PA-C [facility] See Amion for pager If no response to pager , please call [phone] until 7pm After 7:00 pm call Elink  [phone]?[place]

## 2022-08-17 NOTE — ED Notes (Signed)
Called [facility] -- informed that the patient Bed Assignment is Ready 

## 2022-08-17 NOTE — Progress Notes (Signed)
ANTICOAGULATION CONSULT NOTE - Initial Consult  Pharmacy Consult for heparin Indication: atrial fibrillation  No Known Allergies  Patient Measurements: Height: 5\' 2"  (157.5 cm) Weight: 57.6 kg (127 lb) IBW/kg (Calculated) : 50.1 Heparin Dosing Weight: 57.6kg  Vital Signs: Temp: 98.3 F (36.8 C) (04/02 1111) BP: 125/86 (04/02 1111) Pulse Rate: 146 (04/02 1111)  Labs: No results for input(s): "HGB", "HCT", "PLT", "APTT", "LABPROT", "INR", "HEPARINUNFRC", "HEPRLOWMOCWT", "CREATININE", "CKTOTAL", "CKMB", "TROPONINIHS" in the last 72 hours.  Estimated Creatinine Clearance: 50.3 mL/min (by C-G formula based on SCr of 0.77 mg/dL).   Medical History: Past Medical History:  Diagnosis Date   Adenomatous colon polyp    Allergy    Anemia    as a child, no current problem   Atrial fibrillation    Blood transfusion without reported diagnosis    52 months old   Cataract    bilateral - MD just watching    GERD (gastroesophageal reflux disease)    Hyperlipidemia    Osteoporosis 04/15/2016   Skin cancer 2013   Skin cancer- Nose, face    Medications:  Infusions:   heparin      Assessment: 74 yof presented to the ED with SOB and headache. Found to be in afib and now starting IV heparin. Recently was supposed to start apixaban but patient reports she has never taken it. Baseline labs are pending but CBC was recently normal.   Goal of Therapy:  Heparin level 0.3-0.7 units/ml Monitor platelets by anticoagulation protocol: Yes   Plan:  Heparin bolus 3500 units IV x 1 Heparin gtt 800 units/hr Check an 8 hr heparin level Daily heparin level and CBC  Eshaan Titzer, Rande Lawman 08/17/2022,11:42 AM

## 2022-08-17 NOTE — Progress Notes (Signed)
[facility] Progress Note Patient Name: Terri Alexander DOB: [birth year] MRN: [id number]   Date of Service  08/17/2022  HPI/Events of Note  Hypotension - Patient c/o back pain. Now hypotensive. BP = 72/35 with MAP = 48. Hx of Thoracic aortic aneurysm. No central venous line or CVP. Esmolol and Cardene IV infusions now held.   eICU Interventions  Plan: Phenylephrine IV infusion via PIV. Titrate to SBP > 90 and MAP > 65. H/H STAT. Will request PCCM ground team evaluation patient at bedside.      Intervention Category Major Interventions: Hypotension - evaluation and management  Samarra Ridgely Eugene 08/17/2022, 8:15 PM

## 2022-08-17 NOTE — ED Notes (Signed)
Spoke with Dr. Armandina Gemma regarding b/p of 101/69. Orders received to hold titration of esmolol and cardene unless her sbp increases greater than 120.

## 2022-08-17 NOTE — Telephone Encounter (Signed)
Pt's husband called stating that pt is not feeling well and will be taking pt to [facility] to be seen. Pt has is out of this med diltiazem (CARDIZEM LA) 180 MG 24. Pt's husband stated that the pharmacy med was called into did not have this med. Pt's husband stated that [facility] will not be able to send out this med for a few days. Pt's husband would like to know if this med could be resent to a pharmacy that may have this med available. Please advise.  Cb#: [phone]

## 2022-08-17 NOTE — Progress Notes (Signed)
An USGPIV (ultrasound guided PIV) has been placed for short-term vasopressor infusion. A correctly placed ivWatch must be used when administering Vasopressors. Should this treatment be needed beyond 72 hours, central line access should be obtained.  It will be the responsibility of the bedside nurse to follow best practice to prevent extravasations.   

## 2022-08-18 ENCOUNTER — Inpatient Hospital Stay (HOSPITAL_COMMUNITY): Payer: Medicare Other

## 2022-08-18 DIAGNOSIS — I480 Paroxysmal atrial fibrillation: Secondary | ICD-10-CM | POA: Diagnosis not present

## 2022-08-18 DIAGNOSIS — K6289 Other specified diseases of anus and rectum: Secondary | ICD-10-CM

## 2022-08-18 DIAGNOSIS — R058 Other specified cough: Secondary | ICD-10-CM | POA: Diagnosis not present

## 2022-08-18 DIAGNOSIS — I4891 Unspecified atrial fibrillation: Secondary | ICD-10-CM

## 2022-08-18 DIAGNOSIS — I71019 Dissection of thoracic aorta, unspecified: Secondary | ICD-10-CM | POA: Diagnosis not present

## 2022-08-18 LAB — ECHOCARDIOGRAM COMPLETE
AR max vel: 2.22 cm2
AV Area VTI: 2.14 cm2
AV Area mean vel: 2.09 cm2
AV Mean grad: 3 mmHg
AV Peak grad: 5.6 mmHg
Ao pk vel: 1.18 m/s
Height: 62 in
MV M vel: 4.67 m/s
MV Peak grad: 87.4 mmHg
S' Lateral: 2.5 cm
Weight: 1964.74 oz

## 2022-08-18 LAB — CBC
HCT: 36 % (ref 36.0–46.0)
Hemoglobin: 12 g/dL (ref 12.0–15.0)
MCH: 30 pg (ref 26.0–34.0)
MCHC: 33.3 g/dL (ref 30.0–36.0)
MCV: 90 fL (ref 80.0–100.0)
Platelets: 355 10*3/uL (ref 150–400)
RBC: 4 MIL/uL (ref 3.87–5.11)
RDW: 15.2 % (ref 11.5–15.5)
WBC: 19.2 10*3/uL — ABNORMAL HIGH (ref 4.0–10.5)
nRBC: 0 % (ref 0.0–0.2)

## 2022-08-18 LAB — BASIC METABOLIC PANEL
Anion gap: 11 (ref 5–15)
BUN: 21 mg/dL (ref 8–23)
CO2: 21 mmol/L — ABNORMAL LOW (ref 22–32)
Calcium: 8.5 mg/dL — ABNORMAL LOW (ref 8.9–10.3)
Chloride: 106 mmol/L (ref 98–111)
Creatinine, Ser: 0.78 mg/dL (ref 0.44–1.00)
GFR, Estimated: >60 mL/min (ref >60)
Glucose, Bld: 128 mg/dL — ABNORMAL HIGH (ref 70–99)
Potassium: 3.9 mmol/L (ref 3.5–5.1)
Sodium: 138 mmol/L (ref 135–145)

## 2022-08-18 LAB — MAGNESIUM: Magnesium: 2.1 mg/dL (ref 1.7–2.4)

## 2022-08-18 LAB — PHOSPHORUS: Phosphorus: 3.4 mg/dL (ref 2.5–4.6)

## 2022-08-18 MED ORDER — SODIUM CHLORIDE 0.9 % IV SOLN
2.0000 g | INTRAVENOUS | Status: DC
  Administered 2022-08-18 – 2022-08-20 (×3): 2 g via INTRAVENOUS
  Filled 2022-08-18 (×4): qty 20

## 2022-08-18 MED ORDER — ORAL CARE MOUTH RINSE: 15.0000 mL | OROMUCOSAL | Status: DC | PRN

## 2022-08-18 MED ORDER — METRONIDAZOLE 500 MG/100ML IV SOLN
500.0000 mg | Freq: Two times a day (BID) | INTRAVENOUS | Status: DC
  Administered 2022-08-18 – 2022-08-20 (×5): 500 mg via INTRAVENOUS
  Filled 2022-08-18 (×5): qty 100

## 2022-08-18 MED ORDER — DILTIAZEM HCL ER 60 MG PO CP12
60.0000 mg | ORAL_CAPSULE | Freq: Two times a day (BID) | ORAL | Status: DC
  Administered 2022-08-18 – 2022-08-20 (×4): 60 mg via ORAL
  Filled 2022-08-18 (×5): qty 1

## 2022-08-18 MED ORDER — APIXABAN 5 MG PO TABS
5.0000 mg | ORAL_TABLET | Freq: Two times a day (BID) | ORAL | Status: DC
  Administered 2022-08-18 – 2022-08-20 (×5): 5 mg via ORAL
  Filled 2022-08-18 (×5): qty 1

## 2022-08-18 MED ORDER — FLUTICASONE PROPIONATE HFA 110 MCG/ACT IN AERO
2.0000 | INHALATION_SPRAY | Freq: Two times a day (BID) | RESPIRATORY_TRACT | Status: DC
  Administered 2022-08-18 – 2022-08-20 (×5): 2 via RESPIRATORY_TRACT
  Filled 2022-08-18: qty 12

## 2022-08-18 MED ORDER — CHLORHEXIDINE GLUCONATE CLOTH 2 % EX PADS
6.0000 | MEDICATED_PAD | Freq: Every day | CUTANEOUS | Status: DC
  Administered 2022-08-18 – 2022-08-20 (×3): 6 via TOPICAL

## 2022-08-18 MED ORDER — METOPROLOL TARTRATE 25 MG PO TABS
25.0000 mg | ORAL_TABLET | Freq: Two times a day (BID) | ORAL | Status: DC
  Administered 2022-08-18 – 2022-08-20 (×5): 25 mg via ORAL
  Filled 2022-08-18 (×5): qty 1

## 2022-08-18 NOTE — Progress Notes (Signed)
Echocardiogram 2D Echocardiogram has been performed.  Terri Alexander 08/18/2022, 1:14 PM

## 2022-08-18 NOTE — Progress Notes (Signed)
Nurse notified me that the patient wants to switch to [facility] from [facility]. I spoke to [facility], who will see her tomorrow.  Julian Hy, DO 08/18/22 5:15 PM  Pulmonary & Critical Care  For contact information, see Amion. If no response to pager, please call PCCM consult pager. After hours, 7PM- 7AM, please call Elink.

## 2022-08-18 NOTE — Consult Note (Signed)
CARDIOLOGY CONSULT NOTE  Patient ID: Terri Alexander MRN: [id number] DOB/AGE: [birth year] 72 y.o.  Admit date: 08/17/2022 Referring Physician: PCCM Reason for Consultation:  Afib  HPI:   72 y.o. Caucasian female  with persistent A-fib, type B aortic dissection.  Patient was admitted with complaints of pleuritic chest pain and shortness of breath, was found to have being tachycardic with A-fib and RVR, along with suspected thrombosed chronic type B dissection extending from distal aortic arch to level of T5/T6.  She was started on Cardizem drip and IV diltiazem.  Cardiology consulted for management of A-fib.  Patient was recently seen by her PCP and was found to have Afib wRVR. In the past, patient has episodes of rapid irregular heart beat lasting for hours to up to a day, but had neven been documented on EKG until now. While in ICU, she has been on esmolol drip before, now off. She had episodes of hypotension, possible while on the drip, now resolved. She remains in Afib RVR at rest.   Patient was seen by me once for outpatient consultation in 06/2021 for palpitations.  At that time, monitor was recommended, but it does not appear that she underwent the same. She since saw pur PA once in 09/2021. Patient stated that she had called our office but did not get call back. I personally do not recollect getting a message or find any documentation for the same, but I certainly cannot rule it out.   Past Medical History:  Diagnosis Date   Adenomatous colon polyp    Allergy    Anemia    as a child, no current problem   Atrial fibrillation    Blood transfusion without reported diagnosis    24 months old   Cataract    bilateral - MD just watching    GERD (gastroesophageal reflux disease)    Hyperlipidemia    Osteoporosis 04/15/2016   Skin cancer 2013   Skin cancer- Nose, face     Past Surgical History:  Procedure Laterality Date   CESAREAN SECTION  1981   x 1   COLONOSCOPY  2016   hx polyp,  tics, sigmoid colon mod tortuous Dr Boyd Kerbs,    EYE SURGERY Bilateral    retina repair x 2   LAPAROTOMY  1979   removal of ovarian cyst   nissan fundiplication  [id number]   REPAIR OF COMPLEX TRACTION RETINAL DETACHMENT Bilateral 01/2019      Family History  Problem Relation Age of Onset   Arthritis Mother    Heart disease Mother    Vision loss Mother    Stroke Mother    Depression Sister    Transient ischemic attack Sister 65   Depression Sister    Cancer Maternal Grandmother    Cancer Maternal Grandfather    Early death Maternal Grandfather    Heart disease Maternal Grandfather    Early death Paternal Grandfather    Heart disease Paternal Grandfather    Diabetes Other        Juvenile   Colon cancer Neg Hx    Rectal cancer Neg Hx    Prostate cancer Neg Hx      Social History: Social History   Socioeconomic History   Marital status: Married    Spouse name: Not on file   Number of children: Not on file   Years of education: Not on file   Highest education level: 12th grade  Occupational History   Not on file  Tobacco Use  Smoking status: Never   Smokeless tobacco: Never  Vaping Use   Vaping Use: Never used  Substance and Sexual Activity   Alcohol use: Yes    Alcohol/week: 1.0 standard drink of alcohol    Types: 1 Glasses of wine per week    Comment: rarely   Drug use: No   Sexual activity: Not Currently    Birth control/protection: Post-menopausal  Other Topics Concern   Not on file  Social History Narrative   Not on file   Social Determinants of Health   Financial Resource Strain: Low Risk  (08/09/2022)   Overall Financial Resource Strain (CARDIA)    Difficulty of Paying Living Expenses: Not hard at all  Food Insecurity: No Food Insecurity (08/17/2022)   Hunger Vital Sign    Worried About Running Out of Food in the Last Year: Never true    Ran Out of Food in the Last Year: Never true  Transportation Needs: No Transportation Needs (08/09/2022)   PRAPARE  - Hydrologist (Medical): No    Lack of Transportation (Non-Medical): No  Physical Activity: Unknown (08/09/2022)   Exercise Vital Sign    Days of Exercise per Week: 3 days    Minutes of Exercise per Session: Not on file  Stress: No Stress Concern Present (08/09/2022)   [place]    Feeling of Stress : Only a little  Social Connections: Moderately Integrated (08/09/2022)   Social Connection and Isolation Panel [NHANES]    Frequency of Communication with Friends and Family: Three times a week    Frequency of Social Gatherings with Friends and Family: Twice a week    Attends Religious Services: 1 to 4 times per year    Active Member of [employer] or Organizations: No    Attends Music therapist: Not on file    Marital Status: Married  Human resources officer Violence: Not on file     Medications Prior to Admission  Medication Sig Dispense Refill Last Dose   alendronate (FOSAMAX) 70 MG tablet TAKE 1 TABLET BY MOUTH ONCE A WEEK ON AN EMPTY STOMACH WITH A FULL GLASS OF WATER 12 tablet 3 08/17/2022   Biotin 1000 MCG tablet Take 1,000 mcg by mouth daily.   08/17/2022   Calcium Carb-Cholecalciferol (CALCIUM 600 + D PO) Take 600 mg by mouth once.   08/17/2022   chlorpheniramine (CHLOR-TRIMETON) 4 MG tablet Take 4 mg by mouth 2 (two) times daily as needed for allergies.   08/17/2022   Cholecalciferol (VITAMIN D) 50 MCG (2000 UT) CAPS Take 2,000 Units by mouth daily.   08/17/2022   co-enzyme Q-10 30 MG capsule Take 30 mg by mouth 3 (three) times daily.   08/17/2022   Cranberry-Cholecalciferol 4200-500 MG-UNIT CAPS Take 1 tablet by mouth daily.   08/17/2022   docusate sodium (COLACE) 250 MG capsule Take 250 mg by mouth daily.   08/17/2022   furosemide (LASIX) 40 MG tablet Take 1 tablet (40 mg total) by mouth daily. 30 tablet 3 08/17/2022   Methylcellulose, Laxative, (FIBER THERAPY PO) Take 1 Scoop by mouth daily.    08/17/2022   metoprolol succinate (TOPROL-XL) 50 MG 24 hr tablet Take 1 tablet (50 mg total) by mouth daily. Take with or immediately following a meal. 30 tablet 3 08/17/2022 at 1000   Multiple Vitamin (MULTIVITAMIN) tablet Take 1 tablet by mouth daily.   08/17/2022   Probiotic Product (PROBIOTIC PO) Take 1 tablet by  mouth daily.   08/17/2022   vitamin C (ASCORBIC ACID) 500 MG tablet Take 500 mg by mouth daily.   08/17/2022   diltiazem (CARDIZEM LA) 180 MG 24 hr tablet Take 1 tablet (180 mg total) by mouth daily. (Patient not taking: Reported on 08/18/2022) 90 tablet 1 Not Taking    Review of Systems  Cardiovascular:  Positive for palpitations. Negative for dyspnea on exertion, leg swelling and syncope.      Physical Exam: Physical Exam Vitals and nursing note reviewed.  Constitutional:      General: She is not in acute distress. Neck:     Vascular: No JVD.  Cardiovascular:     Rate and Rhythm: Tachycardia present. Rhythm irregular.     Heart sounds: Normal heart sounds. No murmur heard. Pulmonary:     Effort: Pulmonary effort is normal.     Breath sounds: Normal breath sounds. No wheezing or rales.  Musculoskeletal:     Right lower leg: No edema.        Imaging/tests reviewed and independently interpreted: Lab Results:  CTA C/A/P 08/17/2022: IMPRESSION: VASCULAR: 1. 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 Recommend continued management and follow-up per vascular surgery.   NONVASCULAR:   1. Trace right and small left pleural effusions. 2. Nonspecific 3 mm ground-glass nodule along the right minor fissure. No follow-up needed if patient is low-risk.This recommendation follows the consensus statement: Guidelines for Management of Incidental Pulmonary Nodules Detected on CT Images: From the Fleischner Society 2017; Radiology  2017; 284:228-243. 3. Small volume hyperattenuating pelvic free fluid, suspicious for hemorrhage adjacent to mural thickening of the rectum, which may represent proctitis. 4. Appendix is not discretely seen. 5. Pelvic left kidney.   These results were called by telephone at the time of interpretation on 08/17/2022 at 2:10 pm to provider [facility] , who verbally acknowledged these results.  Cardiac Studies:  Telemetry 08/18/2022: Afib w/RVR rate 100-130 bpm  EKG 08/17/2022: A-fib with RVR Diffuse ST depression, probably rate related  Echocardiogram 08/18/2022:  1. Left ventricular ejection fraction, by estimation, is 60 to 65%. The  left ventricle has normal function. The left ventricle has no regional  wall motion abnormalities. Left ventricular diastolic function could not  be evaluated.   2. Right ventricular systolic function is normal. The right ventricular  size is normal. There is normal pulmonary artery systolic pressure. The  estimated right ventricular systolic pressure is [id number] mmHg.   3. Left atrial size was mild to moderately dilated.   4. Right atrial size was mildly dilated.   5. The mitral valve is normal in structure. Mild mitral valve  regurgitation. No evidence of mitral stenosis.   6. Tricuspid valve regurgitation is mild to moderate.   7. The aortic valve is tricuspid. Aortic valve regurgitation is not  visualized. No aortic stenosis is present.   8. The inferior vena cava is dilated in size with >50% respiratory  variability, suggesting right atrial pressure of 8 mmHg.    Assessment & Recommendations:   72 y.o. Caucasian female  with persistent A-fib, type B aortic dissection  Afib w/RVR: Likely paroxysmal Afib, rate uncontrolled. Currently on PO metoprolol tartrate 25 mg bid. Added diltiazem SR 60 mg bid, can uptitrate as tolerated. She is not too excited with the prospect of cardioversion. With recently discovered aortic mural thrombus, it is not  without thromembolic risk, even with a clean LAA. That said, patient herself goes in and out of Afib. I would pursue rate conrol approach at this time. CHA2DS2VASc score 4, annual stroke risk >5%. Continue Eliquis 5 mg bid.   Tytpe B arotic dissection: Likely chronic. Management as per vascular surgery.  Later on today, I was informed that patient would like to see [facility]. [facility] have been informed by CCM.   Discussed interpretation of tests and management recommendations with the primary team     Nigel Mormon, MD Pager: [phone] Office: [phone]

## 2022-08-18 NOTE — Progress Notes (Signed)
Paroxysmal Afib. Continue metoprolol tartrate 25 mg bid. Added diltiazem SR 60 mg bid. Continue eliquis. Avoid cardioversion if possible with discovery of mural thrombus in aorta. Full consult note to follow.   Nigel Mormon, MD Pager: [phone] Office: [phone]

## 2022-08-18 NOTE — Progress Notes (Signed)
   VASCULAR SURGERY ASSESSMENT & PLAN:   THORACIC AORTIC ANEURYSM VERSUS CHRONIC TYPE B AORTIC DISSECTION: No chest pain. I believe these findings are chronic. I will arrange a f/u CT chest in 6 months.    SUBJECTIVE:   Sleeping comfortably. No CP last night. Some mild upper back back last night which resolved.   PHYSICAL EXAM:   Vitals:   08/18/22 0630 08/18/22 0645 08/18/22 0700 08/18/22 0715  BP: 114/74 109/67  97/62  Pulse: 96 94 100 93  Resp: 15 15 19 18   Temp:      TempSrc:      SpO2: 95% 93% 94% 95%  Weight:      Height:       Breathing comfortably.   LABS:   Lab Results  Component Value Date   WBC 19.2 (H) 08/18/2022   HGB 12.0 08/18/2022   HCT 36.0 08/18/2022   MCV 90.0 08/18/2022   PLT 355 08/18/2022   Lab Results  Component Value Date   CREATININE 0.78 08/18/2022   PROBLEM LIST:    Principal Problem:   Aortic dissection Active Problems:   Aortic dissection distal to left subclavian   CURRENT MEDS:    Chlorhexidine Gluconate Cloth  6 each Topical Daily   diltiazem  90 mg Oral Q6H    Deitra Mayo Office: [phone] 08/18/2022

## 2022-08-18 NOTE — Progress Notes (Signed)
NAME:  Terri Alexander, MRN:  [id number], DOB:  08/03/1950, LOS: 1 ADMISSION DATE:  08/17/2022, CONSULTATION DATE:  08/18/22 REFERRING MD:  Armandina Gemma, CHIEF COMPLAINT:  shortness of breath  History of Present Illness:  Terri Alexander is a 72 y.o. F with PMH significant for atrial fibrillation, HL, GERD and recent Covid (1 month ago) who presented to [facility] with the acute onset of pleuritic chest pain and shortness of breath.   She was recently diagnosed with atrial fibrillation and started on toprol XL and was prescribed Eliquis, though did not start this and has been out of cardizem.   She presented to the tachycardic, but otherwise hemodynamically stable.  Labs significant for WBC 18k, BNP 382, troponin WNL,  A CTA of the chest/abdomen and pelvis was obtained showing thoracic aortic aneurysm 5.6cm with suspected thrombosed chronic type B dissection extending from the distal arch to the level of T5-6.  She was started on a cardene drip and given IV cardizem and transferred to [facility] for vascular surgery evaluation and admission.  She incidentally notes a small amount of red blood in her stool the last few days with possible proctitis seen on CT  Pertinent  Medical History   has a past medical history of Adenomatous colon polyp, Allergy, Anemia, Atrial fibrillation, Blood transfusion without reported diagnosis, Cataract, GERD (gastroesophageal reflux disease), Hyperlipidemia, Osteoporosis (04/15/2016), and Skin cancer (2013).   Significant Hospital Events: Including procedures, antibiotic start and stop dates in addition to other pertinent events   4/2 presented to [facility], found to have type B dissection, started on cardene gtt and transferred to [facility]. Converted to NSR after transfer, then hypotensive requiring vasopressors.   Interim History / Subjective:  Today she had some mild pelvic pain related to what she has always attributed to diverticular pain.  Otherwise she denies complaints.  We  reviewed her recent A-fib history.  She was noted several weeks ago to be in atrial fibrillation with a heart rate in the 140s by her PCP.  She had follow-up visits after metoprolol failed to fully control her heart rate.  She did not start Eliquis prior to admission.  Objective   Blood pressure (!) 103/55, pulse (!) 107, temperature 99.3 F (37.4 C), temperature source Oral, resp. rate (!) 22, height 5\' 2"  (1.575 m), weight 55.7 kg, SpO2 95 %.    FiO2 (%):  [28 %] 28 %   Intake/Output Summary (Last 24 hours) at 08/18/2022 1010 Last data filed at 08/18/2022 0800 Gross per 24 hour  Intake 1203.06 ml  Output 400 ml  Net 803.06 ml    Filed Weights   08/17/22 1109 08/18/22 0703  Weight: 57.6 kg 55.7 kg   General: Elderly woman lying in bed no acute distress, appears younger than stated age HEENT:/AT, eyes anicteric Neuro: Awake and alert, answering questions appropriately, moving all extremities CV: S1-S2, minimal tachycardia in the low 100s, irregular rhythm.  Atrial fibrillation on telemetry. PULM: Breathing comfortably on room air, CTAB. GI: Soft, nontender,. Extremities: No cyanosis or edema. Skin: Warm, dry, no diffuse rashes  BUN 21 Cr 0.78 WBC 192 H/H 12/36 Platelets 355 CT chest personally reviewed> tiny effusion on the L, some basilar atelectasis vs chronic scarring in bilateral bases. Thoracic dissection as described. Airway thickening, no nodules.  Possible proctitis.  Resolved Hospital Problem list     Assessment & Plan:   Descending thoracic aortic aneurysm with peripheral thrombosis and chronic type B aortic dissection, incidental finding -appreciate vascular surgeyr's  management; planning for OP follow up and CT scan -pain control PRN -Per Dr. Scot Dock, okay to start anticoagulation for atrial fibrillation and she does not require very strict blood pressure and heart rate goals.  Atrial fibrillation, paroxysmal -Start apixaban twice daily -monitor on  tele -metoprolol 25 mg twice daily -echo ordered -Cardiology consultation to help with rate and rhythm control given difficulty as an outpatient with this.  Likely proctitis; FOBT negative -Ceftriaxone and metronidazole for 5 days  Cough since having recent COVID infection - Trial of ICS twice daily for 1 month.  Likely will be able to discontinue after her airway inflammation has calm down.  Terri Alexander and her husband and son were updated at bedside.  She is stable to transfer out of the intensive care unit today.  TR[facility] to assume care tomorrow.  Best Practice (right click and "Reselect all SmartList Selections" daily)   Diet/type: Regular consistency (see orders) DVT prophylaxis: DOAC GI prophylaxis: N/A Lines: N/A Foley:  N/A Code Status:  full code Last date of multidisciplinary goals of care discussion [ 4/3 ]  Labs   CBC: Recent Labs  Lab 08/17/22 1229 08/17/22 2105 08/18/22 0301  WBC 18.6*  --  19.2*  NEUTROABS 14.9*  --   --   HGB 13.7 11.5* 12.0  HCT 41.0 35.0* 36.0  MCV 88.9  --  90.0  PLT 345  --  355     Basic Metabolic Panel: Recent Labs  Lab 08/17/22 1229 08/18/22 0301  NA 139 138  K 3.9 3.9  CL 102 106  CO2 26 21*  GLUCOSE 100* 128*  BUN 21 21  CREATININE 0.64 0.78  CALCIUM 9.8 8.5*  MG  --  2.1  PHOS  --  3.4    GFR: Estimated Creatinine Clearance: 50.3 mL/min (by C-G formula based on SCr of 0.78 mg/dL). Recent Labs  Lab 08/17/22 1229 08/17/22 2104 08/17/22 2231 08/18/22 0301  WBC 18.6*  --   --  19.2*  LATICACIDVEN  --  1.7 1.6  --      Critical care time:       Julian Hy, DO 08/18/22 1:50 PM [facility]  For contact information, see Amion. If no response to pager, please call PCCM consult pager. After hours, 7PM- 7AM, please call Elink.

## 2022-08-19 DIAGNOSIS — I480 Paroxysmal atrial fibrillation: Secondary | ICD-10-CM | POA: Diagnosis not present

## 2022-08-19 DIAGNOSIS — I71019 Dissection of thoracic aorta, unspecified: Secondary | ICD-10-CM | POA: Diagnosis not present

## 2022-08-19 LAB — CBC
HCT: 35 % — ABNORMAL LOW (ref 36.0–46.0)
Hemoglobin: 11.8 g/dL — ABNORMAL LOW (ref 12.0–15.0)
MCH: 29.6 pg (ref 26.0–34.0)
MCHC: 33.7 g/dL (ref 30.0–36.0)
MCV: 87.9 fL (ref 80.0–100.0)
Platelets: 295 10*3/uL (ref 150–400)
RBC: 3.98 MIL/uL (ref 3.87–5.11)
RDW: 14.9 % (ref 11.5–15.5)
WBC: 11.6 10*3/uL — ABNORMAL HIGH (ref 4.0–10.5)
nRBC: 0 % (ref 0.0–0.2)

## 2022-08-19 LAB — BASIC METABOLIC PANEL
Anion gap: 12 (ref 5–15)
BUN: 20 mg/dL (ref 8–23)
CO2: 22 mmol/L (ref 22–32)
Calcium: 8.4 mg/dL — ABNORMAL LOW (ref 8.9–10.3)
Chloride: 106 mmol/L (ref 98–111)
Creatinine, Ser: 0.74 mg/dL (ref 0.44–1.00)
GFR, Estimated: >60 mL/min (ref >60)
Glucose, Bld: 105 mg/dL — ABNORMAL HIGH (ref 70–99)
Potassium: 3.5 mmol/L (ref 3.5–5.1)
Sodium: 140 mmol/L (ref 135–145)

## 2022-08-19 LAB — MAGNESIUM: Magnesium: 2.1 mg/dL (ref 1.7–2.4)

## 2022-08-19 MED ORDER — POTASSIUM CHLORIDE CRYS ER 20 MEQ PO TBCR
40.0000 meq | EXTENDED_RELEASE_TABLET | Freq: Once | ORAL | Status: AC
  Administered 2022-08-19: 40 meq via ORAL
  Filled 2022-08-19: qty 2

## 2022-08-19 NOTE — Progress Notes (Signed)
PROGRESS NOTE    Terri Alexander  [id number] DOB: 21-May-1950 DOA: 08/17/2022  PCP: Susy Frizzle, MD    Brief Narrative:  This 72 yrs old female with PMH significant for atrial fibrillation, Hyperlipidemia, GERD and recent Covid (1 month ago) who presented to the ED with the acute onset of pleuritic chest pain and shortness of breath. She was recently diagnosed with atrial fibrillation and started on toprol XL and was prescribed Eliquis, though she did not start this and has been out of cardizem.  She presented to the  ED with palpitations but otherwise hemodynamically stable.  Labs significant for WBC 18k, BNP 382, troponin WNL,  CTA of the chest/abdomen and pelvis was obtained showing thoracic aortic aneurysm 5.6cm with suspected thrombosed chronic type B dissection extending from the distal arch to the level of T5-6.  She was started on a cardene drip and given IV cardizem and transferred to [facility] for vascular surgery evaluation and admission.  Vascular surgery was consulted. She incidentally notes a small amount of red blood in her stool the last few days with possible proctitis seen on CT  Assessment & Plan:   Principal Problem:   Aortic dissection Active Problems:   Aortic dissection distal to left subclavian   Atrial fibrillation with RVR  Descending thoracic aortic aneurysm with chronic type B aortic dissection: Patient presented with pleuritic chest pain associated with shortness of breath. CT chest showed descending aortic aneurysm with chronic type B aortic dissection. Vascular surgery consult appreciated.  Recommended outpatient follow-up,  No inpatient surgical intervention needed. Continue adequate pain control. As per Dr. Scot Dock, okay to start anticoagulation for atrial fibrillation. She does not require very strict blood pressure and heart rate goals.   Patient is started on Eliquis. She reports chest pain has resolved.  Paroxysmal atrial fibrillation: HR  well controlled. Continue Eliquis twice daily Continue telemetry. Continue metoprolol 25 mg twice daily. Obtain 2D echocardiogram. Cardiology is consulted to help with the rate and rhythm control given difficulty as outpatient with rate control. Started on Cardizem 60 mg every 12 hours.  Proctitis. Continue ceftriaxone and Flagyl for 5 days. FOBT negative  Cough secondary to recent COVID infection: Continue a trial of inhaled corticosteroid twice daily for a month  DVT prophylaxis: Eliquis Code Status:  Full code Family Communication: No family at bed side. Disposition Plan:   Status is: Inpatient Remains inpatient appropriate because:  Admitted for pleuritic chest pain and shortness of breath found to have thoracic aortic aneurysm with dissection.  Vascular surgery consulted,  No surgical intervention needed at this time.   Consultants:  Vascular surgery Cardiology  Procedures: None  Antimicrobials:  Anti-infectives (From admission, onward)    Start     Dose/Rate Route Frequency Ordered Stop   08/18/22 1145  metroNIDAZOLE (FLAGYL) IVPB 500 mg        500 mg 100 mL/hr over 60 Minutes Intravenous Every 12 hours 08/18/22 1058     08/18/22 1145  cefTRIAXone (ROCEPHIN) 2 g in sodium chloride 0.9 % 100 mL IVPB        2 g 200 mL/hr over 30 Minutes Intravenous Every 24 hours 08/18/22 1058        Subjective: Patient was seen and examined at bedside.  Overnight events noted.   Patient was sitting comfortably on the chair, heart rate  still elevated in the 120s to 130s.   Denies any chest pain or palpitations.  Objective: Vitals:   08/19/22 1003 08/19/22 1100 08/19/22  1135 08/19/22 1146  BP: (!) 117/92 (!) 118/103 123/76   Pulse: (!) 114 (!) 115 (!) 109   Resp: (!) 35 (!) 31    Temp:    (!) 96.6 F (35.9 C)  TempSrc:    Axillary  SpO2: 96% 97% 98%   Weight:      Height:        Intake/Output Summary (Last 24 hours) at 08/19/2022 1155 Last data filed at 08/18/2022  1700 Gross per 24 hour  Intake 176.17 ml  Output --  Net 176.17 ml   Filed Weights   08/17/22 1109 08/18/22 0703 08/19/22 0500  Weight: 57.6 kg 55.7 kg 58.4 kg    Examination:  General exam: Appears calm and comfortable, not in any acute distress. Respiratory system: Clear to auscultation. Respiratory effort normal.  RR 20 Cardiovascular system: S1 & S2 heard, RRR. No JVD, murmurs, rubs, gallops or clicks. No pedal edema. Gastrointestinal system: Abdomen is soft, non tender, non distended, BS+. Central nervous system: Alert and oriented X 3. No focal neurological deficits. Extremities: No edema, no cyanosis, no clubbing Skin: No rashes, lesions or ulcers Psychiatry: Judgement and insight appear normal. Mood & affect appropriate.     Data Reviewed: I have personally reviewed following labs and imaging studies  CBC: Recent Labs  Lab 08/17/22 1229 08/17/22 2105 08/18/22 0301 08/19/22 0056  WBC 18.6*  --  19.2* 11.6*  NEUTROABS 14.9*  --   --   --   HGB 13.7 11.5* 12.0 11.8*  HCT 41.0 35.0* 36.0 35.0*  MCV 88.9  --  90.0 87.9  PLT 345  --  355 [id number]   Basic Metabolic Panel: Recent Labs  Lab 08/17/22 1229 08/18/22 0301 08/19/22 0056  NA 139 138 140  K 3.9 3.9 3.5  CL 102 106 106  CO2 26 21* 22  GLUCOSE 100* 128* 105*  BUN 21 21 20   CREATININE 0.64 0.78 0.74  CALCIUM 9.8 8.5* 8.4*  MG  --  2.1 2.1  PHOS  --  3.4  --    GFR: Estimated Creatinine Clearance: 50.3 mL/min (by C-G formula based on SCr of 0.74 mg/dL). Liver Function Tests: No results for input(s): "AST", "ALT", "ALKPHOS", "BILITOT", "PROT", "ALBUMIN" in the last 168 hours. No results for input(s): "LIPASE", "AMYLASE" in the last 168 hours. No results for input(s): "AMMONIA" in the last 168 hours. Coagulation Profile: No results for input(s): "INR", "PROTIME" in the last 168 hours. Cardiac Enzymes: No results for input(s): "CKTOTAL", "CKMB", "CKMBINDEX", "TROPONINI" in the last 168 hours. BNP (last  3 results) No results for input(s): "PROBNP" in the last 8760 hours. HbA1C: No results for input(s): "HGBA1C" in the last 72 hours. CBG: No results for input(s): "GLUCAP" in the last 168 hours. Lipid Profile: No results for input(s): "CHOL", "HDL", "LDLCALC", "TRIG", "CHOLHDL", "LDLDIRECT" in the last 72 hours. Thyroid Function Tests: No results for input(s): "TSH", "T4TOTAL", "FREET4", "T3FREE", "THYROIDAB" in the last 72 hours. Anemia Panel: No results for input(s): "VITAMINB12", "FOLATE", "FERRITIN", "TIBC", "IRON", "RETICCTPCT" in the last 72 hours. Sepsis Labs: Recent Labs  Lab 08/17/22 2104 08/17/22 2231  LATICACIDVEN 1.7 1.6    No results found for this or any previous visit (from the past 240 hour(s)).   Radiology Studies: ECHOCARDIOGRAM COMPLETE  Result Date: 08/18/2022    ECHOCARDIOGRAM REPORT   Patient Name:   NATURELLE NEVIUS Date of Exam: 08/18/2022 Medical Rec #:  [id number]      Height:  62.0 in Accession #:    [id number]     Weight:       122.8 lb Date of Birth:  1950-11-15      BSA:          1.554 m Patient Age:    61 years       BP:           120/74 mmHg Patient Gender: F              HR:           111 bpm. Exam Location:  Inpatient Procedure: 2D Echo, Cardiac Doppler and Color Doppler Indications:    Atrial Fibrillation I48.91  History:        Patient has prior history of Echocardiogram examinations, most                 recent 06/26/2021. Risk Factors:Dyslipidemia.  Sonographer:    Ronny Flurry Referring Phys: [id number] [place]  1. Left ventricular ejection fraction, by estimation, is 60 to 65%. The left ventricle has normal function. The left ventricle has no regional wall motion abnormalities. Left ventricular diastolic function could not be evaluated.  2. Right ventricular systolic function is normal. The right ventricular size is normal. There is normal pulmonary artery systolic pressure. The estimated right ventricular systolic pressure is [id number]  mmHg.  3. Left atrial size was mild to moderately dilated.  4. Right atrial size was mildly dilated.  5. The mitral valve is normal in structure. Mild mitral valve regurgitation. No evidence of mitral stenosis.  6. Tricuspid valve regurgitation is mild to moderate.  7. The aortic valve is tricuspid. Aortic valve regurgitation is not visualized. No aortic stenosis is present.  8. The inferior vena cava is dilated in size with >50% respiratory variability, suggesting right atrial pressure of 8 mmHg. FINDINGS  Left Ventricle: Left ventricular ejection fraction, by estimation, is 60 to 65%. The left ventricle has normal function. The left ventricle has no regional wall motion abnormalities. The left ventricular internal cavity size was normal in size. There is  no left ventricular hypertrophy. Left ventricular diastolic function could not be evaluated due to atrial fibrillation. Left ventricular diastolic function could not be evaluated. Right Ventricle: The right ventricular size is normal. No increase in right ventricular wall thickness. Right ventricular systolic function is normal. There is normal pulmonary artery systolic pressure. The tricuspid regurgitant velocity is 2.15 m/s, and  with an assumed right atrial pressure of 8 mmHg, the estimated right ventricular systolic pressure is [id number] mmHg. Left Atrium: Left atrial size was mild to moderately dilated. Right Atrium: Right atrial size was mildly dilated. Pericardium: There is no evidence of pericardial effusion. Mitral Valve: The mitral valve is normal in structure. Mild mitral valve regurgitation, with centrally-directed jet. No evidence of mitral valve stenosis. Tricuspid Valve: The tricuspid valve is normal in structure. Tricuspid valve regurgitation is mild to moderate. No evidence of tricuspid stenosis. Aortic Valve: The aortic valve is tricuspid. Aortic valve regurgitation is not visualized. No aortic stenosis is present. Aortic valve mean gradient measures  3.0 mmHg. Aortic valve peak gradient measures 5.6 mmHg. Aortic valve area, by VTI measures 2.14 cm. Pulmonic Valve: The pulmonic valve was normal in structure. Pulmonic valve regurgitation is not visualized. No evidence of pulmonic stenosis. Aorta: The aortic root is normal in size and structure. Venous: The inferior vena cava is dilated in size with greater than 50% respiratory variability, suggesting right atrial pressure of 8  mmHg. IAS/Shunts: No atrial level shunt detected by color flow Doppler.  LEFT VENTRICLE PLAX 2D LVIDd:         3.80 cm   Diastology LVIDs:         2.50 cm   LV e' medial:    9.95 cm/s LV PW:         1.10 cm   LV E/e' medial:  11.2 LV IVS:        0.80 cm   LV e' lateral:   16.20 cm/s LVOT diam:     1.70 cm   LV E/e' lateral: 6.9 LV SV:         41 LV SV Index:   27 LVOT Area:     2.27 cm  RIGHT VENTRICLE             IVC RV S prime:     11.50 cm/s  IVC diam: 2.30 cm TAPSE (M-mode): 1.6 cm LEFT ATRIUM             Index        RIGHT ATRIUM           Index LA diam:        4.00 cm 2.57 cm/m   RA Area:     17.50 cm LA Vol (A2C):   39.3 ml 25.29 ml/m  RA Volume:   43.10 ml  27.74 ml/m LA Vol (A4C):   53.9 ml 34.69 ml/m LA Biplane Vol: 51.3 ml 33.02 ml/m  AORTIC VALVE AV Area (Vmax):    2.22 cm AV Area (Vmean):   2.09 cm AV Area (VTI):     2.14 cm AV Vmax:           118.00 cm/s AV Vmean:          82.500 cm/s AV VTI:            0.193 m AV Peak Grad:      5.6 mmHg AV Mean Grad:      3.0 mmHg LVOT Vmax:         115.33 cm/s LVOT Vmean:        75.867 cm/s LVOT VTI:          0.182 m LVOT/AV VTI ratio: 0.94  AORTA Ao Root diam: 3.40 cm Ao Asc diam:  3.40 cm MR Peak grad: 87.4 mmHg     TRICUSPID VALVE MR Mean grad: 61.0 mmHg     TR Peak grad:   18.5 mmHg MR Vmax:      467.33 cm/s   TR Vmax:        215.00 cm/s MR Vmean:     375.0 cm/s MV E velocity: 111.00 cm/s  SHUNTS                             Systemic VTI:  0.18 m                             Systemic Diam: 1.70 cm Dani Gobble Croitoru MD  Electronically signed by Sanda Klein MD Signature Date/Time: 08/18/2022/3:12:15 PM    Final    CT Angio Chest/Abd/Pel for Dissection W and/or Wo Contrast  Result Date: 08/17/2022 CLINICAL DATA:  Shortness of breath and one-week history of tachycardia associated with headache EXAM: CT ANGIOGRAPHY CHEST, ABDOMEN AND PELVIS TECHNIQUE: Non-contrast CT of the chest was initially obtained. Multidetector CT imaging through the chest, abdomen and pelvis  was performed using the standard protocol during bolus administration of intravenous contrast. Multiplanar reconstructed images and MIPs were obtained and reviewed to evaluate the vascular anatomy. RADIATION DOSE REDUCTION: This exam was performed according to the departmental dose-optimization program which includes automated exposure control, adjustment of the mA and/or kV according to patient size and/or use of iterative reconstruction technique. CONTRAST:  32mL OMNIPAQUE IOHEXOL 350 MG/ML SOLN COMPARISON:  CT abdomen and pelvis dated 12/18/2020 FINDINGS: CTA CHEST FINDINGS Cardiovascular: Preferential opacification of the thoracic aorta. Ascending aorta measures 4.2 x 4.0 cm. The proximal descending aorta measures 5.6 x 4.8 cm. Irregular hypoattenuating noncalcified thrombus is seen extending from the level of the distal arch immediately distal to the left subclavian artery origin to the level of the diaphragmatic hiatus. At the proximal aspect, there is slight displacement of the intimal calcification, suggestive of dissection (8:14) with thrombosis of the false lumen, extending to the level T5-6. Normal heart size. No pericardial effusion. Mediastinum/Nodes: Imaged thyroid gland without nodules meeting criteria for imaging follow-up by size. Small hiatal hernia. No pathologically enlarged axillary, supraclavicular, mediastinal, or hilar lymph nodes. Lungs/Pleura: The central airways are patent. Bilateral lower lobe relaxation atelectasis. 3 mm ground-glass nodule  along the right minor fissure (7:66). No pneumothorax. Trace right and small left pleural effusions. Musculoskeletal: No acute or abnormal lytic or blastic osseous lesions. Review of the MIP images confirms the above findings. CTA ABDOMEN AND PELVIS FINDINGS VASCULAR Aorta: Normal caliber aorta without aneurysm, dissection, vasculitis or significant stenosis. Celiac: Patent without evidence of aneurysm, dissection, vasculitis or significant stenosis. SMA: Patent without evidence of aneurysm, dissection, vasculitis or significant stenosis. Renals: Single right and 2 left renal arteries. The left renal arteries arise from the distal abdominal aorta, inferior to the IMA takeoff and from the proximal right common iliac artery. The renal arteries are patent without evidence of aneurysm, dissection, vasculitis, fibromuscular dysplasia or significant stenosis. IMA: Patent without evidence of aneurysm, dissection, vasculitis or significant stenosis. Inflow: Patent without evidence of aneurysm, dissection, vasculitis or significant stenosis. Proximal Outflow: Bilateral common femoral and visualized portions of the superficial and profunda femoral arteries are patent without evidence of aneurysm, dissection, vasculitis or significant stenosis. Veins: No obvious venous abnormality within the limitations of this arterial phase study. Review of the MIP images confirms the above findings. NON-VASCULAR Hepatobiliary: No focal hepatic lesions. No intra or extrahepatic biliary ductal dilation. Normal gallbladder. Pancreas: No focal lesions or main ductal dilation. Spleen: Normal in size without focal abnormality. Adrenals/Urinary Tract: No adrenal nodules. Pelvic left kidney. No suspicious renal mass, calculi or hydronephrosis. Bilateral simple cysts. No specific follow-up imaging recommended. no focal bladder wall thickening. Stomach/Bowel: Normal appearance of the stomach. Mural thickening of the rectum. Sigmoid diverticulosis.  Appendix is not discretely seen. Lymphatic: No enlarged abdominal or pelvic lymph nodes. Reproductive: No adnexal masses. Other: Hyperattenuating small volume pelvic free fluid. No free air. Musculoskeletal: No acute or abnormal lytic or blastic osseous lesions. Review of the MIP images confirms the above findings. IMPRESSION: VASCULAR: 1. 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 Recommend continued management and follow-up per vascular surgery. NONVASCULAR: 1. Trace right and small left pleural effusions. 2. Nonspecific 3 mm ground-glass nodule along the right minor fissure. No follow-up needed if patient is low-risk.This recommendation follows the consensus statement: Guidelines for Management of Incidental Pulmonary Nodules Detected on CT Images:  From the [place] 2017; Radiology 2017; 284:228-243. 3. Small volume hyperattenuating pelvic free fluid, suspicious for hemorrhage adjacent to mural thickening of the rectum, which may represent proctitis. 4. Appendix is not discretely seen. 5. Pelvic left kidney. These results were called by telephone at the time of interpretation on 08/17/2022 at 2:10 pm to provider [facility] , who verbally acknowledged these results. Electronically Signed   By: Darrin Nipper M.D.   On: 08/17/2022 14:43   CT Angio Chest PE W/Cm &/Or Wo Cm  Result Date: 08/17/2022 CLINICAL DATA:  Headache, AFib, concern for pulmonary embolism. EXAM: CT ANGIOGRAPHY CHEST WITH CONTRAST TECHNIQUE: Multidetector CT imaging of the chest was performed using the standard protocol during bolus administration of intravenous contrast. Multiplanar CT image reconstructions and MIPs were obtained to evaluate the vascular anatomy. RADIATION DOSE REDUCTION: This exam was performed according to the departmental dose-optimization program which includes  automated exposure control, adjustment of the mA and/or kV according to patient size and/or use of iterative reconstruction technique. CONTRAST:  49mL OMNIPAQUE IOHEXOL 350 MG/ML SOLN COMPARISON:  No direct comparison study available. CT abdomen/pelvis 12/18/2020 FINDINGS: Cardiovascular: There is adequate opacification of the pulmonary arteries to the segmental level. There is no evidence of pulmonary embolism. The ascending thoracic aorta is aneurysmal measuring up to 4.1 cm. The arch measures up to 4.8 cm in the sagittal plane (8-95). There is crescentic hypodensity along the posterior wall of the aortic arch and descending thoracic aorta which may reflect dissection/intramural hematoma or noncalcified plaque. Curvilinear hyperdense material within this hypodensity is consistent with calcification. The thoracic aorta remains patent though is suboptimally opacified. The heart size is normal. There is no pericardial effusion. There is reflux of contrast into the IVC. Mediastinum/Nodes: The imaged thyroid is unremarkable. The esophagus is grossly unremarkable. There is a small hiatal hernia. There is no mediastinal, axillary, or hilar lymphadenopathy. Lungs/Pleura: The trachea and central airways are patent. There is a small right posterolateral diverticulum in the upper trachea (6-16). There are small bilateral pleural effusions with adjacent atelectasis. There is no other focal airspace opacity. There is no pulmonary edema. There is no pneumothorax There are no suspicious nodules. Upper Abdomen: The imaged portions of the upper abdominal viscera are unremarkable. Musculoskeletal: There is no acute osseous abnormality or suspicious osseous lesion. Review of the MIP images confirms the above findings. IMPRESSION: 1. Aneurysmal ascending thoracic aorta and aortic arch measuring up to 4.8 cm at the arch with crescentic hypodensity along the posterior wall throughout the thoracic aorta beginning at the arch just beyond  the origin of the brachiocephalic artery extending to the lower thoracic aorta which may reflect dissection/acute intramural hematoma or noncalcified plaque. Recommend vascular surgery consultation due to size and dissection protocol CTA of the chest. 2. No evidence of pulmonary embolism. 3. Reflux of contrast into the IVC suggestive of right heart dysfunction. 4. Small bilateral pleural effusions. These results were called by telephone at the time of interpretation on 08/17/2022 at 1:35 pm to provider [facility] , who verbally acknowledged these results. Electronically Signed   By: Valetta Mole M.D.   On: 08/17/2022 13:41    Scheduled Meds:  apixaban  5 mg Oral BID   Chlorhexidine Gluconate Cloth  6 each Topical Daily   diltiazem  60 mg Oral Q12H   fluticasone  2 puff Inhalation BID   metoprolol tartrate  25 mg Oral BID   Continuous Infusions:  sodium chloride Stopped (08/18/22 1421)   cefTRIAXone (ROCEPHIN)  IV 2 g (08/19/22 1131)   metronidazole 500 mg (08/19/22 0034)     LOS: 2 days    Time spent: 14 mins    Duard Brady, MD [facility]   If 7PM-7AM, please contact night-coverage

## 2022-08-19 NOTE — TOC Initial Note (Signed)
Transition of Care [facility]) - Initial/Assessment Note    Patient Details  Name: Terri Alexander MRN: [id number] Date of Birth: Nov 12, 1950  Transition of [facility]) CM/SW Contact:    Erenest Rasher, RN Phone Number: [phone] 08/19/2022, 12:59 PM  Clinical Narrative:                 CM spoke to pt and husband at bedside. States she was independent PTA. States she still has issues with balance. Waiting PT/OT evaluation. No DME needed. May need [facility]. Will need HHPT orders with [facility].   Expected Discharge Plan: Home/Self Care Barriers to Discharge: Continued Medical Work up   Patient Goals and CMS Choice Patient states their goals for this hospitalization and ongoing recovery are:: wants to remain independent CMS Medicare.gov Compare Post Acute Care list provided to:: Patient        Expected Discharge Plan and Services   Discharge Planning Services: CM Consult   Living arrangements for the past 2 months: Single Family Home                                      Prior Living Arrangements/Services Living arrangements for the past 2 months: Single Family Home Lives with:: Spouse Patient language and need for interpreter reviewed:: Yes        Need for Family Participation in Patient Care: No (Comment) Care giver support system in place?: Yes (comment)   Criminal Activity/Legal Involvement Pertinent to Current Situation/Hospitalization: No - Comment as needed  Activities of Daily Living Home Assistive Devices/Equipment: None ADL Screening (condition at time of admission) Patient's cognitive ability adequate to safely complete daily activities?: Yes Is the patient deaf or have difficulty hearing?: No Does the patient have difficulty seeing, even when wearing glasses/contacts?: No Does the patient have difficulty concentrating, remembering, or making decisions?: No Patient able to express need for assistance with ADLs?: No Does the patient have difficulty dressing or  bathing?: No Independently performs ADLs?: Yes (appropriate for developmental age) Does the patient have difficulty walking or climbing stairs?: No Weakness of Legs: None Weakness of Arms/Hands: None  Permission Sought/Granted Permission sought to share information with : Case Manager, Family Supports, PCP Permission granted to share information with : Yes, Verbal Permission Granted  Share Information with NAME: Virtue Klint     Permission granted to share info w Relationship: husband  Permission granted to share info w Contact Information: [phone]  Emotional Assessment Appearance:: Appears stated age Attitude/Demeanor/Rapport: Engaged Affect (typically observed): Accepting Orientation: : Oriented to Self, Oriented to Place, Oriented to  Time, Oriented to Situation   Psych Involvement: No (comment)  Admission diagnosis:  Aortic dissection [I71.00] SOB (shortness of breath) [R06.02] Atrial fibrillation with RVR [I48.91] Aortic dissection distal to left subclavian [I71.019] Patient Active Problem List   Diagnosis Date Noted   Atrial fibrillation with RVR 08/18/2022   Aortic dissection 08/17/2022   Aortic dissection distal to left subclavian 08/17/2022   Gastroenteritis 04/19/2022   Diverticulosis 04/19/2022   Diverticulosis of sigmoid colon 04/19/2022   Palpitations 06/24/2021   Premature atrial contraction 06/24/2021   PVC (premature ventricular contraction) 06/24/2021   Abnormal EKG 06/24/2021   Full body hives 05/05/2020   Dyslipidemia 03/24/2018   Postmenopausal estrogen deficiency 03/24/2018   Squamous cell carcinoma 08/10/2017   HSV-1 (herpes simplex virus 1) infection 03/16/2017   Osteoporosis 04/15/2016   Vulvar atrophy 11/07/2014  Abnormal Pap smear of cervix 11/07/2014   PCP:  Susy Frizzle, MD Pharmacy:   [facility] [place]), [place] - [address] [place] [address] Phone: [phone]  Fax: [phone]  [facility], [place] to [facility] [address] [place] [address] Phone: [phone] Fax: [place], [place] - [place] [address] [place] [address] [place] [address] Phone: [phone] Fax: [phone]  [place] [address] Phone: [phone] Fax: [phone]     Social Determinants of Health (SDOH) Social History: SDOH Screenings   Food Insecurity: No Food Insecurity (08/17/2022)  Housing: Low Risk  (08/17/2022)  Transportation Needs: No Transportation Needs (08/09/2022)  Alcohol Screen: Low Risk  (08/09/2022)  Depression (PHQ2-9): Low Risk  (06/11/2022)  Financial Resource Strain: Low Risk  (08/09/2022)  Physical Activity: Unknown (08/09/2022)  Social Connections: Moderately Integrated (08/09/2022)  Stress: No Stress Concern Present (08/09/2022)  Tobacco Use: Low Risk  (08/17/2022)   SDOH Interventions: Food Insecurity Interventions: Intervention Not Indicated   Readmission Risk Interventions     No data to display

## 2022-08-19 NOTE — Consult Note (Addendum)
Cardiology Consultation   Patient ID: Terri JACQUART MRN: [id number]; DOB: 01/07/1951  Admit date: 08/17/2022 Date of Consult: 08/19/2022  PCP:  Terri Frizzle, MD   [facility] Cardiologist:  None        Patient Profile:   Terri Alexander is a 72 y.o. female with a hx of atrial fibrillation, hyperlipidemia, GERD who is being seen 08/19/2022 for the evaluation of paroxysmal atrial fibrillation at the request of Dr. Shelly Coss.  History of Present Illness:   [name] presented to the [facility] emergency department on 4/2 for evaluation of acute onset pleuritic chest pain and shortness of breath.    Patient was recently seen by her PCP on 3/25 and found to have atrial fibrillation with rapid ventricular response.  She was seeing her PCP as a follow-up for ongoing chest congestion, cough, fatigue, and shortness of breath which had been present since she had COVID earlier in the year.  In office EKG on that day noted atrial fibrillation with a heart rate of 140 bpm.  Per this office visit note, patient also noted to have pitting edema and bibasilar crackles.  Based on office visit note, patient appears to have been started on Eliquis 5 mg twice daily, Toprol-XL 50 mg, and was also given Lasix 40 mg daily due to pulmonary edema.  She was seen in follow-up on 3/28 and noted to continue to have rapid ventricular response with heart rate between 120 and 130.  Metoprolol was increased to 100 mg and Lasix was decreased to 20 mg.  Even with this increase, patient continued to have elevated heart rate and on 4/1 she was given Cardizem 180 mg.  However it appears due to unavailability at pharmacy, patient was not able to start this.  On 4/2 a MyChart message indicates that patient was continuing to feel unwell and had a blood pressure of 82/60mmHg. She was advised to proceed to the ED.   At [facility] ED, labs notable for leukocytosis to 18,000, BNP 382.  Troponin was negative and flat,  12-12.  As a part of her workup patient had a CTA of chest/abdomen/pelvis and this revealed a thoracic aortic aneurysm 5.6 cm with suspected thrombosed chronic type B dissection extending from distal large to the level of T5-T6.  Given this finding she was started on a Cardene drip and given IV Cardizem and subsequently transferred to [facility] for vascular surgery evaluation.    Upon arrival to [facility], patient was initially continued on esmolol and Cardene and transitioned to oral diltiazem.  Anticoagulation held pending vascular evaluation.  Patient was seen by Dr. Doren Alexander with vascular surgery.  Per his note, thoracic aortic aneurysm versus chronic type B aortic dissection with thrombosed lumen.  Given that this did not appear to be acute, recommendation was for follow-up CT angiography of the chest in 6 months.  On the evening of 4/2, patient's primary team called to bedside for hypotension and worsening pain.  It appears that she had cardioverted around shift change with subsequently reduced BP.  Esmolol and Cardene infusions held.  On 4/3, patient was initiated on Eliquis 5 mg twice daily given no acute vascular surgery plans.  Cardiology was also consulted for assistance with rate and rhythm control.  Patient initially seen by Dr. Virgina Alexander with [facility] who recommended adding diltiazem sustained-release 60 mg twice daily.  Patient subsequently requested that further cardiac care be managed by [facility].  Today patient reports that she is feeling  better. Continues to have a chronic cough that exacerbates chest discomfort. Also states that pain worsens with deep breathing. There does not appear to be an exertional component to chest pain. Denies palpitations today as well.  Past Medical History:  Diagnosis Date   Adenomatous colon polyp    Allergy    Anemia    as a child, no current problem   Atrial fibrillation    Blood transfusion without reported diagnosis    27 months old    Cataract    bilateral - MD just watching    GERD (gastroesophageal reflux disease)    Hyperlipidemia    Osteoporosis 04/15/2016   Skin cancer 2013   Skin cancer- Nose, face    Past Surgical History:  Procedure Laterality Date   CESAREAN SECTION  1981   x 1   COLONOSCOPY  2016   hx polyp, tics, sigmoid colon mod tortuous Dr Boyd Kerbs,    EYE SURGERY Bilateral    retina repair x 2   LAPAROTOMY  1979   removal of ovarian cyst   nissan fundiplication  [id number]   REPAIR OF COMPLEX TRACTION RETINAL DETACHMENT Bilateral 01/2019     Home Medications:  Prior to Admission medications   Medication Sig Start Date End Date Taking? Authorizing Provider  alendronate (FOSAMAX) 70 MG tablet TAKE 1 TABLET BY MOUTH ONCE A WEEK ON AN EMPTY STOMACH WITH A FULL GLASS OF WATER 06/28/22  Yes Terri Frizzle, MD  Biotin 1000 MCG tablet Take 1,000 mcg by mouth daily.   Yes [provider]  Calcium Carb-Cholecalciferol (CALCIUM 600 + D PO) Take 600 mg by mouth once.   Yes [provider]  chlorpheniramine (CHLOR-TRIMETON) 4 MG tablet Take 4 mg by mouth 2 (two) times daily as needed for allergies.   Yes [provider]  Cholecalciferol (VITAMIN D) 50 MCG (2000 UT) CAPS Take 2,000 Units by mouth daily.   Yes [provider]  co-enzyme Q-10 30 MG capsule Take 30 mg by mouth 3 (three) times daily.   Yes [provider]  Cranberry-Cholecalciferol 4200-500 MG-UNIT CAPS Take 1 tablet by mouth daily.   Yes [provider]  docusate sodium (COLACE) 250 MG capsule Take 250 mg by mouth daily.   Yes [provider]  furosemide (LASIX) 40 MG tablet Take 1 tablet (40 mg total) by mouth daily. 08/09/22  Yes Terri Frizzle, MD  Methylcellulose, Laxative, (FIBER THERAPY PO) Take 1 Scoop by mouth daily.   Yes [provider]  metoprolol succinate (TOPROL-XL) 50 MG 24 hr tablet Take 1 tablet (50 mg total) by mouth daily. Take with or immediately  following a meal. 08/09/22  Yes Pickard, Cammie Mcgee, MD  Multiple Vitamin (MULTIVITAMIN) tablet Take 1 tablet by mouth daily.   Yes [provider]  Probiotic Product (PROBIOTIC PO) Take 1 tablet by mouth daily.   Yes [provider]  vitamin C (ASCORBIC ACID) 500 MG tablet Take 500 mg by mouth daily.   Yes [provider]  diltiazem (CARDIZEM LA) 180 MG 24 hr tablet Take 1 tablet (180 mg total) by mouth daily. Patient not taking: Reported on 08/18/2022 08/17/22   Terri Frizzle, MD    Inpatient Medications: Scheduled Meds:  apixaban  5 mg Oral BID   Chlorhexidine Gluconate Cloth  6 each Topical Daily   diltiazem  60 mg Oral Q12H   fluticasone  2 puff Inhalation BID   metoprolol tartrate  25 mg Oral BID  Continuous Infusions:  sodium chloride Stopped (08/19/22 1209)   cefTRIAXone (ROCEPHIN)  IV Stopped (08/19/22 1201)   metronidazole 100 mL/hr at 08/19/22 1300   PRN Meds: acetaminophen, docusate sodium, ondansetron (ZOFRAN) IV, mouth rinse, polyethylene glycol  Allergies:   No Known Allergies  Social History:   Social History   Socioeconomic History   Marital status: Married    Spouse name: Not on file   Number of children: Not on file   Years of education: Not on file   Highest education level: 12th grade  Occupational History   Not on file  Tobacco Use   Smoking status: Never   Smokeless tobacco: Never  Vaping Use   Vaping Use: Never used  Substance and Sexual Activity   Alcohol use: Yes    Alcohol/week: 1.0 standard drink of alcohol    Types: 1 Glasses of wine per week    Comment: rarely   Drug use: No   Sexual activity: Not Currently    Birth control/protection: Post-menopausal  Other Topics Concern   Not on file  Social History Narrative   Not on file   Social Determinants of Health   Financial Resource Strain: Low Risk  (08/09/2022)   Overall Financial Resource Strain (CARDIA)    Difficulty of Paying Living Expenses: Not hard at  all  Food Insecurity: No Food Insecurity (08/17/2022)   Hunger Vital Sign    Worried About Running Out of Food in the Last Year: Never true    Ran Out of Food in the Last Year: Never true  Transportation Needs: No Transportation Needs (08/09/2022)   PRAPARE - Hydrologist (Medical): No    Lack of Transportation (Non-Medical): No  Physical Activity: Unknown (08/09/2022)   Exercise Vital Sign    Days of Exercise per Week: 3 days    Minutes of Exercise per Session: Not on file  Stress: No Stress Concern Present (08/09/2022)   [place]    Feeling of Stress : Only a little  Social Connections: Moderately Integrated (08/09/2022)   Social Connection and Isolation Panel [NHANES]    Frequency of Communication with Friends and Family: Three times a week    Frequency of Social Gatherings with Friends and Family: Twice a week    Attends Religious Services: 1 to 4 times per year    Active Member of [employer] or Organizations: No    Attends Music therapist: Not on file    Marital Status: Married  Human resources officer Violence: Not on file    Family History:    Family History  Problem Relation Age of Onset   Arthritis Mother    Heart disease Mother    Vision loss Mother    Stroke Mother    Depression Sister    Transient ischemic attack Sister 26   Depression Sister    Cancer Maternal Grandmother    Cancer Maternal Grandfather    Early death Maternal Grandfather    Heart disease Maternal Grandfather    Early death Paternal Grandfather    Heart disease Paternal Grandfather    Diabetes Other        Juvenile   Colon cancer Neg Hx    Rectal cancer Neg Hx    Prostate cancer Neg Hx      ROS:  Please see the history of present illness.   All other ROS reviewed and negative.     Physical Exam/Data:   Vitals:  08/19/22 1135 08/19/22 1146 08/19/22 1200 08/19/22 1300  BP: 123/76  130/62  112/60  Pulse: (!) 109  (!) 124 (!) 119  Resp:   20   Temp:  (!) 96.6 F (35.9 C)    TempSrc:  Axillary    SpO2: 98%  96% 98%  Weight:      Height:        Intake/Output Summary (Last 24 hours) at 08/19/2022 1441 Last data filed at 08/19/2022 1300 Gross per 24 hour  Intake 320.65 ml  Output --  Net 320.65 ml      08/19/2022    5:00 AM 08/18/2022    7:03 AM 08/17/2022   11:09 AM  Last 3 Weights  Weight (lbs) 128 lb 12 oz 122 lb 12.7 oz 127 lb  Weight (kg) 58.4 kg 55.7 kg 57.607 kg     Body mass index is 23.55 kg/m.  General:  Well nourished, well developed, in no acute distress HEENT: normal Neck: no JVD Vascular: No carotid bruits; Distal pulses 2+ bilaterally Cardiac:  normal S1, S2; irregularly irregular; no murmur  Lungs:  clear to auscultation bilaterally, no wheezing, rhonchi or rales  Abd: soft, nontender, no hepatomegaly  Ext: no edema Musculoskeletal:  No deformities, BUE and BLE strength normal and equal Skin: warm and dry  Neuro:  CNs 2-12 intact, no focal abnormalities noted Psych:  Normal affect   EKG:  The EKG was personally reviewed and demonstrates: No tracing since 4/2.  This EKG shows atrial fibrillation with RVR, ventricular rates in the 140s. Telemetry:  Telemetry was personally reviewed and demonstrates:  atrial fibrillation, rates between 80-110bpm  Relevant CV Studies:  08/18/22 TTE  IMPRESSIONS     1. Left ventricular ejection fraction, by estimation, is 60 to 65%. The  left ventricle has normal function. The left ventricle has no regional  wall motion abnormalities. Left ventricular diastolic function could not  be evaluated.   2. Right ventricular systolic function is normal. The right ventricular  size is normal. There is normal pulmonary artery systolic pressure. The  estimated right ventricular systolic pressure is [id number] mmHg.   3. Left atrial size was mild to moderately dilated.   4. Right atrial size was mildly dilated.   5. The mitral  valve is normal in structure. Mild mitral valve  regurgitation. No evidence of mitral stenosis.   6. Tricuspid valve regurgitation is mild to moderate.   7. The aortic valve is tricuspid. Aortic valve regurgitation is not  visualized. No aortic stenosis is present.   8. The inferior vena cava is dilated in size with >50% respiratory  variability, suggesting right atrial pressure of 8 mmHg.   FINDINGS   Left Ventricle: Left ventricular ejection fraction, by estimation, is 60  to 65%. The left ventricle has normal function. The left ventricle has no  regional wall motion abnormalities. The left ventricular internal cavity  size was normal in size. There is   no left ventricular hypertrophy. Left ventricular diastolic function  could not be evaluated due to atrial fibrillation. Left ventricular  diastolic function could not be evaluated.   Right Ventricle: The right ventricular size is normal. No increase in  right ventricular wall thickness. Right ventricular systolic function is  normal. There is normal pulmonary artery systolic pressure. The tricuspid  regurgitant velocity is 2.15 m/s, and   with an assumed right atrial pressure of 8 mmHg, the estimated right  ventricular systolic pressure is [id number] mmHg.   Left Atrium: Left atrial  size was mild to moderately dilated.   Right Atrium: Right atrial size was mildly dilated.   Pericardium: There is no evidence of pericardial effusion.   Mitral Valve: The mitral valve is normal in structure. Mild mitral valve  regurgitation, with centrally-directed jet. No evidence of mitral valve  stenosis.   Tricuspid Valve: The tricuspid valve is normal in structure. Tricuspid  valve regurgitation is mild to moderate. No evidence of tricuspid  stenosis.   Aortic Valve: The aortic valve is tricuspid. Aortic valve regurgitation is  not visualized. No aortic stenosis is present. Aortic valve mean gradient  measures 3.0 mmHg. Aortic valve peak  gradient measures 5.6 mmHg. Aortic  valve area, by VTI measures 2.14  cm.   Pulmonic Valve: The pulmonic valve was normal in structure. Pulmonic valve  regurgitation is not visualized. No evidence of pulmonic stenosis.   Aorta: The aortic root is normal in size and structure.   Venous: The inferior vena cava is dilated in size with greater than 50%  respiratory variability, suggesting right atrial pressure of 8 mmHg.   IAS/Shunts: No atrial level shunt detected by color flow Doppler.    Laboratory Data:  High Sensitivity Troponin:   Recent Labs  Lab 08/17/22 1230 08/17/22 1415 08/17/22 2105 08/17/22 2231  TROPONINIHS 7 7 12 12      Chemistry Recent Labs  Lab 08/17/22 1229 08/18/22 0301 08/19/22 0056  NA 139 138 140  K 3.9 3.9 3.5  CL 102 106 106  CO2 26 21* 22  GLUCOSE 100* 128* 105*  BUN 21 21 20   CREATININE 0.64 0.78 0.74  CALCIUM 9.8 8.5* 8.4*  MG  --  2.1 2.1  GFRNONAA >60 >60 >60  ANIONGAP 11 11 12     No results for input(s): "PROT", "ALBUMIN", "AST", "ALT", "ALKPHOS", "BILITOT" in the last 168 hours. Lipids No results for input(s): "CHOL", "TRIG", "HDL", "LABVLDL", "LDLCALC", "CHOLHDL" in the last 168 hours.  Hematology Recent Labs  Lab 08/17/22 1229 08/17/22 2105 08/18/22 0301 08/19/22 0056  WBC 18.6*  --  19.2* 11.6*  RBC 4.61  --  4.00 3.98  HGB 13.7 11.5* 12.0 11.8*  HCT 41.0 35.0* 36.0 35.0*  MCV 88.9  --  90.0 87.9  MCH 29.7  --  30.0 29.6  MCHC 33.4  --  33.3 33.7  RDW 15.3  --  15.2 14.9  PLT 345  --  355 295   Thyroid No results for input(s): "TSH", "FREET4" in the last 168 hours.  BNP Recent Labs  Lab 08/17/22 1229  BNP 382.1*    DDimer No results for input(s): "DDIMER" in the last 168 hours.   Radiology/Studies:  ECHOCARDIOGRAM COMPLETE  Result Date: 08/18/2022    ECHOCARDIOGRAM REPORT   Patient Name:   TAILEE WILLCUTT Date of Exam: 08/18/2022 Medical Rec #:  [id number]      Height:       62.0 in Accession #:    [id number]      Weight:       122.8 lb Date of Birth:  06-12-1950      BSA:          1.554 m Patient Age:    66 years       BP:           120/74 mmHg Patient Gender: F              HR:           111 bpm. Exam Location:  Inpatient  Procedure: 2D Echo, Cardiac Doppler and Color Doppler Indications:    Atrial Fibrillation I48.91  History:        Patient has prior history of Echocardiogram examinations, most                 recent 06/26/2021. Risk Factors:Dyslipidemia.  Sonographer:    Ronny Flurry Referring Phys: [id number] [place]  1. Left ventricular ejection fraction, by estimation, is 60 to 65%. The left ventricle has normal function. The left ventricle has no regional wall motion abnormalities. Left ventricular diastolic function could not be evaluated.  2. Right ventricular systolic function is normal. The right ventricular size is normal. There is normal pulmonary artery systolic pressure. The estimated right ventricular systolic pressure is A999333 mmHg.  3. Left atrial size was mild to moderately dilated.  4. Right atrial size was mildly dilated.  5. The mitral valve is normal in structure. Mild mitral valve regurgitation. No evidence of mitral stenosis.  6. Tricuspid valve regurgitation is mild to moderate.  7. The aortic valve is tricuspid. Aortic valve regurgitation is not visualized. No aortic stenosis is present.  8. The inferior vena cava is dilated in size with >50% respiratory variability, suggesting right atrial pressure of 8 mmHg. FINDINGS  Left Ventricle: Left ventricular ejection fraction, by estimation, is 60 to 65%. The left ventricle has normal function. The left ventricle has no regional wall motion abnormalities. The left ventricular internal cavity size was normal in size. There is  no left ventricular hypertrophy. Left ventricular diastolic function could not be evaluated due to atrial fibrillation. Left ventricular diastolic function could not be evaluated. Right Ventricle: The right  ventricular size is normal. No increase in right ventricular wall thickness. Right ventricular systolic function is normal. There is normal pulmonary artery systolic pressure. The tricuspid regurgitant velocity is 2.15 m/s, and  with an assumed right atrial pressure of 8 mmHg, the estimated right ventricular systolic pressure is [id number] mmHg. Left Atrium: Left atrial size was mild to moderately dilated. Right Atrium: Right atrial size was mildly dilated. Pericardium: There is no evidence of pericardial effusion. Mitral Valve: The mitral valve is normal in structure. Mild mitral valve regurgitation, with centrally-directed jet. No evidence of mitral valve stenosis. Tricuspid Valve: The tricuspid valve is normal in structure. Tricuspid valve regurgitation is mild to moderate. No evidence of tricuspid stenosis. Aortic Valve: The aortic valve is tricuspid. Aortic valve regurgitation is not visualized. No aortic stenosis is present. Aortic valve mean gradient measures 3.0 mmHg. Aortic valve peak gradient measures 5.6 mmHg. Aortic valve area, by VTI measures 2.14 cm. Pulmonic Valve: The pulmonic valve was normal in structure. Pulmonic valve regurgitation is not visualized. No evidence of pulmonic stenosis. Aorta: The aortic root is normal in size and structure. Venous: The inferior vena cava is dilated in size with greater than 50% respiratory variability, suggesting right atrial pressure of 8 mmHg. IAS/Shunts: No atrial level shunt detected by color flow Doppler.  LEFT VENTRICLE PLAX 2D LVIDd:         3.80 cm   Diastology LVIDs:         2.50 cm   LV e' medial:    9.95 cm/s LV PW:         1.10 cm   LV E/e' medial:  11.2 LV IVS:        0.80 cm   LV e' lateral:   16.20 cm/s LVOT diam:     1.70 cm   LV E/e' lateral:  6.9 LV SV:         41 LV SV Index:   27 LVOT Area:     2.27 cm  RIGHT VENTRICLE             IVC RV S prime:     11.50 cm/s  IVC diam: 2.30 cm TAPSE (M-mode): 1.6 cm LEFT ATRIUM             Index        RIGHT  ATRIUM           Index LA diam:        4.00 cm 2.57 cm/m   RA Area:     17.50 cm LA Vol (A2C):   39.3 ml 25.29 ml/m  RA Volume:   43.10 ml  27.74 ml/m LA Vol (A4C):   53.9 ml 34.69 ml/m LA Biplane Vol: 51.3 ml 33.02 ml/m  AORTIC VALVE AV Area (Vmax):    2.22 cm AV Area (Vmean):   2.09 cm AV Area (VTI):     2.14 cm AV Vmax:           118.00 cm/s AV Vmean:          82.500 cm/s AV VTI:            0.193 m AV Peak Grad:      5.6 mmHg AV Mean Grad:      3.0 mmHg LVOT Vmax:         115.33 cm/s LVOT Vmean:        75.867 cm/s LVOT VTI:          0.182 m LVOT/AV VTI ratio: 0.94  AORTA Ao Root diam: 3.40 cm Ao Asc diam:  3.40 cm MR Peak grad: 87.4 mmHg     TRICUSPID VALVE MR Mean grad: 61.0 mmHg     TR Peak grad:   18.5 mmHg MR Vmax:      467.33 cm/s   TR Vmax:        215.00 cm/s MR Vmean:     375.0 cm/s MV E velocity: 111.00 cm/s  SHUNTS                             Systemic VTI:  0.18 m                             Systemic Diam: 1.70 cm Dani Gobble Croitoru MD Electronically signed by Sanda Klein MD Signature Date/Time: 08/18/2022/3:12:15 PM    Final    CT Angio Chest/Abd/Pel for Dissection W and/or Wo Contrast  Result Date: 08/17/2022 CLINICAL DATA:  Shortness of breath and one-week history of tachycardia associated with headache EXAM: CT ANGIOGRAPHY CHEST, ABDOMEN AND PELVIS TECHNIQUE: Non-contrast CT of the chest was initially obtained. Multidetector CT imaging through the chest, abdomen and pelvis was performed using the standard protocol during bolus administration of intravenous contrast. Multiplanar reconstructed images and MIPs were obtained and reviewed to evaluate the vascular anatomy. RADIATION DOSE REDUCTION: This exam was performed according to the departmental dose-optimization program which includes automated exposure control, adjustment of the mA and/or kV according to patient size and/or use of iterative reconstruction technique. CONTRAST:  51mL OMNIPAQUE IOHEXOL 350 MG/ML SOLN COMPARISON:  CT abdomen  and pelvis dated 12/18/2020 FINDINGS: CTA CHEST FINDINGS Cardiovascular: Preferential opacification of the thoracic aorta. Ascending aorta measures 4.2 x 4.0 cm. The proximal descending aorta measures 5.6 x 4.8  cm. Irregular hypoattenuating noncalcified thrombus is seen extending from the level of the distal arch immediately distal to the left subclavian artery origin to the level of the diaphragmatic hiatus. At the proximal aspect, there is slight displacement of the intimal calcification, suggestive of dissection (8:14) with thrombosis of the false lumen, extending to the level T5-6. Normal heart size. No pericardial effusion. Mediastinum/Nodes: Imaged thyroid gland without nodules meeting criteria for imaging follow-up by size. Small hiatal hernia. No pathologically enlarged axillary, supraclavicular, mediastinal, or hilar lymph nodes. Lungs/Pleura: The central airways are patent. Bilateral lower lobe relaxation atelectasis. 3 mm ground-glass nodule along the right minor fissure (7:66). No pneumothorax. Trace right and small left pleural effusions. Musculoskeletal: No acute or abnormal lytic or blastic osseous lesions. Review of the MIP images confirms the above findings. CTA ABDOMEN AND PELVIS FINDINGS VASCULAR Aorta: Normal caliber aorta without aneurysm, dissection, vasculitis or significant stenosis. Celiac: Patent without evidence of aneurysm, dissection, vasculitis or significant stenosis. SMA: Patent without evidence of aneurysm, dissection, vasculitis or significant stenosis. Renals: Single right and 2 left renal arteries. The left renal arteries arise from the distal abdominal aorta, inferior to the IMA takeoff and from the proximal right common iliac artery. The renal arteries are patent without evidence of aneurysm, dissection, vasculitis, fibromuscular dysplasia or significant stenosis. IMA: Patent without evidence of aneurysm, dissection, vasculitis or significant stenosis. Inflow: Patent without  evidence of aneurysm, dissection, vasculitis or significant stenosis. Proximal Outflow: Bilateral common femoral and visualized portions of the superficial and profunda femoral arteries are patent without evidence of aneurysm, dissection, vasculitis or significant stenosis. Veins: No obvious venous abnormality within the limitations of this arterial phase study. Review of the MIP images confirms the above findings. NON-VASCULAR Hepatobiliary: No focal hepatic lesions. No intra or extrahepatic biliary ductal dilation. Normal gallbladder. Pancreas: No focal lesions or main ductal dilation. Spleen: Normal in size without focal abnormality. Adrenals/Urinary Tract: No adrenal nodules. Pelvic left kidney. No suspicious renal mass, calculi or hydronephrosis. Bilateral simple cysts. No specific follow-up imaging recommended. no focal bladder wall thickening. Stomach/Bowel: Normal appearance of the stomach. Mural thickening of the rectum. Sigmoid diverticulosis. Appendix is not discretely seen. Lymphatic: No enlarged abdominal or pelvic lymph nodes. Reproductive: No adnexal masses. Other: Hyperattenuating small volume pelvic free fluid. No free air. Musculoskeletal: No acute or abnormal lytic or blastic osseous lesions. Review of the MIP images confirms the above findings. IMPRESSION: VASCULAR: 1. 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 Recommend continued management and follow-up per vascular surgery. NONVASCULAR: 1. Trace right and small left pleural effusions. 2. Nonspecific 3 mm ground-glass nodule along the right minor fissure. No follow-up needed if patient is low-risk.This recommendation follows the consensus statement: Guidelines for Management of Incidental Pulmonary Nodules Detected on CT Images: From the Fleischner Society 2017; Radiology 2017; 284:228-243.  3. Small volume hyperattenuating pelvic free fluid, suspicious for hemorrhage adjacent to mural thickening of the rectum, which may represent proctitis. 4. Appendix is not discretely seen. 5. Pelvic left kidney. These results were called by telephone at the time of interpretation on 08/17/2022 at 2:10 pm to provider [facility] , who verbally acknowledged these results. Electronically Signed   By: Darrin Nipper M.D.   On: 08/17/2022 14:43   CT Angio Chest PE W/Cm &/Or Wo Cm  Result Date: 08/17/2022 CLINICAL DATA:  Headache, AFib, concern for pulmonary embolism.  EXAM: CT ANGIOGRAPHY CHEST WITH CONTRAST TECHNIQUE: Multidetector CT imaging of the chest was performed using the standard protocol during bolus administration of intravenous contrast. Multiplanar CT image reconstructions and MIPs were obtained to evaluate the vascular anatomy. RADIATION DOSE REDUCTION: This exam was performed according to the departmental dose-optimization program which includes automated exposure control, adjustment of the mA and/or kV according to patient size and/or use of iterative reconstruction technique. CONTRAST:  26mL OMNIPAQUE IOHEXOL 350 MG/ML SOLN COMPARISON:  No direct comparison study available. CT abdomen/pelvis 12/18/2020 FINDINGS: Cardiovascular: There is adequate opacification of the pulmonary arteries to the segmental level. There is no evidence of pulmonary embolism. The ascending thoracic aorta is aneurysmal measuring up to 4.1 cm. The arch measures up to 4.8 cm in the sagittal plane (8-95). There is crescentic hypodensity along the posterior wall of the aortic arch and descending thoracic aorta which may reflect dissection/intramural hematoma or noncalcified plaque. Curvilinear hyperdense material within this hypodensity is consistent with calcification. The thoracic aorta remains patent though is suboptimally opacified. The heart size is normal. There is no pericardial effusion. There is reflux of contrast into the IVC.  Mediastinum/Nodes: The imaged thyroid is unremarkable. The esophagus is grossly unremarkable. There is a small hiatal hernia. There is no mediastinal, axillary, or hilar lymphadenopathy. Lungs/Pleura: The trachea and central airways are patent. There is a small right posterolateral diverticulum in the upper trachea (6-16). There are small bilateral pleural effusions with adjacent atelectasis. There is no other focal airspace opacity. There is no pulmonary edema. There is no pneumothorax There are no suspicious nodules. Upper Abdomen: The imaged portions of the upper abdominal viscera are unremarkable. Musculoskeletal: There is no acute osseous abnormality or suspicious osseous lesion. Review of the MIP images confirms the above findings. IMPRESSION: 1. Aneurysmal ascending thoracic aorta and aortic arch measuring up to 4.8 cm at the arch with crescentic hypodensity along the posterior wall throughout the thoracic aorta beginning at the arch just beyond the origin of the brachiocephalic artery extending to the lower thoracic aorta which may reflect dissection/acute intramural hematoma or noncalcified plaque. Recommend vascular surgery consultation due to size and dissection protocol CTA of the chest. 2. No evidence of pulmonary embolism. 3. Reflux of contrast into the IVC suggestive of right heart dysfunction. 4. Small bilateral pleural effusions. These results were called by telephone at the time of interpretation on 08/17/2022 at 1:35 pm to provider [facility] , who verbally acknowledged these results. Electronically Signed   By: Valetta Mole M.D.   On: 08/17/2022 13:41     Assessment and Plan:   Paroxysmal atrial fibrillation with rapid ventricular response CHA2DS2-VASc Score = 3  Patient with newly discovered atrial fibrillation with RVR per PCP on 3/25.  Generally failed rate control in outpatient setting, and presented to the emergency department with chest discomfort.  Found to be in ongoing atrial  fibrillation with rapid ventricular response.  Patient with ongoing paroxysmal atrial fibrillation this admission. Rates are fluctuant on telemetry but appear to be more consistent in the 90s today. Patient is feeling better at this rate. Suspect that patient's fluid accumulation in recent weeks was primarily a result of afib with RVR and loss of atrial kick. LVEF reassuringly normal on echo and no significant valvular abnormalities. Long-term would prefer restoration of NSR. Would recommend rate control with 3 weeks of uninterrupted anticoagulation and referral to afib clinic with tentative plans for DCCV if patient remains in afib.  Continue Metoprolol Tartrate 25mg  BID. Continue Cardizem SR  60mg  BID. Could consider PRN extra dose of Metoprolol for breakthrough RVR.  Thoracic aortic aneurysm vs chronic type B aortic dissection  Patient with CTA chest/abd/pelvis revealing laminated thrombus within thoracic aortic aneurysm. Per vascular surgery, no acute management needs. She is scheduled for 6 month follow up/monitoring with Dr. Scot Dock.  Risk Assessment/Risk Scores:          CHA2DS2-VASc Score = 3   This indicates a 3.2% annual risk of stroke. The patient's score is based upon: CHF History: 0 HTN History: 0 Diabetes History: 0 Stroke History: 0 Vascular Disease History: 1 Age Score: 1 Gender Score: 1         For questions or updates, please contact [facility] Please consult [email or web address] for contact info under    Signed, Lily Kocher, PA-C  08/19/2022 2:41 PM

## 2022-08-20 ENCOUNTER — Other Ambulatory Visit (HOSPITAL_COMMUNITY): Payer: Self-pay

## 2022-08-20 ENCOUNTER — Other Ambulatory Visit (HOSPITAL_BASED_OUTPATIENT_CLINIC_OR_DEPARTMENT_OTHER): Payer: Self-pay

## 2022-08-20 DIAGNOSIS — I71019 Dissection of thoracic aorta, unspecified: Secondary | ICD-10-CM | POA: Diagnosis not present

## 2022-08-20 LAB — BASIC METABOLIC PANEL
Anion gap: 12 (ref 5–15)
BUN: 15 mg/dL (ref 8–23)
CO2: 21 mmol/L — ABNORMAL LOW (ref 22–32)
Calcium: 8.7 mg/dL — ABNORMAL LOW (ref 8.9–10.3)
Chloride: 104 mmol/L (ref 98–111)
Creatinine, Ser: 0.68 mg/dL (ref 0.44–1.00)
GFR, Estimated: >60 mL/min (ref >60)
Glucose, Bld: 99 mg/dL (ref 70–99)
Potassium: 3.8 mmol/L (ref 3.5–5.1)
Sodium: 137 mmol/L (ref 135–145)

## 2022-08-20 LAB — CBC
HCT: 36.8 % (ref 36.0–46.0)
Hemoglobin: 12.5 g/dL (ref 12.0–15.0)
MCH: 29.7 pg (ref 26.0–34.0)
MCHC: 34 g/dL (ref 30.0–36.0)
MCV: 87.4 fL (ref 80.0–100.0)
Platelets: 391 10*3/uL (ref 150–400)
RBC: 4.21 MIL/uL (ref 3.87–5.11)
RDW: 14.9 % (ref 11.5–15.5)
WBC: 10.4 10*3/uL (ref 4.0–10.5)
nRBC: 0 % (ref 0.0–0.2)

## 2022-08-20 LAB — MAGNESIUM: Magnesium: 2.1 mg/dL (ref 1.7–2.4)

## 2022-08-20 LAB — PHOSPHORUS: Phosphorus: 3.1 mg/dL (ref 2.5–4.6)

## 2022-08-20 MED ORDER — DILTIAZEM HCL ER 60 MG PO CP12
60.0000 mg | ORAL_CAPSULE | Freq: Two times a day (BID) | ORAL | 1 refills | Status: DC
  Filled 2022-08-20 (×2): qty 60, 30d supply, fill #0

## 2022-08-20 MED ORDER — DILTIAZEM HCL ER 60 MG PO CP12
60.0000 mg | ORAL_CAPSULE | Freq: Two times a day (BID) | ORAL | 1 refills | Status: DC
  Filled 2022-08-20: qty 60, 30d supply, fill #0

## 2022-08-20 MED ORDER — APIXABAN 5 MG PO TABS
5.0000 mg | ORAL_TABLET | Freq: Two times a day (BID) | ORAL | 3 refills | Status: DC
  Filled 2022-08-20 (×2): qty 60, 30d supply, fill #0

## 2022-08-20 MED ORDER — METOPROLOL TARTRATE 25 MG PO TABS
25.0000 mg | ORAL_TABLET | Freq: Two times a day (BID) | ORAL | 1 refills | Status: DC
  Filled 2022-08-20: qty 60, 30d supply, fill #0

## 2022-08-20 NOTE — Discharge Instructions (Signed)
Advised to follow-up with primary care physician in 1 week. Advised to follow-up with cardiology (atrial fibrillation clinic in 2 weeks) Advised to take metoprolol 25 mg twice daily, Cardizem 60 mg every 12 hours for atrial fibrillation Advised to take Eliquis 5 mg twice daily for anticoagulation for A-fib. Advised to take additional dose of metoprolol for heart rate not controlled.

## 2022-08-20 NOTE — TOC Transition Note (Signed)
Transition of Care [facility]) - CM/SW Discharge Note   Patient Details  Name: Terri Alexander MRN: [id number] Date of Birth: 1951/04/27  Transition of Care [facility]) CM/SW Contact:  Elliot Cousin, RN Phone Number: [phone] 08/20/2022, 4:04 PM   Clinical Narrative:    CM spoke to pt and husband and no dc needs identified. Husband will provide transportation home.    Final next level of care: Home/Self Care Barriers to Discharge: No Barriers Identified   Patient Goals and CMS Choice CMS Medicare.gov Compare Post Acute Care list provided to:: Patient    Discharge Placement                         Discharge Plan and Services Additional resources added to the After Visit Summary for     Discharge Planning Services: CM Consult                                 Social Determinants of Health (SDOH) Interventions SDOH Screenings   Food Insecurity: No Food Insecurity (08/17/2022)  Housing: Low Risk  (08/17/2022)  Transportation Needs: No Transportation Needs (08/09/2022)  Alcohol Screen: Low Risk  (08/09/2022)  Depression (PHQ2-9): Low Risk  (06/11/2022)  Financial Resource Strain: Low Risk  (08/09/2022)  Physical Activity: Unknown (08/09/2022)  Social Connections: Moderately Integrated (08/09/2022)  Stress: No Stress Concern Present (08/09/2022)  Tobacco Use: Low Risk  (08/17/2022)     Readmission Risk Interventions     No data to display

## 2022-08-20 NOTE — Progress Notes (Signed)
Pt discharged home with husband. Discharge instructions given. Follow up appointments explained. Medications delivered from [facility].  Vital signs stable. All questions answered. Ruben Gottron RN

## 2022-08-20 NOTE — Discharge Summary (Signed)
Physician Discharge Summary  Terri Alexander ZOX:[id number] DOB: [birth year] DOA: 08/17/2022  PCP: Donita Brooks, MD  Admit date: 08/17/2022  Discharge date: 08/20/2022  Admitted From: Home.  Disposition:  Home.  Recommendations for Outpatient Follow-up:  Follow up with PCP in 1-2 weeks. Please obtain BMP/CBC in one week. Advised to follow-up with Cardiology (atrial fibrillation clinic in 2 weeks). Advised to take metoprolol 25 mg twice daily, Cardizem 60 mg every 12 hours for atrial fibrillation Advised to take Eliquis 5 mg twice daily for anticoagulation for A-fib. Advised to take additional dose of metoprolol for heart rate, not controlled.  Home Health:None Equipment/Devices:None  Discharge Condition: Stable CODE STATUS:Full code Diet recommendation: Heart Healthy   Brief [facility] Course: This 72 yrs old female with PMH significant for atrial fibrillation, Hyperlipidemia, GERD and recent Covid (1 month ago) who presented to the ED with the acute onset of pleuritic chest pain and shortness of breath. She was recently diagnosed with atrial fibrillation and started on toprol XL and was prescribed Eliquis, though she did not start this and has been out of cardizem. She presented to the ED with palpitations but otherwise hemodynamically stable.  Labs significant for WBC 18k, BNP 382, troponin WNL,  CTA of the chest/abdomen and pelvis was obtained showing thoracic aortic aneurysm 5.6cm with suspected thrombosed chronic type B dissection extending from the distal arch to the level of T5-6.  She was started on a cardene drip and given IV cardizem and transferred to [facility] for vascular surgery evaluation and admission.  Vascular surgery was consulted. Vascular surgery recommended no acute surgical intervention needed, recommended outpatient repeat CT scan and follow up. She incidentally notes a small amount of red blood in her stool the last few days with possible proctitis seen on CT.  She was started on IV antibiotics. Cardiology was consulted.  Patient was started on metoprolol and Cardizem.  Also resumed on Eliquis.  Patient felt better , heart rate reasonably controlled.  Patient wanted to be discharged,  cleared from cardiology for discharge.   Discharge Diagnoses:  Principal Problem:   Aortic dissection Active Problems:   Aortic dissection distal to left subclavian   Atrial fibrillation with RVR   Descending thoracic aortic aneurysm with chronic type B aortic dissection: Patient presented with pleuritic chest pain associated with shortness of breath. CT chest showed descending aortic aneurysm with chronic type B aortic dissection. Vascular surgery consult appreciated.  Recommended outpatient follow-up,  No inpatient surgical intervention needed. Continue adequate pain control. As per Dr. Edilia Bo, okay to start anticoagulation for atrial fibrillation. She does not require very strict blood pressure and heart rate goals.   Patient is started on Eliquis. She reports chest pain has resolved.   Paroxysmal atrial fibrillation: HR well controlled. Continue Eliquis twice daily Continue telemetry. Continue metoprolol 25 mg twice daily. Cardiology is consulted to help with the rate and rhythm control given difficulty as outpatient with rate control. Started on Cardizem 60 mg every 12 hours. HR controlled, Cardiology signed off. Patient is being discharged home.   Proctitis. Continue ceftriaxone and Flagyl for 3 days. FOBT negative. She reports feeling better.   Cough secondary to recent COVID infection: Continue a trial of inhaled corticosteroid twice daily for a month  Discharge Instructions  Discharge Instructions     Call MD for:  difficulty breathing, headache or visual disturbances   Complete by: As directed    Call MD for:  persistant dizziness or light-headedness   Complete by:  As directed    Call MD for:  persistant nausea and vomiting   Complete by:  As directed    Diet - low sodium heart healthy   Complete by: As directed    Diet Carb Modified   Complete by: As directed    Discharge instructions   Complete by: As directed    Advised to follow-up with primary care physician in 1 week. Advised to follow-up with cardiology (atrial fibrillation clinic in 2 weeks) Advised to take metoprolol 25 mg twice daily, Cardizem 60 mg every 12 hours for atrial fibrillation Advised to take Eliquis 5 mg twice daily for anticoagulation for A-fib. Advised to take additional dose of metoprolol for heart rate not controlled.   Increase activity slowly   Complete by: As directed       Allergies as of 08/20/2022   No Known Allergies      Medication List     STOP taking these medications    diltiazem 180 MG 24 hr tablet Commonly known as: Cardizem LA Replaced by: diltiazem 60 MG 12 hr capsule   metoprolol succinate 50 MG 24 hr tablet Commonly known as: TOPROL-XL       TAKE these medications    alendronate 70 MG tablet Commonly known as: FOSAMAX TAKE 1 TABLET BY MOUTH ONCE A WEEK ON AN EMPTY STOMACH WITH A FULL GLASS OF WATER   ascorbic acid 500 MG tablet Commonly known as: VITAMIN C Take 500 mg by mouth daily.   Biotin 1000 MCG tablet Take 1,000 mcg by mouth daily.   CALCIUM 600 + D PO Take 600 mg by mouth once.   chlorpheniramine 4 MG tablet Commonly known as: CHLOR-TRIMETON Take 4 mg by mouth 2 (two) times daily as needed for allergies.   co-enzyme Q-10 30 MG capsule Take 30 mg by mouth 3 (three) times daily.   Cranberry-Cholecalciferol 4200-500 MG-UNIT Caps Take 1 tablet by mouth daily.   diltiazem 60 MG 12 hr capsule Commonly known as: CARDIZEM SR Take 1 capsule (60 mg total) by mouth every 12 (twelve) hours. Replaces: diltiazem 180 MG 24 hr tablet   docusate sodium 250 MG capsule Commonly known as: COLACE Take 250 mg by mouth daily.   Eliquis 5 MG Tabs tablet Generic drug: apixaban Take 1 tablet (5 mg total)  by mouth 2 (two) times daily.   FIBER THERAPY PO Take 1 Scoop by mouth daily.   furosemide 40 MG tablet Commonly known as: LASIX Take 1 tablet (40 mg total) by mouth daily.   metoprolol tartrate 25 MG tablet Commonly known as: LOPRESSOR Take 1 tablet (25 mg total) by mouth 2 (two) times daily.   multivitamin tablet Take 1 tablet by mouth daily.   PROBIOTIC PO Take 1 tablet by mouth daily.   Vitamin D 50 MCG (2000 UT) Caps Take 2,000 Units by mouth daily.        Follow-up Information     Donita BrooksPickard, Warren T, MD Follow up in 1 week(s).   Specialty: Family Medicine Contact information: [address] [place] [address] [phone]         Maisie FusBranch, Mary E, MD Follow up in 2 week(s).   Specialty: Cardiology Contact information: [address] [place] [address] [phone]                No Known Allergies  Consultations: Cardiology Vascular surgery   Procedures/Studies: ECHOCARDIOGRAM COMPLETE  Result Date: 08/18/2022    ECHOCARDIOGRAM REPORT  Patient Name:   Latrelle DodrillBRENDA J Flury Date of Exam: 08/18/2022 Medical Rec #:  [id number]      Height:       62.0 in Accession #:    [id number]     Weight:       122.8 lb Date of Birth:  03/04/1951      BSA:          1.554 m Patient Age:    71 years       BP:           120/74 mmHg Patient Gender: F              HR:           111 bpm. Exam Location:  Inpatient Procedure: 2D Echo, Cardiac Doppler and Color Doppler Indications:    Atrial Fibrillation I48.91  History:        Patient has prior history of Echocardiogram examinations, most                 recent 06/26/2021. Risk Factors:Dyslipidemia.  Sonographer:    Lucendia HerrlichShanika Turnbull Referring Phys: [id number] LAURA P CLARK IMPRESSIONS  1. Left ventricular ejection fraction, by estimation, is 60 to 65%. The left ventricle has normal function. The left ventricle has no regional wall motion abnormalities. Left ventricular diastolic function could not be evaluated.   2. Right ventricular systolic function is normal. The right ventricular size is normal. There is normal pulmonary artery systolic pressure. The estimated right ventricular systolic pressure is 26.5 mmHg.  3. Left atrial size was mild to moderately dilated.  4. Right atrial size was mildly dilated.  5. The mitral valve is normal in structure. Mild mitral valve regurgitation. No evidence of mitral stenosis.  6. Tricuspid valve regurgitation is mild to moderate.  7. The aortic valve is tricuspid. Aortic valve regurgitation is not visualized. No aortic stenosis is present.  8. The inferior vena cava is dilated in size with >50% respiratory variability, suggesting right atrial pressure of 8 mmHg. FINDINGS  Left Ventricle: Left ventricular ejection fraction, by estimation, is 60 to 65%. The left ventricle has normal function. The left ventricle has no regional wall motion abnormalities. The left ventricular internal cavity size was normal in size. There is  no left ventricular hypertrophy. Left ventricular diastolic function could not be evaluated due to atrial fibrillation. Left ventricular diastolic function could not be evaluated. Right Ventricle: The right ventricular size is normal. No increase in right ventricular wall thickness. Right ventricular systolic function is normal. There is normal pulmonary artery systolic pressure. The tricuspid regurgitant velocity is 2.15 m/s, and  with an assumed right atrial pressure of 8 mmHg, the estimated right ventricular systolic pressure is 26.5 mmHg. Left Atrium: Left atrial size was mild to moderately dilated. Right Atrium: Right atrial size was mildly dilated. Pericardium: There is no evidence of pericardial effusion. Mitral Valve: The mitral valve is normal in structure. Mild mitral valve regurgitation, with centrally-directed jet. No evidence of mitral valve stenosis. Tricuspid Valve: The tricuspid valve is normal in structure. Tricuspid valve regurgitation is mild to  moderate. No evidence of tricuspid stenosis. Aortic Valve: The aortic valve is tricuspid. Aortic valve regurgitation is not visualized. No aortic stenosis is present. Aortic valve mean gradient measures 3.0 mmHg. Aortic valve peak gradient measures 5.6 mmHg. Aortic valve area, by VTI measures 2.14 cm. Pulmonic Valve: The pulmonic valve was normal in structure. Pulmonic valve regurgitation is not visualized. No evidence of pulmonic stenosis. Aorta: The aortic  root is normal in size and structure. Venous: The inferior vena cava is dilated in size with greater than 50% respiratory variability, suggesting right atrial pressure of 8 mmHg. IAS/Shunts: No atrial level shunt detected by color flow Doppler.  LEFT VENTRICLE PLAX 2D LVIDd:         3.80 cm   Diastology LVIDs:         2.50 cm   LV e' medial:    9.95 cm/s LV PW:         1.10 cm   LV E/e' medial:  11.2 LV IVS:        0.80 cm   LV e' lateral:   16.20 cm/s LVOT diam:     1.70 cm   LV E/e' lateral: 6.9 LV SV:         41 LV SV Index:   27 LVOT Area:     2.27 cm  RIGHT VENTRICLE             IVC RV S prime:     11.50 cm/s  IVC diam: 2.30 cm TAPSE (M-mode): 1.6 cm LEFT ATRIUM             Index        RIGHT ATRIUM           Index LA diam:        4.00 cm 2.57 cm/m   RA Area:     17.50 cm LA Vol (A2C):   39.3 ml 25.29 ml/m  RA Volume:   43.10 ml  27.74 ml/m LA Vol (A4C):   53.9 ml 34.69 ml/m LA Biplane Vol: 51.3 ml 33.02 ml/m  AORTIC VALVE AV Area (Vmax):    2.22 cm AV Area (Vmean):   2.09 cm AV Area (VTI):     2.14 cm AV Vmax:           118.00 cm/s AV Vmean:          82.500 cm/s AV VTI:            0.193 m AV Peak Grad:      5.6 mmHg AV Mean Grad:      3.0 mmHg LVOT Vmax:         115.33 cm/s LVOT Vmean:        75.867 cm/s LVOT VTI:          0.182 m LVOT/AV VTI ratio: 0.94  AORTA Ao Root diam: 3.40 cm Ao Asc diam:  3.40 cm MR Peak grad: 87.4 mmHg     TRICUSPID VALVE MR Mean grad: 61.0 mmHg     TR Peak grad:   18.5 mmHg MR Vmax:      467.33 cm/s   TR Vmax:         215.00 cm/s MR Vmean:     375.0 cm/s MV E velocity: 111.00 cm/s  SHUNTS                             Systemic VTI:  0.18 m                             Systemic Diam: 1.70 cm Rachelle Hora Croitoru MD Electronically signed by Thurmon Fair MD Signature Date/Time: 08/18/2022/3:12:15 PM    Final    CT Angio Chest/Abd/Pel for Dissection W and/or Wo Contrast  Result Date: 08/17/2022 CLINICAL DATA:  Shortness of breath and one-week history of tachycardia  associated with headache EXAM: CT ANGIOGRAPHY CHEST, ABDOMEN AND PELVIS TECHNIQUE: Non-contrast CT of the chest was initially obtained. Multidetector CT imaging through the chest, abdomen and pelvis was performed using the standard protocol during bolus administration of intravenous contrast. Multiplanar reconstructed images and MIPs were obtained and reviewed to evaluate the vascular anatomy. RADIATION DOSE REDUCTION: This exam was performed according to the departmental dose-optimization program which includes automated exposure control, adjustment of the mA and/or kV according to patient size and/or use of iterative reconstruction technique. CONTRAST:  60mL OMNIPAQUE IOHEXOL 350 MG/ML SOLN COMPARISON:  CT abdomen and pelvis dated 12/18/2020 FINDINGS: CTA CHEST FINDINGS Cardiovascular: Preferential opacification of the thoracic aorta. Ascending aorta measures 4.2 x 4.0 cm. The proximal descending aorta measures 5.6 x 4.8 cm. Irregular hypoattenuating noncalcified thrombus is seen extending from the level of the distal arch immediately distal to the left subclavian artery origin to the level of the diaphragmatic hiatus. At the proximal aspect, there is slight displacement of the intimal calcification, suggestive of dissection (8:14) with thrombosis of the false lumen, extending to the level T5-6. Normal heart size. No pericardial effusion. Mediastinum/Nodes: Imaged thyroid gland without nodules meeting criteria for imaging follow-up by size. Small hiatal hernia. No  pathologically enlarged axillary, supraclavicular, mediastinal, or hilar lymph nodes. Lungs/Pleura: The central airways are patent. Bilateral lower lobe relaxation atelectasis. 3 mm ground-glass nodule along the right minor fissure (7:66). No pneumothorax. Trace right and small left pleural effusions. Musculoskeletal: No acute or abnormal lytic or blastic osseous lesions. Review of the MIP images confirms the above findings. CTA ABDOMEN AND PELVIS FINDINGS VASCULAR Aorta: Normal caliber aorta without aneurysm, dissection, vasculitis or significant stenosis. Celiac: Patent without evidence of aneurysm, dissection, vasculitis or significant stenosis. SMA: Patent without evidence of aneurysm, dissection, vasculitis or significant stenosis. Renals: Single right and 2 left renal arteries. The left renal arteries arise from the distal abdominal aorta, inferior to the IMA takeoff and from the proximal right common iliac artery. The renal arteries are patent without evidence of aneurysm, dissection, vasculitis, fibromuscular dysplasia or significant stenosis. IMA: Patent without evidence of aneurysm, dissection, vasculitis or significant stenosis. Inflow: Patent without evidence of aneurysm, dissection, vasculitis or significant stenosis. Proximal Outflow: Bilateral common femoral and visualized portions of the superficial and profunda femoral arteries are patent without evidence of aneurysm, dissection, vasculitis or significant stenosis. Veins: No obvious venous abnormality within the limitations of this arterial phase study. Review of the MIP images confirms the above findings. NON-VASCULAR Hepatobiliary: No focal hepatic lesions. No intra or extrahepatic biliary ductal dilation. Normal gallbladder. Pancreas: No focal lesions or main ductal dilation. Spleen: Normal in size without focal abnormality. Adrenals/Urinary Tract: No adrenal nodules. Pelvic left kidney. No suspicious renal mass, calculi or hydronephrosis.  Bilateral simple cysts. No specific follow-up imaging recommended. no focal bladder wall thickening. Stomach/Bowel: Normal appearance of the stomach. Mural thickening of the rectum. Sigmoid diverticulosis. Appendix is not discretely seen. Lymphatic: No enlarged abdominal or pelvic lymph nodes. Reproductive: No adnexal masses. Other: Hyperattenuating small volume pelvic free fluid. No free air. Musculoskeletal: No acute or abnormal lytic or blastic osseous lesions. Review of the MIP images confirms the above findings. IMPRESSION: VASCULAR: 1. 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 Recommend continued management and follow-up per vascular surgery. NONVASCULAR: 1. Trace right and small left pleural effusions. 2. Nonspecific 3 mm ground-glass nodule  along the right minor fissure. No follow-up needed if patient is low-risk.This recommendation follows the consensus statement: Guidelines for Management of Incidental Pulmonary Nodules Detected on CT Images: From the Fleischner Society 2017; Radiology 2017; 284:228-243. 3. Small volume hyperattenuating pelvic free fluid, suspicious for hemorrhage adjacent to mural thickening of the rectum, which may represent proctitis. 4. Appendix is not discretely seen. 5. Pelvic left kidney. These results were called by telephone at the time of interpretation on 08/17/2022 at 2:10 pm to provider [facility] , who verbally acknowledged these results. Electronically Signed   By: Agustin Cree M.D.   On: 08/17/2022 14:43   CT Angio Chest PE W/Cm &/Or Wo Cm  Result Date: 08/17/2022 CLINICAL DATA:  Headache, AFib, concern for pulmonary embolism. EXAM: CT ANGIOGRAPHY CHEST WITH CONTRAST TECHNIQUE: Multidetector CT imaging of the chest was performed using the standard protocol during bolus administration of intravenous contrast.  Multiplanar CT image reconstructions and MIPs were obtained to evaluate the vascular anatomy. RADIATION DOSE REDUCTION: This exam was performed according to the departmental dose-optimization program which includes automated exposure control, adjustment of the mA and/or kV according to patient size and/or use of iterative reconstruction technique. CONTRAST:  63mL OMNIPAQUE IOHEXOL 350 MG/ML SOLN COMPARISON:  No direct comparison study available. CT abdomen/pelvis 12/18/2020 FINDINGS: Cardiovascular: There is adequate opacification of the pulmonary arteries to the segmental level. There is no evidence of pulmonary embolism. The ascending thoracic aorta is aneurysmal measuring up to 4.1 cm. The arch measures up to 4.8 cm in the sagittal plane (8-95). There is crescentic hypodensity along the posterior wall of the aortic arch and descending thoracic aorta which may reflect dissection/intramural hematoma or noncalcified plaque. Curvilinear hyperdense material within this hypodensity is consistent with calcification. The thoracic aorta remains patent though is suboptimally opacified. The heart size is normal. There is no pericardial effusion. There is reflux of contrast into the IVC. Mediastinum/Nodes: The imaged thyroid is unremarkable. The esophagus is grossly unremarkable. There is a small hiatal hernia. There is no mediastinal, axillary, or hilar lymphadenopathy. Lungs/Pleura: The trachea and central airways are patent. There is a small right posterolateral diverticulum in the upper trachea (6-16). There are small bilateral pleural effusions with adjacent atelectasis. There is no other focal airspace opacity. There is no pulmonary edema. There is no pneumothorax There are no suspicious nodules. Upper Abdomen: The imaged portions of the upper abdominal viscera are unremarkable. Musculoskeletal: There is no acute osseous abnormality or suspicious osseous lesion. Review of the MIP images confirms the above findings.  IMPRESSION: 1. Aneurysmal ascending thoracic aorta and aortic arch measuring up to 4.8 cm at the arch with crescentic hypodensity along the posterior wall throughout the thoracic aorta beginning at the arch just beyond the origin of the brachiocephalic artery extending to the lower thoracic aorta which may reflect dissection/acute intramural hematoma or noncalcified plaque. Recommend vascular surgery consultation due to size and dissection protocol CTA of the chest. 2. No evidence of pulmonary embolism. 3. Reflux of contrast into the IVC suggestive of right heart dysfunction. 4. Small bilateral pleural effusions. These results were called by telephone at the time of interpretation on 08/17/2022 at 1:35 pm to provider [facility] , who verbally acknowledged these results. Electronically Signed   By: Lesia Hausen M.D.   On: 08/17/2022 13:41   MM 3D SCREEN BREAST BILATERAL  Result Date: 07/30/2022 CLINICAL DATA:  Screening. EXAM: DIGITAL SCREENING BILATERAL MAMMOGRAM WITH TOMOSYNTHESIS AND CAD TECHNIQUE: Bilateral screening digital craniocaudal and mediolateral oblique mammograms were  obtained. Bilateral screening digital breast tomosynthesis was performed. The images were evaluated with computer-aided detection. COMPARISON:  Previous exam(s). ACR Breast Density Category b: There are scattered areas of fibroglandular density. FINDINGS: There are no findings suspicious for malignancy. IMPRESSION: No mammographic evidence of malignancy. A result letter of this screening mammogram will be mailed directly to the patient. RECOMMENDATION: Screening mammogram in one year. (Code:SM-B-01Y) BI-RADS CATEGORY  1: Negative. Electronically Signed   By: Norva Pavlov M.D.   On: 07/30/2022 08:29    Subjective: Patient was seen and examined at bedside.  Overnight events noted.   Heart rate is controlled.  Denies any palpitations, She wants to be discharged.  Discharge Exam: Vitals:   08/20/22 1700 08/20/22 1712  BP:   (!) 125/92  Pulse: (!) 117 (!) 122  Resp:  20  Temp:  98.4 F (36.9 C)  SpO2: 96% 96%   Vitals:   08/20/22 1500 08/20/22 1600 08/20/22 1700 08/20/22 1712  BP: 105/75 (!) 124/93  (!) 125/92  Pulse: 95 (!) 114 (!) 117 (!) 122  Resp:  18  20  Temp:    98.4 F (36.9 C)  TempSrc:    Oral  SpO2: 95% 95% 96% 96%  Weight:      Height:        General: Pt is alert, awake, not in acute distress Cardiovascular: RRR, S1/S2 +, no rubs, no gallops Respiratory: CTA bilaterally, no wheezing, no rhonchi Abdominal: Soft, NT, ND, bowel sounds + Extremities: no edema, no cyanosis    The results of significant diagnostics from this hospitalization (including imaging, microbiology, ancillary and laboratory) are listed below for reference.     Microbiology: No results found for this or any previous visit (from the past 240 hour(s)).   Labs: BNP (last 3 results) Recent Labs    08/17/22 1229  BNP 382.1*   Basic Metabolic Panel: Recent Labs  Lab 08/17/22 1229 08/18/22 0301 08/19/22 0056 08/20/22 0635  NA 139 138 140 137  K 3.9 3.9 3.5 3.8  CL 102 106 106 104  CO2 26 21* 22 21*  GLUCOSE 100* 128* 105* 99  BUN 21 21 20 15   CREATININE 0.64 0.78 0.74 0.68  CALCIUM 9.8 8.5* 8.4* 8.7*  MG  --  2.1 2.1 2.1  PHOS  --  3.4  --  3.1   Liver Function Tests: No results for input(s): "AST", "ALT", "ALKPHOS", "BILITOT", "PROT", "ALBUMIN" in the last 168 hours. No results for input(s): "LIPASE", "AMYLASE" in the last 168 hours. No results for input(s): "AMMONIA" in the last 168 hours. CBC: Recent Labs  Lab 08/17/22 1229 08/17/22 2105 08/18/22 0301 08/19/22 0056 08/20/22 0635  WBC 18.6*  --  19.2* 11.6* 10.4  NEUTROABS 14.9*  --   --   --   --   HGB 13.7 11.5* 12.0 11.8* 12.5  HCT 41.0 35.0* 36.0 35.0* 36.8  MCV 88.9  --  90.0 87.9 87.4  PLT 345  --  355 295 391   Cardiac Enzymes: No results for input(s): "CKTOTAL", "CKMB", "CKMBINDEX", "TROPONINI" in the last 168  hours. BNP: Invalid input(s): "POCBNP" CBG: No results for input(s): "GLUCAP" in the last 168 hours. D-Dimer No results for input(s): "DDIMER" in the last 72 hours. Hgb A1c No results for input(s): "HGBA1C" in the last 72 hours. Lipid Profile No results for input(s): "CHOL", "HDL", "LDLCALC", "TRIG", "CHOLHDL", "LDLDIRECT" in the last 72 hours. Thyroid function studies No results for input(s): "TSH", "T4TOTAL", "T3FREE", "THYROIDAB" in the last  72 hours.  Invalid input(s): "FREET3" Anemia work up No results for input(s): "VITAMINB12", "FOLATE", "FERRITIN", "TIBC", "IRON", "RETICCTPCT" in the last 72 hours. Urinalysis    Component Value Date/Time   COLORURINE YELLOW 08/28/2018 1125   APPEARANCEUR CLEAR 08/28/2018 1125   LABSPEC 1.020 12/09/2020 1010   PHURINE 7.0 12/09/2020 1010   GLUCOSEU NEGATIVE 12/09/2020 1010   HGBUR TRACE (A) 12/09/2020 1010   BILIRUBINUR NEGATIVE 12/09/2020 1010   KETONESUR NEGATIVE 12/09/2020 1010   PROTEINUR NEGATIVE 12/09/2020 1010   UROBILINOGEN 0.2 12/09/2020 1010   NITRITE NEGATIVE 12/09/2020 1010   LEUKOCYTESUR NEGATIVE 12/09/2020 1010   Sepsis Labs Recent Labs  Lab 08/17/22 1229 08/18/22 0301 08/19/22 0056 08/20/22 0635  WBC 18.6* 19.2* 11.6* 10.4   Microbiology No results found for this or any previous visit (from the past 240 hour(s)).   Time coordinating discharge: Over 30 minutes  SIGNED:   Willeen Niece, MD  [facility] 08/20/2022, 6:04 PM Pager   If 7PM-7AM, please contact night-coverage

## 2022-08-20 NOTE — Plan of Care (Signed)
Remains rate controlled, no changes from a cardiology perspective

## 2022-08-23 ENCOUNTER — Telehealth: Payer: Self-pay

## 2022-08-23 NOTE — Transitions of Care (Post Inpatient/ED Visit) (Signed)
   08/23/2022  Name: Terri Alexander MRN: [id number] DOB: [birth year]  Today's TOC FU Call Status: Today's TOC FU Call Status:: Successful TOC FU Call Competed TOC FU Call Complete Date: 08/23/22  Transition Care Management Follow-up Telephone Call Date of Discharge: 08/20/22 Discharge Facility: [facility]) Type of Discharge: Inpatient Admission Primary Inpatient Discharge Diagnosis:: Aortic Dissection How have you been since you were released from the hospital?: Better Any questions or concerns?: No  Items Reviewed: Did you receive and understand the discharge instructions provided?: Yes Medications obtained and verified?: Yes (Medications Reviewed) Any new allergies since your discharge?: No Dietary orders reviewed?: Yes Type of Diet Ordered:: Low Sodium, Heart Healthy Do you have support at home?: Yes People in Home: spouse Name of Support/Comfort Primary Source: [facility] and Equipment/Supplies: Were Home Health Services Ordered?: No Any new equipment or medical supplies ordered?: No  Functional Questionnaire: Do you need assistance with bathing/showering or dressing?: No Do you need assistance with meal preparation?: No Do you need assistance with eating?: No Do you have difficulty maintaining continence: No Do you need assistance with getting out of bed/getting out of a chair/moving?: No Do you have difficulty managing or taking your medications?: No  Follow up appointments reviewed: PCP Follow-up appointment confirmed?: Yes Date of PCP follow-up appointment?: 08/27/22 Follow-up Provider: Dr. Tanya Nones Specialist [facility] Follow-up appointment confirmed?: Yes Date of Specialist follow-up appointment?: 09/16/22 Follow-Up Specialty Provider:: Dr. Wyline Mood- Cardiology Do you need transportation to your follow-up appointment?: No Do you understand care options if your condition(s) worsen?: Yes-patient verbalized understanding  SDOH Interventions Today     Flowsheet Row Most Recent Value  SDOH Interventions   Food Insecurity Interventions Intervention Not Indicated  Housing Interventions Intervention Not Indicated  Utilities Interventions Intervention Not Indicated      Jodelle Gross, RN, BSN, CCM Care Management Coordinator [facility] Phone: [phone]/Fax: [phone]

## 2022-08-25 ENCOUNTER — Ambulatory Visit (HOSPITAL_COMMUNITY)
Admit: 2022-08-25 | Discharge: 2022-08-25 | Disposition: A | Discharge status: Home / Self Care | Payer: Medicare Other | Attending: Internal Medicine | Admitting: Internal Medicine

## 2022-08-25 VITALS — BP 114/76 | HR 114 | Ht 62.0 in | Wt 127.8 lb

## 2022-08-25 DIAGNOSIS — D6869 Other thrombophilia: Secondary | ICD-10-CM | POA: Diagnosis not present

## 2022-08-25 DIAGNOSIS — I4819 Other persistent atrial fibrillation: Secondary | ICD-10-CM | POA: Insufficient documentation

## 2022-08-25 DIAGNOSIS — I4891 Unspecified atrial fibrillation: Secondary | ICD-10-CM

## 2022-08-25 DIAGNOSIS — Z006 Encounter for examination for normal comparison and control in clinical research program: Secondary | ICD-10-CM

## 2022-08-25 MED ORDER — APIXABAN 5 MG PO TABS: 5.0000 mg | ORAL_TABLET | Freq: Two times a day (BID) | ORAL | 1 refills | Status: DC

## 2022-08-25 MED ORDER — METOPROLOL TARTRATE 25 MG PO TABS: 25.0000 mg | ORAL_TABLET | Freq: Two times a day (BID) | ORAL | 1 refills | Status: DC

## 2022-08-25 MED ORDER — DILTIAZEM HCL ER 60 MG PO CP12: 60.0000 mg | ORAL_CAPSULE | Freq: Two times a day (BID) | ORAL | 1 refills | Status: DC

## 2022-08-25 NOTE — Research (Signed)
MASIMO Informed Consent   Subject Name: Terri Alexander  Subject met inclusion and exclusion criteria.  The informed consent form, study requirements and expectations were reviewed with the subject and questions and concerns were addressed prior to the signing of the consent form.  The subject verbalized understanding of the trial requirements.  The subject agreed to participate in the Masimo trial and signed the informed consent on 10/Apr/2024.  The informed consent was obtained prior to performance of any protocol-specific procedures for the subject.  A copy of the signed informed consent was given to the subject and a copy was placed in the subject's medical record.   [name]

## 2022-08-25 NOTE — Patient Instructions (Addendum)
Cardioversion scheduled for: Thursday, April 25th   - Arrive at the [facility] and go to admitting at    - Do not eat or drink anything after midnight the night prior to your procedure.   - Take all your morning medication (except diabetic medications) with a sip of water prior to arrival.  - You will not be able to drive home after your procedure.    - Do NOT miss any doses of your blood thinner - if you should miss a dose please notify our office immediately.   - If you feel as if you go back into normal rhythm prior to scheduled cardioversion, please notify our office immediately.   If your procedure is canceled in the cardioversion suite you will be charged a cancellation fee.

## 2022-08-25 NOTE — Progress Notes (Signed)
  Primary Care Physician: Pickard, Warren T, MD Primary Cardiologist: Dr. Branch Primary Electrophysiologist: None Referring Physician:    Terri Alexander is a 72 y.o. 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 The patient was initially diagnosed with atrial fibrillation on 3/25 by PCP and was started on Eliquis 5 mg BID. Patient admits to not starting anticoagulation at that time yet. She saw PCP on f/u 3/28 and Toprol was increased to 100 mg daily. She presented to the ED at [facility] on 4/2 with chest pain and shortness of breath. Review of records show she took two full doses of Eliquis on 4/3. She did not have surgery for vascular finding, believed to be chronic, and has follow up with them outpatient for repeat CT. She was discharged on diltiazem 60 mg BID and Lopressor 25 mg BID. Patient is on Eliquis 5 mg BID for a CHADS2VASC score of 3.  On evaluation today, she is currently in Afib. She states she does not really feel it at this time. She states it is not like when she had chest pain and shortness of breath on day of ED visit. She is transitioning to Dr. Branch for new cardiologist. She feels tired when she does activity but otherwise in office feels okay. She does not drink alcohol. She does not snore or stop breathing per husband.   Mother and aunt with history of Afib.  She is compliant with anticoagulation and has not missed any doses. She has no bleeding concerns.  Today, she denies symptoms of palpitations, orthopnea, PND, lower extremity edema, dizziness, presyncope, syncope, snoring, daytime somnolence, bleeding, or neurologic sequela. The patient is tolerating medications without difficulties and is otherwise without complaint today.   Atrial  Fibrillation Risk Factors:  she does not have symptoms or diagnosis of sleep apnea. she does not have a history of rheumatic fever. she does not have a history of alcohol use. The patient does have a history of early familial atrial fibrillation or other arrhythmias.  she has a BMI of Body mass index is 23.37 kg/m.. Filed Weights   08/25/22 0834  Weight: 58 kg    Family History  Problem Relation Age of Onset   Arthritis Mother    Heart disease Mother    Vision loss Mother    Stroke Mother    Depression Sister    Transient ischemic attack Sister 72   Depression Sister    Cancer Maternal Grandmother    Cancer Maternal Grandfather    Early death Maternal Grandfather    Heart disease Maternal Grandfather    Early death Paternal Grandfather    Heart disease Paternal Grandfather    Diabetes Other        Juvenile   Colon cancer Neg Hx    Rectal cancer Neg Hx    Prostate cancer Neg Hx      Atrial Fibrillation Management history:  Previous antiarrhythmic drugs: None Previous cardioversions: None Previous ablations: None Anticoagulation history: 3   Past Medical History:  Diagnosis Date   Adenomatous colon polyp    Allergy    Anemia    as a child, no current problem   Atrial fibrillation    Blood transfusion without reported diagnosis    9 months old   Cataract    bilateral - MD just watching      GERD (gastroesophageal reflux disease)    Hyperlipidemia    Osteoporosis 04/15/2016   Skin cancer 2013   Skin cancer- Nose, face   Past Surgical History:  Procedure Laterality Date   CESAREAN SECTION  1981   x 1   COLONOSCOPY  2016   hx polyp, tics, sigmoid colon mod tortuous Dr Chung,    EYE SURGERY Bilateral    retina repair x 2   LAPAROTOMY  1979   removal of ovarian cyst   nissan fundiplication  05/17/1997   REPAIR OF COMPLEX TRACTION RETINAL DETACHMENT Bilateral 01/2019    Current Outpatient Medications  Medication Sig Dispense Refill   alendronate  (FOSAMAX) 70 MG tablet TAKE 1 TABLET BY MOUTH ONCE A WEEK ON AN EMPTY STOMACH WITH A FULL GLASS OF WATER 12 tablet 3   apixaban (ELIQUIS) 5 MG TABS tablet Take 1 tablet (5 mg total) by mouth 2 (two) times daily. 60 tablet 3   Biotin 1000 MCG tablet Take 1,000 mcg by mouth daily.     Calcium Carb-Cholecalciferol (CALCIUM 600 + D PO) Take 600 mg by mouth once.     chlorpheniramine (CHLOR-TRIMETON) 4 MG tablet Take 4 mg by mouth 2 (two) times daily as needed for allergies.     Cholecalciferol (VITAMIN D) 50 MCG (2000 UT) CAPS Take 2,000 Units by mouth daily.     co-enzyme Q-10 30 MG capsule Take 30 mg by mouth every morning.     Cranberry-Cholecalciferol 4200-500 MG-UNIT CAPS Take 1 tablet by mouth daily.     diltiazem (CARDIZEM SR) 60 MG 12 hr capsule Take 1 capsule (60 mg total) by mouth every 12 (twelve) hours. 60 capsule 1   furosemide (LASIX) 40 MG tablet Take 1 tablet (40 mg total) by mouth daily. 30 tablet 3   Methylcellulose, Laxative, (FIBER THERAPY PO) Take 1 Scoop by mouth daily.     metoprolol tartrate (LOPRESSOR) 25 MG tablet Take 1 tablet (25 mg total) by mouth 2 (two) times daily. 60 tablet 1   Multiple Vitamin (MULTIVITAMIN) tablet Take 1 tablet by mouth daily.     Probiotic Product (PROBIOTIC PO) Take 1 tablet by mouth daily.     vitamin C (ASCORBIC ACID) 500 MG tablet Take 500 mg by mouth daily.     docusate sodium (COLACE) 250 MG capsule Take 250 mg by mouth daily. (Patient not taking: Reported on 08/25/2022)     No current facility-administered medications for this encounter.    No Known Allergies  Social History   Socioeconomic History   Marital status: Married    Spouse name: Not on file   Number of children: Not on file   Years of education: Not on file   Highest education level: 12th grade  Occupational History   Not on file  Tobacco Use   Smoking status: Never   Smokeless tobacco: Never  Vaping Use   Vaping Use: Never used  Substance and Sexual Activity    Alcohol use: Yes    Alcohol/week: 1.0 standard drink of alcohol    Types: 1 Glasses of wine per week    Comment: rarely   Drug use: No   Sexual activity: Not Currently    Birth control/protection: Post-menopausal  Other Topics Concern   Not on file  Social History Narrative   Not on file   Social Determinants of Health   Financial Resource Strain: Low Risk  (08/09/2022)   Overall Financial Resource Strain (CARDIA)    Difficulty of Paying Living Expenses:   Not hard at all  Food Insecurity: No Food Insecurity (08/23/2022)   Hunger Vital Sign    Worried About Running Out of Food in the Last Year: Never true    Ran Out of Food in the Last Year: Never true  Transportation Needs: No Transportation Needs (08/09/2022)   PRAPARE - Transportation    Lack of Transportation (Medical): No    Lack of Transportation (Non-Medical): No  Physical Activity: Unknown (08/09/2022)   Exercise Vital Sign    Days of Exercise per Week: 3 days    Minutes of Exercise per Session: Not on file  Stress: No Stress Concern Present (08/09/2022)   [employer] - Occupational Stress Questionnaire    Feeling of Stress : Only a little  Social Connections: Moderately Integrated (08/09/2022)   Social Connection and Isolation Panel [NHANES]    Frequency of Communication with Friends and Family: Three times a week    Frequency of Social Gatherings with Friends and Family: Twice a week    Attends Religious Services: 1 to 4 times per year    Active Member of Clubs or Organizations: No    Attends Club or Organization Meetings: Not on file    Marital Status: Married  Intimate Partner Violence: Not on file    ROS- All systems are reviewed and negative except as per the HPI above.  Physical Exam: Vitals:   08/25/22 0834  BP: 114/76  Pulse: (!) 114  Weight: 58 kg  Height: 5' 2" (1.575 m)    GEN- The patient is a well appearing female, alert and oriented x 3 today.   Head- normocephalic,  atraumatic Eyes-  Sclera clear, conjunctiva pink Ears- hearing intact Oropharynx- clear Neck- supple  Lungs- Clear to ausculation bilaterally, normal work of breathing Heart- Irregular rate and rhythm, no murmurs, rubs or gallops  GI- soft, NT, ND, + BS Extremities- no clubbing, cyanosis, or edema MS- no significant deformity or atrophy Skin- no rash or lesion Psych- euthymic mood, full affect Neuro- strength and sensation are intact  Wt Readings from Last 3 Encounters:  08/25/22 58 kg  08/20/22 59.2 kg  08/12/22 58.1 kg    EKG today demonstrates Afib with RVR HR 114 QRS 84 ms QT/Qtc 326/449 ms  Echo 08/18/22 demonstrated:  1. Left ventricular ejection fraction, by estimation, is 60 to 65%. The  left ventricle has normal function. The left ventricle has no regional  wall motion abnormalities. Left ventricular diastolic function could not  be evaluated.   2. Right ventricular systolic function is normal. The right ventricular  size is normal. There is normal pulmonary artery systolic pressure. The  estimated right ventricular systolic pressure is 26.5 mmHg.   3. Left atrial size was mild to moderately dilated.   4. Right atrial size was mildly dilated.   5. The mitral valve is normal in structure. Mild mitral valve  regurgitation. No evidence of mitral stenosis.   6. Tricuspid valve regurgitation is mild to moderate.   7. The aortic valve is tricuspid. Aortic valve regurgitation is not  visualized. No aortic stenosis is present.   8. The inferior vena cava is dilated in size with >50% respiratory  variability, suggesting right atrial pressure of 8 mmHg.   Epic records are reviewed at length today.  CHA2DS2-VASc Score = 3  The patient's score is based upon: CHF History: 0 HTN History: 0 Diabetes History: 0 Stroke History: 0 Vascular Disease History: 1 Age Score: 1 Gender Score: 1         ASSESSMENT AND PLAN: Persistent Atrial Fibrillation (ICD10:  I48.19) The  patient's CHA2DS2-VASc score is 3, indicating a 3.2% annual risk of stroke.    She is in Afib today.  Education provided about Afib with visual diagram. Discussion about medication treatments and ablation in detail. After discussion, we will proceed with scheduling DCCV.  She took two full does of Eliquis on 4/3 so will schedule 3 weeks from then. Obtain labs prior to DCCV. She will follow up with Dr. Branch as scheduled.  I will see her in 6 months or sooner if needed.   2. Secondary Hypercoagulable State (ICD10:  D68.69) The patient is at significant risk for stroke/thromboembolism based upon her CHA2DS2-VASc Score of 3.  Continue Apixaban (Eliquis).   Education on compliance provided. No missed doses.    Will schedule DCCV. Follow up with Dr. Branch after DCCV as scheduled. F/U Afib clinic 6 months.   Faigy Stretch, PA-C [facility] [address] [place], [place] [address] [phone] 08/25/2022 8:49 AM  

## 2022-08-25 NOTE — H&P (View-Only) (Signed)
Primary Care Physician: Donita Brooks, MD Primary Cardiologist: Dr. Wyline Mood Primary Electrophysiologist: None Referring Physician:    [name] is a 72 y.o. 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 The patient was initially diagnosed with atrial fibrillation on 3/25 by PCP and was started on Eliquis 5 mg BID. Patient admits to not starting anticoagulation at that time yet. She saw PCP on f/u 3/28 and Toprol was increased to 100 mg daily. She presented to the ED at [facility] on 4/2 with chest pain and shortness of breath. Review of records show she took two full doses of Eliquis on 4/3. She did not have surgery for vascular finding, believed to be chronic, and has follow up with them outpatient for repeat CT. She was discharged on diltiazem 60 mg BID and Lopressor 25 mg BID. Patient is on Eliquis 5 mg BID for a CHADS2VASC score of 3.  On evaluation today, she is currently in Afib. She states she does not really feel it at this time. She states it is not like when she had chest pain and shortness of breath on day of ED visit. She is transitioning to Dr. Wyline Mood for new cardiologist. She feels tired when she does activity but otherwise in office feels okay. She does not drink alcohol. She does not snore or stop breathing per husband.   Mother and aunt with history of Afib.  She is compliant with anticoagulation and has not missed any doses. She has no bleeding concerns.  Today, she denies symptoms of palpitations, orthopnea, PND, lower extremity edema, dizziness, presyncope, syncope, snoring, daytime somnolence, bleeding, or neurologic sequela. The patient is tolerating medications without difficulties and is otherwise without complaint today.   Atrial  Fibrillation Risk Factors:  she does not have symptoms or diagnosis of sleep apnea. she does not have a history of rheumatic fever. she does not have a history of alcohol use. The patient does have a history of early familial atrial fibrillation or other arrhythmias.  she has a BMI of Body mass index is 23.37 kg/m.Marland Kitchen Filed Weights   08/25/22 0834  Weight: 58 kg    Family History  Problem Relation Age of Onset   Arthritis Mother    Heart disease Mother    Vision loss Mother    Stroke Mother    Depression Sister    Transient ischemic attack Sister 71   Depression Sister    Cancer Maternal Grandmother    Cancer Maternal Grandfather    Early death Maternal Grandfather    Heart disease Maternal Grandfather    Early death Paternal Grandfather    Heart disease Paternal Grandfather    Diabetes Other        Juvenile   Colon cancer Neg Hx    Rectal cancer Neg Hx    Prostate cancer Neg Hx      Atrial Fibrillation Management history:  Previous antiarrhythmic drugs: None Previous cardioversions: None Previous ablations: None Anticoagulation history: 3   Past Medical History:  Diagnosis Date   Adenomatous colon polyp    Allergy    Anemia    as a child, no current problem   Atrial fibrillation    Blood transfusion without reported diagnosis    21 months old   Cataract    bilateral - MD just watching  GERD (gastroesophageal reflux disease)    Hyperlipidemia    Osteoporosis 04/15/2016   Skin cancer 2013   Skin cancer- Nose, face   Past Surgical History:  Procedure Laterality Date   CESAREAN SECTION  1981   x 1   COLONOSCOPY  2016   hx polyp, tics, sigmoid colon mod tortuous Dr Dory Peru,    EYE SURGERY Bilateral    retina repair x 2   LAPAROTOMY  1979   removal of ovarian cyst   nissan fundiplication  05/17/1997   REPAIR OF COMPLEX TRACTION RETINAL DETACHMENT Bilateral 01/2019    Current Outpatient Medications  Medication Sig Dispense Refill   alendronate  (FOSAMAX) 70 MG tablet TAKE 1 TABLET BY MOUTH ONCE A WEEK ON AN EMPTY STOMACH WITH A FULL GLASS OF WATER 12 tablet 3   apixaban (ELIQUIS) 5 MG TABS tablet Take 1 tablet (5 mg total) by mouth 2 (two) times daily. 60 tablet 3   Biotin 1000 MCG tablet Take 1,000 mcg by mouth daily.     Calcium Carb-Cholecalciferol (CALCIUM 600 + D PO) Take 600 mg by mouth once.     chlorpheniramine (CHLOR-TRIMETON) 4 MG tablet Take 4 mg by mouth 2 (two) times daily as needed for allergies.     Cholecalciferol (VITAMIN D) 50 MCG (2000 UT) CAPS Take 2,000 Units by mouth daily.     co-enzyme Q-10 30 MG capsule Take 30 mg by mouth every morning.     Cranberry-Cholecalciferol 4200-500 MG-UNIT CAPS Take 1 tablet by mouth daily.     diltiazem (CARDIZEM SR) 60 MG 12 hr capsule Take 1 capsule (60 mg total) by mouth every 12 (twelve) hours. 60 capsule 1   furosemide (LASIX) 40 MG tablet Take 1 tablet (40 mg total) by mouth daily. 30 tablet 3   Methylcellulose, Laxative, (FIBER THERAPY PO) Take 1 Scoop by mouth daily.     metoprolol tartrate (LOPRESSOR) 25 MG tablet Take 1 tablet (25 mg total) by mouth 2 (two) times daily. 60 tablet 1   Multiple Vitamin (MULTIVITAMIN) tablet Take 1 tablet by mouth daily.     Probiotic Product (PROBIOTIC PO) Take 1 tablet by mouth daily.     vitamin C (ASCORBIC ACID) 500 MG tablet Take 500 mg by mouth daily.     docusate sodium (COLACE) 250 MG capsule Take 250 mg by mouth daily. (Patient not taking: Reported on 08/25/2022)     No current facility-administered medications for this encounter.    No Known Allergies  Social History   Socioeconomic History   Marital status: Married    Spouse name: Not on file   Number of children: Not on file   Years of education: Not on file   Highest education level: 12th grade  Occupational History   Not on file  Tobacco Use   Smoking status: Never   Smokeless tobacco: Never  Vaping Use   Vaping Use: Never used  Substance and Sexual Activity    Alcohol use: Yes    Alcohol/week: 1.0 standard drink of alcohol    Types: 1 Glasses of wine per week    Comment: rarely   Drug use: No   Sexual activity: Not Currently    Birth control/protection: Post-menopausal  Other Topics Concern   Not on file  Social History Narrative   Not on file   Social Determinants of Health   Financial Resource Strain: Low Risk  (08/09/2022)   Overall Financial Resource Strain (CARDIA)    Difficulty of Paying Living Expenses:  Not hard at all  Food Insecurity: No Food Insecurity (08/23/2022)   Hunger Vital Sign    Worried About Running Out of Food in the Last Year: Never true    Ran Out of Food in the Last Year: Never true  Transportation Needs: No Transportation Needs (08/09/2022)   PRAPARE - Administrator, Civil Service (Medical): No    Lack of Transportation (Non-Medical): No  Physical Activity: Unknown (08/09/2022)   Exercise Vital Sign    Days of Exercise per Week: 3 days    Minutes of Exercise per Session: Not on file  Stress: No Stress Concern Present (08/09/2022)   [employer] of Occupational Health - Occupational Stress Questionnaire    Feeling of Stress : Only a little  Social Connections: Moderately Integrated (08/09/2022)   Social Connection and Isolation Panel [NHANES]    Frequency of Communication with Friends and Family: Three times a week    Frequency of Social Gatherings with Friends and Family: Twice a week    Attends Religious Services: 1 to 4 times per year    Active Member of [employer] or Organizations: No    Attends Engineer, structural: Not on file    Marital Status: Married  Catering manager Violence: Not on file    ROS- All systems are reviewed and negative except as per the HPI above.  Physical Exam: Vitals:   08/25/22 0834  BP: 114/76  Pulse: (!) 114  Weight: 58 kg  Height: 5\' 2"  (1.575 m)    GEN- The patient is a well appearing female, alert and oriented x 3 today.   Head- normocephalic,  atraumatic Eyes-  Sclera clear, conjunctiva pink Ears- hearing intact Oropharynx- clear Neck- supple  Lungs- Clear to ausculation bilaterally, normal work of breathing Heart- Irregular rate and rhythm, no murmurs, rubs or gallops  GI- soft, NT, ND, + BS Extremities- no clubbing, cyanosis, or edema MS- no significant deformity or atrophy Skin- no rash or lesion Psych- euthymic mood, full affect Neuro- strength and sensation are intact  Wt Readings from Last 3 Encounters:  08/25/22 58 kg  08/20/22 59.2 kg  08/12/22 58.1 kg    EKG today demonstrates Afib with RVR HR 114 QRS 84 ms QT/Qtc 326/449 ms  Echo 08/18/22 demonstrated:  1. Left ventricular ejection fraction, by estimation, is 60 to 65%. The  left ventricle has normal function. The left ventricle has no regional  wall motion abnormalities. Left ventricular diastolic function could not  be evaluated.   2. Right ventricular systolic function is normal. The right ventricular  size is normal. There is normal pulmonary artery systolic pressure. The  estimated right ventricular systolic pressure is 26.5 mmHg.   3. Left atrial size was mild to moderately dilated.   4. Right atrial size was mildly dilated.   5. The mitral valve is normal in structure. Mild mitral valve  regurgitation. No evidence of mitral stenosis.   6. Tricuspid valve regurgitation is mild to moderate.   7. The aortic valve is tricuspid. Aortic valve regurgitation is not  visualized. No aortic stenosis is present.   8. The inferior vena cava is dilated in size with >50% respiratory  variability, suggesting right atrial pressure of 8 mmHg.   Epic records are reviewed at length today.  CHA2DS2-VASc Score = 3  The patient's score is based upon: CHF History: 0 HTN History: 0 Diabetes History: 0 Stroke History: 0 Vascular Disease History: 1 Age Score: 1 Gender Score: 1  ASSESSMENT AND PLAN: Persistent Atrial Fibrillation (ICD10:  I48.19) The  patient's CHA2DS2-VASc score is 3, indicating a 3.2% annual risk of stroke.    She is in Afib today.  Education provided about Afib with visual diagram. Discussion about medication treatments and ablation in detail. After discussion, we will proceed with scheduling DCCV.  She took two full does of Eliquis on 4/3 so will schedule 3 weeks from then. Obtain labs prior to DCCV. She will follow up with Dr. Wyline MoodBranch as scheduled.  I will see her in 6 months or sooner if needed.   2. Secondary Hypercoagulable State (ICD10:  D68.69) The patient is at significant risk for stroke/thromboembolism based upon her CHA2DS2-VASc Score of 3.  Continue Apixaban (Eliquis).   Education on compliance provided. No missed doses.    Will schedule DCCV. Follow up with Dr. Wyline MoodBranch after DCCV as scheduled. F/U Afib clinic 6 months.   Lake BellsJoseph Shakeem Stern, PA-C Afib Clinic [facility] [address] [place], [place] [address] [phone] 08/25/2022 8:49 AM

## 2022-08-27 ENCOUNTER — Ambulatory Visit (INDEPENDENT_AMBULATORY_CARE_PROVIDER_SITE_OTHER): Payer: Medicare Other | Admitting: Family Medicine

## 2022-08-27 ENCOUNTER — Encounter: Payer: Self-pay | Admitting: Family Medicine

## 2022-08-27 VITALS — BP 126/72 | HR 84 | Temp 97.6°F | Ht 62.0 in | Wt 127.0 lb

## 2022-08-27 DIAGNOSIS — I4891 Unspecified atrial fibrillation: Secondary | ICD-10-CM

## 2022-08-27 DIAGNOSIS — I71012 Dissection of descending thoracic aorta: Secondary | ICD-10-CM

## 2022-08-27 DIAGNOSIS — I7123 Aneurysm of the descending thoracic aorta, without rupture: Secondary | ICD-10-CM | POA: Diagnosis not present

## 2022-08-27 NOTE — Progress Notes (Signed)
Subjective:    Patient ID: Terri Alexander, female    DOB: 21-Jul-1950, 72 y.o.   MRN: [id number]   Admit date: 08/17/2022   Discharge date: 08/20/2022   Admitted From: Home.   Disposition:  Home.   Recommendations for Outpatient Follow-up:  Follow up with PCP in 1-2 weeks. Please obtain BMP/CBC in one week. Advised to follow-up with Cardiology (atrial fibrillation clinic in 2 weeks). Advised to take metoprolol 25 mg twice daily, Cardizem 60 mg every 12 hours for atrial fibrillation Advised to take Eliquis 5 mg twice daily for anticoagulation for A-fib. Advised to take additional dose of metoprolol for heart rate, not controlled.   Home Health:None Equipment/Devices:None   Discharge Condition: Stable CODE STATUS:Full code Diet recommendation: Heart Healthy    Brief [facility] Course: This 72 yrs old female with PMH significant for atrial fibrillation, Hyperlipidemia, GERD and recent Covid (1 month ago) who presented to the ED with the acute onset of pleuritic chest pain and shortness of breath. She was recently diagnosed with atrial fibrillation and started on toprol XL and was prescribed Eliquis, though she did not start this and has been out of cardizem. She presented to the ED with palpitations but otherwise hemodynamically stable.  Labs significant for WBC 18k, BNP 382, troponin WNL,  CTA of the chest/abdomen and pelvis was obtained showing thoracic aortic aneurysm 5.6cm with suspected thrombosed chronic type B dissection extending from the distal arch to the level of T5-6.  She was started on a cardene drip and given IV cardizem and transferred to [facility] for vascular surgery evaluation and admission.  Vascular surgery was consulted. Vascular surgery recommended no acute surgical intervention needed, recommended outpatient repeat CT scan and follow up. She incidentally notes a small amount of red blood in her stool the last few days with possible proctitis seen on CT. She was  started on IV antibiotics. Cardiology was consulted.  Patient was started on metoprolol and Cardizem.  Also resumed on Eliquis.  Patient felt better , heart rate reasonably controlled.  Patient wanted to be discharged,  cleared from cardiology for discharge.     Discharge Diagnoses:  Principal Problem:   Aortic dissection Active Problems:   Aortic dissection distal to left subclavian   Atrial fibrillation with RVR     Descending thoracic aortic aneurysm with chronic type B aortic dissection: Patient presented with pleuritic chest pain associated with shortness of breath. CT chest showed descending aortic aneurysm with chronic type B aortic dissection. Vascular surgery consult appreciated.  Recommended outpatient follow-up,  No inpatient surgical intervention needed. Continue adequate pain control. As per Dr. Edilia Bo, okay to start anticoagulation for atrial fibrillation. She does not require very strict blood pressure and heart rate goals.   Patient is started on Eliquis. She reports chest pain has resolved.   Paroxysmal atrial fibrillation: HR well controlled. Continue Eliquis twice daily Continue telemetry. Continue metoprolol 25 mg twice daily. Cardiology is consulted to help with the rate and rhythm control given difficulty as outpatient with rate control. Started on Cardizem 60 mg every 12 hours. HR controlled, Cardiology signed off. Patient is being discharged home.   Proctitis. Continue ceftriaxone and Flagyl for 3 days. FOBT negative. She reports feeling better.   Cough secondary to recent COVID infection: Continue a trial of inhaled corticosteroid twice daily for a month  08/27/22 Patient seems to be doing very well since she left the hospital.  She has been watching her heart rate consistently.  Her  heart rate has been averaging between 60 and 100 with just an occasional episode greater than 100.  She is tolerating the metoprolol 25 mg twice a day and Cardizem 60 mg  twice a day well.  Her blood pressure however has been soft.  Her systolic blood pressures have been averaging between 90 and 110.  Diastolic blood pressures are outstanding in the 60s and 70s.  She has not had any episodes of syncope or near syncope.  Her cough has improved dramatically on the inhaled corticosteroid.  She continues to have some pressure and pain in between her shoulder blades which I believe is most likely due to to the aortic dissection although this is much better Past Medical History:  Diagnosis Date   Adenomatous colon polyp    Allergy    Anemia    as a child, no current problem   Atrial fibrillation    Blood transfusion without reported diagnosis    36 months old   Cataract    bilateral - MD just watching    GERD (gastroesophageal reflux disease)    Hyperlipidemia    Osteoporosis 04/15/2016   Skin cancer 2013   Skin cancer- Nose, face   Current Outpatient Medications on File Prior to Visit  Medication Sig Dispense Refill   alendronate (FOSAMAX) 70 MG tablet TAKE 1 TABLET BY MOUTH ONCE A WEEK ON AN EMPTY STOMACH WITH A FULL GLASS OF WATER 12 tablet 3   apixaban (ELIQUIS) 5 MG TABS tablet Take 1 tablet (5 mg total) by mouth 2 (two) times daily. 180 tablet 1   Biotin 1000 MCG tablet Take 1,000 mcg by mouth daily.     Calcium Carb-Cholecalciferol (CALCIUM 600 + D PO) Take 600 mg by mouth once.     Cholecalciferol (VITAMIN D) 50 MCG (2000 UT) CAPS Take 2,000 Units by mouth daily.     co-enzyme Q-10 30 MG capsule Take 30 mg by mouth every morning.     Cranberry-Cholecalciferol 4200-500 MG-UNIT CAPS Take 1 tablet by mouth daily.     diltiazem (CARDIZEM SR) 60 MG 12 hr capsule Take 1 capsule (60 mg total) by mouth every 12 (twelve) hours. 180 capsule 1   fluticasone (FLOVENT HFA) 110 MCG/ACT inhaler Inhale into the lungs 2 (two) times daily.     furosemide (LASIX) 40 MG tablet Take 1 tablet (40 mg total) by mouth daily. 30 tablet 3   metoprolol tartrate (LOPRESSOR) 25 MG  tablet Take 1 tablet (25 mg total) by mouth 2 (two) times daily. 180 tablet 1   Multiple Vitamin (MULTIVITAMIN) tablet Take 1 tablet by mouth daily.     Probiotic Product (PROBIOTIC PO) Take 1 tablet by mouth daily.     vitamin C (ASCORBIC ACID) 500 MG tablet Take 500 mg by mouth daily.     Methylcellulose, Laxative, (FIBER THERAPY PO) Take 1 Scoop by mouth daily. (Patient not taking: Reported on 08/27/2022)     No current facility-administered medications on file prior to visit.   No Known Allergies Social History   Socioeconomic History   Marital status: Married    Spouse name: Not on file   Number of children: Not on file   Years of education: Not on file   Highest education level: 12th grade  Occupational History   Not on file  Tobacco Use   Smoking status: Never   Smokeless tobacco: Never  Vaping Use   Vaping Use: Never used  Substance and Sexual Activity   Alcohol use: Yes  Alcohol/week: 1.0 standard drink of alcohol    Types: 1 Glasses of wine per week    Comment: rarely   Drug use: No   Sexual activity: Not Currently    Birth control/protection: Post-menopausal  Other Topics Concern   Not on file  Social History Narrative   Not on file   Social Determinants of Health   Financial Resource Strain: Low Risk  (08/09/2022)   Overall Financial Resource Strain (CARDIA)    Difficulty of Paying Living Expenses: Not hard at all  Food Insecurity: No Food Insecurity (08/23/2022)   Hunger Vital Sign    Worried About Running Out of Food in the Last Year: Never true    Ran Out of Food in the Last Year: Never true  Transportation Needs: No Transportation Needs (08/09/2022)   PRAPARE - Administrator, Civil Service (Medical): No    Lack of Transportation (Non-Medical): No  Physical Activity: Unknown (08/09/2022)   Exercise Vital Sign    Days of Exercise per Week: 3 days    Minutes of Exercise per Session: Not on file  Stress: No Stress Concern Present (08/09/2022)    [employer] of Occupational Health - Occupational Stress Questionnaire    Feeling of Stress : Only a little  Social Connections: Moderately Integrated (08/09/2022)   Social Connection and Isolation Panel [NHANES]    Frequency of Communication with Friends and Family: Three times a week    Frequency of Social Gatherings with Friends and Family: Twice a week    Attends Religious Services: 1 to 4 times per year    Active Member of [employer] or Organizations: No    Attends Engineer, structural: Not on file    Marital Status: Married  Catering manager Violence: Not on file      Review of Systems  Respiratory:  Positive for cough.   All other systems reviewed and are negative.      Objective:   Physical Exam Vitals reviewed.  Constitutional:      General: She is not in acute distress.    Appearance: Normal appearance. She is not ill-appearing, toxic-appearing or diaphoretic.  HENT:     Head: Normocephalic and atraumatic.     Nose: Nose normal.     Mouth/Throat:     Mouth: Mucous membranes are moist.     Pharynx: Oropharynx is clear. No oropharyngeal exudate or posterior oropharyngeal erythema.  Eyes:     Extraocular Movements: Extraocular movements intact.     Conjunctiva/sclera: Conjunctivae normal.     Pupils: Pupils are equal, round, and reactive to light.  Neck:     Vascular: No carotid bruit.  Cardiovascular:     Rate and Rhythm: Normal rate. Rhythm irregular.     Pulses: Normal pulses.     Heart sounds: Normal heart sounds. No murmur heard.    No friction rub. No gallop.  Pulmonary:     Effort: Pulmonary effort is normal. No respiratory distress.     Breath sounds: No stridor. No wheezing, rhonchi or rales.  Chest:     Chest wall: No tenderness.  Abdominal:     General: Abdomen is flat. Bowel sounds are normal. There is no distension.     Palpations: Abdomen is soft. There is no mass.     Tenderness: There is no abdominal tenderness. There is no  guarding or rebound.     Hernia: No hernia is present.  Musculoskeletal:     Cervical back: Normal range of motion  and neck supple. No tenderness.     Right lower leg: No edema.     Left lower leg: No edema.  Lymphadenopathy:     Cervical: No cervical adenopathy.  Skin:    General: Skin is warm.     Coloration: Skin is not jaundiced or pale.     Findings: No bruising, erythema, lesion or rash.  Neurological:     General: No focal deficit present.     Mental Status: She is alert and oriented to person, place, and time. Mental status is at baseline.     Cranial Nerves: No cranial nerve deficit.     Sensory: No sensory deficit.     Motor: No weakness.     Coordination: Coordination normal.     Gait: Gait normal.     Deep Tendon Reflexes: Reflexes normal.  Psychiatric:        Mood and Affect: Mood normal.        Behavior: Behavior normal.        Thought Content: Thought content normal.        Judgment: Judgment normal.           Assessment & Plan:  Dissection of descending thoracic aorta - Plan: CBC with Differential/Platelet, BASIC METABOLIC PANEL WITH GFR  Atrial fibrillation, unspecified type  Aneurysm of descending thoracic aorta, unspecified whether ruptured Cardiology is planning cardioversion in 3 months.  The patient is appropriately anticoagulated on Eliquis and her heart rate is well right now on metoprolol 25 mg twice daily and Cardizem 60 mg twice daily.  Her blood pressures are soft.  There is no evidence of pulmonary edema on her exam for fluid overload so I have recommended that she hold the Lasix to avoid dehydration and hypotension.  She can use Lasix as needed for leg swelling or shortness of breath.  If she sees hypotension, we may need to switch her medication to either amiodarone or digoxin but I will defer that to cardiology's opinion.  I will check a CBC and a BMP today.  Continue inhaled fluticasone 2 puffs twice daily for the remainder of the month and then  discontinue.  Defer to vascular surgery repair of the aortic aneurysm

## 2022-08-28 LAB — CBC WITH DIFFERENTIAL/PLATELET
Absolute Monocytes: 1345 cells/uL — ABNORMAL HIGH (ref 200–950)
Basophils Absolute: 106 cells/uL (ref 0–200)
Basophils Relative: 0.9 %
Eosinophils Absolute: 0 cells/uL — ABNORMAL LOW (ref 15–500)
Eosinophils Relative: 0 %
HCT: 43.1 % (ref 35.0–45.0)
Hemoglobin: 13.9 g/dL (ref 11.7–15.5)
Lymphs Abs: 2749 cells/uL (ref 850–3900)
MCH: 29 pg (ref 27.0–33.0)
MCHC: 32.3 g/dL (ref 32.0–36.0)
MCV: 89.8 fL (ref 80.0–100.0)
MPV: 9.9 fL (ref 7.5–12.5)
Monocytes Relative: 11.4 %
Neutro Abs: 7599 cells/uL (ref 1500–7800)
Neutrophils Relative %: 64.4 %
Platelets: 589 10*3/uL — ABNORMAL HIGH (ref 140–400)
RBC: 4.8 10*6/uL (ref 3.80–5.10)
RDW: 13.1 % (ref 11.0–15.0)
Total Lymphocyte: 23.3 %
WBC: 11.8 10*3/uL — ABNORMAL HIGH (ref 3.8–10.8)

## 2022-08-28 LAB — BASIC METABOLIC PANEL WITH GFR
BUN/Creatinine Ratio: 33 (calc) — ABNORMAL HIGH (ref 6–22)
BUN: 26 mg/dL — ABNORMAL HIGH (ref 7–25)
CO2: 29 mmol/L (ref 20–32)
Calcium: 9.9 mg/dL (ref 8.6–10.4)
Chloride: 102 mmol/L (ref 98–110)
Creat: 0.78 mg/dL (ref 0.60–1.00)
Glucose, Bld: 79 mg/dL (ref 65–99)
Potassium: 5.4 mmol/L — ABNORMAL HIGH (ref 3.5–5.3)
Sodium: 139 mmol/L (ref 135–146)
eGFR: 81 mL/min/{1.73_m2} (ref >=60)

## 2022-09-07 ENCOUNTER — Ambulatory Visit (HOSPITAL_COMMUNITY)
Admission: RE | Admit: 2022-09-07 | Discharge: 2022-09-07 | Disposition: A | Discharge status: Home / Self Care | Payer: Medicare Other | Source: Ambulatory Visit | Attending: Internal Medicine | Admitting: Internal Medicine

## 2022-09-07 DIAGNOSIS — I4819 Other persistent atrial fibrillation: Secondary | ICD-10-CM | POA: Insufficient documentation

## 2022-09-07 LAB — CBC
HCT: 44.1 % (ref 36.0–46.0)
Hemoglobin: 14.4 g/dL (ref 12.0–15.0)
MCH: 29.6 pg (ref 26.0–34.0)
MCHC: 32.7 g/dL (ref 30.0–36.0)
MCV: 90.7 fL (ref 80.0–100.0)
Platelets: 339 10*3/uL (ref 150–400)
RBC: 4.86 MIL/uL (ref 3.87–5.11)
RDW: 15.8 % — ABNORMAL HIGH (ref 11.5–15.5)
WBC: 8.8 10*3/uL (ref 4.0–10.5)
nRBC: 0 % (ref 0.0–0.2)

## 2022-09-07 LAB — BASIC METABOLIC PANEL
Anion gap: 9 (ref 5–15)
BUN: 20 mg/dL (ref 8–23)
CO2: 25 mmol/L (ref 22–32)
Calcium: 9.8 mg/dL (ref 8.9–10.3)
Chloride: 108 mmol/L (ref 98–111)
Creatinine, Ser: 0.87 mg/dL (ref 0.44–1.00)
GFR, Estimated: >60 mL/min (ref >60)
Glucose, Bld: 116 mg/dL — ABNORMAL HIGH (ref 70–99)
Potassium: 4.9 mmol/L (ref 3.5–5.1)
Sodium: 142 mmol/L (ref 135–145)

## 2022-09-08 NOTE — Pre-Procedure Instructions (Signed)
Spoke to patient regarding instructions for cardioversion tomorrow - come at 0915, NPO after midnight, confirmed pt has ride home and responsible person to stay with them 24 hours after procedure, confirmed no missed doses of eliquis

## 2022-09-09 ENCOUNTER — Ambulatory Visit (HOSPITAL_COMMUNITY)
Admission: RE | Admit: 2022-09-09 | Discharge: 2022-09-09 | Disposition: A | Discharge status: Home / Self Care | Payer: Medicare Other | Source: Ambulatory Visit | Attending: Internal Medicine | Admitting: Internal Medicine

## 2022-09-09 ENCOUNTER — Encounter (HOSPITAL_COMMUNITY)
Admission: RE | Disposition: A | Discharge status: Home / Self Care | Payer: Self-pay | Source: Ambulatory Visit | Attending: Internal Medicine

## 2022-09-09 ENCOUNTER — Encounter (HOSPITAL_COMMUNITY): Payer: Self-pay | Admitting: Internal Medicine

## 2022-09-09 ENCOUNTER — Ambulatory Visit (HOSPITAL_COMMUNITY): Payer: Medicare Other | Admitting: Anesthesiology

## 2022-09-09 ENCOUNTER — Ambulatory Visit (HOSPITAL_BASED_OUTPATIENT_CLINIC_OR_DEPARTMENT_OTHER): Payer: Medicare Other | Admitting: Anesthesiology

## 2022-09-09 ENCOUNTER — Other Ambulatory Visit: Payer: Self-pay

## 2022-09-09 DIAGNOSIS — E785 Hyperlipidemia, unspecified: Secondary | ICD-10-CM | POA: Diagnosis not present

## 2022-09-09 DIAGNOSIS — I081 Rheumatic disorders of both mitral and tricuspid valves: Secondary | ICD-10-CM | POA: Diagnosis not present

## 2022-09-09 DIAGNOSIS — I4891 Unspecified atrial fibrillation: Secondary | ICD-10-CM | POA: Diagnosis not present

## 2022-09-09 DIAGNOSIS — Z79899 Other long term (current) drug therapy: Secondary | ICD-10-CM | POA: Insufficient documentation

## 2022-09-09 DIAGNOSIS — D6869 Other thrombophilia: Secondary | ICD-10-CM | POA: Diagnosis not present

## 2022-09-09 DIAGNOSIS — Z7901 Long term (current) use of anticoagulants: Secondary | ICD-10-CM | POA: Diagnosis not present

## 2022-09-09 DIAGNOSIS — I4819 Other persistent atrial fibrillation: Secondary | ICD-10-CM | POA: Diagnosis not present

## 2022-09-09 DIAGNOSIS — K219 Gastro-esophageal reflux disease without esophagitis: Secondary | ICD-10-CM | POA: Insufficient documentation

## 2022-09-09 HISTORY — PX: CARDIOVERSION: SHX1299

## 2022-09-09 SURGERY — CARDIOVERSION
Anesthesia: General

## 2022-09-09 MED ORDER — LIDOCAINE HCL (CARDIAC) PF 100 MG/5ML IV SOSY
PREFILLED_SYRINGE | INTRAVENOUS | Status: DC | PRN
  Administered 2022-09-09: 60 mg via INTRAVENOUS

## 2022-09-09 MED ORDER — PROPOFOL 10 MG/ML IV BOLUS
INTRAVENOUS | Status: DC | PRN
  Administered 2022-09-09: 70 mg via INTRAVENOUS

## 2022-09-09 MED ORDER — SODIUM CHLORIDE 0.9 % IV SOLN: INTRAVENOUS | Status: DC

## 2022-09-09 SURGICAL SUPPLY — 1 items: ELECT DEFIB PAD ADLT CADENCE (PAD) ×1 IMPLANT

## 2022-09-09 NOTE — CV Procedure (Signed)
Procedure: Electrical Cardioversion Indications:  Atrial Fibrillation  Procedure Details:  Consent: Risks of procedure as well as the alternatives and risks of each were explained to the (patient/caregiver).  Consent for procedure obtained.  Time Out: Verified patient identification, verified procedure, site/side was marked, verified correct patient position, special equipment/implants available, medications/allergies/relevent history reviewed, required imaging and test results available. PERFORMED.  Patient placed on cardiac monitor, pulse oximetry, supplemental oxygen as necessary.  Sedation given:  70 mg propofol Pacer pads placed anterior and posterior chest.  Cardioverted 2 time(s).  Cardioversion with synchronized biphasic 200J x2 shock.  Evaluation: Findings: Post procedure EKG shows: NSR Complications: None Patient did tolerate procedure well.  Time Spent Directly with the Patient:  15 minutes   Terri Alexander 09/09/2022, 10:09 AM

## 2022-09-09 NOTE — Transfer of Care (Signed)
Immediate Anesthesia Transfer of Care Note  Patient: Terri Alexander  Procedure(s) Performed: CARDIOVERSION  Patient Location: PACU and Cath Lab  Anesthesia Type:General  Level of Consciousness: drowsy  Airway & Oxygen Therapy: Patient Spontanous Breathing and Patient connected to nasal cannula oxygen  Post-op Assessment: Report given to RN and Post -op Vital signs reviewed and stable  Post vital signs: Reviewed and stable  Last Vitals:  Vitals Value Taken Time  BP    Temp    Pulse    Resp    SpO2      Last Pain:  Vitals:   09/09/22 0907  TempSrc: Temporal         Complications: No notable events documented.

## 2022-09-09 NOTE — Anesthesia Preprocedure Evaluation (Signed)
Anesthesia Evaluation  Patient identified by MRN, date of birth, ID band Patient awake    Reviewed: Allergy & Precautions, H&P , NPO status , Patient's Chart, lab work & pertinent test results  Airway Mallampati: II  TM Distance: >3 FB Neck ROM: Full    Dental no notable dental hx.    Pulmonary neg pulmonary ROS   Pulmonary exam normal breath sounds clear to auscultation       Cardiovascular + dysrhythmias Atrial Fibrillation + Valvular Problems/Murmurs MR  Rhythm:Irregular Rate:Normal + Systolic murmurs 1. Left ventricular ejection fraction, by estimation, is 60 to 65%. The  left ventricle has normal function. The left ventricle has no regional  wall motion abnormalities. Left ventricular diastolic function could not  be evaluated.   2. Right ventricular systolic function is normal. The right ventricular  size is normal. There is normal pulmonary artery systolic pressure. The  estimated right ventricular systolic pressure is 26.5 mmHg.   3. Left atrial size was mild to moderately dilated.   4. Right atrial size was mildly dilated.   5. The mitral valve is normal in structure. Mild mitral valve  regurgitation. No evidence of mitral stenosis.   6. Tricuspid valve regurgitation is mild to moderate.   7. The aortic valve is tricuspid. Aortic valve regurgitation is not  visualized. No aortic stenosis is present.   8. The inferior vena cava is dilated in size with >50% respiratory  variability, suggesting right atrial pressure of 8 mmHg.     Neuro/Psych negative neurological ROS  negative psych ROS   GI/Hepatic Neg liver ROS,GERD  ,,  Endo/Other  negative endocrine ROS    Renal/GU negative Renal ROS  negative genitourinary   Musculoskeletal negative musculoskeletal ROS (+)    Abdominal   Peds negative pediatric ROS (+)  Hematology negative hematology ROS (+)   Anesthesia Other Findings    Reproductive/Obstetrics negative OB ROS                             Anesthesia Physical Anesthesia Plan  ASA: 2  Anesthesia Plan: General   Post-op Pain Management: Minimal or no pain anticipated   Induction: Intravenous  PONV Risk Score and Plan: 3 and Treatment may vary due to age or medical condition  Airway Management Planned: Mask  Additional Equipment:   Intra-op Plan:   Post-operative Plan:   Informed Consent: I have reviewed the patients History and Physical, chart, labs and discussed the procedure including the risks, benefits and alternatives for the proposed anesthesia with the patient or authorized representative who has indicated his/her understanding and acceptance.     Dental advisory given  Plan Discussed with: CRNA and Surgeon  Anesthesia Plan Comments:        Anesthesia Quick Evaluation

## 2022-09-09 NOTE — Interval H&P Note (Signed)
History and Physical Interval Note:  09/09/2022 9:28 AM  Terri Alexander  has presented today for surgery, with the diagnosis of AFIB.  The various methods of treatment have been discussed with the patient and family. After consideration of risks, benefits and other options for treatment, the patient has consented to  Procedure(s): CARDIOVERSION (N/A) as a surgical intervention.  The patient's history has been reviewed, patient examined, no change in status, stable for surgery.  I have reviewed the patient's chart and labs.  Questions were answered to the patient's satisfaction.     [name]

## 2022-09-10 NOTE — Anesthesia Postprocedure Evaluation (Signed)
Anesthesia Post Note  Patient: Terri Alexander  Procedure(s) Performed: CARDIOVERSION     Patient location during evaluation: PACU Anesthesia Type: General Level of consciousness: awake and alert Pain management: pain level controlled Vital Signs Assessment: post-procedure vital signs reviewed and stable Respiratory status: spontaneous breathing, nonlabored ventilation, respiratory function stable and patient connected to nasal cannula oxygen Cardiovascular status: blood pressure returned to baseline and stable Postop Assessment: no apparent nausea or vomiting Anesthetic complications: no  No notable events documented.  Last Vitals:  Vitals:   09/09/22 1036 09/09/22 1040  BP:  114/74  Pulse:  (!) 57  Resp:  18  Temp: (!) 36.4 C   SpO2:  97%    Last Pain:  Vitals:   09/09/22 1036  TempSrc: Temporal                 Shanard Treto S

## 2022-09-15 NOTE — Progress Notes (Unsigned)
Cardiology Office Note:    Date:  09/16/2022   ID:  Terri Alexander, DOB 08-28-1950, MRN [id number]  PCP:  Donita Brooks, MD   [facility] Providers Cardiologist:  [name], MD     Referring MD: Donita Brooks, MD   Chief Complaint  Patient presents with   Chest Pain    HAVING CHEST PRESSURE.  Persistent Atrial Fibrillation  History of Present Illness:    Terri Alexander is a 72 y.o. female with a hx of  HLD, GERD, descending thoracic aortic aneurysm versus chronic type B aortic dissection, significant laminated thrombus within aneurysm, and persistent atrial fibrillation. She was hospitalized 4/5 with afib. She was having SOB. Was treated for PNA AS well. Initially diagonsed 08/09/2022. She was seen in afib clinic. She's been on eliquis and diltiazem and BB. I cardioverted her 09/09/2022 to NSR.    She notes last Friday she may have gone back into afib. States not breathing as deep. No snoring or apnea. Blood pressure is normal  Past Medical History:  Diagnosis Date   Adenomatous colon polyp    Allergy    Anemia    as a child, no current problem   Atrial fibrillation (HCC)    Blood transfusion without reported diagnosis    54 months old   Cataract    bilateral - MD just watching    GERD (gastroesophageal reflux disease)    Hyperlipidemia    Osteoporosis 04/15/2016   Skin cancer 2013   Skin cancer- Nose, face    Past Surgical History:  Procedure Laterality Date   CARDIOVERSION N/A 09/09/2022   Procedure: CARDIOVERSION;  Surgeon: [name], MD;  Location: [facility] INVASIVE CV LAB;  Service: Cardiovascular;  Laterality: N/A;   CESAREAN SECTION  1981   x 1   COLONOSCOPY  2016   hx polyp, tics, sigmoid colon mod tortuous Dr Dory Peru,    EYE SURGERY Bilateral    retina repair x 2   LAPAROTOMY  1979   removal of ovarian cyst   nissan fundiplication  05/17/1997   REPAIR OF COMPLEX TRACTION RETINAL DETACHMENT Bilateral 01/2019    Current  Medications: Current Outpatient Medications on File Prior to Visit  Medication Sig Dispense Refill   acetaminophen (TYLENOL) 500 MG tablet Take 1,000 mg by mouth every 6 (six) hours as needed for moderate pain.     alendronate (FOSAMAX) 70 MG tablet TAKE 1 TABLET BY MOUTH ONCE A WEEK ON AN EMPTY STOMACH WITH A FULL GLASS OF WATER 12 tablet 3   apixaban (ELIQUIS) 5 MG TABS tablet Take 1 tablet (5 mg total) by mouth 2 (two) times daily. 180 tablet 1   Ascorbic Acid (VITAMIN C) 1000 MG tablet Take 1,000 mg by mouth daily.     Biotin 1000 MCG tablet Take 1,000 mcg by mouth daily.     Calcium Carb-Cholecalciferol (CALCIUM 600 + D PO) Take 1 tablet by mouth daily.     Calcium Polycarbophil (FIBER-CAPS PO) Take 1 capsule by mouth daily.     cetirizine (ZYRTEC) 10 MG tablet Take 10 mg by mouth daily.     Cholecalciferol (VITAMIN D3) 250 MCG (10000 UT) capsule Take 10,000 Units by mouth daily.     Cranberry 500 MG CHEW Chew 500 mg by mouth daily.     diltiazem (CARDIZEM SR) 60 MG 12 hr capsule Take 1 capsule (60 mg total) by mouth every 12 (twelve) hours. 180 capsule 1   docusate sodium (COLACE)  100 MG capsule Take 100 mg by mouth daily.     famotidine (PEPCID) 20 MG tablet Take 20 mg by mouth daily as needed for heartburn or indigestion.     furosemide (LASIX) 40 MG tablet Take 1 tablet (40 mg total) by mouth daily. (Patient taking differently: Take 40 mg by mouth daily as needed for edema.) 30 tablet 3   Lidocaine (SALONPAS PAIN RELIEVING EX) Apply 1 Application topically daily as needed (pain).     Menthol-Methyl Salicylate (SALONPAS PAIN RELIEF PATCH EX) Apply 1 patch topically daily as needed (pain).     Multiple Vitamin (MULTIVITAMIN) tablet Take 1 tablet by mouth daily.     omeprazole (PRILOSEC OTC) 20 MG tablet Take 20 mg by mouth daily as needed (acid reflux).     Probiotic Product (PROBIOTIC PO) Take 1 tablet by mouth daily.     No current facility-administered medications on file prior to  visit.     Allergies:   Patient has no known allergies.   Social History   Socioeconomic History   Marital status: Married    Spouse name: Not on file   Number of children: Not on file   Years of education: Not on file   Highest education level: 12th grade  Occupational History   Not on file  Tobacco Use   Smoking status: Never   Smokeless tobacco: Never  Vaping Use   Vaping Use: Never used  Substance and Sexual Activity   Alcohol use: Yes    Alcohol/week: 1.0 standard drink of alcohol    Types: 1 Glasses of wine per week    Comment: rarely   Drug use: No   Sexual activity: Not Currently    Birth control/protection: Post-menopausal  Other Topics Concern   Not on file  Social History Narrative   Not on file   Social Determinants of Health   Financial Resource Strain: Low Risk  (08/09/2022)   Overall Financial Resource Strain (CARDIA)    Difficulty of Paying Living Expenses: Not hard at all  Food Insecurity: No Food Insecurity (08/23/2022)   Hunger Vital Sign    Worried About Running Out of Food in the Last Year: Never true    Ran Out of Food in the Last Year: Never true  Transportation Needs: No Transportation Needs (08/09/2022)   PRAPARE - Administrator, Civil Service (Medical): No    Lack of Transportation (Non-Medical): No  Physical Activity: Unknown (08/09/2022)   Exercise Vital Sign    Days of Exercise per Week: 3 days    Minutes of Exercise per Session: Not on file  Stress: No Stress Concern Present (08/09/2022)   [employer] of Occupational Health - Occupational Stress Questionnaire    Feeling of Stress : Only a little  Social Connections: Moderately Integrated (08/09/2022)   Social Connection and Isolation Panel [NHANES]    Frequency of Communication with Friends and Family: Three times a week    Frequency of Social Gatherings with Friends and Family: Twice a week    Attends Religious Services: 1 to 4 times per year    Active Member of  [employer] or Organizations: No    Attends Engineer, structural: Not on file    Marital Status: Married     Family History: The patient's family history includes Arthritis in her mother; Cancer in her maternal grandfather and maternal grandmother; Depression in her sister and sister; Diabetes in an other family member; Early death in her maternal grandfather and  paternal grandfather; Heart disease in her maternal grandfather, mother, and paternal grandfather; Stroke in her mother; Transient ischemic attack (age of onset: 84) in her sister; Vision loss in her mother. There is no history of Colon cancer, Rectal cancer, or Prostate cancer.  ROS:   Please see the history of present illness.     All other systems reviewed and are negative.  EKGs/Labs/Other Studies Reviewed:    The following studies were reviewed today:   EKG:  EKG is  ordered today.  The ekg ordered today demonstrates   09/16/2022- afib rate 95 bpm  Echo 08/18/22 demonstrated:  1. Left ventricular ejection fraction, by estimation, is 60 to 65%. The  left ventricle has normal function. The left ventricle has no regional  wall motion abnormalities. Left ventricular diastolic function could not  be evaluated.   2. Right ventricular systolic function is normal. The right ventricular  size is normal. There is normal pulmonary artery systolic pressure. The  estimated right ventricular systolic pressure is 26.5 mmHg.   3. Left atrial size was mild to moderately dilated.   4. Right atrial size was mildly dilated.   5. The mitral valve is normal in structure. Mild mitral valve  regurgitation. No evidence of mitral stenosis.   6. Tricuspid valve regurgitation is mild to moderate.   7. The aortic valve is tricuspid. Aortic valve regurgitation is not  visualized. No aortic stenosis is present.   8. The inferior vena cava is dilated in size with >50% respiratory  variability, suggesting right atrial pressure of 8 mmHg.   Recent  Labs: 08/09/2022: ALT 21; TSH 2.41 08/17/2022: B Natriuretic Peptide 382.1 08/20/2022: Magnesium 2.1 09/07/2022: BUN 20; Creatinine, Ser 0.87; Hemoglobin 14.4; Platelets 339; Potassium 4.9; Sodium 142    Recent Lipid Panel    Component Value Date/Time   CHOL 145 06/07/2022 0757   TRIG 106 06/07/2022 0757   HDL 62 06/07/2022 0757   CHOLHDL 2.3 06/07/2022 0757   VLDL 23 03/15/2016 0842   LDLCALC 64 06/07/2022 0757     Risk Assessment/Calculations:    CHA2DS2-VASc Score = 3   This indicates a 3.2% annual risk of stroke. The patient's score is based upon: CHF History: 0 HTN History: 0 Diabetes History: 0 Stroke History: 0 Vascular Disease History: 1 Age Score: 1 Gender Score: 1      Physical Exam:    VS:   Vitals:   09/16/22 0956  BP: 110/72  Pulse: 95  SpO2: 94%    BP 110/72 (BP Location: Right Arm, Patient Position: Sitting, Cuff Size: Normal)   Pulse 95   Ht 5\' 2"  (1.575 m)   Wt 128 lb 12.8 oz (58.4 kg)   LMP  (LMP Unknown)   SpO2 94%   BMI 23.56 kg/m     Wt Readings from Last 3 Encounters:  09/16/22 128 lb 12.8 oz (58.4 kg)  09/09/22 125 lb (56.7 kg)  08/27/22 127 lb (57.6 kg)     GEN:  Well nourished, well developed in no acute distress HEENT: Normal NECK: No JVD CARDIAC: IRRR, no murmurs, rubs, gallops RESPIRATORY:  Clear to auscultation without rales, wheezing or rhonchi  ABDOMEN: Soft, non-tender, non-distended MUSCULOSKELETAL:  No edema; No deformity  SKIN: Warm and dry NEUROLOGIC:  Alert and oriented x 3 PSYCHIATRIC:  Normal affect   ASSESSMENT:    Persistent Atrial Fibrillation. TSH is normal. Normal EF. No structural dx. - s/p DCCV  with restoration to NSR; but back in afib today - discussed  options including amiodarone, sotalol, dofetilide. She opted for sotalol or dofetilide load. Will plan for afib clinic to set up - continue dilt 60 BID and eliquis - change metop tartrate 25 mg BID-> metop Xl 50 mg daily - Discussed getting a pulse  ox, if rates sustained >130s and she is symptomatic, she can let us know - see below  Thoracic aortic aneurysm/prox descending - 5.6 cm. hypoattenuating noncalcified thrombus. Chronic Type B aortic dissection. Evaluated by Dr. Edilia Bo,   PLAN:    In order of problems listed above:  Change metop tartrate 25 mg BID-> metop XL 50 mg daily Follow up with afib clinic to set up on dofetilide/sotalol/ DCCV; consider PVI if not effective Follow up in 6 months      Medication Adjustments/Labs and Tests Ordered: Current medicines are reviewed at length with the patient today.  Concerns regarding medicines are outlined above.  Orders Placed This Encounter  Procedures   Amb Referral to AFIB Clinic   EKG 12-Lead   Meds ordered this encounter  Medications   metoprolol succinate (TOPROL-XL) 50 MG 24 hr tablet    Sig: Take 1 tablet (50 mg total) by mouth daily. Take with or immediately following a meal.    Dispense:  90 tablet    Refill:  3    Patient Instructions  Medication Instructions:   STOP METOPROLOL TARTRATE   START: METOPROLOL SUCCINATE 50mg  ONCE DAILY   *If you need a refill on your cardiac medications before your next appointment, please call your pharmacy*  Follow-Up: At [facility], you and your health needs are our priority.  As part of our continuing mission to provide you with exceptional heart care, we have created designated Provider Care Teams.  These Care Teams include your primary Cardiologist (physician) and Advanced Practice Providers (APPs -  Physician Assistants and Nurse Practitioners) who all work together to provide you with the care you need, when you need it.  PLEASE SCHEDULE APPOINTMENT WITH ATRIAL FIB CLINIC  Your next appointment:   6 month(s)  Provider:   [name], MD      Signed, [name], MD  09/16/2022 10:34 AM    [facility]

## 2022-09-16 ENCOUNTER — Encounter: Payer: Self-pay | Admitting: Internal Medicine

## 2022-09-16 ENCOUNTER — Ambulatory Visit: Payer: Medicare Other | Attending: Internal Medicine | Admitting: Internal Medicine

## 2022-09-16 VITALS — BP 110/72 | HR 95 | Ht 62.0 in | Wt 128.8 lb

## 2022-09-16 DIAGNOSIS — I4891 Unspecified atrial fibrillation: Secondary | ICD-10-CM | POA: Insufficient documentation

## 2022-09-16 MED ORDER — METOPROLOL SUCCINATE ER 50 MG PO TB24: 50.0000 mg | ORAL_TABLET | Freq: Every day | ORAL | 3 refills | Status: DC

## 2022-09-16 NOTE — Patient Instructions (Addendum)
Medication Instructions:   STOP METOPROLOL TARTRATE   START: METOPROLOL SUCCINATE 50mg  ONCE DAILY   *If you need a refill on your cardiac medications before your next appointment, please call your pharmacy*  Follow-Up: At [facility], you and your health needs are our priority.  As part of our continuing mission to provide you with exceptional heart care, we have created designated Provider Care Teams.  These Care Teams include your primary Cardiologist (physician) and Advanced Practice Providers (APPs -  Physician Assistants and Nurse Practitioners) who all work together to provide you with the care you need, when you need it.  PLEASE SCHEDULE APPOINTMENT WITH ATRIAL FIB CLINIC  Your next appointment:   6 month(s)  Provider:   [name], MD

## 2022-09-20 ENCOUNTER — Encounter (HOSPITAL_COMMUNITY): Payer: Self-pay | Admitting: Internal Medicine

## 2022-09-20 ENCOUNTER — Ambulatory Visit (HOSPITAL_COMMUNITY)
Admission: RE | Admit: 2022-09-20 | Discharge: 2022-09-20 | Disposition: A | Discharge status: Home / Self Care | Payer: Medicare Other | Source: Ambulatory Visit | Attending: Internal Medicine | Admitting: Internal Medicine

## 2022-09-20 VITALS — BP 118/62 | HR 97 | Ht 62.0 in | Wt 130.0 lb

## 2022-09-20 DIAGNOSIS — I4819 Other persistent atrial fibrillation: Secondary | ICD-10-CM | POA: Diagnosis not present

## 2022-09-20 DIAGNOSIS — D6869 Other thrombophilia: Secondary | ICD-10-CM

## 2022-09-20 DIAGNOSIS — I7100 Dissection of unspecified site of aorta: Secondary | ICD-10-CM | POA: Diagnosis not present

## 2022-09-20 DIAGNOSIS — E785 Hyperlipidemia, unspecified: Secondary | ICD-10-CM | POA: Diagnosis not present

## 2022-09-20 DIAGNOSIS — I714 Abdominal aortic aneurysm, without rupture, unspecified: Secondary | ICD-10-CM | POA: Insufficient documentation

## 2022-09-20 DIAGNOSIS — K219 Gastro-esophageal reflux disease without esophagitis: Secondary | ICD-10-CM | POA: Insufficient documentation

## 2022-09-20 DIAGNOSIS — D6859 Other primary thrombophilia: Secondary | ICD-10-CM | POA: Insufficient documentation

## 2022-09-20 MED ORDER — AMIODARONE HCL 200 MG PO TABS: ORAL_TABLET | ORAL | 0 refills | Status: DC

## 2022-09-20 NOTE — Progress Notes (Signed)
Primary Care Physician: Donita Brooks, MD Primary Cardiologist: Dr. Wyline Mood Primary Electrophysiologist: None Referring Physician:    [name] is a 71 y.o. female with a history of HLD, GERD, descending thoracic aortic aneurysm versus chronic type B aortic dissection, significant laminated thrombus within aneurysm, and persistent atrial fibrillation who presents for consultation in the [facility]. The patient was initially diagnosed with atrial fibrillation on 3/25 by PCP and was started on Eliquis 5 mg BID. Patient admits to not starting anticoagulation at that time yet. She saw PCP on f/u 3/28 and Toprol was increased to 100 mg daily. She presented to the ED at [facility] on 4/2 with chest pain and shortness of breath. Review of records show she took two full doses of Eliquis on 4/3. She did not have surgery for vascular finding, believed to be chronic, and has follow up with them outpatient for repeat CT. She was discharged on diltiazem 60 mg BID and Lopressor 25 mg BID. Patient is on Eliquis 5 mg BID for a CHADS2VASC score of 3.  On evaluation 08/25/22, she is currently in Afib. She states she does not really feel it at this time. She states it is not like when she had chest pain and shortness of breath on day of ED visit. She is transitioning to Dr. Wyline Mood for new cardiologist. She feels tired when she does activity but otherwise in office feels okay. She does not drink alcohol. She does not snore or stop breathing per husband.   Mother and aunt with history of Afib.  On follow up 09/20/22, she is currently in Afib. She is s/p successful DCCV on 4/25 with conversion to NSR x 2 shocks. Saw Dr. Wyline Mood on 5/2 and was back in Afib. Patient states she was in normal rhythm for about a day and a half. After discussion with Dr. Wyline Mood, patient was considering Tikosyn load. Metoprolol changed to Toprol 50 mg daily. Since office visit, she has felt mild chest pressure  over the weekend but has resolved. She doesn't really feel tired while in Afib. She has questions about Tikosyn and also has questions about ablation procedure.  She is compliant with anticoagulation and has not missed any doses. She has no bleeding concerns.  Today, she denies symptoms of palpitations, orthopnea, PND, lower extremity edema, dizziness, presyncope, syncope, snoring, daytime somnolence, bleeding, or neurologic sequela. The patient is tolerating medications without difficulties and is otherwise without complaint today.   Atrial Fibrillation Risk Factors:  she does not have symptoms or diagnosis of sleep apnea. she does not have a history of rheumatic fever. she does not have a history of alcohol use. The patient does have a history of early familial atrial fibrillation or other arrhythmias.  she has a BMI of Body mass index is 23.78 kg/m.Marland Kitchen Filed Weights   09/20/22 0929  Weight: 59 kg     Family History  Problem Relation Age of Onset   Arthritis Mother    Heart disease Mother    Vision loss Mother    Stroke Mother    Depression Sister    Transient ischemic attack Sister 38   Depression Sister    Cancer Maternal Grandmother    Cancer Maternal Grandfather    Early death Maternal Grandfather    Heart disease Maternal Grandfather    Early death Paternal Grandfather    Heart disease Paternal Grandfather    Diabetes Other        Juvenile  Colon cancer Neg Hx    Rectal cancer Neg Hx    Prostate cancer Neg Hx      Atrial Fibrillation Management history:  Previous antiarrhythmic drugs: None Previous cardioversions: 09/09/22 Previous ablations: None Anticoagulation history: Eliquis 5 mg BID    Past Medical History:  Diagnosis Date   Adenomatous colon polyp    Allergy    Anemia    as a child, no current problem   Atrial fibrillation (HCC)    Blood transfusion without reported diagnosis    39 months old   Cataract    bilateral - MD just watching    GERD  (gastroesophageal reflux disease)    Hyperlipidemia    Osteoporosis 04/15/2016   Skin cancer 2013   Skin cancer- Nose, face   Past Surgical History:  Procedure Laterality Date   CARDIOVERSION N/A 09/09/2022   Procedure: CARDIOVERSION;  Surgeon: [name], MD;  [facility] INVASIVE CV LAB;  Service: Cardiovascular;  Laterality: N/A;   CESAREAN SECTION  1981   x 1   COLONOSCOPY  2016   hx polyp, tics, sigmoid colon mod tortuous Dr Dory Peru,    EYE SURGERY Bilateral    retina repair x 2   LAPAROTOMY  1979   removal of ovarian cyst   nissan fundiplication  05/17/1997   REPAIR OF COMPLEX TRACTION RETINAL DETACHMENT Bilateral 01/2019    Current Outpatient Medications  Medication Sig Dispense Refill   acetaminophen (TYLENOL) 500 MG tablet Take 1,000 mg by mouth every 6 (six) hours as needed for moderate pain.     alendronate (FOSAMAX) 70 MG tablet TAKE 1 TABLET BY MOUTH ONCE A WEEK ON AN EMPTY STOMACH WITH A FULL GLASS OF WATER 12 tablet 3   amiodarone (PACERONE) 200 MG tablet Take 1 tablet by mouth twice a day for 14 days then reduce to once a day 120 tablet 0   apixaban (ELIQUIS) 5 MG TABS tablet Take 1 tablet (5 mg total) by mouth 2 (two) times daily. 180 tablet 1   Ascorbic Acid (VITAMIN C) 1000 MG tablet Take 1,000 mg by mouth daily.     Biotin 1000 MCG tablet Take 1,000 mcg by mouth daily.     Calcium Carb-Cholecalciferol (CALCIUM 600 + D PO) Take 1 tablet by mouth daily.     Calcium Polycarbophil (FIBER-CAPS PO) Take 1 capsule by mouth daily.     cetirizine (ZYRTEC) 10 MG tablet Take 10 mg by mouth daily.     Cholecalciferol (VITAMIN D3) 250 MCG (10000 UT) capsule Take 10,000 Units by mouth daily.     Cranberry 500 MG CHEW Chew 500 mg by mouth daily.     diltiazem (CARDIZEM SR) 60 MG 12 hr capsule Take 1 capsule (60 mg total) by mouth every 12 (twelve) hours. 180 capsule 1   docusate sodium (COLACE) 100 MG capsule Take 100 mg by mouth daily.     famotidine (PEPCID) 20 MG tablet  Take 20 mg by mouth daily as needed for heartburn or indigestion.     furosemide (LASIX) 40 MG tablet Take 1 tablet (40 mg total) by mouth daily. (Patient taking differently: Take 40 mg by mouth daily as needed for edema.) 30 tablet 3   Lidocaine (SALONPAS PAIN RELIEVING EX) Apply 1 Application topically daily as needed (pain).     Menthol-Methyl Salicylate (SALONPAS PAIN RELIEF PATCH EX) Apply 1 patch topically daily as needed (pain).     metoprolol succinate (TOPROL-XL) 50 MG 24 hr tablet Take 1 tablet (  50 mg total) by mouth daily. Take with or immediately following a meal. 90 tablet 3   Multiple Vitamin (MULTIVITAMIN) tablet Take 1 tablet by mouth daily.     omeprazole (PRILOSEC OTC) 20 MG tablet Take 20 mg by mouth daily as needed (acid reflux).     Probiotic Product (PROBIOTIC PO) Take 1 tablet by mouth daily.     No current facility-administered medications for this encounter.    No Known Allergies  Social History   Socioeconomic History   Marital status: Married    Spouse name: Not on file   Number of children: Not on file   Years of education: Not on file   Highest education level: 12th grade  Occupational History   Not on file  Tobacco Use   Smoking status: Never   Smokeless tobacco: Never  Vaping Use   Vaping Use: Never used  Substance and Sexual Activity   Alcohol use: Yes    Alcohol/week: 1.0 standard drink of alcohol    Types: 1 Glasses of wine per week    Comment: rarely   Drug use: No   Sexual activity: Not Currently    Birth control/protection: Post-menopausal  Other Topics Concern   Not on file  Social History Narrative   Not on file   Social Determinants of Health   Financial Resource Strain: Low Risk  (08/09/2022)   Overall Financial Resource Strain (CARDIA)    Difficulty of Paying Living Expenses: Not hard at all  Food Insecurity: No Food Insecurity (08/23/2022)   Hunger Vital Sign    Worried About Running Out of Food in the Last Year: Never true     Ran Out of Food in the Last Year: Never true  Transportation Needs: No Transportation Needs (08/09/2022)   PRAPARE - Administrator, Civil Service (Medical): No    Lack of Transportation (Non-Medical): No  Physical Activity: Unknown (08/09/2022)   Exercise Vital Sign    Days of Exercise per Week: 3 days    Minutes of Exercise per Session: Not on file  Stress: No Stress Concern Present (08/09/2022)   [employer] of Occupational Health - Occupational Stress Questionnaire    Feeling of Stress : Only a little  Social Connections: Moderately Integrated (08/09/2022)   Social Connection and Isolation Panel [NHANES]    Frequency of Communication with Friends and Family: Three times a week    Frequency of Social Gatherings with Friends and Family: Twice a week    Attends Religious Services: 1 to 4 times per year    Active Member of [employer] or Organizations: No    Attends Engineer, structural: Not on file    Marital Status: Married  Catering manager Violence: Not on file    ROS- All systems are reviewed and negative except as per the HPI above.  Physical Exam: Vitals:   09/20/22 0929  BP: 118/62  Pulse: 97  Weight: 59 kg  Height: 5\' 2"  (1.575 m)    GEN- The patient is well appearing, alert and oriented x 3 today.   Head- normocephalic, atraumatic Eyes-  Sclera clear, conjunctiva pink Ears- hearing intact Oropharynx- clear Neck- supple, no JVP Lymph- no cervical lymphadenopathy Lungs- Clear to ausculation bilaterally, normal work of breathing Heart- Irregular rate and rhythm, no murmurs, rubs or gallops, PMI not laterally displaced GI- soft, NT, ND, + BS Extremities- no clubbing, cyanosis, or edema MS- no significant deformity or atrophy Skin- no rash or lesion Psych- euthymic mood, full  affect Neuro- strength and sensation are intact   Wt Readings from Last 3 Encounters:  09/20/22 59 kg  09/16/22 58.4 kg  09/09/22 56.7 kg    EKG today demonstrates   Vent. rate 97 BPM PR interval * ms QRS duration 84 ms QT/QTcB 368/467 ms P-R-T axes * 75 67 Atrial fibrillation Septal infarct , age undetermined Abnormal ECG When compared with ECG of 09-Sep-2022 10:43, PREVIOUS ECG IS PRESENT  Echo 08/18/22 demonstrated:  1. Left ventricular ejection fraction, by estimation, is 60 to 65%. The  left ventricle has normal function. The left ventricle has no regional  wall motion abnormalities. Left ventricular diastolic function could not  be evaluated.   2. Right ventricular systolic function is normal. The right ventricular  size is normal. There is normal pulmonary artery systolic pressure. The  estimated right ventricular systolic pressure is 26.5 mmHg.   3. Left atrial size was mild to moderately dilated.   4. Right atrial size was mildly dilated.   5. The mitral valve is normal in structure. Mild mitral valve  regurgitation. No evidence of mitral stenosis.   6. Tricuspid valve regurgitation is mild to moderate.   7. The aortic valve is tricuspid. Aortic valve regurgitation is not  visualized. No aortic stenosis is present.   8. The inferior vena cava is dilated in size with >50% respiratory  variability, suggesting right atrial pressure of 8 mmHg.   Epic records are reviewed at length today.  CHA2DS2-VASc Score = 3  The patient's score is based upon: CHF History: 0 HTN History: 0 Diabetes History: 0 Stroke History: 0 Vascular Disease History: 1 Age Score: 1 Gender Score: 1       ASSESSMENT AND PLAN: Persistent Atrial Fibrillation (ICD10:  I48.19) The patient's CHA2DS2-VASc score is 3, indicating a 3.2% annual risk of stroke.   S/p successful DCCV on 09/09/22.  She is in Afib today.  Discussion with Dr. Wyline Mood on 5/2 regarding options for treatment of Afib. She had thought more about taking the Tikosyn medication and has questions regarding possible ablation procedure. We discussed Tikosyn load requiring 3 night admission and also  discussed ablation procedure. After discussion, patient is interested in speaking with EP regarding ablation. She states she does not want to take medication indefinitely if can be helped. In order to help get her in normal rhythm now, will place patient on amiodarone as a bridge.  - Begin amiodarone 200 mg BID x 14 days, then daily. She had TSH and CMP 08/09/22 both normal.  - Schedule DCCV in ~3 weeks to allow for amio load. - Schedule with EP to discuss ablation. - Repeat ECG in 2 weeks.    2. Secondary Hypercoagulable State (ICD10:  D68.69) The patient is at significant risk for stroke/thromboembolism based upon her CHA2DS2-VASc Score of 3.  Continue Apixaban (Eliquis).   Education on compliance provided. No missed doses.    Repeat ECG in 2 weeks.    Lake Bells, PA-C Afib Clinic [facility] [address] [place], [place] [address] [phone] 09/20/2022 11:00 AM

## 2022-09-20 NOTE — Patient Instructions (Signed)
Start Amiodarone 200mg  twice a day for 14 days then reduce to once a day take with food -- call Donne Baley when you are starting amiodarone so we can setup 2 week follow up from that day. [phone]

## 2022-09-24 ENCOUNTER — Ambulatory Visit: Payer: Medicare Other | Attending: Cardiovascular Disease | Admitting: Cardiovascular Disease

## 2022-09-24 ENCOUNTER — Encounter: Payer: Self-pay | Admitting: Cardiovascular Disease

## 2022-09-24 VITALS — BP 102/64 | HR 105 | Ht 62.0 in | Wt 129.0 lb

## 2022-09-24 DIAGNOSIS — D6869 Other thrombophilia: Secondary | ICD-10-CM | POA: Insufficient documentation

## 2022-09-24 DIAGNOSIS — I4819 Other persistent atrial fibrillation: Secondary | ICD-10-CM | POA: Insufficient documentation

## 2022-09-24 NOTE — Progress Notes (Signed)
Electrophysiology Office Note:    Date:  09/24/2022   ID:  Terri Alexander, DOB 1950-06-10, MRN [id number]  PCP:  Donita Brooks, MD   [facility] Providers Cardiologist:  [name], MD     Referring MD: Eustace Pen, PA-C   History of Present Illness:    Terri Alexander is a 72 y.o. female with a hx listed below, significant for descending thoracic aneurysm, referred for arrhythmia management.  She was initially diagnosed with atrial fibrillation in March 25 her PCP.  Eliquis was prescribed.  Toprol was increased for rate control.  She was seen in cardiology clinic in atrial fibrillation clinic.  She remained persistently in atrial fibrillation through April during those visits.  Rhythm control options were discussed, and the patient expressed interest in ablation over Tikosyn load.  Amiodarone was started with plans for DC cardioversion.  She reports today that she is feeling fairly well.  She noticed palpitations in the 1990s --she was experiencing them rarely prior to moving to [place] from [place].  They have been increasing over the past 2 or 3 years.  Her palpitations are mild now.  She does have some dyspnea and shortness of breath as well.  Past Medical History:  Diagnosis Date   Adenomatous colon polyp    Allergy    Anemia    as a child, no current problem   Atrial fibrillation (HCC)    Blood transfusion without reported diagnosis    77 months old   Cataract    bilateral - MD just watching    GERD (gastroesophageal reflux disease)    Hyperlipidemia    Osteoporosis 04/15/2016   Skin cancer 2013   Skin cancer- Nose, face    Past Surgical History:  Procedure Laterality Date   CARDIOVERSION N/A 09/09/2022   Procedure: CARDIOVERSION;  Surgeon: [name], MD;  Location: [facility]VAS[facility] CV LAB;  Service: Cardiovascular;  Laterality: N/A;   CESAREAN SECTION  1981   x 1   COLONOSCOPY  2016   hx polyp, tics, sigmoid colon mod tortuous Dr  Dory Peru,    EYE SURGERY Bilateral    retina repair x 2   LAPAROTOMY  1979   removal of ovarian cyst   nissan fundiplication  05/17/1997   REPAIR OF COMPLEX TRACTION RETINAL DETACHMENT Bilateral 01/2019    Current Medications: Current Meds  Medication Sig   acetaminophen (TYLENOL) 500 MG tablet Take 1,000 mg by mouth every 6 (six) hours as needed for moderate pain.   alendronate (FOSAMAX) 70 MG tablet TAKE 1 TABLET BY MOUTH ONCE A WEEK ON AN EMPTY STOMACH WITH A FULL GLASS OF WATER   amiodarone (PACERONE) 200 MG tablet Take 1 tablet by mouth twice a day for 14 days then reduce to once a day   apixaban (ELIQUIS) 5 MG TABS tablet Take 1 tablet (5 mg total) by mouth 2 (two) times daily.   Ascorbic Acid (VITAMIN C) 1000 MG tablet Take 1,000 mg by mouth daily.   Biotin 1000 MCG tablet Take 1,000 mcg by mouth daily.   Calcium Carb-Cholecalciferol (CALCIUM 600 + D PO) Take 1 tablet by mouth daily.   Calcium Polycarbophil (FIBER-CAPS PO) Take 1 capsule by mouth daily.   cetirizine (ZYRTEC) 10 MG tablet Take 10 mg by mouth daily.   Cholecalciferol (VITAMIN D3) 250 MCG (10000 UT) capsule Take 10,000 Units by mouth daily.   Cranberry 500 MG CHEW Chew 500 mg by mouth daily.   diltiazem (  CARDIZEM SR) 60 MG 12 hr capsule Take 1 capsule (60 mg total) by mouth every 12 (twelve) hours.   docusate sodium (COLACE) 100 MG capsule Take 100 mg by mouth daily.   famotidine (PEPCID) 20 MG tablet Take 20 mg by mouth daily as needed for heartburn or indigestion.   furosemide (LASIX) 40 MG tablet Take 1 tablet (40 mg total) by mouth daily. (Patient taking differently: Take 40 mg by mouth daily as needed for edema.)   Lidocaine (SALONPAS PAIN RELIEVING EX) Apply 1 Application topically daily as needed (pain).   Menthol-Methyl Salicylate (SALONPAS PAIN RELIEF PATCH EX) Apply 1 patch topically daily as needed (pain).   metoprolol succinate (TOPROL-XL) 50 MG 24 hr tablet Take 1 tablet (50 mg total) by mouth daily. Take  with or immediately following a meal.   Multiple Vitamin (MULTIVITAMIN) tablet Take 1 tablet by mouth daily.   omeprazole (PRILOSEC OTC) 20 MG tablet Take 20 mg by mouth daily as needed (acid reflux).   Probiotic Product (PROBIOTIC PO) Take 1 tablet by mouth daily.     Allergies:   Patient has no known allergies.   Social and Family History: Reviewed in Epic  ROS:   Please see the history of present illness.    All other systems reviewed and are negative.  EKGs/Labs/Other Studies Reviewed Today:    Echocardiogram:  TTE 08/18/22 Ejection fraction 60 to 65%.  Left atrium valve structure.   Monitors:   Stress testing:   Advanced imaging:   Cardiac catherization    EKG:  Last EKG results: today - atrial fibrillation   Recent Labs: 08/09/2022: ALT 21; TSH 2.41 08/17/2022: B Natriuretic Peptide 382.1 08/20/2022: Magnesium 2.1 09/07/2022: BUN 20; Creatinine, Ser 0.87; Hemoglobin 14.4; Platelets 339; Potassium 4.9; Sodium 142     Physical Exam:    VS:  BP 102/64   Pulse (!) 105   Ht 5\' 2"  (1.575 m)   Wt 129 lb (58.5 kg)   LMP  (LMP Unknown)   SpO2 98%   BMI 23.59 kg/m     Wt Readings from Last 3 Encounters:  09/24/22 129 lb (58.5 kg)  09/20/22 130 lb (59 kg)  09/16/22 128 lb 12.8 oz (58.4 kg)     GEN:  Well nourished, well developed in no acute distress CARDIAC: iRRR, no murmurs, rubs, gallops RESPIRATORY:  Normal work of breathing MUSCULOSKELETAL: no edema    ASSESSMENT & PLAN:    Persistent atrial fibrillation I suspect she has had paroxysmal atrial fibrillation dating back to the early 90s She is symptomatic with palpitations, fatigue, shortness of breath Rhythm control is indicated.  We discussed options including Tikosyn versus ablation.  She would prefer ablation to long-term use of medication She is going to start amiodarone today, and scheduled for DC cardioversion in the near future We are scheduling her for atrial fibrillation ablation  We  discussed the indication, rationale, logistics, anticipated benefits, and potential risks of the ablation procedure including but not limited to -- bleed at the groin access site, chest pain, damage to nearby organs such as the diaphragm, lungs, or esophagus, need for a drainage tube, or prolonged hospitalization. I explained that the risk for stroke, heart attack, need for open chest surgery, or even death is very low but not zero. she  expressed understanding and wishes to proceed.   Secondary hypercoagulable state Continue apixaban 5 mg BID  Descending thoracic aortic aneurysm She has an appoint with vascular surgery in October  Medication Adjustments/Labs and Tests Ordered: Current medicines are reviewed at length with the patient today.  Concerns regarding medicines are outlined above.  No orders of the defined types were placed in this encounter.  No orders of the defined types were placed in this encounter.    Signed, Maurice Small, MD  09/24/2022 11:10 AM    [facility]

## 2022-09-24 NOTE — Patient Instructions (Signed)
Medication Instructions:  Your physician recommends that you continue on your current medications as directed. Please refer to the Current Medication list given to you today. *If you need a refill on your cardiac medications before your next appointment, please call your pharmacy*   Testing/Procedures: Atrial Fibrillation Ablation  Your physician has recommended that you have an ablation. Catheter ablation is a medical procedure used to treat some cardiac arrhythmias (irregular heartbeats). During catheter ablation, a long, thin, flexible tube is put into a blood vessel in your groin (upper thigh), or neck. This tube is called an ablation catheter. It is then guided to your heart through the blood vessel. Radio frequency waves destroy small areas of heart tissue where abnormal heartbeats may cause an arrhythmia to start. Please see the instruction sheet given to you today.   You are scheduled for Atrial Fibrillation Ablation on Wednesday, November 6 with Dr. Halford Chessman.Please arrive at the Main Entrance A at [facility]: [address] [place], [place] [address] at 6:30 AM     Follow-Up: At [facility], you and your health needs are our priority.  As part of our continuing mission to provide you with exceptional heart care, we have created designated Provider Care Teams.  These Care Teams include your primary Cardiologist (physician) and Advanced Practice Providers (APPs -  Physician Assistants and Nurse Practitioners) who all work together to provide you with the care you need, when you need it.  We recommend signing up for the patient portal called "MyChart".  Sign up information is provided on this After Visit Summary.  MyChart is used to connect with patients for Virtual Visits (Telemedicine).  Patients are able to view lab/test results, encounter notes, upcoming appointments, etc.  Non-urgent messages can be sent to your provider as well.   To learn more about what you can do  with MyChart, go to [email or web address].    Your next appointment:   Friday, March 04, 2023  Provider:   York Pellant, MD

## 2022-10-08 ENCOUNTER — Ambulatory Visit (HOSPITAL_COMMUNITY): Payer: Medicare Other | Admitting: Internal Medicine

## 2022-10-14 ENCOUNTER — Ambulatory Visit (HOSPITAL_COMMUNITY)
Admission: RE | Admit: 2022-10-14 | Discharge: 2022-10-14 | Disposition: A | Discharge status: Home / Self Care | Payer: Medicare Other | Source: Ambulatory Visit | Attending: Internal Medicine | Admitting: Internal Medicine

## 2022-10-14 VITALS — HR 47

## 2022-10-14 DIAGNOSIS — I4819 Other persistent atrial fibrillation: Secondary | ICD-10-CM | POA: Insufficient documentation

## 2022-10-14 DIAGNOSIS — R001 Bradycardia, unspecified: Secondary | ICD-10-CM | POA: Diagnosis not present

## 2022-10-14 DIAGNOSIS — Z79899 Other long term (current) drug therapy: Secondary | ICD-10-CM | POA: Insufficient documentation

## 2022-10-14 DIAGNOSIS — I440 Atrioventricular block, first degree: Secondary | ICD-10-CM | POA: Insufficient documentation

## 2022-10-14 MED ORDER — METOPROLOL SUCCINATE ER 25 MG PO TB24: 25.0000 mg | ORAL_TABLET | Freq: Every day | ORAL | 1 refills | Status: DC

## 2022-10-14 MED ORDER — AMIODARONE HCL 200 MG PO TABS: 200.0000 mg | ORAL_TABLET | Freq: Every day | ORAL | 1 refills | Status: DC

## 2022-10-14 NOTE — Progress Notes (Signed)
Patient doing well with no concerns. Noted sinus bradycardia on today's ECG. Currently taking amiodarone 200 mg daily.  ECG today shows  Vent. rate 47 BPM PR interval 282 ms QRS duration 92 ms QT/QTcB 502/444 ms P-R-T axes 71 70 73 Sinus bradycardia with 1st degree A-V block Septal infarct , age undetermined Abnormal ECG When compared with ECG of 20-Sep-2022 09:46, PREVIOUS ECG IS PRESENT  Decrease Toprol 50 mg to half tablet (25 mg) daily.  Repeat ECG in 3 months.

## 2022-10-14 NOTE — Patient Instructions (Signed)
Decrease amiodarone to 200mg   ONCE a day   Decrease metoprolol to 25mg  once a day

## 2022-11-16 ENCOUNTER — Telehealth: Payer: Self-pay

## 2022-11-16 NOTE — Telephone Encounter (Signed)
LM for pt to call back. I need to reschedule her AF ablation with [facility].

## 2023-01-12 ENCOUNTER — Ambulatory Visit (HOSPITAL_COMMUNITY)
Admission: RE | Admit: 2023-01-12 | Discharge: 2023-01-12 | Disposition: A | Discharge status: Home / Self Care | Payer: Medicare Other | Source: Ambulatory Visit | Attending: Internal Medicine | Admitting: Internal Medicine

## 2023-01-12 VITALS — HR 44

## 2023-01-12 DIAGNOSIS — R9431 Abnormal electrocardiogram [ECG] [EKG]: Secondary | ICD-10-CM | POA: Insufficient documentation

## 2023-01-12 DIAGNOSIS — I440 Atrioventricular block, first degree: Secondary | ICD-10-CM | POA: Insufficient documentation

## 2023-01-12 DIAGNOSIS — I252 Old myocardial infarction: Secondary | ICD-10-CM | POA: Insufficient documentation

## 2023-01-12 DIAGNOSIS — I4819 Other persistent atrial fibrillation: Secondary | ICD-10-CM | POA: Insufficient documentation

## 2023-01-12 DIAGNOSIS — R001 Bradycardia, unspecified: Secondary | ICD-10-CM | POA: Diagnosis present

## 2023-01-12 NOTE — Progress Notes (Signed)
Patient currently taking amiodarone 200 mg daily.   ECG today shows Vent. rate 44 BPM PR interval 256 ms QRS duration 86 ms QT/QTcB 500/427 ms P-R-T axes -3 3 -3 Marked sinus bradycardia with 1st degree A-V block Anterior infarct , age undetermined Abnormal ECG When compared with ECG of 14-Oct-2022 08:35, PREVIOUS ECG IS PRESENT  After discussion with patient, will lower dose to Toprol 12.5 mg (half tablet) daily. Advised to call clinic Friday with HR recordings. Pt will follow up as scheduled for imaging prior to ablation.

## 2023-01-14 DIAGNOSIS — Z23 Encounter for immunization: Secondary | ICD-10-CM | POA: Diagnosis not present

## 2023-01-21 ENCOUNTER — Other Ambulatory Visit: Payer: Self-pay

## 2023-01-21 DIAGNOSIS — I712 Thoracic aortic aneurysm, without rupture, unspecified: Secondary | ICD-10-CM

## 2023-01-24 ENCOUNTER — Ambulatory Visit: Payer: Medicare Other | Admitting: Cardiovascular Disease

## 2023-02-03 ENCOUNTER — Encounter: Payer: Self-pay | Admitting: Cardiovascular Disease

## 2023-02-03 ENCOUNTER — Ambulatory Visit: Payer: Medicare Other | Attending: Cardiovascular Disease | Admitting: Cardiovascular Disease

## 2023-02-03 ENCOUNTER — Ambulatory Visit: Payer: Medicare Other

## 2023-02-03 VITALS — BP 118/82 | HR 59 | Ht 62.0 in | Wt 134.4 lb

## 2023-02-03 DIAGNOSIS — D6869 Other thrombophilia: Secondary | ICD-10-CM

## 2023-02-03 DIAGNOSIS — I4819 Other persistent atrial fibrillation: Secondary | ICD-10-CM | POA: Diagnosis not present

## 2023-02-03 NOTE — Patient Instructions (Signed)
Medication Instructions:  Your physician recommends that you continue on your current medications as directed. Please refer to the Current Medication list given to you today. *If you need a refill on your cardiac medications before your next appointment, please call your pharmacy*   Lab Work: CBC and BMET today If you have labs (blood work) drawn today and your tests are completely normal, you will receive your results only by: MyChart Message (if you have MyChart) OR A paper copy in the mail If you have any lab test that is abnormal or we need to change your treatment, we will call you to review the results.   Testing/Procedures: Atrial Fibrillation Ablation  Your physician has recommended that you have an ablation. Catheter ablation is a medical procedure used to treat some cardiac arrhythmias (irregular heartbeats). During catheter ablation, a long, thin, flexible tube is put into a blood vessel in your groin (upper thigh), or neck. This tube is called an ablation catheter. It is then guided to your heart through the blood vessel. Radio frequency waves destroy small areas of heart tissue where abnormal heartbeats may cause an arrhythmia to start. Please see the instruction sheet given to you today.   Follow-Up: At [facility], you and your health needs are our priority.  As part of our continuing mission to provide you with exceptional heart care, we have created designated Provider Care Teams.  These Care Teams include your primary Cardiologist (physician) and Advanced Practice Providers (APPs -  Physician Assistants and Nurse Practitioners) who all work together to provide you with the care you need, when you need it.  We recommend signing up for the patient portal called "MyChart".  Sign up information is provided on this After Visit Summary.  MyChart is used to connect with patients for Virtual Visits (Telemedicine).  Patients are able to view lab/test results, encounter notes,  upcoming appointments, etc.  Non-urgent messages can be sent to your provider as well.   To learn more about what you can do with MyChart, go to [email or web address].    Your next appointment:   We will schedule follow up after your ablation   Provider:   York Pellant, MD

## 2023-02-03 NOTE — H&P (View-Only) (Signed)
Electrophysiology Office Note:    Date:  02/03/2023   ID:  MAHOGANI KLEIBER, DOB 08/09/1950, MRN [id number]  PCP:  Donita Brooks, MD   [facility] Providers Cardiologist:  [name], MD Electrophysiologist:  Maurice Small, MD     Referring MD: Donita Brooks, MD   History of Present Illness:    Terri Alexander is a 72 y.o. female with a hx listed below, significant for descending thoracic aneurysm, referred for arrhythmia management.  She was initially diagnosed with atrial fibrillation on March 25 her PCP.  Eliquis was prescribed.  Toprol was increased for rate control.  She was seen in cardiology clinic in atrial fibrillation clinic.  She remained persistently in atrial fibrillation through April during those visits.  Rhythm control options were discussed, and the patient expressed interest in ablation over Tikosyn load.  Amiodarone was started with plans for DC cardioversion.  She reports today that she is feeling fairly well.  She noticed palpitations in the 1990s --she was experiencing them rarely prior to moving to [place] from [place].  They have been increasing over the past 2 or 3 years.  Her palpitations are mild now.  She does have some dyspnea and shortness of breath as well.   She presents today to follow-up prior to planned atrial fibrillation on October 2.  She reports there have not been any significant changes in her health.  She is doing well on amiodarone and has not had any symptoms to suggest recurrence of atrial fibrillation.  Her heart rate was quite slow in A-fib clinic, so her beta-blocker dose was decreased.  EKGs/Labs/Other Studies Reviewed Today:    Echocardiogram:  TTE 08/18/22 Ejection fraction 60 to 65%.  Left atrium valve structure.   Monitors:   Stress testing:   Advanced imaging:   Cardiac catherization    EKG:  Last EKG results: today - atrial fibrillation   Recent Labs: 08/09/2022: ALT 21; TSH  2.41 08/17/2022: B Natriuretic Peptide 382.1 08/20/2022: Magnesium 2.1 09/07/2022: BUN 20; Creatinine, Ser 0.87; Hemoglobin 14.4; Platelets 339; Potassium 4.9; Sodium 142     Physical Exam:    VS:  BP 118/82   Pulse (!) 59   Ht 5\' 2"  (1.575 m)   Wt 134 lb 6.4 oz (61 kg)   LMP  (LMP Unknown)   SpO2 99%   BMI 24.58 kg/m     Wt Readings from Last 3 Encounters:  02/03/23 134 lb 6.4 oz (61 kg)  09/24/22 129 lb (58.5 kg)  09/20/22 130 lb (59 kg)     GEN:  Well nourished, well developed in no acute distress CARDIAC: iRRR, no murmurs, rubs, gallops RESPIRATORY:  Normal work of breathing MUSCULOSKELETAL: no edema    ASSESSMENT & PLAN:    Persistent atrial fibrillation I suspect she has had paroxysmal atrial fibrillation dating back to the early 90s She is symptomatic with palpitations, fatigue, shortness of breath Rhythm control is indicated.  We discussed options including Tikosyn versus ablation.  She would prefer ablation to long-term use of medication She is going to start amiodarone today, and scheduled for DC cardioversion in the near future We are scheduling her for atrial fibrillation ablation  We again discussed the indication, rationale, logistics, anticipated benefits, and potential risks of the ablation procedure including but not limited to -- bleed at the groin access site, chest pain, damage to nearby organs such as the diaphragm, lungs, or esophagus, need for a drainage tube, or prolonged  hospitalization. I explained that the risk for stroke, heart attack, need for open chest surgery, or even death is very low but not zero. she  expressed understanding and wishes to proceed.   Secondary hypercoagulable state Continue apixaban 5 mg BID  Descending thoracic aortic aneurysm She has an appoint with vascular surgery in October        Medication Adjustments/Labs and Tests Ordered: Current medicines are reviewed at length with the patient today.  Concerns regarding  medicines are outlined above.  Orders Placed This Encounter  Procedures   EKG 12-Lead   No orders of the defined types were placed in this encounter.    Signed, Maurice Small, MD  02/03/2023 2:28 PM    [facility]

## 2023-02-03 NOTE — Progress Notes (Signed)
Electrophysiology Office Note:    Date:  02/03/2023   ID:  MAHOGANI KLEIBER, DOB 08/09/1950, MRN [id number]  PCP:  Donita Brooks, MD   [facility] Providers Cardiologist:  [name], MD Electrophysiologist:  Maurice Small, MD     Referring MD: Donita Brooks, MD   History of Present Illness:    Terri Alexander is a 72 y.o. female with a hx listed below, significant for descending thoracic aneurysm, referred for arrhythmia management.  She was initially diagnosed with atrial fibrillation on March 25 her PCP.  Eliquis was prescribed.  Toprol was increased for rate control.  She was seen in cardiology clinic in atrial fibrillation clinic.  She remained persistently in atrial fibrillation through April during those visits.  Rhythm control options were discussed, and the patient expressed interest in ablation over Tikosyn load.  Amiodarone was started with plans for DC cardioversion.  She reports today that she is feeling fairly well.  She noticed palpitations in the 1990s --she was experiencing them rarely prior to moving to [place] from [place].  They have been increasing over the past 2 or 3 years.  Her palpitations are mild now.  She does have some dyspnea and shortness of breath as well.   She presents today to follow-up prior to planned atrial fibrillation on October 2.  She reports there have not been any significant changes in her health.  She is doing well on amiodarone and has not had any symptoms to suggest recurrence of atrial fibrillation.  Her heart rate was quite slow in A-fib clinic, so her beta-blocker dose was decreased.  EKGs/Labs/Other Studies Reviewed Today:    Echocardiogram:  TTE 08/18/22 Ejection fraction 60 to 65%.  Left atrium valve structure.   Monitors:   Stress testing:   Advanced imaging:   Cardiac catherization    EKG:  Last EKG results: today - atrial fibrillation   Recent Labs: 08/09/2022: ALT 21; TSH  2.41 08/17/2022: B Natriuretic Peptide 382.1 08/20/2022: Magnesium 2.1 09/07/2022: BUN 20; Creatinine, Ser 0.87; Hemoglobin 14.4; Platelets 339; Potassium 4.9; Sodium 142     Physical Exam:    VS:  BP 118/82   Pulse (!) 59   Ht 5\' 2"  (1.575 m)   Wt 134 lb 6.4 oz (61 kg)   LMP  (LMP Unknown)   SpO2 99%   BMI 24.58 kg/m     Wt Readings from Last 3 Encounters:  02/03/23 134 lb 6.4 oz (61 kg)  09/24/22 129 lb (58.5 kg)  09/20/22 130 lb (59 kg)     GEN:  Well nourished, well developed in no acute distress CARDIAC: iRRR, no murmurs, rubs, gallops RESPIRATORY:  Normal work of breathing MUSCULOSKELETAL: no edema    ASSESSMENT & PLAN:    Persistent atrial fibrillation I suspect she has had paroxysmal atrial fibrillation dating back to the early 90s She is symptomatic with palpitations, fatigue, shortness of breath Rhythm control is indicated.  We discussed options including Tikosyn versus ablation.  She would prefer ablation to long-term use of medication She is going to start amiodarone today, and scheduled for DC cardioversion in the near future We are scheduling her for atrial fibrillation ablation  We again discussed the indication, rationale, logistics, anticipated benefits, and potential risks of the ablation procedure including but not limited to -- bleed at the groin access site, chest pain, damage to nearby organs such as the diaphragm, lungs, or esophagus, need for a drainage tube, or prolonged  hospitalization. I explained that the risk for stroke, heart attack, need for open chest surgery, or even death is very low but not zero. she  expressed understanding and wishes to proceed.   Secondary hypercoagulable state Continue apixaban 5 mg BID  Descending thoracic aortic aneurysm She has an appoint with vascular surgery in October        Medication Adjustments/Labs and Tests Ordered: Current medicines are reviewed at length with the patient today.  Concerns regarding  medicines are outlined above.  Orders Placed This Encounter  Procedures   EKG 12-Lead   No orders of the defined types were placed in this encounter.    Signed, Maurice Small, MD  02/03/2023 2:28 PM    [facility]

## 2023-02-04 LAB — BASIC METABOLIC PANEL
BUN/Creatinine Ratio: 27 (ref 12–28)
BUN: 26 mg/dL (ref 8–27)
CO2: 23 mmol/L (ref 20–29)
Calcium: 9.3 mg/dL (ref 8.7–10.3)
Chloride: 102 mmol/L (ref 96–106)
Creatinine, Ser: 0.98 mg/dL (ref 0.57–1.00)
Glucose: 148 mg/dL — ABNORMAL HIGH (ref 70–99)
Potassium: 4.6 mmol/L (ref 3.5–5.2)
Sodium: 143 mmol/L (ref 134–144)
eGFR: 61 mL/min/{1.73_m2} (ref >59)

## 2023-02-04 LAB — CBC
Hematocrit: 42.8 % (ref 34.0–46.6)
Hemoglobin: 13.5 g/dL (ref 11.1–15.9)
MCH: 30.5 pg (ref 26.6–33.0)
MCHC: 31.5 g/dL (ref 31.5–35.7)
MCV: 97 fL (ref 79–97)
Platelets: 324 10*3/uL (ref 150–450)
RBC: 4.43 x10E6/uL (ref 3.77–5.28)
RDW: 13.6 % (ref 11.7–15.4)
WBC: 8.9 10*3/uL (ref 3.4–10.8)

## 2023-02-08 ENCOUNTER — Telehealth (HOSPITAL_COMMUNITY): Payer: Self-pay | Admitting: *Deleted

## 2023-02-08 NOTE — Telephone Encounter (Signed)
Reaching out to patient to offer assistance regarding upcoming cardiac imaging study; pt verbalizes understanding of appt date/time, parking situation and where to check in, pre-test NPO status, and verified current allergies; name and call back number provided for further questions should they arise  Larey Brick RN Navigator Cardiac Imaging [facility] [phone] office [phone] cell  Patient aware to arrive at 9:30 AM.

## 2023-02-09 ENCOUNTER — Ambulatory Visit (HOSPITAL_COMMUNITY)
Admission: RE | Admit: 2023-02-09 | Discharge: 2023-02-09 | Disposition: A | Discharge status: Home / Self Care | Payer: Medicare Other | Source: Ambulatory Visit | Attending: Cardiovascular Disease | Admitting: Cardiovascular Disease

## 2023-02-09 DIAGNOSIS — D6869 Other thrombophilia: Secondary | ICD-10-CM | POA: Insufficient documentation

## 2023-02-09 DIAGNOSIS — I4819 Other persistent atrial fibrillation: Secondary | ICD-10-CM | POA: Insufficient documentation

## 2023-02-09 MED ORDER — IOHEXOL 350 MG/ML SOLN
95.0000 mL | Freq: Once | INTRAVENOUS | Status: AC | PRN
  Administered 2023-02-09: 95 mL via INTRAVENOUS

## 2023-02-14 ENCOUNTER — Ambulatory Visit
Admission: RE | Admit: 2023-02-14 | Discharge: 2023-02-14 | Disposition: A | Discharge status: Home / Self Care | Payer: Medicare Other | Source: Ambulatory Visit | Attending: Vascular Surgery

## 2023-02-14 DIAGNOSIS — I712 Thoracic aortic aneurysm, without rupture, unspecified: Secondary | ICD-10-CM

## 2023-02-14 MED ORDER — IOPAMIDOL (ISOVUE-370) INJECTION 76%
75.0000 mL | Freq: Once | INTRAVENOUS | Status: AC | PRN
  Administered 2023-02-14: 75 mL via INTRAVENOUS

## 2023-02-15 NOTE — Pre-Procedure Instructions (Signed)
Instructed patient on the following items: Arrival time 0615 Nothing to eat or drink after midnight No meds AM of procedure Responsible person to drive you home and stay with you for 24 hrs  Have you missed any doses of anti-coagulant Eliquis- takes twice a day, hasn't missed any doses.  Don't take dose in the morning.

## 2023-02-16 ENCOUNTER — Ambulatory Visit (HOSPITAL_BASED_OUTPATIENT_CLINIC_OR_DEPARTMENT_OTHER): Payer: Medicare Other | Admitting: Anesthesiology

## 2023-02-16 ENCOUNTER — Ambulatory Visit (HOSPITAL_COMMUNITY): Payer: Medicare Other | Admitting: Anesthesiology

## 2023-02-16 ENCOUNTER — Other Ambulatory Visit: Payer: Self-pay

## 2023-02-16 ENCOUNTER — Ambulatory Visit (HOSPITAL_COMMUNITY)
Admission: RE | Admit: 2023-02-16 | Discharge: 2023-02-16 | Disposition: A | Discharge status: Home / Self Care | Payer: Medicare Other | Source: Ambulatory Visit | Attending: Cardiovascular Disease | Admitting: Cardiovascular Disease

## 2023-02-16 ENCOUNTER — Encounter (HOSPITAL_COMMUNITY)
Admission: RE | Disposition: A | Discharge status: Home / Self Care | Payer: Self-pay | Source: Ambulatory Visit | Attending: Cardiovascular Disease

## 2023-02-16 DIAGNOSIS — Z7901 Long term (current) use of anticoagulants: Secondary | ICD-10-CM | POA: Insufficient documentation

## 2023-02-16 DIAGNOSIS — I4891 Unspecified atrial fibrillation: Secondary | ICD-10-CM | POA: Diagnosis not present

## 2023-02-16 DIAGNOSIS — D6869 Other thrombophilia: Secondary | ICD-10-CM | POA: Insufficient documentation

## 2023-02-16 DIAGNOSIS — E785 Hyperlipidemia, unspecified: Secondary | ICD-10-CM | POA: Diagnosis not present

## 2023-02-16 DIAGNOSIS — I7123 Aneurysm of the descending thoracic aorta, without rupture: Secondary | ICD-10-CM | POA: Insufficient documentation

## 2023-02-16 DIAGNOSIS — I4819 Other persistent atrial fibrillation: Secondary | ICD-10-CM | POA: Diagnosis not present

## 2023-02-16 DIAGNOSIS — I10 Essential (primary) hypertension: Secondary | ICD-10-CM | POA: Insufficient documentation

## 2023-02-16 HISTORY — PX: ATRIAL FIBRILLATION ABLATION: EP1191

## 2023-02-16 LAB — POCT ACTIVATED CLOTTING TIME: Activated Clotting Time: 293 s

## 2023-02-16 SURGERY — ATRIAL FIBRILLATION ABLATION
Anesthesia: General

## 2023-02-16 MED ORDER — ACETAMINOPHEN 325 MG PO TABS: 650.0000 mg | ORAL_TABLET | ORAL | Status: DC | PRN

## 2023-02-16 MED ORDER — PROPOFOL 10 MG/ML IV BOLUS
INTRAVENOUS | Status: DC | PRN
  Administered 2023-02-16: 100 mg via INTRAVENOUS
  Administered 2023-02-16: 20 mg via INTRAVENOUS

## 2023-02-16 MED ORDER — FENTANYL CITRATE (PF) 250 MCG/5ML IJ SOLN
INTRAMUSCULAR | Status: DC | PRN
  Administered 2023-02-16 (×2): 25 ug via INTRAVENOUS

## 2023-02-16 MED ORDER — PROPOFOL 500 MG/50ML IV EMUL
INTRAVENOUS | Status: DC | PRN
Start: 2023-02-16 — End: 2023-02-16
  Administered 2023-02-16: 25 ug/kg/min via INTRAVENOUS

## 2023-02-16 MED ORDER — SODIUM CHLORIDE 0.9% FLUSH: 3.0000 mL | INTRAVENOUS | Status: DC | PRN

## 2023-02-16 MED ORDER — PROTAMINE SULFATE 10 MG/ML IV SOLN
INTRAVENOUS | Status: DC | PRN
Start: 2023-02-16 — End: 2023-02-16
  Administered 2023-02-16: 50 mg via INTRAVENOUS

## 2023-02-16 MED ORDER — HEPARIN SODIUM (PORCINE) 1000 UNIT/ML IJ SOLN
INTRAMUSCULAR | Status: DC | PRN
  Administered 2023-02-16: 1000 [IU] via INTRAVENOUS

## 2023-02-16 MED ORDER — DEXMEDETOMIDINE HCL IN NACL 80 MCG/20ML IV SOLN
INTRAVENOUS | Status: DC | PRN
  Administered 2023-02-16: 12 ug via INTRAVENOUS

## 2023-02-16 MED ORDER — HEPARIN (PORCINE) IN NACL 1000-0.9 UT/500ML-% IV SOLN
INTRAVENOUS | Status: DC | PRN
  Administered 2023-02-16 (×3): 500 mL

## 2023-02-16 MED ORDER — SODIUM CHLORIDE 0.9 % IV SOLN: 250.0000 mL | INTRAVENOUS | Status: DC | PRN

## 2023-02-16 MED ORDER — ACETAMINOPHEN 500 MG PO TABS
1000.0000 mg | ORAL_TABLET | Freq: Once | ORAL | Status: AC
  Administered 2023-02-16: 1000 mg via ORAL
  Filled 2023-02-16: qty 2

## 2023-02-16 MED ORDER — LIDOCAINE 2% (20 MG/ML) 5 ML SYRINGE
INTRAMUSCULAR | Status: DC | PRN
  Administered 2023-02-16: 60 mg via INTRAVENOUS

## 2023-02-16 MED ORDER — SODIUM CHLORIDE 0.9% FLUSH: 3.0000 mL | Freq: Two times a day (BID) | INTRAVENOUS | Status: DC

## 2023-02-16 MED ORDER — ROCURONIUM BROMIDE 10 MG/ML (PF) SYRINGE
PREFILLED_SYRINGE | INTRAVENOUS | Status: DC | PRN
  Administered 2023-02-16: 40 mg via INTRAVENOUS
  Administered 2023-02-16: 10 mg via INTRAVENOUS
  Administered 2023-02-16: 20 mg via INTRAVENOUS

## 2023-02-16 MED ORDER — SODIUM CHLORIDE 0.9 % IV SOLN: INTRAVENOUS | Status: DC

## 2023-02-16 MED ORDER — ONDANSETRON HCL 4 MG/2ML IJ SOLN: 4.0000 mg | Freq: Four times a day (QID) | INTRAMUSCULAR | Status: DC | PRN

## 2023-02-16 MED ORDER — SUGAMMADEX SODIUM 200 MG/2ML IV SOLN
INTRAVENOUS | Status: DC | PRN
  Administered 2023-02-16: 200 mg via INTRAVENOUS

## 2023-02-16 MED ORDER — FENTANYL CITRATE (PF) 100 MCG/2ML IJ SOLN: 25.0000 ug | INTRAMUSCULAR | Status: DC | PRN

## 2023-02-16 MED ORDER — ONDANSETRON HCL 4 MG/2ML IJ SOLN
INTRAMUSCULAR | Status: DC | PRN
  Administered 2023-02-16: 4 mg via INTRAVENOUS

## 2023-02-16 MED ORDER — DEXAMETHASONE SODIUM PHOSPHATE 10 MG/ML IJ SOLN
INTRAMUSCULAR | Status: DC | PRN
  Administered 2023-02-16: 10 mg via INTRAVENOUS

## 2023-02-16 MED ORDER — ONDANSETRON HCL 4 MG/2ML IJ SOLN: 4.0000 mg | Freq: Once | INTRAMUSCULAR | Status: DC | PRN

## 2023-02-16 MED ORDER — ATROPINE SULFATE 1 MG/ML IV SOLN
INTRAVENOUS | Status: DC | PRN
Start: 2023-02-16 — End: 2023-02-16
  Administered 2023-02-16: 1 mg via INTRAVENOUS

## 2023-02-16 MED ORDER — HEPARIN SODIUM (PORCINE) 1000 UNIT/ML IJ SOLN
INTRAMUSCULAR | Status: DC | PRN
Start: 2023-02-16 — End: 2023-02-16
  Administered 2023-02-16: 3000 [IU] via INTRAVENOUS
  Administered 2023-02-16: 9000 [IU] via INTRAVENOUS

## 2023-02-16 SURGICAL SUPPLY — 22 items
BAG SNAP BAND KOVER 36X36 (MISCELLANEOUS) IMPLANT
BLANKET WARM UNDERBOD FULL ACC (MISCELLANEOUS) ×1 IMPLANT
CABLE PFA RX CATH CONN (CABLE) IMPLANT
CATH FARAWAVE ABLATION 31 (CATHETERS) IMPLANT
CATH OCTARAY 2.0 F 3-3-3-3-3 (CATHETERS) IMPLANT
CATH SOUNDSTAR ECO 8FR (CATHETERS) IMPLANT
CATH WEB BI DIR CSDF CRV REPRO (CATHETERS) IMPLANT
CLOSURE PERCLOSE PROSTYLE (VASCULAR PRODUCTS) IMPLANT
COVER SWIFTLINK CONNECTOR (BAG) ×1 IMPLANT
DEVICE CLOSURE MYNXGRIP 6/7F (Vascular Products) IMPLANT
DILATOR VESSEL 38 20CM 16FR (INTRODUCER) IMPLANT
GUIDEWIRE INQWIRE 1.5J.035X260 (WIRE) IMPLANT
INQWIRE 1.5J .035X260CM (WIRE) ×1
PACK EP LATEX FREE (CUSTOM PROCEDURE TRAY) ×1
PACK EP LF (CUSTOM PROCEDURE TRAY) ×1 IMPLANT
PAD DEFIB RADIO PHYSIO CONN (PAD) ×1 IMPLANT
PATCH CARTO3 (PAD) IMPLANT
SHEATH FARADRIVE STEERABLE (SHEATH) IMPLANT
SHEATH PINNACLE 7F 10CM (SHEATH) IMPLANT
SHEATH PINNACLE 8F 10CM (SHEATH) IMPLANT
SHEATH PINNACLE 9F 10CM (SHEATH) IMPLANT
SHEATH PROBE COVER 6X72 (BAG) IMPLANT

## 2023-02-16 NOTE — Transfer of Care (Signed)
Immediate Anesthesia Transfer of Care Note  Patient: Terri Alexander  Procedure(s) Performed: ATRIAL FIBRILLATION ABLATION  Patient Location: PACU and Cath Lab  Anesthesia Type:General  Level of Consciousness: awake  Airway & Oxygen Therapy: Patient Spontanous Breathing  Post-op Assessment: Report given to RN  Post vital signs: Reviewed and stable  Last Vitals:  Vitals Value Taken Time  BP    Temp    Pulse    Resp    SpO2      Last Pain:  Vitals:   02/16/23 0624  TempSrc: Oral  PainSc:       Patients Stated Pain Goal: 4 (02/16/23 0617)  Complications: There were no known notable events for this encounter.

## 2023-02-16 NOTE — Interval H&P Note (Signed)
History and Physical Interval Note:  02/16/2023 7:22 AM  Terri Alexander  has presented today for surgery, with the diagnosis of atrial fibrillation.  The various methods of treatment have been discussed with the patient and family. After consideration of risks, benefits and other options for treatment, the patient has consented to  Procedure(s): ATRIAL FIBRILLATION ABLATION (N/A) as a surgical intervention.  The patient's history has been reviewed, patient examined, no change in status, stable for surgery.  I have reviewed the patient's chart and labs.  Questions were answered to the patient's satisfaction.     Roberts Gaudy Shawne Eskelson

## 2023-02-16 NOTE — Progress Notes (Signed)
Up and walked and tolerated well; bilat groins stable, no bleeding or hematoma 

## 2023-02-16 NOTE — Anesthesia Procedure Notes (Signed)
Procedure Name: Intubation Date/Time: 02/16/2023 8:47 AM  Performed by: Vena Austria, CRNAPre-anesthesia Checklist: Patient identified, Emergency Drugs available, Suction available, Patient being monitored and Timeout performed Patient Re-evaluated:Patient Re-evaluated prior to induction Oxygen Delivery Method: Circle system utilized Preoxygenation: Pre-oxygenation with 100% oxygen Induction Type: IV induction Ventilation: Mask ventilation without difficulty Laryngoscope Size: Miller and 2 Grade View: Grade II Tube type: Oral Tube size: 6.5 mm Number of attempts: 1 Airway Equipment and Method: Stylet Placement Confirmation: ETT inserted through vocal cords under direct vision, positive ETCO2, CO2 detector and breath sounds checked- equal and bilateral Secured at: 20 cm Tube secured with: Tape Dental Injury: Teeth and Oropharynx as per pre-operative assessment

## 2023-02-16 NOTE — Anesthesia Preprocedure Evaluation (Addendum)
Anesthesia Evaluation  Patient identified by MRN, date of birth, ID band Patient awake    Reviewed: Allergy & Precautions, NPO status , Patient's Chart, lab work & pertinent test results, reviewed documented beta blocker date and time   Airway Mallampati: III  TM Distance: >3 FB Neck ROM: Full    Dental  (+) Teeth Intact, Dental Advisory Given   Pulmonary neg pulmonary ROS   Pulmonary exam normal breath sounds clear to auscultation       Cardiovascular hypertension, Pt. on medications and Pt. on home beta blockers Normal cardiovascular exam+ dysrhythmias Atrial Fibrillation  Rhythm:Regular Rate:Normal  Echo 08/18/22:  1. Left ventricular ejection fraction, by estimation, is 60 to 65%. The  left ventricle has normal function. The left ventricle has no regional  wall motion abnormalities. Left ventricular diastolic function could not  be evaluated.   2. Right ventricular systolic function is normal. The right ventricular  size is normal. There is normal pulmonary artery systolic pressure. The  estimated right ventricular systolic pressure is 26.5 mmHg.   3. Left atrial size was mild to moderately dilated.   4. Right atrial size was mildly dilated.   5. The mitral valve is normal in structure. Mild mitral valve  regurgitation. No evidence of mitral stenosis.   6. Tricuspid valve regurgitation is mild to moderate.   7. The aortic valve is tricuspid. Aortic valve regurgitation is not  visualized. No aortic stenosis is present.   8. The inferior vena cava is dilated in size with >50% respiratory  variability, suggesting right atrial pressure of 8 mmHg.     Neuro/Psych negative neurological ROS     GI/Hepatic Neg liver ROS,GERD  Medicated,,  Endo/Other  negative endocrine ROS    Renal/GU negative Renal ROS     Musculoskeletal negative musculoskeletal ROS (+)    Abdominal   Peds  Hematology  (+) Blood dyscrasia (Eliquis)    Anesthesia Other Findings Day of surgery medications reviewed with the patient.  Reproductive/Obstetrics                              Anesthesia Physical Anesthesia Plan  ASA: 3  Anesthesia Plan: General   Post-op Pain Management: Tylenol PO (pre-op)*   Induction: Intravenous  PONV Risk Score and Plan: 3 and Dexamethasone and Ondansetron  Airway Management Planned: Oral ETT  Additional Equipment:   Intra-op Plan:   Post-operative Plan: Extubation in OR  Informed Consent: I have reviewed the patients History and Physical, chart, labs and discussed the procedure including the risks, benefits and alternatives for the proposed anesthesia with the patient or authorized representative who has indicated his/her understanding and acceptance.     Dental advisory given  Plan Discussed with: CRNA  Anesthesia Plan Comments:        Anesthesia Quick Evaluation

## 2023-02-16 NOTE — Anesthesia Postprocedure Evaluation (Signed)
Anesthesia Post Note  Patient: Terri Alexander  Procedure(s) Performed: ATRIAL FIBRILLATION ABLATION     Patient location during evaluation: PACU Anesthesia Type: General Level of consciousness: awake and alert Pain management: pain level controlled Vital Signs Assessment: post-procedure vital signs reviewed and stable Respiratory status: spontaneous breathing, nonlabored ventilation, respiratory function stable and patient connected to nasal cannula oxygen Cardiovascular status: blood pressure returned to baseline and stable Postop Assessment: no apparent nausea or vomiting Anesthetic complications: no   There were no known notable events for this encounter.  Last Vitals:  Vitals:   02/16/23 1105 02/16/23 1110  BP: (!) 142/64 (!) 145/68  Pulse: (!) 56 (!) 56  Resp: 14 20  Temp:  (!) 36.4 C  SpO2: 98% 97%    Last Pain:  Vitals:   02/16/23 1042  TempSrc: Temporal  PainSc:                  Collene Schlichter

## 2023-02-16 NOTE — Discharge Instructions (Signed)

## 2023-02-17 ENCOUNTER — Encounter (HOSPITAL_COMMUNITY): Payer: Self-pay | Admitting: Cardiovascular Disease

## 2023-02-18 ENCOUNTER — Ambulatory Visit: Payer: Medicare Other | Admitting: Vascular Surgery

## 2023-02-21 NOTE — Progress Notes (Unsigned)
Office Note    HPI: Terri Alexander is a 72 y.o. (1951/03/04) female presenting at the request of .Donita Brooks, MD with a known type B aortic dissection.  The pt is *** on a statin for cholesterol management.  The pt is *** on a daily aspirin.   Other AC:  *** The pt is *** on medication for hypertension.   The pt is *** diabetic.  Tobacco hx:  ***  Past Medical History:  Diagnosis Date   Adenomatous colon polyp    Allergy    Anemia    as a child, no current problem   Atrial fibrillation (HCC)    Blood transfusion without reported diagnosis    25 months old   Cataract    bilateral - MD just watching    GERD (gastroesophageal reflux disease)    Hyperlipidemia    Osteoporosis 04/15/2016   Skin cancer 2013   Skin cancer- Nose, face    Past Surgical History:  Procedure Laterality Date   ATRIAL FIBRILLATION ABLATION N/A 02/16/2023   Procedure: ATRIAL FIBRILLATION ABLATION;  Surgeon: Maurice Small, MD;  Location: [facility];  Service: Cardiovascular;  Laterality: N/A;   CARDIOVERSION N/A 09/09/2022   Procedure: CARDIOVERSION;  Surgeon: [name], MD;  Location: [facility];  Service: Cardiovascular;  Laterality: N/A;   CESAREAN SECTION  1981   x 1   COLONOSCOPY  2016   hx polyp, tics, sigmoid colon mod tortuous Dr Dory Peru,    EYE SURGERY Bilateral    retina repair x 2   LAPAROTOMY  1979   removal of ovarian cyst   nissan fundiplication  05/17/1997   REPAIR OF COMPLEX TRACTION RETINAL DETACHMENT Bilateral 01/2019    Social History   Socioeconomic History   Marital status: Married    Spouse name: Not on file   Number of children: Not on file   Years of education: Not on file   Highest education level: 12th grade  Occupational History   Not on file  Tobacco Use   Smoking status: Never   Smokeless tobacco: Never  Vaping Use   Vaping status: Never Used  Substance and Sexual Activity   Alcohol use: Yes    Alcohol/week: 1.0 standard drink of  alcohol    Types: 1 Glasses of wine per week    Comment: rarely   Drug use: No   Sexual activity: Not Currently    Birth control/protection: Post-menopausal  Other Topics Concern   Not on file  Social History Narrative   Not on file   Social Determinants of Health   Financial Resource Strain: Low Risk  (08/09/2022)   Overall Financial Resource Strain (CARDIA)    Difficulty of Paying Living Expenses: Not hard at all  Food Insecurity: No Food Insecurity (08/23/2022)   Hunger Vital Sign    Worried About Running Out of Food in the Last Year: Never true    Ran Out of Food in the Last Year: Never true  Transportation Needs: No Transportation Needs (08/09/2022)   PRAPARE - Administrator, Civil Service (Medical): No    Lack of Transportation (Non-Medical): No  Physical Activity: Unknown (08/09/2022)   Exercise Vital Sign    Days of Exercise per Week: 3 days    Minutes of Exercise per Session: Not on file  Stress: No Stress Concern Present (08/09/2022)   [employer] of Occupational Health - Occupational Stress Questionnaire    Feeling of Stress :  Only a little  Social Connections: Moderately Integrated (08/09/2022)   Social Connection and Isolation Panel [NHANES]    Frequency of Communication with Friends and Family: Three times a week    Frequency of Social Gatherings with Friends and Family: Twice a week    Attends Religious Services: 1 to 4 times per year    Active Member of [employer] or Organizations: No    Attends Engineer, structural: Not on file    Marital Status: Married  Catering manager Violence: Not on file   *** Family History  Problem Relation Age of Onset   Arthritis Mother    Heart disease Mother    Vision loss Mother    Stroke Mother    Depression Sister    Transient ischemic attack Sister 75   Depression Sister    Cancer Maternal Grandmother    Cancer Maternal Grandfather    Early death Maternal Grandfather    Heart disease Maternal  Grandfather    Early death Paternal Grandfather    Heart disease Paternal Grandfather    Diabetes Other        Juvenile   Colon cancer Neg Hx    Rectal cancer Neg Hx    Prostate cancer Neg Hx     Current Outpatient Medications  Medication Sig Dispense Refill   acetaminophen (TYLENOL) 500 MG tablet Take 1,000 mg by mouth every 6 (six) hours as needed for moderate pain.     alendronate (FOSAMAX) 70 MG tablet TAKE 1 TABLET BY MOUTH ONCE A WEEK ON AN EMPTY STOMACH WITH A FULL GLASS OF WATER (Patient taking differently: Take 70 mg by mouth every Friday.) 12 tablet 3   apixaban (ELIQUIS) 5 MG TABS tablet Take 1 tablet (5 mg total) by mouth 2 (two) times daily. 180 tablet 1   Ascorbic Acid (VITAMIN C) 1000 MG tablet Take 1,000 mg by mouth in the morning.     Biotin 1000 MCG tablet Take 1,000 mcg by mouth daily.     Calcium Carbonate-Vit D-Min (CALCIUM 1200 PO) Take 1 tablet by mouth in the morning.     Calcium Polycarbophil (FIBER-CAPS PO) Take 1 capsule by mouth in the morning.     cetirizine (ZYRTEC) 10 MG tablet Take 10 mg by mouth at bedtime.     Cholecalciferol (VITAMIN D3) 125 MCG (5000 UT) TABS Take 5,000 Units by mouth in the morning.     Cranberry 500 MG CHEW Chew 500 mg by mouth in the morning.     docusate sodium (COLACE) 100 MG capsule Take 100 mg by mouth in the morning.     famotidine (PEPCID) 20 MG tablet Take 20 mg by mouth daily as needed for heartburn or indigestion.     furosemide (LASIX) 40 MG tablet Take 1 tablet (40 mg total) by mouth daily. (Patient taking differently: Take 40 mg by mouth daily as needed for edema.) 30 tablet 3   L-Lysine 500 MG CAPS Take 500 mg by mouth in the morning.     Lidocaine (SALONPAS PAIN RELIEVING EX) Apply 1 Application topically daily as needed (pain).     Menthol-Methyl Salicylate (SALONPAS PAIN RELIEF PATCH EX) Apply 1 patch topically daily as needed (pain).     metoprolol succinate (TOPROL-XL) 25 MG 24 hr tablet Take 1 tablet (25 mg total)  by mouth daily. Take with or immediately following a meal. (Patient taking differently: Take 12.5 mg by mouth in the morning. Take with or immediately following a meal.) 90 tablet 1  Multiple Vitamin (MULTIVITAMIN WITH MINERALS) TABS tablet Take 1 tablet by mouth in the morning.     omeprazole (PRILOSEC OTC) 20 MG tablet Take 20 mg by mouth daily as needed (acid reflux).     Probiotic Product (PROBIOTIC PO) Take 1 tablet by mouth in the morning.     No current facility-administered medications for this visit.    No Known Allergies   REVIEW OF SYSTEMS:  *** [X]  denotes positive finding, [ ]  denotes negative finding Cardiac  Comments:  Chest pain or chest pressure:    Shortness of breath upon exertion:    Short of breath when lying flat:    Irregular heart rhythm:        Vascular    Pain in calf, thigh, or hip brought on by ambulation:    Pain in feet at night that wakes you up from your sleep:     Blood clot in your veins:    Leg swelling:         Pulmonary    Oxygen at home:    Productive cough:     Wheezing:         Neurologic    Sudden weakness in arms or legs:     Sudden numbness in arms or legs:     Sudden onset of difficulty speaking or slurred speech:    Temporary loss of vision in one eye:     Problems with dizziness:         Gastrointestinal    Blood in stool:     Vomited blood:         Genitourinary    Burning when urinating:     Blood in urine:        Psychiatric    Major depression:         Hematologic    Bleeding problems:    Problems with blood clotting too easily:        Skin    Rashes or ulcers:        Constitutional    Fever or chills:      PHYSICAL EXAMINATION:  There were no vitals filed for this visit.  General:  WDWN in NAD; vital signs documented above Gait: Not observed HENT: WNL, normocephalic Pulmonary: normal non-labored breathing , without wheezing Cardiac: {Desc; regular/irreg:14544} HR Abdomen: soft, NT, no  masses Skin: {With/Without:20273} rashes Vascular Exam/Pulses:  Right Left  Radial {Exam; arterial pulse strength 0-4:30167} {Exam; arterial pulse strength 0-4:30167}  Ulnar {Exam; arterial pulse strength 0-4:30167} {Exam; arterial pulse strength 0-4:30167}  Femoral {Exam; arterial pulse strength 0-4:30167} {Exam; arterial pulse strength 0-4:30167}  Popliteal {Exam; arterial pulse strength 0-4:30167} {Exam; arterial pulse strength 0-4:30167}  DP {Exam; arterial pulse strength 0-4:30167} {Exam; arterial pulse strength 0-4:30167}  PT {Exam; arterial pulse strength 0-4:30167} {Exam; arterial pulse strength 0-4:30167}   Extremities: {With/Without:20273} ischemic changes, {With/Without:20273} Gangrene , {With/Without:20273} cellulitis; {With/Without:20273} open wounds;  Musculoskeletal: no muscle wasting or atrophy  Neurologic: A&O X 3;  No focal weakness or paresthesias are detected Psychiatric:  The pt has {Desc; normal/abnormal:11317::"Normal"} affect.   Non-Invasive Vascular Imaging:        ASSESSMENT/PLAN: Terri Alexander is a 72 y.o. female presenting with ***   No change One more 6 m then yearly    Victorino Sparrow, MD [facility] [phone]

## 2023-02-24 ENCOUNTER — Encounter: Payer: Self-pay | Admitting: Vascular Surgery

## 2023-02-24 ENCOUNTER — Ambulatory Visit (INDEPENDENT_AMBULATORY_CARE_PROVIDER_SITE_OTHER): Payer: Medicare Other | Admitting: Vascular Surgery

## 2023-02-24 VITALS — BP 143/74 | HR 58 | Temp 98.2°F | Resp 20 | Ht 62.0 in | Wt 133.0 lb

## 2023-02-24 DIAGNOSIS — I7123 Aneurysm of the descending thoracic aorta, without rupture: Secondary | ICD-10-CM

## 2023-02-24 DIAGNOSIS — I71019 Dissection of thoracic aorta, unspecified: Secondary | ICD-10-CM

## 2023-03-03 NOTE — Progress Notes (Signed)
Noted. Looks unchanged. I see she has regular follow up with vascular surgery. Thank you for taking care of her.  Jaidev Sanger

## 2023-03-04 ENCOUNTER — Ambulatory Visit: Payer: Medicare Other | Admitting: Cardiovascular Disease

## 2023-03-16 ENCOUNTER — Other Ambulatory Visit (HOSPITAL_COMMUNITY): Payer: Medicare Other

## 2023-03-16 ENCOUNTER — Ambulatory Visit (HOSPITAL_COMMUNITY): Payer: Medicare Other | Admitting: Internal Medicine

## 2023-03-18 ENCOUNTER — Ambulatory Visit (HOSPITAL_COMMUNITY)
Admission: RE | Admit: 2023-03-18 | Discharge: 2023-03-18 | Disposition: A | Discharge status: Home / Self Care | Payer: Medicare Other | Source: Ambulatory Visit | Attending: Internal Medicine | Admitting: Internal Medicine

## 2023-03-18 VITALS — BP 132/72 | HR 57 | Ht 62.0 in | Wt 135.0 lb

## 2023-03-18 DIAGNOSIS — Z8249 Family history of ischemic heart disease and other diseases of the circulatory system: Secondary | ICD-10-CM | POA: Diagnosis not present

## 2023-03-18 DIAGNOSIS — K219 Gastro-esophageal reflux disease without esophagitis: Secondary | ICD-10-CM | POA: Insufficient documentation

## 2023-03-18 DIAGNOSIS — E785 Hyperlipidemia, unspecified: Secondary | ICD-10-CM | POA: Insufficient documentation

## 2023-03-18 DIAGNOSIS — Z79899 Other long term (current) drug therapy: Secondary | ICD-10-CM | POA: Insufficient documentation

## 2023-03-18 DIAGNOSIS — D6869 Other thrombophilia: Secondary | ICD-10-CM | POA: Insufficient documentation

## 2023-03-18 DIAGNOSIS — I4891 Unspecified atrial fibrillation: Secondary | ICD-10-CM

## 2023-03-18 DIAGNOSIS — I4819 Other persistent atrial fibrillation: Secondary | ICD-10-CM | POA: Insufficient documentation

## 2023-03-18 DIAGNOSIS — Z7901 Long term (current) use of anticoagulants: Secondary | ICD-10-CM | POA: Diagnosis not present

## 2023-03-18 MED ORDER — APIXABAN 5 MG PO TABS: 5.0000 mg | ORAL_TABLET | Freq: Two times a day (BID) | ORAL | 2 refills | Status: DC

## 2023-03-18 NOTE — Progress Notes (Signed)
Primary Care Physician: Donita Brooks, MD Primary Cardiologist: Dr. Wyline Mood Primary Electrophysiologist: None Referring Physician:    [name] is a 72 y.o. female with a history of HLD, GERD, descending thoracic aortic aneurysm versus chronic type B aortic dissection, significant laminated thrombus within aneurysm, and persistent atrial fibrillation who presents for consultation in the [facility]. The patient was initially diagnosed with atrial fibrillation on 3/25 by PCP and was started on Eliquis 5 mg BID. Patient admits to not starting anticoagulation at that time yet. She saw PCP on f/u 3/28 and Toprol was increased to 100 mg daily. She presented to the ED at [facility] on 4/2 with chest pain and shortness of breath. Review of records show she took two full doses of Eliquis on 4/3. She did not have surgery for vascular finding, believed to be chronic, and has follow up with them outpatient for repeat CT. She was discharged on diltiazem 60 mg BID and Lopressor 25 mg BID. Patient is on Eliquis 5 mg BID for a CHADS2VASC score of 3.  On evaluation 08/25/22, she is currently in Afib. She states she does not really feel it at this time. She states it is not like when she had chest pain and shortness of breath on day of ED visit. She is transitioning to Dr. Wyline Mood for new cardiologist. She feels tired when she does activity but otherwise in office feels okay. She does not drink alcohol. She does not snore or stop breathing per husband.   Mother and aunt with history of Afib.  On follow up 09/20/22, she is currently in Afib. She is s/p successful DCCV on 4/25 with conversion to NSR x 2 shocks. Saw Dr. Wyline Mood on 5/2 and was back in Afib. Patient states she was in normal rhythm for about a day and a half. After discussion with Dr. Wyline Mood, patient was considering Tikosyn load. Metoprolol changed to Toprol 50 mg daily. Since office visit, she has felt mild chest pressure  over the weekend but has resolved. She doesn't really feel tired while in Afib. She has questions about Tikosyn and also has questions about ablation procedure.  On follow up 03/18/23, she is currently in NSR. S/p Afib ablation on 02/16/23 by Dr. Nelly Laurence. No episodes of Afib since ablation. She feels well overall. No chest pain, SOB, or trouble swallowing. Leg sites healed without issue. No missed doses of anticoagulant.   Today, she denies symptoms of orthopnea, PND, lower extremity edema, dizziness, presyncope, syncope, snoring, daytime somnolence, bleeding, or neurologic sequela. The patient is tolerating medications without difficulties and is otherwise without complaint today.    Atrial Fibrillation Risk Factors:  she does not have symptoms or diagnosis of sleep apnea. she does not have a history of rheumatic fever. she does not have a history of alcohol use. The patient does have a history of early familial atrial fibrillation or other arrhythmias.  she has a BMI of Body mass index is 24.69 kg/m.Marland Kitchen Filed Weights   03/18/23 0948  Weight: 61.2 kg    Family History  Problem Relation Age of Onset   Arthritis Mother    Heart disease Mother    Vision loss Mother    Stroke Mother    Depression Sister    Transient ischemic attack Sister 28   Depression Sister    Cancer Maternal Grandmother    Cancer Maternal Grandfather    Early death Maternal Grandfather    Heart disease Maternal Grandfather  Early death Paternal Grandfather    Heart disease Paternal Grandfather    Diabetes Other        Juvenile   Colon cancer Neg Hx    Rectal cancer Neg Hx    Prostate cancer Neg Hx      Atrial Fibrillation Management history:  Previous antiarrhythmic drugs: amiodarone Previous cardioversions: 09/09/22 Previous ablations: 02/16/23 Anticoagulation history: Eliquis 5 mg BID    Past Medical History:  Diagnosis Date   Adenomatous colon polyp    Allergy    Anemia    as a child, no  current problem   Atrial fibrillation (HCC)    Blood transfusion without reported diagnosis    49 months old   Cataract    bilateral - MD just watching    GERD (gastroesophageal reflux disease)    Hyperlipidemia    Osteoporosis 04/15/2016   Skin cancer 2013   Skin cancer- Nose, face   Past Surgical History:  Procedure Laterality Date   ATRIAL FIBRILLATION ABLATION N/A 02/16/2023   Procedure: ATRIAL FIBRILLATION ABLATION;  Surgeon: Maurice Small, MD;  Location: [facility];  Service: Cardiovascular;  Laterality: N/A;   CARDIOVERSION N/A 09/09/2022   Procedure: CARDIOVERSION;  Surgeon: [name], MD;  Location: [facility];  Service: Cardiovascular;  Laterality: N/A;   CESAREAN SECTION  1981   x 1   COLONOSCOPY  2016   hx polyp, tics, sigmoid colon mod tortuous Dr Dory Peru,    EYE SURGERY Bilateral    retina repair x 2   LAPAROTOMY  1979   removal of ovarian cyst   nissan fundiplication  05/17/1997   REPAIR OF COMPLEX TRACTION RETINAL DETACHMENT Bilateral 01/2019    Current Outpatient Medications  Medication Sig Dispense Refill   acetaminophen (TYLENOL) 500 MG tablet Take 1,000 mg by mouth every 6 (six) hours as needed for moderate pain.     alendronate (FOSAMAX) 70 MG tablet TAKE 1 TABLET BY MOUTH ONCE A WEEK ON AN EMPTY STOMACH WITH A FULL GLASS OF WATER (Patient taking differently: Take 70 mg by mouth every Friday.) 12 tablet 3   Ascorbic Acid (VITAMIN C) 1000 MG tablet Take 1,000 mg by mouth in the morning.     Biotin 1000 MCG tablet Take 1,000 mcg by mouth daily.     Calcium Carbonate-Vit D-Min (CALCIUM 1200 PO) Take 1 tablet by mouth in the morning.     Calcium Polycarbophil (FIBER-CAPS PO) Take 1 capsule by mouth in the morning.     cetirizine (ZYRTEC) 10 MG tablet Take 10 mg by mouth at bedtime.     Cholecalciferol (VITAMIN D3) 125 MCG (5000 UT) TABS Take 5,000 Units by mouth in the morning.     Cranberry 500 MG CHEW Chew 500 mg by mouth in the morning.      docusate sodium (COLACE) 100 MG capsule Take 100 mg by mouth in the morning.     famotidine (PEPCID) 20 MG tablet Take 20 mg by mouth daily as needed for heartburn or indigestion.     furosemide (LASIX) 40 MG tablet Take 1 tablet (40 mg total) by mouth daily. (Patient taking differently: Take 40 mg by mouth as needed for edema.) 30 tablet 3   L-Lysine 500 MG CAPS Take 500 mg by mouth in the morning.     Lidocaine (SALONPAS PAIN RELIEVING EX) Apply 1 Application topically daily as needed (pain).     Menthol-Methyl Salicylate (SALONPAS PAIN RELIEF PATCH EX) Apply 1 patch topically  daily as needed (pain).     metoprolol succinate (TOPROL-XL) 25 MG 24 hr tablet Take 1 tablet (25 mg total) by mouth daily. Take with or immediately following a meal. (Patient taking differently: Take 12.5 mg by mouth in the morning. Take with or immediately following a meal.) 90 tablet 1   Multiple Vitamin (MULTIVITAMIN WITH MINERALS) TABS tablet Take 1 tablet by mouth in the morning.     omeprazole (PRILOSEC OTC) 20 MG tablet Take 20 mg by mouth daily as needed (acid reflux).     Probiotic Product (PROBIOTIC PO) Take 1 tablet by mouth in the morning.     apixaban (ELIQUIS) 5 MG TABS tablet Take 1 tablet (5 mg total) by mouth 2 (two) times daily. 180 tablet 2   No current facility-administered medications for this encounter.    No Known Allergies  ROS- All systems are reviewed and negative except as per the HPI above.  Physical Exam: Vitals:   03/18/23 0948  BP: 132/72  Pulse: (!) 57  Weight: 61.2 kg  Height: 5\' 2"  (1.575 m)    GEN- The patient is well appearing, alert and oriented x 3 today.   Neck - no JVD or carotid bruit noted Lungs- Clear to ausculation bilaterally, normal work of breathing Heart- Regular rate and rhythm, no murmurs, rubs or gallops, PMI not laterally displaced Extremities- no clubbing, cyanosis, or edema Skin - no rash or ecchymosis noted  Wt Readings from Last 3 Encounters:   03/18/23 61.2 kg  02/24/23 60.3 kg  02/16/23 59.4 kg    ECG today demonstrates  Vent. rate 57 BPM PR interval 246 ms QRS duration 86 ms QT/QTcB 438/426 ms P-R-T axes 80 38 61 Sinus bradycardia with 1st degree A-V block Anterior infarct , age undetermined Abnormal ECG When compared with ECG of 16-Feb-2023 10:36, PREVIOUS ECG IS PRESENT  Echo 08/18/22 demonstrated:  1. Left ventricular ejection fraction, by estimation, is 60 to 65%. The  left ventricle has normal function. The left ventricle has no regional  wall motion abnormalities. Left ventricular diastolic function could not  be evaluated.   2. Right ventricular systolic function is normal. The right ventricular  size is normal. There is normal pulmonary artery systolic pressure. The  estimated right ventricular systolic pressure is 26.5 mmHg.   3. Left atrial size was mild to moderately dilated.   4. Right atrial size was mildly dilated.   5. The mitral valve is normal in structure. Mild mitral valve  regurgitation. No evidence of mitral stenosis.   6. Tricuspid valve regurgitation is mild to moderate.   7. The aortic valve is tricuspid. Aortic valve regurgitation is not  visualized. No aortic stenosis is present.   8. The inferior vena cava is dilated in size with >50% respiratory  variability, suggesting right atrial pressure of 8 mmHg.   Epic records are reviewed at length today.  CHA2DS2-VASc Score = 3  The patient's score is based upon: CHF History: 0 HTN History: 0 Diabetes History: 0 Stroke History: 0 Vascular Disease History: 1 Age Score: 1 Gender Score: 1       ASSESSMENT AND PLAN: Persistent Atrial Fibrillation (ICD10:  I48.19) The patient's CHA2DS2-VASc score is 3, indicating a 3.2% annual risk of stroke.   S/p successful DCCV on 09/09/22. S/p Afib ablation on 02/16/23 by Dr. Nelly Laurence.  She is currently in NSR.  Continue Toprol 25 mg daily.    2. Secondary Hypercoagulable State (ICD10:   D68.69) The patient is at significant  risk for stroke/thromboembolism based upon her CHA2DS2-VASc Score of 3.  Continue Apixaban (Eliquis).   Education on compliance provided. No missed doses.   Follow up as scheduled with Dr. Nelly Laurence.    Lake Bells, PA-C [facility] [address] [place], [place] [address] [phone] 03/18/2023 10:20 AM

## 2023-03-30 ENCOUNTER — Ambulatory Visit: Payer: Medicare Other | Attending: Internal Medicine | Admitting: Internal Medicine

## 2023-03-30 ENCOUNTER — Encounter: Payer: Self-pay | Admitting: Internal Medicine

## 2023-03-30 VITALS — BP 124/86 | HR 56 | Ht 62.0 in | Wt 135.2 lb

## 2023-03-30 DIAGNOSIS — I4891 Unspecified atrial fibrillation: Secondary | ICD-10-CM | POA: Insufficient documentation

## 2023-03-30 NOTE — Patient Instructions (Signed)
Medication Instructions:  NO CHANGES    *If you need a refill on your cardiac medications before your next appointment, please call your pharmacy*   Lab Work: NONE    If you have labs (blood work) drawn today and your tests are completely normal, you will receive your results only by: MyChart Message (if you have MyChart) OR A paper copy in the mail If you have any lab test that is abnormal or we need to change your treatment, we will call you to review the results.   Testing/Procedures: NONE    Follow-Up: At [facility], you and your health needs are our priority.  As part of our continuing mission to provide you with exceptional heart care, we have created designated Provider Care Teams.  These Care Teams include your primary Cardiologist (physician) and Advanced Practice Providers (APPs -  Physician Assistants and Nurse Practitioners) who all work together to provide you with the care you need, when you need it.  We recommend signing up for the patient portal called "MyChart".  Sign up information is provided on this After Visit Summary.  MyChart is used to connect with patients for Virtual Visits (Telemedicine).  Patients are able to view lab/test results, encounter notes, upcoming appointments, etc.  Non-urgent messages can be sent to your provider as well.   To learn more about what you can do with MyChart, go to [email or web address].    Your next appointment:   1 year(s)  The format for your next appointment:   In Person  Provider:   [name], MD    Other Instructions

## 2023-03-30 NOTE — Progress Notes (Signed)
Cardiology Office Note:    Date:  03/30/2023   ID:  BYANCA YOUNGBLOOD, DOB 11-11-1950, MRN [id number]  PCP:  Donita Brooks, MD   [facility] Providers Cardiologist:  [name], MD Electrophysiologist:  Maurice Small, MD     Referring MD: Donita Brooks, MD   No chief complaint on file. Persistent Atrial Fibrillation  History of Present Illness:    Terri Alexander is a 72 y.o. female with a hx of  HLD, GERD, descending thoracic aortic aneurysm versus chronic type B aortic dissection, significant laminated thrombus within aneurysm, and persistent atrial fibrillation. She was hospitalized 4/5 with afib. She was having SOB. Was treated for PNA AS well. Initially diagonsed 08/09/2022. She was seen in afib clinic. She's been on eliquis and diltiazem and BB. I cardioverted her 09/09/2022 to NSR.    She notes last Friday she may have gone back into afib. States not breathing as deep. No snoring or apnea. Blood pressure is normal  Interim hx 03/30/2023 She is status post atrial fibrillation ablation on 02/16/2023 I referred her to atrial fibrillation clinic which she saw on 11/1.    Current Medications: Current Outpatient Medications on File Prior to Visit  Medication Sig Dispense Refill   acetaminophen (TYLENOL) 500 MG tablet Take 1,000 mg by mouth every 6 (six) hours as needed for moderate pain.     alendronate (FOSAMAX) 70 MG tablet TAKE 1 TABLET BY MOUTH ONCE A WEEK ON AN EMPTY STOMACH WITH A FULL GLASS OF WATER (Patient taking differently: Take 70 mg by mouth every Friday.) 12 tablet 3   apixaban (ELIQUIS) 5 MG TABS tablet Take 1 tablet (5 mg total) by mouth 2 (two) times daily. 180 tablet 2   Ascorbic Acid (VITAMIN C) 1000 MG tablet Take 1,000 mg by mouth in the morning.     Biotin 1000 MCG tablet Take 1,000 mcg by mouth daily.     Calcium Carbonate-Vit D-Min (CALCIUM 1200 PO) Take 1 tablet by mouth in the morning.     Calcium Polycarbophil (FIBER-CAPS PO) Take  1 capsule by mouth in the morning.     cetirizine (ZYRTEC) 10 MG tablet Take 10 mg by mouth at bedtime.     Cholecalciferol (VITAMIN D3) 125 MCG (5000 UT) TABS Take 5,000 Units by mouth in the morning.     Cranberry 500 MG CHEW Chew 500 mg by mouth in the morning.     docusate sodium (COLACE) 100 MG capsule Take 100 mg by mouth in the morning.     famotidine (PEPCID) 20 MG tablet Take 20 mg by mouth daily as needed for heartburn or indigestion.     furosemide (LASIX) 40 MG tablet Take 1 tablet (40 mg total) by mouth daily. (Patient taking differently: Take 40 mg by mouth as needed for edema.) 30 tablet 3   L-Lysine 500 MG CAPS Take 500 mg by mouth in the morning.     Lidocaine (SALONPAS PAIN RELIEVING EX) Apply 1 Application topically daily as needed (pain).     Menthol-Methyl Salicylate (SALONPAS PAIN RELIEF PATCH EX) Apply 1 patch topically daily as needed (pain).     metoprolol succinate (TOPROL-XL) 25 MG 24 hr tablet Take 1 tablet (25 mg total) by mouth daily. Take with or immediately following a meal. (Patient taking differently: Take 12.5 mg by mouth in the morning. Take with or immediately following a meal.) 90 tablet 1   Multiple Vitamin (MULTIVITAMIN WITH MINERALS) TABS tablet Take 1  tablet by mouth in the morning.     omeprazole (PRILOSEC OTC) 20 MG tablet Take 20 mg by mouth daily as needed (acid reflux).     Probiotic Product (PROBIOTIC PO) Take 1 tablet by mouth in the morning.     No current facility-administered medications on file prior to visit.   ROS:   Please see the history of present illness.     All other systems reviewed and are negative.  EKGs/Labs/Other Studies Reviewed:    The following studies were reviewed today:   EKG:  EKG is  ordered today.  The ekg ordered today demonstrates   09/16/2022- afib rate 95 bpm  Echo 08/18/22 demonstrated:  1. Left ventricular ejection fraction, by estimation, is 60 to 65%. The  left ventricle has normal function. The left  ventricle has no regional  wall motion abnormalities. Left ventricular diastolic function could not  be evaluated.   2. Right ventricular systolic function is normal. The right ventricular  size is normal. There is normal pulmonary artery systolic pressure. The  estimated right ventricular systolic pressure is 26.5 mmHg.   3. Left atrial size was mild to moderately dilated.   4. Right atrial size was mildly dilated.   5. The mitral valve is normal in structure. Mild mitral valve  regurgitation. No evidence of mitral stenosis.   6. Tricuspid valve regurgitation is mild to moderate.   7. The aortic valve is tricuspid. Aortic valve regurgitation is not  visualized. No aortic stenosis is present.   8. The inferior vena cava is dilated in size with >50% respiratory  variability, suggesting right atrial pressure of 8 mmHg.   Recent Labs: 08/09/2022: ALT 21; TSH 2.41 08/17/2022: B Natriuretic Peptide 382.1 08/20/2022: Magnesium 2.1 02/03/2023: BUN 26; Creatinine, Ser 0.98; Hemoglobin 13.5; Platelets 324; Potassium 4.6; Sodium 143    Recent Lipid Panel    Component Value Date/Time   CHOL 145 06/07/2022 0757   TRIG 106 06/07/2022 0757   HDL 62 06/07/2022 0757   CHOLHDL 2.3 06/07/2022 0757   VLDL 23 03/15/2016 0842   LDLCALC 64 06/07/2022 0757     Risk Assessment/Calculations:    CHA2DS2-VASc Score = 3   This indicates a 3.2% annual risk of stroke. The patient's score is based upon: CHF History: 0 HTN History: 0 Diabetes History: 0 Stroke History: 0 Vascular Disease History: 1 Age Score: 1 Gender Score: 1      Physical Exam:    VS:     LMP  (LMP Unknown)     Wt Readings from Last 3 Encounters:  03/18/23 135 lb (61.2 kg)  02/24/23 133 lb (60.3 kg)  02/16/23 131 lb (59.4 kg)     GEN:  Well nourished, well developed in no acute distress HEENT: Normal NECK: No JVD CARDIAC: IRRR, no murmurs, rubs, gallops RESPIRATORY:  Clear to auscultation without rales, wheezing or  rhonchi  ABDOMEN: Soft, non-tender, non-distended MUSCULOSKELETAL:  No edema; No deformity  SKIN: Warm and dry NEUROLOGIC:  Alert and oriented x 3 PSYCHIATRIC:  Normal affect   ASSESSMENT:    Persistent Atrial Fibrillation. TSH is normal. Normal EF. No structural dx. - s/p DCCV  with restoration to NSR 09/09/2022; she had recurrence. -She is status post atrial fibrillation ablation on 02/16/2023 -On Eliquis 5 mg BID -On metoprolol XL 12.5 mg daily  HTN -Continue antihypertensives as above  Thoracic aortic aneurysm/prox descending - 5.6 cm. hypoattenuating noncalcified thrombus. Chronic Type B aortic dissection.  Stable.  Evaluated by Dr. Edilia Bo,  now sees Dr. Karin Lieu.  LDL 64 mg/dL at goal  PLAN:    In order of problems listed above:  No changes Follow-up in 1 year      Medication Adjustments/Labs and Tests Ordered: Current medicines are reviewed at length with the patient today.  Concerns regarding medicines are outlined above.  No orders of the defined types were placed in this encounter.  No orders of the defined types were placed in this encounter.   There are no Patient Instructions on file for this visit.   Signed, [name], MD  03/30/2023 10:20 AM    [facility]

## 2023-05-19 ENCOUNTER — Emergency Department (HOSPITAL_COMMUNITY): Payer: Medicare Other

## 2023-05-19 ENCOUNTER — Encounter (HOSPITAL_COMMUNITY): Payer: Self-pay | Admitting: Emergency Medicine

## 2023-05-19 ENCOUNTER — Other Ambulatory Visit: Payer: Self-pay

## 2023-05-19 ENCOUNTER — Ambulatory Visit: Payer: Medicare Other | Admitting: Cardiovascular Disease

## 2023-05-19 ENCOUNTER — Emergency Department (HOSPITAL_COMMUNITY)
Admission: EM | Admit: 2023-05-19 | Discharge: 2023-05-20 | Disposition: A | Discharge status: Home / Self Care | Payer: Medicare Other | Attending: Emergency Medicine | Admitting: Emergency Medicine

## 2023-05-19 DIAGNOSIS — I471 Supraventricular tachycardia, unspecified: Secondary | ICD-10-CM | POA: Diagnosis not present

## 2023-05-19 DIAGNOSIS — I10 Essential (primary) hypertension: Secondary | ICD-10-CM | POA: Diagnosis not present

## 2023-05-19 DIAGNOSIS — R079 Chest pain, unspecified: Secondary | ICD-10-CM | POA: Diagnosis not present

## 2023-05-19 DIAGNOSIS — R002 Palpitations: Secondary | ICD-10-CM | POA: Diagnosis not present

## 2023-05-19 DIAGNOSIS — I712 Thoracic aortic aneurysm, without rupture, unspecified: Secondary | ICD-10-CM | POA: Diagnosis not present

## 2023-05-19 DIAGNOSIS — Z7901 Long term (current) use of anticoagulants: Secondary | ICD-10-CM | POA: Diagnosis not present

## 2023-05-19 DIAGNOSIS — I4430 Unspecified atrioventricular block: Secondary | ICD-10-CM | POA: Diagnosis not present

## 2023-05-19 DIAGNOSIS — R000 Tachycardia, unspecified: Secondary | ICD-10-CM | POA: Diagnosis not present

## 2023-05-19 LAB — BASIC METABOLIC PANEL
Anion gap: 13 (ref 5–15)
BUN: 21 mg/dL (ref 8–23)
CO2: 18 mmol/L — ABNORMAL LOW (ref 22–32)
Calcium: 9.1 mg/dL (ref 8.9–10.3)
Chloride: 110 mmol/L (ref 98–111)
Creatinine, Ser: 0.71 mg/dL (ref 0.44–1.00)
GFR, Estimated: >60 mL/min (ref >60)
Glucose, Bld: 104 mg/dL — ABNORMAL HIGH (ref 70–99)
Potassium: 4.2 mmol/L (ref 3.5–5.1)
Sodium: 141 mmol/L (ref 135–145)

## 2023-05-19 LAB — CBC
HCT: 44.7 % (ref 36.0–46.0)
Hemoglobin: 14.9 g/dL (ref 12.0–15.0)
MCH: 30.5 pg (ref 26.0–34.0)
MCHC: 33.3 g/dL (ref 30.0–36.0)
MCV: 91.4 fL (ref 80.0–100.0)
Platelets: 281 10*3/uL (ref 150–400)
RBC: 4.89 MIL/uL (ref 3.87–5.11)
RDW: 14.1 % (ref 11.5–15.5)
WBC: 9.6 10*3/uL (ref 4.0–10.5)
nRBC: 0 % (ref 0.0–0.2)

## 2023-05-19 NOTE — Discharge Instructions (Addendum)
 You have been evaluated for your symptoms.  Fortunately your blood work overall reassuring.  Please call and follow-up closely with your A-fib clinic for outpatient management of your condition.  Return if you have any concern.

## 2023-05-19 NOTE — ED Provider Notes (Signed)
 [place] EMERGENCY DEPARTMENT AT [facility] Provider Note   CSN: [id number] Arrival date & time: 05/19/23  2004     History  Chief Complaint  Patient presents with   SVT    Terri Alexander is a 73 y.o. female.  The history is provided by the patient, the EMS personnel and medical records. No language interpreter was used.     73 year old female with history of A-fib currently on Eliquis , brought here via EMS from home with complaints of heart palpitation.  Patient report this afternoon she was at her son's house babysitting his dog when she reached down to pick up an object and felt her heart racing.  She checked her watch and states that it was running quite fast.  She waits for a while and then went home to check her blood pressure and states her blood pressure was high and heart rate persistently high.  She did endorse some mild lightheadedness during that time but no significant chest pain or shortness of breath.  EMS was called and patient received 6 mg of adenosine and her heart rate converted shortly after.  Per EMS, patient initially did have a heart rate in the 150s and was documented as SVT.  Patient mention she has been compliant with her medication including her Eliquis .  She denies any environmental changes leading up to this event.  She has been eating and drinking fine no nausea vomiting diarrhea no recent sickness.  She did have some cold symptoms 2 weeks ago but that has since resolved.  Home Medications Prior to Admission medications   Medication Sig Start Date End Date Taking? Authorizing Provider  acetaminophen  (TYLENOL ) 500 MG tablet Take 1,000 mg by mouth every 6 (six) hours as needed for moderate pain.    [provider]  alendronate  (FOSAMAX ) 70 MG tablet TAKE 1 TABLET BY MOUTH ONCE A WEEK ON AN EMPTY STOMACH WITH A FULL GLASS OF WATER Patient taking differently: Take 70 mg by mouth every Friday. 06/28/22   [name], MD  apixaban   (ELIQUIS ) 5 MG TABS tablet Take 1 tablet (5 mg total) by mouth 2 (two) times daily. 03/18/23   Terra Fairy JINNY, PA-C  Ascorbic Acid (VITAMIN C) 1000 MG tablet Take 1,000 mg by mouth in the morning.    [provider]  Biotin 1000 MCG tablet Take 1,000 mcg by mouth daily.    [provider]  Calcium Carbonate-Vit D-Min (CALCIUM 1200 PO) Take 1 tablet by mouth in the morning.    [provider]  Calcium Polycarbophil (FIBER-CAPS PO) Take 1 capsule by mouth in the morning.    [provider]  cetirizine (ZYRTEC) 10 MG tablet Take 10 mg by mouth at bedtime.    [provider]  Cholecalciferol (VITAMIN D3) 125 MCG (5000 UT) TABS Take 5,000 Units by mouth in the morning.    [provider]  Cranberry 500 MG CHEW Chew 500 mg by mouth in the morning.    [provider]  docusate sodium  (COLACE) 100 MG capsule Take 100 mg by mouth in the morning.    [provider]  famotidine  (PEPCID ) 20 MG tablet Take 20 mg by mouth daily as needed for heartburn or indigestion.    [provider]  furosemide  (LASIX ) 40 MG tablet Take 1 tablet (40 mg total) by mouth daily. Patient taking differently: Take 40 mg by mouth as needed for edema. 08/09/22   [name], MD  L-Lysine  500 MG CAPS Take 500 mg by mouth in the morning.    [provider]  Lidocaine  (SALONPAS PAIN RELIEVING EX) Apply 1 Application topically daily as needed (pain). Patient not taking: Reported on 03/30/2023    [provider]  Menthol-Methyl Salicylate (SALONPAS PAIN RELIEF PATCH EX) Apply 1 patch topically daily as needed (pain). Patient not taking: Reported on 03/30/2023    [provider]  metoprolol  succinate (TOPROL -XL) 25 MG 24 hr tablet Take 1 tablet (25 mg total) by mouth daily. Take with or immediately following a meal. Patient taking differently: Take 12.5 mg by mouth in the morning. Take with or immediately following a meal.  10/14/22   Terra Fairy PARAS, PA-C  Multiple Vitamin (MULTIVITAMIN WITH MINERALS) TABS tablet Take 1 tablet by mouth in the morning.    [provider]  omeprazole  (PRILOSEC  OTC) 20 MG tablet Take 20 mg by mouth daily as needed (acid reflux).    [provider]  Probiotic Product (PROBIOTIC PO) Take 1 tablet by mouth in the morning.    [provider]      Allergies    Patient has no known allergies.    Review of Systems   Review of Systems  All other systems reviewed and are negative.   Physical Exam Updated Vital Signs BP (!) 142/67   Pulse 78   Temp 98.3 F (36.8 C)   Resp 18   Ht 5' 2 (1.575 m)   Wt 59.9 kg   LMP  (LMP Unknown)   SpO2 100%   BMI 24.14 kg/m  Physical Exam Vitals and nursing note reviewed.  Constitutional:      General: She is not in acute distress.    Appearance: She is well-developed.  HENT:     Head: Atraumatic.  Eyes:     Conjunctiva/sclera: Conjunctivae normal.  Cardiovascular:     Rate and Rhythm: Normal rate and regular rhythm.     Pulses: Normal pulses.     Heart sounds: Normal heart sounds.  Pulmonary:     Effort: Pulmonary effort is normal.  Abdominal:     Palpations: Abdomen is soft.     Tenderness: There is no abdominal tenderness.  Musculoskeletal:     Cervical back: Neck supple.     Right lower leg: No edema.     Left lower leg: No edema.  Skin:    Findings: No rash.  Neurological:     Mental Status: She is alert. Mental status is at baseline.  Psychiatric:        Mood and Affect: Mood normal.     ED Results / Procedures / Treatments   Labs (all labs ordered are listed, but only abnormal results are displayed) Labs Reviewed  BASIC METABOLIC PANEL - Abnormal; Notable for the following components:      Result Value   CO2 18 (*)    Glucose, Bld 104 (*)    All other components within normal limits  CBC    EKG None  ED ECG REPORT   Date: 05/19/2023  Rate: 74  Rhythm: normal sinus rhythm   QRS Axis: normal  Intervals: PR prolonged  ST/T Wave abnormalities: nonspecific T wave changes  Conduction Disutrbances:none  Narrative Interpretation:   Old EKG Reviewed: unchanged  I have personally reviewed the EKG tracing and agree with the computerized printout as noted.   Radiology DG Chest Port 1 View Result Date: 05/19/2023 CLINICAL DATA:  Heart palpitation EXAM: PORTABLE CHEST 1 VIEW COMPARISON:  Chest CT 02/14/2023 FINDINGS: No acute airspace disease or pleural effusion. Aneurysmal dilatation of the aortic knob, corresponding to the finding seen on previous CT. Normal cardiac size. No pneumothorax IMPRESSION: No active disease. Aneurysmal dilatation of the aortic knob, corresponding previous CT demonstrated chronic type B dissection and aneurysmal dilatation of the thoracic aorta. Electronically Signed   By: Luke Bun M.D.   On: 05/19/2023 22:29    Procedures Procedures    Medications Ordered in ED Medications - No data to display  ED Course/ Medical Decision Making/ A&P                                 Medical Decision Making Amount and/or Complexity of Data Reviewed Labs: ordered. Radiology: ordered.   BP (!) 142/67   Pulse 78   Temp 98.3 F (36.8 C)   Resp 18   Ht 5' 2 (1.575 m)   Wt 59.9 kg   LMP  (LMP Unknown)   SpO2 100%   BMI 24.14 kg/m   5:62 PM  73 year old female with history of A-fib currently on Eliquis , brought here via EMS from home with complaints of heart palpitation.  Patient report this afternoon she was at her son's house babysitting his dog when she reached down to pick up an object and felt her heart racing.  She checked her watch and states that it was running quite fast.  She waits for a while and then went home to check her blood pressure and states her blood pressure was high and heart rate persistently high.  She did endorse some mild lightheadedness during that time but no significant chest pain or shortness of breath.  EMS was  called and patient received 6 mg of adenosine and her heart rate converted shortly after.  Per EMS, patient initially did have a heart rate in the 150s and was documented as SVT.  Patient mention she has been compliant with her medication including her Eliquis .  She denies any environmental changes leading up to this event.  She has been eating and drinking fine no nausea vomiting diarrhea no recent sickness.  She did have some cold symptoms 2 weeks ago but that has since resolved.  Exam overall reassuring, heart with normal rate and rhythm, lungs are clear to auscultation bilaterally abdomen is soft nontender she has intact distal pulses, no evidence of peripheral edema.  She is mentating appropriately.  Vital sign overall reassuring, no tachycardia, no fever no hypoxia  -Labs ordered, independently viewed and interpreted by me.  Labs remarkable for electrolytes are reassuring.  No anemia -The patient was maintained on a cardiac monitor.  I personally viewed and interpreted the cardiac monitored which showed an underlying rhythm of: NSR -Imaging independently viewed and interpreted by me and I agree with radiologist's interpretation.  Result remarkable for CXR without active disease -This patient presents to the ED for concern of heart palpitation, this involves an extensive number of treatment options, and is a complaint that carries with it a high risk of complications and morbidity.  The differential diagnosis includes afib, SVT, cardiac arrhythmia, anemia, hypovolemia, vasovagal -Co morbidities that complicate the patient evaluation includes afib -Treatment includes adenosine -Reevaluation of the patient after these medicines showed that the patient improved -PCP office notes or outside notes reviewed -Discussion with attending Dr. Francesca -Escalation to admission/observation considered: patients feels much better, is comfortable with discharge, and will follow up with cardiology -Prescription  medication considered, patient comfortable with home medication -Social Determinant of Health considered         Final Clinical Impression(s) / ED Diagnoses Final diagnoses:  SVT (supraventricular tachycardia) [facility])    Rx / DC Orders ED Discharge Orders     None         Nivia Colon, PA-C 05/19/23 2312    Francesca Elsie CROME, MD 05/20/23 [phone]

## 2023-05-19 NOTE — ED Triage Notes (Signed)
 Pt BIB EMS from home with c/o palpitations, exertional dyspnea, and chest tightness since 1700. HR was initially in the 150s in SVT, pt given 6mg  adenosine and converted.  6mg  adenosine

## 2023-05-23 ENCOUNTER — Ambulatory Visit (HOSPITAL_COMMUNITY): Payer: Medicare Other | Admitting: Internal Medicine

## 2023-05-24 ENCOUNTER — Ambulatory Visit (HOSPITAL_COMMUNITY)
Admission: RE | Admit: 2023-05-24 | Discharge: 2023-05-24 | Disposition: A | Discharge status: Home / Self Care | Payer: Medicare Other | Source: Ambulatory Visit | Attending: Internal Medicine | Admitting: Internal Medicine

## 2023-05-24 VITALS — BP 148/84 | HR 63 | Ht 62.0 in | Wt 135.0 lb

## 2023-05-24 DIAGNOSIS — E785 Hyperlipidemia, unspecified: Secondary | ICD-10-CM | POA: Diagnosis not present

## 2023-05-24 DIAGNOSIS — I4819 Other persistent atrial fibrillation: Secondary | ICD-10-CM | POA: Diagnosis not present

## 2023-05-24 DIAGNOSIS — I4891 Unspecified atrial fibrillation: Secondary | ICD-10-CM

## 2023-05-24 DIAGNOSIS — D6869 Other thrombophilia: Secondary | ICD-10-CM | POA: Diagnosis not present

## 2023-05-24 DIAGNOSIS — K219 Gastro-esophageal reflux disease without esophagitis: Secondary | ICD-10-CM | POA: Diagnosis not present

## 2023-05-24 DIAGNOSIS — Z7901 Long term (current) use of anticoagulants: Secondary | ICD-10-CM | POA: Diagnosis not present

## 2023-05-24 MED ORDER — METOPROLOL SUCCINATE ER 25 MG PO TB24: 25.0000 mg | ORAL_TABLET | Freq: Every day | ORAL | Status: DC

## 2023-05-24 NOTE — Progress Notes (Signed)
 Primary Care Physician: Terri Butler DASEN, MD Primary Cardiologist: Terri Alexander Primary Electrophysiologist: None Referring Physician:    [name] is a 73 y.o. female with a history of HLD, GERD, descending thoracic aortic aneurysm versus chronic type B aortic dissection, significant laminated thrombus within aneurysm, and persistent atrial fibrillation who presents for consultation in the [facility]. The patient was initially diagnosed with atrial fibrillation on 3/25 by PCP and was started on Eliquis  5 mg BID. Patient admits to not starting anticoagulation at that time yet. She saw PCP on f/u 3/28 and Toprol  was increased to 100 mg daily. She presented to the ED at [facility] on 4/2 with chest pain and shortness of breath. Review of records show she took two full doses of Eliquis  on 4/3. She did not have surgery for vascular finding, believed to be chronic, and has follow up with them outpatient for repeat CT. She was discharged on diltiazem  60 mg BID and Lopressor  25 mg BID. Patient is on Eliquis  5 mg BID for a CHADS2VASC score of 3.  On evaluation 08/25/22, she is currently in Afib. She states she does not really feel it at this time. She states it is not like when she had chest pain and shortness of breath on day of ED visit. She is transitioning to Terri Alexander for new cardiologist. She feels tired when she does activity but otherwise in office feels okay. She does not drink alcohol. She does not snore or stop breathing per husband.   Mother and aunt with history of Afib.  On follow up 09/20/22, she is currently in Afib. She is s/p successful DCCV on 4/25 with conversion to NSR x 2 shocks. Saw Terri Alexander on 5/2 and was back in Afib. Patient states she was in normal rhythm for about a day and a half. After discussion with Terri Alexander, patient was considering Tikosyn load. Metoprolol  changed to Toprol  50 mg daily. Since office visit, she has felt mild chest pressure  over the weekend but has resolved. She doesn't really feel tired while in Afib. She has questions about Tikosyn and also has questions about ablation procedure.  On follow up 03/18/23, she is currently in NSR. S/p Afib ablation on 02/16/23 by Terri. Nancey. No episodes of Afib since ablation. She feels well overall. No chest pain, SOB, or trouble swallowing. Leg sites healed without issue. No missed doses of anticoagulant.   On follow up 05/24/23, she is currently in NSR. Recent ED visit on 05/19/23 for reported SVT by EMS. She noted sudden onset of palpitations when she bent over to clean up spilled dog food. The ECG in media tab appears to show SVT; notes show she converted to NSR with 6 mg adenosine and was in SR in ED. She is now 3 months post ablation. She is currently on Toprol  12.5 mg daily. No missed doses of Eliquis  5 mg BID.     Today, she denies symptoms of orthopnea, PND, lower extremity edema, dizziness, presyncope, syncope, snoring, daytime somnolence, bleeding, or neurologic sequela. The patient is tolerating medications without difficulties and is otherwise without complaint today.    Atrial Fibrillation Risk Factors:  she does not have symptoms or diagnosis of sleep apnea. she does not have a history of rheumatic fever. she does not have a history of alcohol use. The patient does have a history of early familial atrial fibrillation or other arrhythmias.  she has a BMI of Body mass index is 24.69  kg/m.. Filed Weights   05/24/23 0952  Weight: 61.2 kg     Family History  Problem Relation Age of Onset   Arthritis Mother    Heart disease Mother    Vision loss Mother    Stroke Mother    Depression Sister    Transient ischemic attack Sister 61   Depression Sister    Cancer Maternal Grandmother    Cancer Maternal Grandfather    Early death Maternal Grandfather    Heart disease Maternal Grandfather    Early death Paternal Grandfather    Heart disease Paternal Grandfather     Diabetes Other        Juvenile   Colon cancer Neg Hx    Rectal cancer Neg Hx    Prostate cancer Neg Hx      Atrial Fibrillation Management history:  Previous antiarrhythmic drugs: amiodarone  Previous cardioversions: 09/09/22 Previous ablations: 02/16/23 Anticoagulation history: Eliquis  5 mg BID    Past Medical History:  Diagnosis Date   Adenomatous colon polyp    Allergy    Anemia    as a child, no current problem   Atrial fibrillation (HCC)    Blood transfusion without reported diagnosis    33 months old   Cataract    bilateral - MD just watching    GERD (gastroesophageal reflux disease)    Hyperlipidemia    Osteoporosis 04/15/2016   Skin cancer 2013   Skin cancer- Nose, face   Past Surgical History:  Procedure Laterality Date   ATRIAL FIBRILLATION ABLATION N/A 02/16/2023   Procedure: ATRIAL FIBRILLATION ABLATION;  Surgeon: Terri Terri Alexander BRAVO, MD;  Location: [facility] INVASIVE CV [facility];  Service: Cardiovascular;  Laterality: N/A;   CARDIOVERSION N/A 09/09/2022   Procedure: CARDIOVERSION;  Surgeon: Terri Alexander Terri Alexander BRAVO, MD;  Location: [facility] INVAS[facility] CV LAB;  Service: Cardiovascular;  Laterality: N/A;   CESAREAN SECTION  1981   x 1   COLONOSCOPY  2016   hx polyp, tics, sigmoid colon mod tortuous Terri Alexander,    EYE SURGERY Bilateral    retina repair x 2   LAPAROTOMY  1979   removal of ovarian cyst   nissan fundiplication  05/17/1997   REPAIR OF COMPLEX TRACTION RETINAL DETACHMENT Bilateral 01/2019    Current Outpatient Medications  Medication Sig Dispense Refill   acetaminophen  (TYLENOL ) 500 MG tablet Take 1,000 mg by mouth as needed for moderate pain (pain score 4-6).     alendronate  (FOSAMAX ) 70 MG tablet TAKE 1 TABLET BY MOUTH ONCE A WEEK ON AN EMPTY STOMACH WITH A FULL GLASS OF WATER (Patient taking differently: Take 70 mg by mouth every Friday.) 12 tablet 3   apixaban  (ELIQUIS ) 5 MG TABS tablet Take 1 tablet (5 mg total) by mouth 2 (two) times daily. 180 tablet 2   Ascorbic Acid  (VITAMIN C) 1000 MG tablet Take 1,000 mg by mouth in the morning.     Biotin 1000 MCG tablet Take 1,000 mcg by mouth daily.     Calcium Carbonate-Vit D-Min (CALCIUM 1200 PO) Take 1 tablet by mouth in the morning.     Calcium Polycarbophil (FIBER-CAPS PO) Take 1 capsule by mouth in the morning.     cetirizine (ZYRTEC) 10 MG tablet Take 10 mg by mouth at bedtime.     Cholecalciferol (VITAMIN D3) 125 MCG (5000 UT) TABS Take 5,000 Units by mouth in the morning.     Cranberry 500 MG CHEW Chew 500 mg by mouth in the morning.     docusate sodium  (  COLACE) 100 MG capsule Take 100 mg by mouth in the morning.     famotidine  (PEPCID ) 20 MG tablet Take 20 mg by mouth daily as needed for heartburn or indigestion.     furosemide  (LASIX ) 40 MG tablet Take 1 tablet (40 mg total) by mouth daily. (Patient taking differently: Take 40 mg by mouth as needed for edema.) 30 tablet 3   L-Lysine 500 MG CAPS Take 500 mg by mouth in the morning.     Lidocaine  (SALONPAS PAIN RELIEVING EX) Apply 1 Application topically daily as needed (pain).     Menthol-Methyl Salicylate (SALONPAS PAIN RELIEF PATCH EX) Apply 1 patch topically daily as needed (pain).     metoprolol  succinate (TOPROL -XL) 25 MG 24 hr tablet Take 1 tablet (25 mg total) by mouth daily. Take with or immediately following a meal. (Patient taking differently: Take 12.5 mg by mouth in the morning. Take with or immediately following a meal.) 90 tablet 1   Multiple Vitamin (MULTIVITAMIN WITH MINERALS) TABS tablet Take 1 tablet by mouth in the morning.     omeprazole  (PRILOSEC  OTC) 20 MG tablet Take 20 mg by mouth daily as needed (acid reflux).     Probiotic Product (PROBIOTIC PO) Take 1 tablet by mouth in the morning.     No current facility-administered medications for this encounter.    No Known Allergies  ROS- All systems are reviewed and negative except as per the HPI above.  Physical Exam: Vitals:   05/24/23 0952  BP: (!) 148/84  Pulse: 63  Weight: 61.2  kg  Height: 5' 2 (1.575 m)     GEN- The patient is well appearing, alert and oriented x 3 today.   Neck - no JVD or carotid bruit noted Lungs- Clear to ausculation bilaterally, normal work of breathing Heart- Regular rate and rhythm, no murmurs, rubs or gallops, PMI not laterally displaced Extremities- no clubbing, cyanosis, or edema Skin - no rash or ecchymosis noted  Wt Readings from Last 3 Encounters:  05/24/23 61.2 kg  05/19/23 59.9 kg  03/30/23 61.3 kg    ECG today demonstrates  Vent. rate 63 BPM PR interval 208 ms QRS duration 88 ms QT/QTcB 416/425 ms P-R-T axes 73 39 59 Normal sinus rhythm Anterior infarct , age undetermined Abnormal ECG When compared with ECG of 19-May-2023 20:13, PREVIOUS ECG IS PRESENT  Echo 08/18/22 demonstrated:  1. Left ventricular ejection fraction, by estimation, is 60 to 65%. The  left ventricle has normal function. The left ventricle has no regional  wall motion abnormalities. Left ventricular diastolic function could not  be evaluated.   2. Right ventricular systolic function is normal. The right ventricular  size is normal. There is normal pulmonary artery systolic pressure. The  estimated right ventricular systolic pressure is 26.5 mmHg.   3. Left atrial size was mild to moderately dilated.   4. Right atrial size was mildly dilated.   5. The mitral valve is normal in structure. Mild mitral valve  regurgitation. No evidence of mitral stenosis.   6. Tricuspid valve regurgitation is mild to moderate.   7. The aortic valve is tricuspid. Aortic valve regurgitation is not  visualized. No aortic stenosis is present.   8. The inferior vena cava is dilated in size with >50% respiratory  variability, suggesting right atrial pressure of 8 mmHg.   Epic records are reviewed at length today.  CHA2DS2-VASc Score = 3  The patient's score is based upon: CHF History: 0 HTN History: 0 Diabetes  History: 0 Stroke History: 0 Vascular Disease  History: 1 Age Score: 1 Gender Score: 1       ASSESSMENT AND PLAN: Persistent Atrial Fibrillation (ICD10:  I48.19) The patient's CHA2DS2-VASc score is 3, indicating a 3.2% annual risk of stroke.   S/p successful DCCV on 09/09/22. S/p Afib ablation on 02/16/23 by Terri. Nancey.  She is currently in NSR.  Due to recent episode of SVT, will try increase Toprol  to 25 mg daily (take in evening). Monitor HR and BP.    2. Secondary Hypercoagulable State (ICD10:  D68.69) The patient is at significant risk for stroke/thromboembolism based upon her CHA2DS2-VASc Score of 3.  Continue Apixaban  (Eliquis ).   Education on compliance provided. No missed doses.   Follow up 6 months with Terri. Nancey.    Terri Heinrich, PA-C Afib Clinic [facility] [address] [place], [place] [address] [phone] 05/24/2023 10:22 AM

## 2023-05-24 NOTE — Patient Instructions (Signed)
 Please take Toprol 25 mg daily (1 tablet).

## 2023-05-27 ENCOUNTER — Ambulatory Visit: Payer: Medicare Other | Admitting: Student

## 2023-06-01 DIAGNOSIS — H3582 Retinal ischemia: Secondary | ICD-10-CM | POA: Diagnosis not present

## 2023-06-14 ENCOUNTER — Encounter: Payer: Self-pay | Admitting: Family Medicine

## 2023-06-14 ENCOUNTER — Ambulatory Visit (INDEPENDENT_AMBULATORY_CARE_PROVIDER_SITE_OTHER): Payer: Medicare Other | Admitting: Family Medicine

## 2023-06-14 VITALS — BP 126/72 | HR 62 | Ht 62.0 in | Wt 133.2 lb

## 2023-06-14 DIAGNOSIS — M62838 Other muscle spasm: Secondary | ICD-10-CM

## 2023-06-14 MED ORDER — CYCLOBENZAPRINE HCL 10 MG PO TABS: 10.0000 mg | ORAL_TABLET | Freq: Three times a day (TID) | ORAL | 0 refills | Status: DC | PRN

## 2023-06-14 NOTE — Progress Notes (Signed)
Subjective:    Patient ID: Terri Alexander, female    DOB: [birth year], 73 y.o.   MRN: [id number]  Patient states that 1 week ago, she bent over and felt something pull in her right flank.  For the last week, she has been having muscle spasms almost like cramps in her right flank and right mid back.  She denies any chest pain.  She denies any shortness of breath.  She denies any dyspnea on exertion.  She denies any pleurisy.  She denies any cough.  She denies any hematuria or dysuria.  She is tender to palpation of the right side of the neck and the right mid back Past Medical History:  Diagnosis Date   Adenomatous colon polyp    Allergy    Anemia    as a child, no current problem   Atrial fibrillation (HCC)    Blood transfusion without reported diagnosis    14 months old   Cataract    bilateral - MD just watching    GERD (gastroesophageal reflux disease)    Hyperlipidemia    Osteoporosis 04/15/2016   Skin cancer 2013   Skin cancer- Nose, face   Current Outpatient Medications on File Prior to Visit  Medication Sig Dispense Refill   acetaminophen (TYLENOL) 500 MG tablet Take 1,000 mg by mouth as needed for moderate pain (pain score 4-6).     alendronate (FOSAMAX) 70 MG tablet TAKE 1 TABLET BY MOUTH ONCE A WEEK ON AN EMPTY STOMACH WITH A FULL GLASS OF WATER (Patient taking differently: Take 70 mg by mouth every Friday.) 12 tablet 3   apixaban (ELIQUIS) 5 MG TABS tablet Take 1 tablet (5 mg total) by mouth 2 (two) times daily. 180 tablet 2   Ascorbic Acid (VITAMIN C) 1000 MG tablet Take 1,000 mg by mouth in the morning.     Biotin 1000 MCG tablet Take 1,000 mcg by mouth daily.     Calcium Carbonate-Vit D-Min (CALCIUM 1200 PO) Take 1 tablet by mouth in the morning.     Calcium Polycarbophil (FIBER-CAPS PO) Take 1 capsule by mouth in the morning.     cetirizine (ZYRTEC) 10 MG tablet Take 10 mg by mouth at bedtime.     Cholecalciferol (VITAMIN D3) 125 MCG (5000 UT) TABS Take 5,000 Units by  mouth in the morning.     Cranberry 500 MG CHEW Chew 500 mg by mouth in the morning.     docusate sodium (COLACE) 100 MG capsule Take 100 mg by mouth in the morning.     famotidine (PEPCID) 20 MG tablet Take 20 mg by mouth daily as needed for heartburn or indigestion.     furosemide (LASIX) 40 MG tablet Take 1 tablet (40 mg total) by mouth daily. (Patient taking differently: Take 40 mg by mouth as needed for edema.) 30 tablet 3   L-Lysine 500 MG CAPS Take 500 mg by mouth in the morning.     Lidocaine (SALONPAS PAIN RELIEVING EX) Apply 1 Application topically daily as needed (pain).     Menthol-Methyl Salicylate (SALONPAS PAIN RELIEF PATCH EX) Apply 1 patch topically daily as needed (pain).     metoprolol succinate (TOPROL-XL) 25 MG 24 hr tablet Take 1 tablet (25 mg total) by mouth daily. Take with or immediately following a meal.     Multiple Vitamin (MULTIVITAMIN WITH MINERALS) TABS tablet Take 1 tablet by mouth in the morning.     omeprazole (PRILOSEC OTC) 20 MG tablet Take 20 mg  by mouth daily as needed (acid reflux).     Probiotic Product (PROBIOTIC PO) Take 1 tablet by mouth in the morning.     No current facility-administered medications on file prior to visit.   No Known Allergies Social History   Socioeconomic History   Marital status: Married    Spouse name: Not on file   Number of children: Not on file   Years of education: Not on file   Highest education level: 12th grade  Occupational History   Not on file  Tobacco Use   Smoking status: Never   Smokeless tobacco: Never  Vaping Use   Vaping status: Never Used  Substance and Sexual Activity   Alcohol use: Yes    Alcohol/week: 1.0 standard drink of alcohol    Types: 1 Glasses of wine per week    Comment: rarely   Drug use: No   Sexual activity: Not Currently    Birth control/protection: Post-menopausal  Other Topics Concern   Not on file  Social History Narrative   Not on file   Social Drivers of Health    Financial Resource Strain: Low Risk  (06/09/2023)   Overall Financial Resource Strain (CARDIA)    Difficulty of Paying Living Expenses: Not hard at all  Food Insecurity: No Food Insecurity (06/09/2023)   Hunger Vital Sign    Worried About Running Out of Food in the Last Year: Never true    Ran Out of Food in the Last Year: Never true  Transportation Needs: No Transportation Needs (06/09/2023)   PRAPARE - Administrator, Civil Service (Medical): No    Lack of Transportation (Non-Medical): No  Physical Activity: Insufficiently Active (06/09/2023)   Exercise Vital Sign    Days of Exercise per Week: 4 days    Minutes of Exercise per Session: 20 min  Stress: No Stress Concern Present (06/09/2023)   [employer] of Occupational Health - Occupational Stress Questionnaire    Feeling of Stress : Not at all  Social Connections: Moderately Integrated (06/09/2023)   Social Connection and Isolation Panel [NHANES]    Frequency of Communication with Friends and Family: Twice a week    Frequency of Social Gatherings with Friends and Family: Once a week    Attends Religious Services: 1 to 4 times per year    Active Member of [employer] or Organizations: No    Attends Engineer, structural: Not on file    Marital Status: Married  Catering manager Violence: Not on file      Review of Systems  All other systems reviewed and are negative.      Objective:   Physical Exam Vitals reviewed.  Constitutional:      General: She is not in acute distress.    Appearance: Normal appearance. She is not ill-appearing, toxic-appearing or diaphoretic.  HENT:     Head: Normocephalic and atraumatic.  Neck:     Vascular: No carotid bruit.  Cardiovascular:     Rate and Rhythm: Tachycardia present. Rhythm irregular.     Pulses: Normal pulses.     Heart sounds: Normal heart sounds. No murmur heard.    No friction rub. No gallop.  Pulmonary:     Effort: Pulmonary effort is normal. No  respiratory distress.     Breath sounds: No stridor. No wheezing, rhonchi or rales.  Chest:     Chest wall: No tenderness.  Abdominal:     General: Abdomen is flat. Bowel sounds are normal. There is  no distension.     Palpations: Abdomen is soft. There is no mass.     Tenderness: There is no abdominal tenderness. There is no guarding or rebound.     Hernia: No hernia is present.  Musculoskeletal:     Cervical back: Normal range of motion and neck supple. Tenderness present.     Thoracic back: Spasms and tenderness present. No bony tenderness. Normal range of motion.       Back:     Right lower leg: Edema present.     Left lower leg: Edema present.  Lymphadenopathy:     Cervical: No cervical adenopathy.  Skin:    General: Skin is warm.     Coloration: Skin is not jaundiced or pale.     Findings: No bruising, erythema, lesion or rash.  Neurological:     General: No focal deficit present.     Mental Status: She is alert and oriented to person, place, and time. Mental status is at baseline.     Cranial Nerves: No cranial nerve deficit.     Sensory: No sensory deficit.     Motor: No weakness.     Coordination: Coordination normal.     Gait: Gait normal.     Deep Tendon Reflexes: Reflexes normal.  Psychiatric:        Mood and Affect: Mood normal.        Behavior: Behavior normal.        Thought Content: Thought content normal.        Judgment: Judgment normal.           Assessment & Plan:  Muscle spasm Symptoms consistent with a strain of latissimus dorsi with muscle spasms.  Recommended gentle stretching, heat, and Flexeril 5 to 10 mg every 8 hours as needed for muscle spasms.  Anticipate limited resolution over the next 7 to 10 days.

## 2023-06-20 ENCOUNTER — Other Ambulatory Visit (HOSPITAL_COMMUNITY): Payer: Self-pay

## 2023-06-20 MED ORDER — APIXABAN 5 MG PO TABS: 5.0000 mg | ORAL_TABLET | Freq: Two times a day (BID) | ORAL | 2 refills | Status: DC

## 2023-06-20 MED ORDER — METOPROLOL SUCCINATE ER 25 MG PO TB24: 25.0000 mg | ORAL_TABLET | Freq: Every day | ORAL | 2 refills | Status: DC

## 2023-06-20 NOTE — Addendum Note (Signed)
Addended by: Hyman Bower F on: 06/20/2023 01:21 PM   Modules accepted: Orders

## 2023-06-20 NOTE — Addendum Note (Signed)
Addended by: Hyman Bower F on: 06/20/2023 01:23 PM   Modules accepted: Orders

## 2023-06-27 ENCOUNTER — Other Ambulatory Visit: Payer: Self-pay | Admitting: Family Medicine

## 2023-06-27 DIAGNOSIS — Z1231 Encounter for screening mammogram for malignant neoplasm of breast: Secondary | ICD-10-CM

## 2023-06-30 ENCOUNTER — Ambulatory Visit: Payer: Medicare Other | Admitting: Family Medicine

## 2023-06-30 ENCOUNTER — Ambulatory Visit (INDEPENDENT_AMBULATORY_CARE_PROVIDER_SITE_OTHER): Payer: Medicare Other | Admitting: *Deleted

## 2023-06-30 ENCOUNTER — Telehealth (HOSPITAL_COMMUNITY): Payer: Self-pay | Admitting: *Deleted

## 2023-06-30 DIAGNOSIS — Z0000 Encounter for general adult medical examination without abnormal findings: Secondary | ICD-10-CM

## 2023-06-30 DIAGNOSIS — M810 Age-related osteoporosis without current pathological fracture: Secondary | ICD-10-CM | POA: Diagnosis not present

## 2023-06-30 DIAGNOSIS — Z0001 Encounter for general adult medical examination with abnormal findings: Secondary | ICD-10-CM

## 2023-06-30 MED ORDER — DABIGATRAN ETEXILATE MESYLATE 150 MG PO CAPS: 150.0000 mg | ORAL_CAPSULE | Freq: Two times a day (BID) | ORAL | 2 refills | Status: DC

## 2023-06-30 NOTE — Telephone Encounter (Signed)
Pt requesting switch of Eliquis to Pradaxa due to costs.  Discussed with Landry Mellow PA will stop eliquis and start Pradaxa 150mg  BID Crcl 67 ml/min.

## 2023-06-30 NOTE — Patient Instructions (Signed)
[name] , Thank you for taking time to come for your Medicare Wellness Visit. I appreciate your ongoing commitment to your health goals. Please review the following plan we discussed and let me know if I can assist you in the future.   Screening recommendations/referrals: Colonoscopy: no longer required Mammogram: scheduled Bone Density: Education provided Recommended yearly ophthalmology/optometry visit for glaucoma screening and checkup Recommended yearly dental visit for hygiene and checkup  Vaccinations: Influenza vaccine: up to date Pneumococcal vaccine: up to date Tdap vaccine: up to date Shingles vaccine: up to date      Preventive Care 65 Years and Older, Female Preventive care refers to lifestyle choices and visits with your health care provider that can promote health and wellness. What does preventive care include? A yearly physical exam. This is also called an annual well check. Dental exams once or twice a year. Routine eye exams. Ask your health care provider how often you should have your eyes checked. Personal lifestyle choices, including: Daily care of your teeth and gums. Regular physical activity. Eating a healthy diet. Avoiding tobacco and drug use. Limiting alcohol use. Practicing safe sex. Taking low-dose aspirin every day. Taking vitamin and mineral supplements as recommended by your health care provider. What happens during an annual well check? The services and screenings done by your health care provider during your annual well check will depend on your age, overall health, lifestyle risk factors, and family history of disease. Counseling  Your health care provider may ask you questions about your: Alcohol use. Tobacco use. Drug use. Emotional well-being. Home and relationship well-being. Sexual activity. Eating habits. History of falls. Memory and ability to understand (cognition). Work and work Astronomer. Reproductive health. Screening   You may have the following tests or measurements: Height, weight, and BMI. Blood pressure. Lipid and cholesterol levels. These may be checked every 5 years, or more frequently if you are over 33 years old. Skin check. Lung cancer screening. You may have this screening every year starting at age 5 if you have a 30-pack-year history of smoking and currently smoke or have quit within the past 15 years. Fecal occult blood test (FOBT) of the stool. You may have this test every year starting at age 44. Flexible sigmoidoscopy or colonoscopy. You may have a sigmoidoscopy every 5 years or a colonoscopy every 10 years starting at age 81. Hepatitis C blood test. Hepatitis B blood test. Sexually transmitted disease (STD) testing. Diabetes screening. This is done by checking your blood sugar (glucose) after you have not eaten for a while (fasting). You may have this done every 1-3 years. Bone density scan. This is done to screen for osteoporosis. You may have this done starting at age 83. Mammogram. This may be done every 1-2 years. Talk to your health care provider about how often you should have regular mammograms. Talk with your health care provider about your test results, treatment options, and if necessary, the need for more tests. Vaccines  Your health care provider may recommend certain vaccines, such as: Influenza vaccine. This is recommended every year. Tetanus, diphtheria, and acellular pertussis (Tdap, Td) vaccine. You may need a Td booster every 10 years. Zoster vaccine. You may need this after age 24. Pneumococcal 13-valent conjugate (PCV13) vaccine. One dose is recommended after age 66. Pneumococcal polysaccharide (PPSV23) vaccine. One dose is recommended after age 55. Talk to your health care provider about which screenings and vaccines you need and how often you need them. This information is not  intended to replace advice given to you by your health care provider. Make sure you discuss  any questions you have with your health care provider. Document Released: 05/30/2015 Document Revised: 01/21/2016 Document Reviewed: 03/04/2015 Elsevier Interactive Patient Education  2017 [employer].  Fall Prevention in the Home Falls can cause injuries. They can happen to people of all ages. There are many things you can do to make your home safe and to help prevent falls. What can I do on the outside of my home? Regularly fix the edges of walkways and driveways and fix any cracks. Remove anything that might make you trip as you walk through a door, such as a raised step or threshold. Trim any bushes or trees on the path to your home. Use bright outdoor lighting. Clear any walking paths of anything that might make someone trip, such as rocks or tools. Regularly check to see if handrails are loose or broken. Make sure that both sides of any steps have handrails. Any raised decks and porches should have guardrails on the edges. Have any leaves, snow, or ice cleared regularly. Use sand or salt on walking paths during winter. Clean up any spills in your garage right away. This includes oil or grease spills. What can I do in the bathroom? Use night lights. Install grab bars by the toilet and in the tub and shower. Do not use towel bars as grab bars. Use non-skid mats or decals in the tub or shower. If you need to sit down in the shower, use a plastic, non-slip stool. Keep the floor dry. Clean up any water that spills on the floor as soon as it happens. Remove soap buildup in the tub or shower regularly. Attach bath mats securely with double-sided non-slip rug tape. Do not have throw rugs and other things on the floor that can make you trip. What can I do in the bedroom? Use night lights. Make sure that you have a light by your bed that is easy to reach. Do not use any sheets or blankets that are too big for your bed. They should not hang down onto the floor. Have a firm chair that has  side arms. You can use this for support while you get dressed. Do not have throw rugs and other things on the floor that can make you trip. What can I do in the kitchen? Clean up any spills right away. Avoid walking on wet floors. Keep items that you use a lot in easy-to-reach places. If you need to reach something above you, use a strong step stool that has a grab bar. Keep electrical cords out of the way. Do not use floor polish or wax that makes floors slippery. If you must use wax, use non-skid floor wax. Do not have throw rugs and other things on the floor that can make you trip. What can I do with my stairs? Do not leave any items on the stairs. Make sure that there are handrails on both sides of the stairs and use them. Fix handrails that are broken or loose. Make sure that handrails are as long as the stairways. Check any carpeting to make sure that it is firmly attached to the stairs. Fix any carpet that is loose or worn. Avoid having throw rugs at the top or bottom of the stairs. If you do have throw rugs, attach them to the floor with carpet tape. Make sure that you have a light switch at the top of the stairs  and the bottom of the stairs. If you do not have them, ask someone to add them for you. What else can I do to help prevent falls? Wear shoes that: Do not have high heels. Have rubber bottoms. Are comfortable and fit you well. Are closed at the toe. Do not wear sandals. If you use a stepladder: Make sure that it is fully opened. Do not climb a closed stepladder. Make sure that both sides of the stepladder are locked into place. Ask someone to hold it for you, if possible. Clearly mark and make sure that you can see: Any grab bars or handrails. First and last steps. Where the edge of each step is. Use tools that help you move around (mobility aids) if they are needed. These include: Canes. Walkers. Scooters. Crutches. Turn on the lights when you go into a dark area.  Replace any light bulbs as soon as they burn out. Set up your furniture so you have a clear path. Avoid moving your furniture around. If any of your floors are uneven, fix them. If there are any pets around you, be aware of where they are. Review your medicines with your doctor. Some medicines can make you feel dizzy. This can increase your chance of falling. Ask your doctor what other things that you can do to help prevent falls. This information is not intended to replace advice given to you by your health care provider. Make sure you discuss any questions you have with your health care provider. Document Released: 02/27/2009 Document Revised: 10/09/2015 Document Reviewed: 06/07/2014 Elsevier Interactive Patient Education  2017 [employer].

## 2023-06-30 NOTE — Progress Notes (Signed)
Subjective:   Terri Alexander is a 73 y.o. female who presents for Medicare Annual (Subsequent) preventive examination.  Visit Complete: Virtual I connected with  Terri Alexander on 06/30/23 by a audio enabled telemedicine application and verified that I am speaking with the correct person using two identifiers.  Patient Location: Home  Provider Location: Home Office  I discussed the limitations of evaluation and management by telemedicine. The patient expressed understanding and agreed to proceed.  Vital Signs: Because this visit was a virtual/telehealth visit, some criteria may be missing or patient reported. Any vitals not documented were not able to be obtained and vitals that have been documented are patient reported.  Patient Medicare AWV questionnaire was completed by the patient on 06-24-2023; I have confirmed that all information answered by patient is correct and no changes since this date.  Cardiac Risk Factors include: advanced age (>24men, >49 women)     Objective:    Today's Vitals   06/30/23 0942  PainSc: 0-No pain   There is no height or weight on file to calculate BMI.     06/30/2023    9:46 AM 05/19/2023    8:08 PM 02/16/2023    6:18 AM 09/09/2022    9:14 AM 08/17/2022    6:00 PM 08/17/2022   11:11 AM 06/11/2020    8:32 AM  Advanced Directives  Does Patient Have a Medical Advance Directive? Yes Yes Yes Yes No No Yes  Type of Estate agent of [employer] Power of [place];Living will Healthcare Power of [place];Living will      Does patient want to make changes to medical advance directive?       No - Patient declined  Copy of Healthcare Power of Attorney in Chart? Yes - validated most recent copy scanned in chart (See row information)          Current Medications (verified) Outpatient Encounter Medications as of 06/30/2023  Medication Sig   acetaminophen (TYLENOL) 500 MG tablet Take 1,000 mg by mouth as needed for moderate pain (pain  score 4-6).   alendronate (FOSAMAX) 70 MG tablet TAKE 1 TABLET BY MOUTH ONCE A WEEK ON AN EMPTY STOMACH WITH A FULL GLASS OF WATER (Patient taking differently: Take 70 mg by mouth every Friday.)   apixaban (ELIQUIS) 5 MG TABS tablet Take 1 tablet (5 mg total) by mouth 2 (two) times daily.   Ascorbic Acid (VITAMIN C) 1000 MG tablet Take 1,000 mg by mouth in the morning.   Biotin 1000 MCG tablet Take 1,000 mcg by mouth daily.   Calcium Carbonate-Vit D-Min (CALCIUM 1200 PO) Take 1 tablet by mouth in the morning.   Calcium Polycarbophil (FIBER-CAPS PO) Take 1 capsule by mouth in the morning.   cetirizine (ZYRTEC) 10 MG tablet Take 10 mg by mouth at bedtime.   Cholecalciferol (VITAMIN D3) 125 MCG (5000 UT) TABS Take 5,000 Units by mouth in the morning.   Cranberry 500 MG CHEW Chew 500 mg by mouth in the morning.   cyclobenzaprine (FLEXERIL) 10 MG tablet Take 1 tablet (10 mg total) by mouth 3 (three) times daily as needed for muscle spasms.   docusate sodium (COLACE) 100 MG capsule Take 100 mg by mouth in the morning.   famotidine (PEPCID) 20 MG tablet Take 20 mg by mouth daily as needed for heartburn or indigestion.   furosemide (LASIX) 40 MG tablet Take 1 tablet (40 mg total) by mouth daily. (Patient taking differently: Take 40 mg by mouth  as needed for edema.)   L-Lysine 500 MG CAPS Take 500 mg by mouth in the morning.   Lidocaine (SALONPAS PAIN RELIEVING EX) Apply 1 Application topically daily as needed (pain).   Menthol-Methyl Salicylate (SALONPAS PAIN RELIEF PATCH EX) Apply 1 patch topically daily as needed (pain).   metoprolol succinate (TOPROL-XL) 25 MG 24 hr tablet Take 1 tablet (25 mg total) by mouth daily. Take with or immediately following a meal.   Multiple Vitamin (MULTIVITAMIN WITH MINERALS) TABS tablet Take 1 tablet by mouth in the morning.   omeprazole (PRILOSEC OTC) 20 MG tablet Take 20 mg by mouth daily as needed (acid reflux).   Probiotic Product (PROBIOTIC PO) Take 1 tablet by  mouth in the morning.   No facility-administered encounter medications on file as of 06/30/2023.    Allergies (verified) Patient has no known allergies.   History: Past Medical History:  Diagnosis Date   Adenomatous colon polyp    Allergy    Anemia    as a child, no current problem   Atrial fibrillation (HCC)    Blood transfusion without reported diagnosis    28 months old   Cataract    bilateral - MD just watching    GERD (gastroesophageal reflux disease)    Hyperlipidemia    Osteoporosis 04/15/2016   Skin cancer 2013   Skin cancer- Nose, face   Past Surgical History:  Procedure Laterality Date   ATRIAL FIBRILLATION ABLATION N/A 02/16/2023   Procedure: ATRIAL FIBRILLATION ABLATION;  Surgeon: Maurice Small, MD;  Location: [facility];  Service: Cardiovascular;  Laterality: N/A;   CARDIOVERSION N/A 09/09/2022   Procedure: CARDIOVERSION;  Surgeon: [name], MD;  Location: [facility];  Service: Cardiovascular;  Laterality: N/A;   CESAREAN SECTION  1981   x 1   COLONOSCOPY  2016   hx polyp, tics, sigmoid colon mod tortuous Dr Dory Peru,    EYE SURGERY Bilateral    retina repair x 2   LAPAROTOMY  1979   removal of ovarian cyst   nissan fundiplication  05/17/1997   REPAIR OF COMPLEX TRACTION RETINAL DETACHMENT Bilateral 01/2019   Family History  Problem Relation Age of Onset   Arthritis Mother    Heart disease Mother    Vision loss Mother    Stroke Mother    Depression Sister    Transient ischemic attack Sister 50   Depression Sister    Cancer Maternal Grandmother    Cancer Maternal Grandfather    Early death Maternal Grandfather    Heart disease Maternal Grandfather    Early death Paternal Grandfather    Heart disease Paternal Grandfather    Diabetes Other        Juvenile   Colon cancer Neg Hx    Rectal cancer Neg Hx    Prostate cancer Neg Hx    Social History   Socioeconomic History   Marital status: Married    Spouse name: Not on file    Number of children: Not on file   Years of education: Not on file   Highest education level: 12th grade  Occupational History   Not on file  Tobacco Use   Smoking status: Never   Smokeless tobacco: Never  Vaping Use   Vaping status: Never Used  Substance and Sexual Activity   Alcohol use: Yes    Alcohol/week: 1.0 standard drink of alcohol    Types: 1 Glasses of wine per week    Comment: rarely  Drug use: No   Sexual activity: Not Currently    Birth control/protection: Post-menopausal  Other Topics Concern   Not on file  Social History Narrative   Not on file   Social Drivers of Health   Financial Resource Strain: Low Risk  (06/30/2023)   Overall Financial Resource Strain (CARDIA)    Difficulty of Paying Living Expenses: Not hard at all  Food Insecurity: No Food Insecurity (06/30/2023)   Hunger Vital Sign    Worried About Running Out of Food in the Last Year: Never true    Ran Out of Food in the Last Year: Never true  Transportation Needs: No Transportation Needs (06/30/2023)   PRAPARE - Administrator, Civil Service (Medical): No    Lack of Transportation (Non-Medical): No  Physical Activity: Insufficiently Active (06/30/2023)   Exercise Vital Sign    Days of Exercise per Week: 4 days    Minutes of Exercise per Session: 20 min  Stress: No Stress Concern Present (06/30/2023)   [employer] of Occupational Health - Occupational Stress Questionnaire    Feeling of Stress : Not at all  Social Connections: Moderately Integrated (06/30/2023)   Social Connection and Isolation Panel [NHANES]    Frequency of Communication with Friends and Family: Twice a week    Frequency of Social Gatherings with Friends and Family: Once a week    Attends Religious Services: 1 to 4 times per year    Active Member of [employer] or Organizations: No    Attends Engineer, structural: Never    Marital Status: Married    Tobacco Counseling Counseling given: Not  Answered   Clinical Intake:  Pre-visit preparation completed: Yes  Pain : No/denies pain Pain Score: 0-No pain Pain Location: Leg Pain Orientation: Left     Diabetes: No  How often do you need to have someone help you when you read instructions, pamphlets, or other written materials from your doctor or pharmacy?: 1 - Never  Interpreter Needed?: No  Information entered by :: Remi Haggard LPN   Activities of Daily Living    06/30/2023    9:48 AM 06/24/2023   11:37 AM  In your present state of health, do you have any difficulty performing the following activities:  Hearing? 0 0  Vision? 0 0  Difficulty concentrating or making decisions? 0 0  Walking or climbing stairs? 0 0  Dressing or bathing? 0 0  Doing errands, shopping? 0 0  Preparing Food and eating ? N N  Using the Toilet? N N  In the past six months, have you accidently leaked urine? Y Y  Do you have problems with loss of bowel control? N N  Managing your Medications? N N  Managing your Finances? N N  Housekeeping or managing your Housekeeping? N N    Patient Care Team: Donita Brooks, MD as PCP - General (Family Medicine) Wyline Mood Alben Spittle, MD as PCP - Cardiology (Cardiology) Mealor, Roberts Gaudy, MD as PCP - Electrophysiology (Cardiology)  Indicate any recent Medical Services you may have received from other than [facility] providers in the past year (date may be approximate).     Assessment:   This is a routine wellness examination for Judie.  Hearing/Vision screen Hearing Screening - Comments:: No trouble hearing Vision Screening - Comments:: Up to date [facility]   Goals Addressed             This Visit's Progress    Weight (lb) < 200 lb (90.7  kg)         Depression Screen    06/30/2023    9:49 AM 06/14/2023    9:18 AM 06/11/2022    8:39 AM 06/09/2021    8:32 AM 06/11/2020    8:28 AM 05/29/2019    3:00 PM 03/21/2018    9:40 AM  PHQ 2/9 Scores  PHQ - 2 Score 0  0 0 1 1 0  PHQ- 9 Score 0   0 1     Exception Documentation  Patient refusal         Fall Risk    06/30/2023    9:44 AM 06/24/2023   11:37 AM 06/14/2023    9:18 AM 06/11/2022    8:39 AM 04/19/2022   11:04 AM  Fall Risk   Falls in the past year? 0 0 0 0 0  Number falls in past yr: 0 0 0 0   Injury with Fall? 0 0 0 0   Risk for fall due to :   No Fall Risks No Fall Risks   Follow up Falls evaluation completed;Education provided;Falls prevention discussed  Falls prevention discussed Falls prevention discussed     MEDICARE RISK AT HOME: Medicare Risk at Home Any stairs in or around the home?: No If so, are there any without handrails?: No Home free of loose throw rugs in walkways, pet beds, electrical cords, etc?: Yes Adequate lighting in your home to reduce risk of falls?: Yes Life alert?: No Use of a cane, walker or w/c?: No Grab bars in the bathroom?: Yes Shower chair or bench in shower?: Yes Elevated toilet seat or a handicapped toilet?: Yes  TIMED UP AND GO:  Was the test performed?  No    Cognitive Function:        06/30/2023   11:31 AM 06/30/2023    9:44 AM  6CIT Screen  What Year? 0 points 0 points  What month? 0 points 0 points  What time? 0 points 0 points  Count back from 20 0 points 0 points  Months in reverse 0 points 0 points  Repeat phrase 0 points 0 points  Total Score 0 points 0 points    Immunizations Immunization History  Administered Date(s) Administered   Fluad Quad(high Dose 65+) 02/14/2019, 01/17/2020   Influenza, High Dose Seasonal PF 02/23/2016, 01/05/2017   Influenza,inj,Quad PF,6+ Mos 03/10/2015, 02/08/2018   Influenza-Unspecified 01/16/2021, 02/17/2022, 01/10/2023   PFIZER(Purple Top)SARS-COV-2 Vaccination 09/12/2019, 10/02/2019, 04/04/2020   Pfizer Covid-19 Vaccine Bivalent Booster 65yrs & up 08/16/2020, 02/11/2021, 11/30/2021   Pneumococcal Conjugate-13 03/15/2016   Pneumococcal Polysaccharide-23 03/16/2017   Tdap 03/10/2015   Unspecified SARS-COV-2 Vaccination  01/10/2023   Zoster Recombinant(Shingrix) 05/29/2018, 09/26/2018   Zoster, Live 04/23/2013    TDAP status: Up to date  Flu Vaccine status: Up to date  Pneumococcal vaccine status: Up to date  Covid-19 vaccine status: Information provided on how to obtain vaccines.   Qualifies for Shingles Vaccine? No   Zostavax completed Yes   Shingrix Completed?: Yes  Screening Tests Health Maintenance  Topic Date Due   COVID-19 Vaccine (8 - 2024-25 season) 06/30/2023 (Originally 03/07/2023)   Medicare Annual Wellness (AWV)  06/29/2024   MAMMOGRAM  07/27/2024   DTaP/Tdap/Td (2 - Td or Tdap) 03/09/2025   Colonoscopy  10/21/2029   Pneumonia Vaccine 68+ Years old  Completed   INFLUENZA VACCINE  Completed   DEXA SCAN  Completed   Hepatitis C Screening  Completed   Zoster Vaccines- Shingrix  Completed  HPV VACCINES  Aged Out    Health Maintenance  There are no preventive care reminders to display for this patient.  Colorectal cancer screening: No longer required.   Mammogram status: Completed  . Repeat every year  Bone Density status: Ordered  . Pt provided with contact info and advised to call to schedule appt.  Lung Cancer Screening: (Low Dose CT Chest recommended if Age 5-80 years, 20 pack-year currently smoking OR have quit w/in 15years.) does not qualify.   Lung Cancer Screening Referral:   Additional Screening:  Hepatitis C Screening: does not qualify; Completed 2018  Vision Screening: Recommended annual ophthalmology exams for early detection of glaucoma and other disorders of the eye. Is the patient up to date with their annual eye exam?  Yes  Who is the provider or what is the name of the office in which the patient attends annual eye exams? [facility] If pt is not established with a provider, would they like to be referred to a provider to establish care? No .   Dental Screening: Recommended annual dental exams for proper oral hygiene    Community Resource Referral /  Chronic Care Management: CRR required this visit?  No   CCM required this visit?  No     Plan:     I have personally reviewed and noted the following in the patient's chart:   Medical and social history Use of alcohol, tobacco or illicit drugs  Current medications and supplements including opioid prescriptions. Patient is not currently taking opioid prescriptions. Functional ability and status Nutritional status Physical activity Advanced directives List of other physicians Hospitalizations, surgeries, and ER visits in previous 12 months Vitals Screenings to include cognitive, depression, and falls Referrals and appointments  In addition, I have reviewed and discussed with patient certain preventive protocols, quality metrics, and best practice recommendations. A written personalized care plan for preventive services as well as general preventive health recommendations were provided to patient.     Remi Haggard, LPN   08/23/8117   After Visit Summary: (MyChart) Due to this being a telephonic visit, the after visit summary with patients personalized plan was offered to patient via MyChart   Nurse Notes:

## 2023-07-11 ENCOUNTER — Other Ambulatory Visit: Payer: Self-pay

## 2023-07-11 ENCOUNTER — Encounter: Payer: Self-pay | Admitting: Family Medicine

## 2023-07-11 DIAGNOSIS — M810 Age-related osteoporosis without current pathological fracture: Secondary | ICD-10-CM

## 2023-07-11 MED ORDER — ALENDRONATE SODIUM 70 MG PO TABS: ORAL_TABLET | ORAL | 3 refills | Status: DC

## 2023-07-25 ENCOUNTER — Other Ambulatory Visit: Payer: Self-pay

## 2023-07-25 DIAGNOSIS — I71019 Dissection of thoracic aorta, unspecified: Secondary | ICD-10-CM

## 2023-07-29 ENCOUNTER — Ambulatory Visit
Admission: RE | Admit: 2023-07-29 | Discharge: 2023-07-29 | Disposition: A | Discharge status: Home / Self Care | Payer: Medicare Other | Source: Ambulatory Visit | Attending: Family Medicine | Admitting: Family Medicine

## 2023-07-29 DIAGNOSIS — Z1231 Encounter for screening mammogram for malignant neoplasm of breast: Secondary | ICD-10-CM | POA: Diagnosis not present

## 2023-08-09 DIAGNOSIS — Z23 Encounter for immunization: Secondary | ICD-10-CM | POA: Diagnosis not present

## 2023-08-17 ENCOUNTER — Ambulatory Visit
Admission: RE | Admit: 2023-08-17 | Discharge: 2023-08-17 | Disposition: A | Discharge status: Home / Self Care | Source: Ambulatory Visit | Attending: Vascular Surgery

## 2023-08-17 DIAGNOSIS — I71019 Dissection of thoracic aorta, unspecified: Secondary | ICD-10-CM

## 2023-08-17 DIAGNOSIS — I77810 Thoracic aortic ectasia: Secondary | ICD-10-CM | POA: Diagnosis not present

## 2023-08-17 MED ORDER — IOPAMIDOL (ISOVUE-370) INJECTION 76%
75.0000 mL | Freq: Once | INTRAVENOUS | Status: AC | PRN
  Administered 2023-08-17: 75 mL via INTRAVENOUS

## 2023-08-17 NOTE — Progress Notes (Unsigned)
 Office Note    HPI: Terri Alexander is a 73 y.o. (1950/08/18) female presenting in follow up with a known type B aortic dissection.  In April 2024, Terri Alexander underwent chest CT demonstrating chronic type B aortic dissection with some aneurysmal dilatation of the descending aorta.  The false lumen was thrombosed.  Follow-up was scheduled.  Originally from [place], her and her husband met at a bar when he was in the service.  They have a son who went to [facility], and stayed, starting a family here in [place].  They moved closer to him to be close to the grandchildren.  She denies chest pain, back pain, abdominal pain.    On exam today, Terri Alexander was doing well, accompanied by her husband.  They continue to spend time with her grandchildren as much as possible.  They plan on traveling out of the country for the first time in December 2 [place].  Denies chest pain, back pain, abdominal pain.   The pt is not on a statin for cholesterol management.  The pt is not on a daily aspirin.   Other AC:  eliquis The pt is on medication for hypertension.   The pt is not diabetic.  Tobacco hx:  -  Past Medical History:  Diagnosis Date   Adenomatous colon polyp    Allergy    Anemia    as a child, no current problem   Atrial fibrillation (HCC)    Blood transfusion without reported diagnosis    30 months old   Cataract    bilateral - MD just watching    GERD (gastroesophageal reflux disease)    Hyperlipidemia    Osteoporosis 04/15/2016   Skin cancer 2013   Skin cancer- Nose, face    Past Surgical History:  Procedure Laterality Date   ATRIAL FIBRILLATION ABLATION N/A 02/16/2023   Procedure: ATRIAL FIBRILLATION ABLATION;  Surgeon: Maurice Small, MD;  Location: [facility];  Service: Cardiovascular;  Laterality: N/A;   CARDIOVERSION N/A 09/09/2022   Procedure: CARDIOVERSION;  Surgeon: [name], MD;  Location: [facility];  Service: Cardiovascular;  Laterality: N/A;    CESAREAN SECTION  1981   x 1   COLONOSCOPY  2016   hx polyp, tics, sigmoid colon mod tortuous Dr Dory Peru,    EYE SURGERY Bilateral    retina repair x 2   LAPAROTOMY  1979   removal of ovarian cyst   nissan fundiplication  05/17/1997   REPAIR OF COMPLEX TRACTION RETINAL DETACHMENT Bilateral 01/2019    Social History   Socioeconomic History   Marital status: Married    Spouse name: Not on file   Number of children: Not on file   Years of education: Not on file   Highest education level: 12th grade  Occupational History   Not on file  Tobacco Use   Smoking status: Never   Smokeless tobacco: Never  Vaping Use   Vaping status: Never Used  Substance and Sexual Activity   Alcohol use: Yes    Alcohol/week: 1.0 standard drink of alcohol    Types: 1 Glasses of wine per week    Comment: rarely   Drug use: No   Sexual activity: Not Currently    Birth control/protection: Post-menopausal  Other Topics Concern   Not on file  Social History Narrative   Not on file   Social Drivers of Health   Financial Resource Strain: Low Risk  (06/30/2023)   Overall Financial Resource Strain (CARDIA)  Difficulty of Paying Living Expenses: Not hard at all  Food Insecurity: No Food Insecurity (06/30/2023)   Hunger Vital Sign    Worried About Running Out of Food in the Last Year: Never true    Ran Out of Food in the Last Year: Never true  Transportation Needs: No Transportation Needs (06/30/2023)   PRAPARE - Administrator, Civil Service (Medical): No    Lack of Transportation (Non-Medical): No  Physical Activity: Insufficiently Active (06/30/2023)   Exercise Vital Sign    Days of Exercise per Week: 4 days    Minutes of Exercise per Session: 20 min  Stress: No Stress Concern Present (06/30/2023)   Terri Alexander of Occupational Health - Occupational Stress Questionnaire    Feeling of Stress : Not at all  Social Connections: Moderately Integrated (06/30/2023)   Social Connection and  Isolation Panel [NHANES]    Frequency of Communication with Friends and Family: Twice a week    Frequency of Social Gatherings with Friends and Family: Once a week    Attends Religious Services: 1 to 4 times per year    Active Member of [employer] or Organizations: No    Attends Banker Meetings: Never    Marital Status: Married  Catering manager Violence: Unknown (06/30/2023)   Humiliation, Afraid, Rape, and Kick questionnaire    Fear of Current or Ex-Partner: No    Emotionally Abused: No    Physically Abused: No    Sexually Abused: Not on file   Family History  Problem Relation Age of Onset   Arthritis Mother    Heart disease Mother    Vision loss Mother    Stroke Mother    Depression Sister    Transient ischemic attack Sister 64   Depression Sister    Cancer Maternal Grandmother    Cancer Maternal Grandfather    Early death Maternal Grandfather    Heart disease Maternal Grandfather    Early death Paternal Grandfather    Heart disease Paternal Grandfather    Diabetes Other        Juvenile   Colon cancer Neg Hx    Rectal cancer Neg Hx    Prostate cancer Neg Hx     Current Outpatient Medications  Medication Sig Dispense Refill   acetaminophen (TYLENOL) 500 MG tablet Take 1,000 mg by mouth as needed for moderate pain (pain score 4-6).     alendronate (FOSAMAX) 70 MG tablet TAKE 1 TABLET BY MOUTH ONCE A WEEK ON AN EMPTY STOMACH WITH A FULL GLASS OF WATER 12 tablet 3   Ascorbic Acid (VITAMIN C) 1000 MG tablet Take 1,000 mg by mouth in the morning.     Biotin 1000 MCG tablet Take 1,000 mcg by mouth daily.     Calcium Carbonate-Vit D-Min (CALCIUM 1200 PO) Take 1 tablet by mouth in the morning.     Calcium Polycarbophil (FIBER-CAPS PO) Take 1 capsule by mouth in the morning.     cetirizine (ZYRTEC) 10 MG tablet Take 10 mg by mouth at bedtime.     Cholecalciferol (VITAMIN D3) 125 MCG (5000 UT) TABS Take 5,000 Units by mouth in the morning.     Cranberry 500 MG CHEW Chew  500 mg by mouth in the morning.     cyclobenzaprine (FLEXERIL) 10 MG tablet Take 1 tablet (10 mg total) by mouth 3 (three) times daily as needed for muscle spasms. 30 tablet 0   dabigatran (PRADAXA) 150 MG CAPS capsule Take 1 capsule (150  mg total) by mouth 2 (two) times daily. 180 capsule 2   docusate sodium (COLACE) 100 MG capsule Take 100 mg by mouth in the morning.     famotidine (PEPCID) 20 MG tablet Take 20 mg by mouth daily as needed for heartburn or indigestion.     furosemide (LASIX) 40 MG tablet Take 1 tablet (40 mg total) by mouth daily. (Patient taking differently: Take 40 mg by mouth as needed for edema.) 30 tablet 3   L-Lysine 500 MG CAPS Take 500 mg by mouth in the morning.     Lidocaine (SALONPAS PAIN RELIEVING EX) Apply 1 Application topically daily as needed (pain).     Menthol-Methyl Salicylate (SALONPAS PAIN RELIEF PATCH EX) Apply 1 patch topically daily as needed (pain).     metoprolol succinate (TOPROL-XL) 25 MG 24 hr tablet Take 1 tablet (25 mg total) by mouth daily. Take with or immediately following a meal. 90 tablet 2   Multiple Vitamin (MULTIVITAMIN WITH MINERALS) TABS tablet Take 1 tablet by mouth in the morning.     omeprazole (PRILOSEC OTC) 20 MG tablet Take 20 mg by mouth daily as needed (acid reflux).     Probiotic Product (PROBIOTIC PO) Take 1 tablet by mouth in the morning.     No current facility-administered medications for this visit.   Facility-Administered Medications Ordered in Other Visits  Medication Dose Route Frequency Provider Last Rate Last Admin   iopamidol (ISOVUE-370) 76 % injection 75 mL  75 mL Intravenous Once PRN Victorino Sparrow, MD        No Known Allergies   REVIEW OF SYSTEMS:  [X]  denotes positive finding, [ ]  denotes negative finding Cardiac  Comments:  Chest pain or chest pressure:    Shortness of breath upon exertion:    Short of breath when lying flat:    Irregular heart rhythm:        Vascular    Pain in calf, thigh, or hip  brought on by ambulation:    Pain in feet at night that wakes you up from your sleep:     Blood clot in your veins:    Leg swelling:         Pulmonary    Oxygen at home:    Productive cough:     Wheezing:         Neurologic    Sudden weakness in arms or legs:     Sudden numbness in arms or legs:     Sudden onset of difficulty speaking or slurred speech:    Temporary loss of vision in one eye:     Problems with dizziness:         Gastrointestinal    Blood in stool:     Vomited blood:         Genitourinary    Burning when urinating:     Blood in urine:        Psychiatric    Major depression:         Hematologic    Bleeding problems:    Problems with blood clotting too easily:        Skin    Rashes or ulcers:        Constitutional    Fever or chills:      PHYSICAL EXAMINATION:  There were no vitals filed for this visit.  General:  WDWN in NAD; vital signs documented above Gait: Not observed HENT: WNL, normocephalic Pulmonary: normal non-labored breathing , without wheezing Cardiac:  regular HR Abdomen: soft, NT, no masses Skin: without rashes Vascular Exam/Pulses:  Right Left  Radial 2+ (normal) 2+ (normal)  Ulnar    Femoral    Popliteal    DP 2+ (normal) 2+ (normal)  PT     Extremities: without ischemic changes, without Gangrene , without cellulitis; without open wounds;  Musculoskeletal: no muscle wasting or atrophy  Neurologic: A&O X 3;  No focal weakness or paresthesias are detected Psychiatric:  The pt has Normal affect.   Non-Invasive Vascular Imaging:    See CT    ASSESSMENT/PLAN: KYELLE URBAS is a 73 y.o. female presenting in follow-up with known type B aortic dissection with aneurysmal degeneration of the descending aorta.  Most recent CT was independently reviewed.  No change in size over the last 18 months.  Current measurement 50mm.  I had a long discussion with Debany and her husband regarding her chronic type B aortic dissection  with aneurysmal degeneration.  Fortunately, she has had no significant changes over the last 6 months.  Blood pressure well-controlled.  Although there is been no growth over the last 18 months, at 5 cm, she is close the 5.5 cm threshold in which I would offer repair.  My plan is to follow her with a noncontrasted, low radiation CT scan in another 6 months to ensure there is no growth.  We discussed the signs and symptoms of acute aortic syndrome.  I asked her to call 911 should any new-onset back pain, chest pain occur.   Victorino Sparrow, MD [facility] [phone] Total time of patient care including pre-visit research, consultation, and documentation greater than 30 minutes

## 2023-08-18 ENCOUNTER — Ambulatory Visit (INDEPENDENT_AMBULATORY_CARE_PROVIDER_SITE_OTHER): Admitting: Vascular Surgery

## 2023-08-18 ENCOUNTER — Encounter: Payer: Self-pay | Admitting: Vascular Surgery

## 2023-08-18 VITALS — BP 135/79 | HR 66 | Temp 97.9°F | Ht 62.0 in | Wt 134.0 lb

## 2023-08-18 DIAGNOSIS — I7123 Aneurysm of the descending thoracic aorta, without rupture: Secondary | ICD-10-CM | POA: Diagnosis not present

## 2023-08-18 DIAGNOSIS — I71019 Dissection of thoracic aorta, unspecified: Secondary | ICD-10-CM

## 2023-08-25 ENCOUNTER — Ambulatory Visit: Payer: Medicare Other | Admitting: Vascular Surgery

## 2023-09-01 ENCOUNTER — Telehealth (HOSPITAL_COMMUNITY): Payer: Self-pay | Admitting: *Deleted

## 2023-09-01 NOTE — Telephone Encounter (Signed)
 Patient called to report she had a 15 minute episode of heart rates staying 120-160. She felt lightheaded until the elevated rates subsided. She had just taken her metoprolol before the episode occurred. She will call if episodes become more frequent.

## 2023-09-15 ENCOUNTER — Other Ambulatory Visit: Payer: Self-pay

## 2023-09-15 ENCOUNTER — Encounter (HOSPITAL_COMMUNITY): Payer: Self-pay

## 2023-09-15 ENCOUNTER — Emergency Department (HOSPITAL_COMMUNITY)
Admission: EM | Admit: 2023-09-15 | Discharge: 2023-09-15 | Disposition: A | Discharge status: Home / Self Care | Attending: Emergency Medicine | Admitting: Emergency Medicine

## 2023-09-15 DIAGNOSIS — I10 Essential (primary) hypertension: Secondary | ICD-10-CM | POA: Diagnosis not present

## 2023-09-15 DIAGNOSIS — R739 Hyperglycemia, unspecified: Secondary | ICD-10-CM

## 2023-09-15 DIAGNOSIS — Z7901 Long term (current) use of anticoagulants: Secondary | ICD-10-CM | POA: Diagnosis not present

## 2023-09-15 DIAGNOSIS — R42 Dizziness and giddiness: Secondary | ICD-10-CM | POA: Diagnosis not present

## 2023-09-15 DIAGNOSIS — R7309 Other abnormal glucose: Secondary | ICD-10-CM | POA: Insufficient documentation

## 2023-09-15 DIAGNOSIS — I471 Supraventricular tachycardia, unspecified: Secondary | ICD-10-CM | POA: Diagnosis not present

## 2023-09-15 DIAGNOSIS — R000 Tachycardia, unspecified: Secondary | ICD-10-CM | POA: Diagnosis not present

## 2023-09-15 LAB — CBC WITH DIFFERENTIAL/PLATELET
Abs Immature Granulocytes: 0.03 K/uL (ref 0.00–0.07)
Basophils Absolute: 0.1 K/uL (ref 0.0–0.1)
Basophils Relative: 1 %
Eosinophils Absolute: 0 K/uL (ref 0.0–0.5)
Eosinophils Relative: 0 %
HCT: 40.3 % (ref 36.0–46.0)
Hemoglobin: 13.4 g/dL (ref 12.0–15.0)
Immature Granulocytes: 0 %
Lymphocytes Relative: 26 %
Lymphs Abs: 2.3 K/uL (ref 0.7–4.0)
MCH: 30.4 pg (ref 26.0–34.0)
MCHC: 33.3 g/dL (ref 30.0–36.0)
MCV: 91.4 fL (ref 80.0–100.0)
Monocytes Absolute: 0.9 K/uL (ref 0.1–1.0)
Monocytes Relative: 11 %
Neutro Abs: 5.5 K/uL (ref 1.7–7.7)
Neutrophils Relative %: 62 %
Platelets: 291 K/uL (ref 150–400)
RBC: 4.41 MIL/uL (ref 3.87–5.11)
RDW: 14.5 % (ref 11.5–15.5)
WBC: 8.9 K/uL (ref 4.0–10.5)
nRBC: 0 % (ref 0.0–0.2)

## 2023-09-15 LAB — BASIC METABOLIC PANEL WITH GFR
Anion gap: 12 (ref 5–15)
BUN: 17 mg/dL (ref 8–23)
CO2: 22 mmol/L (ref 22–32)
Calcium: 8.8 mg/dL — ABNORMAL LOW (ref 8.9–10.3)
Chloride: 106 mmol/L (ref 98–111)
Creatinine, Ser: 0.7 mg/dL (ref 0.44–1.00)
GFR, Estimated: >60 mL/min (ref >60)
Glucose, Bld: 132 mg/dL — ABNORMAL HIGH (ref 70–99)
Potassium: 3.6 mmol/L (ref 3.5–5.1)
Sodium: 140 mmol/L (ref 135–145)

## 2023-09-15 LAB — MAGNESIUM: Magnesium: 1.9 mg/dL (ref 1.7–2.4)

## 2023-09-15 NOTE — ED Provider Notes (Signed)
 [place] EMERGENCY DEPARTMENT AT [facility] Provider Note   CSN: [id number] Arrival date & time: 09/15/23  4098     History  Chief Complaint  Patient presents with   Tachycardia    Terri Alexander is a 73 y.o. female.  The history is provided by the patient and the EMS personnel.  She has history of hyperlipidemia, atrial fibrillation anticoagulated on dabigatran  and comes in by ambulance because she felt her heart racing tonight.  It woke her up.  She denies chest pain, heaviness, tightness, pressure.  She denies nausea or vomiting.  EMS noted rhythm of SVT and treated her with adenosine with conversion to sinus rhythm.  Patient states that she feels like she is back to her baseline.   Home Medications Prior to Admission medications   Medication Sig Start Date End Date Taking? Authorizing Provider  acetaminophen  (TYLENOL ) 500 MG tablet Take 1,000 mg by mouth as needed for moderate pain (pain score 4-6).    [provider]  alendronate  (FOSAMAX ) 70 MG tablet TAKE 1 TABLET BY MOUTH ONCE A WEEK ON AN EMPTY STOMACH WITH A FULL GLASS OF WATER 07/11/23   Austine Lefort, MD  Ascorbic Acid (VITAMIN C) 1000 MG tablet Take 1,000 mg by mouth in the morning.    [provider]  Biotin 1000 MCG tablet Take 1,000 mcg by mouth daily.    [provider]  Calcium Carbonate-Vit D-Min (CALCIUM 1200 PO) Take 1 tablet by mouth in the morning.    [provider]  Calcium Polycarbophil (FIBER-CAPS PO) Take 1 capsule by mouth in the morning.    [provider]  cetirizine (ZYRTEC) 10 MG tablet Take 10 mg by mouth at bedtime.    [provider]  Cholecalciferol (VITAMIN D3) 125 MCG (5000 UT) TABS Take 5,000 Units by mouth in the morning.    [provider]  Cranberry 500 MG CHEW Chew 500 mg by mouth in the morning.    [provider]  cyclobenzaprine  (FLEXERIL ) 10 MG tablet Take 1 tablet (10 mg total) by mouth 3 (three)  times daily as needed for muscle spasms. 06/14/23   Austine Lefort, MD  dabigatran  (PRADAXA ) 150 MG CAPS capsule Take 1 capsule (150 mg total) by mouth 2 (two) times daily. 06/30/23   Nathanel Bal, PA-C  docusate sodium  (COLACE) 100 MG capsule Take 100 mg by mouth in the morning.    [provider]  famotidine  (PEPCID ) 20 MG tablet Take 20 mg by mouth daily as needed for heartburn or indigestion.    [provider]  furosemide  (LASIX ) 40 MG tablet Take 1 tablet (40 mg total) by mouth daily. Patient taking differently: Take 40 mg by mouth as needed for edema. 08/09/22   Austine Lefort, MD  L-Lysine 500 MG CAPS Take 500 mg by mouth in the morning.    [provider]  Lidocaine  (SALONPAS PAIN RELIEVING EX) Apply 1 Application topically daily as needed (pain).    [provider]  Menthol-Methyl Salicylate (SALONPAS PAIN RELIEF PATCH EX) Apply 1 patch topically daily as needed (pain).    [provider]  metoprolol  succinate (TOPROL -XL) 25 MG 24 hr tablet Take 1 tablet (25 mg total) by mouth daily. Take with or immediately following a meal. 06/20/23   Nathanel Bal, PA-C  Multiple Vitamin (MULTIVITAMIN WITH MINERALS) TABS tablet Take 1 tablet by mouth in the morning.    [provider]  omeprazole (PRILOSEC OTC)  20 MG tablet Take 20 mg by mouth daily as needed (acid reflux).    [provider]  Probiotic Product (PROBIOTIC PO) Take 1 tablet by mouth in the morning.    [provider]      Allergies    Patient has no known allergies.    Review of Systems   Review of Systems  All other systems reviewed and are negative.   Physical Exam Updated Vital Signs BP 121/63   Pulse 66   Temp 98 F (36.7 C) (Oral)   Resp 17   Wt 59.4 kg   LMP  (LMP Unknown)   SpO2 99%   BMI 23.96 kg/m  Physical Exam Vitals and nursing note reviewed.   73 year old female, resting comfortably and in no acute distress. Vital signs are  normal. Oxygen saturation is 100%, which is normal. Head is normocephalic and atraumatic. PERRLA, EOMI.  Lungs are clear without rales, wheezes, or rhonchi. Chest is nontender. Heart has regular rate and rhythm without murmur. Abdomen is soft, flat, nontender. Neurologic: Mental status is normal, moves all extremities equally.  ED Results / Procedures / Treatments   Labs (all labs ordered are listed, but only abnormal results are displayed) Labs Reviewed  BASIC METABOLIC PANEL WITH GFR - Abnormal; Notable for the following components:      Result Value   Glucose, Bld 132 (*)    Calcium 8.8 (*)    All other components within normal limits  CBC WITH DIFFERENTIAL/PLATELET  MAGNESIUM   Procedures Procedures  Cardiac monitor shows normal sinus rhythm, per my interpretation.  Medications Ordered in ED Medications - No data to display  ED Course/ Medical Decision Making/ A&P                                 Medical Decision Making Amount and/or Complexity of Data Reviewed Labs: ordered.   Episode of SVT treated successfully by EMS.  I reviewed her past records, and note cardiac ablation on 02/16/2023, ED visit for SVT on 05/19/2023.  I have reviewed her electrocardiogram, my interpretation is sinus rhythm with first-degree AV block and unchanged from prior.  I have reviewed her laboratory tests, and my interpretation is elevated random glucose level and otherwise normal metabolic panel, normal CBC, normal magnesium level.  Heart rhythm has remained stable.  I am discharging her with instructions to follow-up with her cardiologist.  Final Clinical Impression(s) / ED Diagnoses Final diagnoses:  SVT (supraventricular tachycardia) (HCC)  Elevated random blood glucose level  Chronic anticoagulation    Rx / DC Orders ED Discharge Orders     None         Alissa April, MD 09/15/23 [phone]

## 2023-09-15 NOTE — ED Triage Notes (Signed)
 BIB EMS/ pt called d/t "fluttering in chest"/ HR of 160 upon arrival/ hx of SVT/ ablation done in Oct 2024/ pt given 6mg  adenosine en route/ HR now 80/ pt feeling better

## 2023-09-15 NOTE — ED Notes (Signed)
 Patient discharged in stable condition.

## 2023-09-19 ENCOUNTER — Telehealth: Payer: Self-pay | Admitting: Cardiovascular Disease

## 2023-09-19 NOTE — Telephone Encounter (Signed)
 STAT if HR is under 50 or over 120 (normal HR is 60-100 beats per minute)  What is your heart rate? 57   Do you have a log of your heart rate readings (document readings)? Didn't have log   Do you have any other symptoms? Pt states she has had "three episodes of SVT" since her ablation. She states she doesn't have any other symptoms. She states her bp is 109/83 hr 57 currently. Please advise.

## 2023-09-19 NOTE — Telephone Encounter (Signed)
 Spoke with patient and she states May 1 she had to call EMS and go to ED her HR was at 157. She states she was asleep and it woke her up out her sleep. She states she is really concerned because it has happened 3 times and 2 of them she needed EMS.   She called and spoke with AFIB clinic and they informed her being that its not AFIB she will need to speak with Dr. Arlester Ladd.   Today she is asymptomatic. Appointment scheduled with provider

## 2023-09-21 NOTE — Telephone Encounter (Signed)
 Spoke with patient, confirmed appointment on 09/29/23 with Dr Arlester Ladd - no needs at this time

## 2023-09-22 ENCOUNTER — Encounter: Payer: Self-pay | Admitting: Family Medicine

## 2023-09-22 ENCOUNTER — Other Ambulatory Visit: Payer: Self-pay | Admitting: Family Medicine

## 2023-09-22 MED ORDER — CYCLOBENZAPRINE HCL 10 MG PO TABS: 10.0000 mg | ORAL_TABLET | Freq: Three times a day (TID) | ORAL | 0 refills | Status: DC | PRN

## 2023-09-29 ENCOUNTER — Ambulatory Visit: Attending: Cardiovascular Disease | Admitting: Cardiovascular Disease

## 2023-09-29 ENCOUNTER — Encounter: Payer: Self-pay | Admitting: Cardiovascular Disease

## 2023-09-29 VITALS — BP 114/76 | HR 70 | Ht 62.0 in | Wt 132.0 lb

## 2023-09-29 DIAGNOSIS — I4819 Other persistent atrial fibrillation: Secondary | ICD-10-CM | POA: Diagnosis not present

## 2023-09-29 MED ORDER — METOPROLOL SUCCINATE ER 25 MG PO TB24: ORAL_TABLET | ORAL | 3 refills | Status: DC

## 2023-09-29 MED ORDER — FLECAINIDE ACETATE 50 MG PO TABS: ORAL_TABLET | ORAL | 0 refills | Status: DC

## 2023-09-29 NOTE — Patient Instructions (Signed)
 Medication Instructions:  Increase Metoprolol  Succinate to 12.5 mg in the morning and 25 mg in the evening START Flecainide 50 mg only as needed for rapid heart rates  *If you need a refill on your cardiac medications before your next appointment, please call your pharmacy*  Lab Work: CBC and BMET - please have pre-procedure lab work completed on Wednesday, June 25 at ANY [facility] near you. No appointment is required and this does not need to be fasting If you have labs (blood work) drawn today and your tests are completely normal, you will receive your results only by: MyChart Message (if you have MyChart) OR A paper copy in the mail If you have any lab test that is abnormal or we need to change your treatment, we will call you to review the results.  Testing/Procedures: EP Study with possible SVT ablation - scheduled for Friday, July 25 Your physician has recommended that you have an ablation. Catheter ablation is a medical procedure used to treat some cardiac arrhythmias (irregular heartbeats). During catheter ablation, a long, thin, flexible tube is put into a blood vessel in your groin (upper thigh), or neck. This tube is called an ablation catheter. It is then guided to your heart through the blood vessel. Radio frequency waves destroy small areas of heart tissue where abnormal heartbeats may cause an arrhythmia to start. Please see the instruction sheet given to you today.   Follow-Up: At [facility], you and your health needs are our priority.  As part of our continuing mission to provide you with exceptional heart care, our providers are all part of one team.  This team includes your primary Cardiologist (physician) and Advanced Practice Providers or APPs (Physician Assistants and Nurse Practitioners) who all work together to provide you with the care you need, when you need it.  Your next appointment:   We will schedule follow up after your ablation for you   Provider:    Marlane Silver, MD

## 2023-09-29 NOTE — Progress Notes (Signed)
 Electrophysiology Office Note:    Date:  09/29/2023   ID:  ALLIEN BEARUP, DOB 14-Feb-1951, MRN [id number]  PCP:  Austine Lefort, MD   [facility] Providers Cardiologist:  Bridgette Campus, MD Electrophysiologist:  Efraim Grange, MD     Referring MD: Austine Lefort, MD   History of Present Illness:    Terri Alexander is a 73 y.o. female with a hx listed below, significant for descending thoracic aneurysm, referred for arrhythmia management.  She was initially diagnosed with atrial fibrillation on March 25 her PCP.  Eliquis  was prescribed.  Toprol  was increased for rate control.  She was seen in cardiology clinic and in atrial fibrillation clinic.  She remained persistently in atrial fibrillation through April during those visits.  Rhythm control options were discussed, and the patient expressed interest in ablation over Tikosyn load.  Amiodarone  was started with plans for DC cardioversion.  She reports today that she is feeling fairly well.  She noticed palpitations in the 1990s --she was experiencing them rarely prior to moving to [place]  from [place].  They have been increasing over the past 2 or 3 years.  Her palpitations are mild now.  She does have some dyspnea and shortness of breath as well.   She underwent ablation for atrial fibrillation on February 16, 2023 with pulsed Marketing executive.  We ablated the pulmonary veins and the posterior wall with pulsed field energy.  She had an ER visit in January 2025 for floaters reported to be SVT by EMS.  She converted to sinus rhythm with 6 mg adenosine.  She had another episode Sep 15, 2023.  She was again treated with adenosine and converted to sinus rhythm.  EKGs/Labs/Other Studies Reviewed Today:    Echocardiogram:  TTE 08/18/22 Ejection fraction 60 to 65%.  Left atrium is mildly dilated. Mild MR, otherwise normal structure and function.    Monitors:   Stress testing:   Advanced imaging:   Cardiac  catherization    EKG:  Last EKG results: today - atrial fibrillation   Recent Labs: 09/15/2023: BUN 17; Creatinine, Ser 0.70; Hemoglobin 13.4; Magnesium 1.9; Platelets 291; Potassium 3.6; Sodium 140     Physical Exam:    VS:  BP 114/76 (BP Location: Right Arm, Patient Position: Sitting, Cuff Size: Normal)   Pulse 70   Ht 5\' 2"  (1.575 m)   Wt 132 lb (59.9 kg)   LMP  (LMP Unknown)   SpO2 96%   BMI 24.14 kg/m     Wt Readings from Last 3 Encounters:  09/29/23 132 lb (59.9 kg)  09/15/23 131 lb (59.4 kg)  08/18/23 134 lb (60.8 kg)     GEN:  Well nourished, well developed in no acute distress CARDIAC: iRRR, no murmurs, rubs, gallops RESPIRATORY:  Normal work of breathing MUSCULOSKELETAL: no edema    ASSESSMENT & PLAN:    Persistent atrial fibrillation I suspect she has had paroxysmal atrial fibrillation dating back to the early 90s She is symptomatic with palpitations, fatigue, shortness of breath No evidence of A-fib recurrence since ablation in October 2024  Regular tachycardia 2 episodes --January 2025 in May 2025 Both episodes converted to sinus rhythm with adenosine 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  I recommended EP study with potential ablation. We will treat this as possible AVNRT with light sedation and then a full EP study.  Will need to be prepared to transition to general anesthesia and to  go left-sided if we induce an atypical flutter in the setting of prior ablation --though this is unlikely given the rhythm's consistent response to adenosine.  We discussed the indication, rationale, logistics, anticipated benefits, and potential risks of the ablation procedure including but not limited to -- bleed at the groin access site, chest pain, damage to nearby organs such as the diaphragm, lungs, or esophagus, need for a drainage tube, or prolonged hospitalization. I explained that the risk for stroke, heart attack, need for  open chest surgery, or even death is very low but not zero. she  expressed understanding and wishes to proceed.   Secondary hypercoagulable state Continue apixaban  5 mg BID  Descending thoracic aortic aneurysm She has an appoint with vascular surgery in October        Medication Adjustments/Labs and Tests Ordered: Current medicines are reviewed at length with the patient today.  Concerns regarding medicines are outlined above.  Orders Placed This Encounter  Procedures   EKG 12-Lead   No orders of the defined types were placed in this encounter.    Signed, Efraim Grange, MD  09/29/2023 12:23 PM    [facility]

## 2023-10-03 ENCOUNTER — Encounter: Payer: Self-pay | Admitting: Family Medicine

## 2023-10-03 ENCOUNTER — Ambulatory Visit: Admitting: Family Medicine

## 2023-10-03 VITALS — BP 98/64 | HR 87 | Temp 98.5°F | Ht 62.0 in | Wt 131.1 lb

## 2023-10-03 DIAGNOSIS — N139 Obstructive and reflux uropathy, unspecified: Secondary | ICD-10-CM

## 2023-10-03 DIAGNOSIS — R197 Diarrhea, unspecified: Secondary | ICD-10-CM | POA: Diagnosis not present

## 2023-10-03 DIAGNOSIS — K5790 Diverticulosis of intestine, part unspecified, without perforation or abscess without bleeding: Secondary | ICD-10-CM | POA: Diagnosis not present

## 2023-10-03 DIAGNOSIS — R300 Dysuria: Secondary | ICD-10-CM | POA: Diagnosis not present

## 2023-10-03 DIAGNOSIS — R600 Localized edema: Secondary | ICD-10-CM

## 2023-10-03 LAB — URINALYSIS, ROUTINE W REFLEX MICROSCOPIC
Bilirubin Urine: NEGATIVE
Hyaline Cast: NONE SEEN /LPF
Ketones, ur: NEGATIVE
Nitrite: POSITIVE — AB
Specific Gravity, Urine: 1.02 (ref 1.001–1.035)
pH: 5 (ref 5.0–8.0)

## 2023-10-03 LAB — MICROSCOPIC MESSAGE

## 2023-10-03 MED ORDER — NITROFURANTOIN MONOHYD MACRO 100 MG PO CAPS: 100.0000 mg | ORAL_CAPSULE | Freq: Two times a day (BID) | ORAL | 0 refills | Status: DC

## 2023-10-03 MED ORDER — FUROSEMIDE 40 MG PO TABS: 40.0000 mg | ORAL_TABLET | ORAL | 0 refills | Status: DC | PRN

## 2023-10-03 NOTE — Progress Notes (Signed)
 Patient Office Visit  Assessment & Plan:   Dysuria -     Urinalysis, Routine w reflex microscopic -     Nitrofurantoin  Monohyd Macro; Take 1 capsule (100 mg total) by mouth 2 (two) times daily.  Dispense: 14 capsule; Refill: 0 -     Urine Culture; Future  Diarrhea, unspecified type -     Comprehensive metabolic panel with GFR -     CBC with Differential/Platelet  Diverticulosis  Localized edema -     Furosemide ; Take 1 tablet (40 mg total) by mouth as needed for edema.  Dispense: 90 tablet; Refill: 0  Other orders -     Microscopic Message   Assessment and Plan    Diarrhea Acute diarrhea likely food-related. Risk of dehydration due to frequency and volume. - Order blood work to check for elevated white cell count. - Advise hydration to prevent dehydration.  Urinary tract infection Dysuria and perineal irritation suggest UTI. Cranberry supplements and Azo provide partial relief. - Check urine sample for infection. - Advise increased water intake.  Supraventricular tachycardia (SVT) Recurrent SVT episodes post-atrial fibrillation ablation. Scheduled for SVT ablation in July. Managed with flecainide . - Continue flecainide  for SVT symptoms. - Proceed with scheduled ablation in July.     Follow up on blood work and Urine culture.       No follow-ups on file.   Subjective:     Patient ID: Terri Alexander, female    DOB: [birth year]  Age: 73 y.o. MRN: [id number]  Chief Complaint  Patient presents with   Dysuria    Started on Friday. Has gotten worse over the weekend.    Diarrhea    Started yesterday.    Dysuria   Diarrhea    Discussed the use of AI scribe software for clinical note transcription with the patient, who gave verbal consent to proceed.  History of Present Illness   Terri Alexander is a 73 year old female who presents with urinary tract infection and diarrhea.  She has been experiencing severe diarrhea that began after eating out, with  episodes occurring approximately seven to eight times before she stopped counting. The diarrhea persisted until 5:45 AM on the day of the visit, described as 'just water' coming out. She suspects the cause might be related to eating at [employer], noting that she had a similar experience two months ago after eating out. She took two doses of anti-diarrheal medication, Merrem, which provided temporary relief. No fever or chills, but she experienced clamminess. No one else at home is sick.  She reports symptoms of a urinary tract infection, including burning during urination that started the previous night. She increased her water intake and took cranberry supplements when she first noticed symptoms on Friday. She later purchased Azo from [facility], which provided some relief, but the onset of diarrhea made it difficult to discern its effectiveness. She has not had a urinary tract infection in years and denies any history of kidney stones. She noticed a small amount of blood in her urine about a week ago, but it was only 'a little flex' and she did not find it concerning. Her urine is currently orange due to Azo use, but was clear before taking the medication.  She has a history of heart issues, including atrial fibrillation for which she had an ablation, and is currently experiencing supraventricular tachycardia. She has had three SVT episodes since January, with the most recent one in May requiring EMS intervention. She was prescribed  flecainide  to take if she feels SVT symptoms. She does not take Lasix  regularly, only using it as needed for weight gain related to her heart condition. Her weight has been stable, with a slight decrease noted recently.     Physical Exam MEASUREMENTS: Weight- 131. ABDOMEN: Abdomen soft and non-tender. Assessment & Plan Diarrhea Acute diarrhea likely food-related. Risk of dehydration due to frequency and volume. - Order blood work to check for elevated white cell count. - Advise  hydration to prevent dehydration.  Urinary tract infection Dysuria and perineal irritation suggest UTI. Cranberry supplements and Azo provide partial relief. Macrobid  sent to pharmacy. Will follow up on urine culture - Check urine sample for infection. - Advise increased water intake.  Supraventricular tachycardia (SVT) Recurrent SVT episodes post-atrial fibrillation ablation. Scheduled for SVT ablation in July. Managed with flecainide  per cardiology if [facility]. - Will take flecainide  for SVT symptoms if needed per cardiology. - Proceed with scheduled ablation in July.     The 10-year ASCVD risk score (Arnett DK, et al., 2019) is: 7.5%  Past Medical History:  Diagnosis Date   Adenomatous colon polyp    Allergy    Anemia    as a child, no current problem   Atrial fibrillation (HCC)    Blood transfusion without reported diagnosis    72 months old   Cataract    bilateral - MD just watching    GERD (gastroesophageal reflux disease)    Hyperlipidemia    Osteoporosis 04/15/2016   Skin cancer 2013   Skin cancer- Nose, face   Past Surgical History:  Procedure Laterality Date   ATRIAL FIBRILLATION ABLATION N/A 02/16/2023   Procedure: ATRIAL FIBRILLATION ABLATION;  Surgeon: Efraim Grange, MD;  [facility];  Service: Cardiovascular;  Laterality: N/A;   CARDIOVERSION N/A 09/09/2022   Procedure: CARDIOVERSION;  Surgeon: Bridgette Campus, MD;  Location: [facility];  Service: Cardiovascular;  Laterality: N/A;   CESAREAN SECTION  1981   x 1   COLONOSCOPY  2016   hx polyp, tics, sigmoid colon mod tortuous Dr Thersa Flavors,    EYE SURGERY Bilateral    retina repair x 2   LAPAROTOMY  1979   removal of ovarian cyst   nissan fundiplication  05/17/1997   REPAIR OF COMPLEX TRACTION RETINAL DETACHMENT Bilateral 01/2019   Social History   Tobacco Use   Smoking status: Never   Smokeless tobacco: Never  Vaping Use   Vaping status: Never Used  Substance Use Topics   Alcohol  use: Yes    Alcohol/week: 1.0 standard drink of alcohol    Types: 1 Glasses of wine per week    Comment: rarely   Drug use: No   Family History  Problem Relation Age of Onset   Arthritis Mother    Heart disease Mother    Vision loss Mother    Stroke Mother    Depression Sister    Transient ischemic attack Sister 25   Depression Sister    Cancer Maternal Grandmother    Cancer Maternal Grandfather    Early death Maternal Grandfather    Heart disease Maternal Grandfather    Early death Paternal Grandfather    Heart disease Paternal Grandfather    Diabetes Other        Juvenile   Colon cancer Neg Hx    Rectal cancer Neg Hx    Prostate cancer Neg Hx    No Known Allergies  Review of Systems  Gastrointestinal:  Positive for diarrhea.  Genitourinary:  Positive for dysuria.      Objective:    BP 98/64   Pulse 87   Temp 98.5 F (36.9 C)   Ht 5\' 2"  (1.575 m)   Wt 131 lb 2 oz (59.5 kg)   LMP  (LMP Unknown)   SpO2 99%   BMI 23.98 kg/m  BP Readings from Last 3 Encounters:  10/03/23 98/64  09/29/23 114/76  09/15/23 121/63   Wt Readings from Last 3 Encounters:  10/03/23 131 lb 2 oz (59.5 kg)  09/29/23 132 lb (59.9 kg)  09/15/23 131 lb (59.4 kg)    Physical Exam Vitals and nursing note reviewed.  Constitutional:      Appearance: Normal appearance.  HENT:     Head: Normocephalic.     Right Ear: Tympanic membrane, ear canal and external ear normal.     Left Ear: Tympanic membrane, ear canal and external ear normal.  Eyes:     Extraocular Movements: Extraocular movements intact.     Pupils: Pupils are equal, round, and reactive to light.  Cardiovascular:     Rate and Rhythm: Normal rate and regular rhythm.     Heart sounds: Normal heart sounds.  Pulmonary:     Effort: Pulmonary effort is normal.     Breath sounds: Normal breath sounds.  Abdominal:     General: Bowel sounds are normal.     Tenderness: There is no abdominal tenderness. There is no right CVA  tenderness, left CVA tenderness, guarding or rebound.  Musculoskeletal:     Right lower leg: No edema.     Left lower leg: No edema.  Neurological:     General: No focal deficit present.     Mental Status: She is alert and oriented to person, place, and time.  Psychiatric:        Mood and Affect: Mood normal.        Behavior: Behavior normal.        Thought Content: Thought content normal.        Judgment: Judgment normal.      Results for orders placed or performed in visit on 10/03/23  Urinalysis, Routine w reflex microscopic  Result Value Ref Range   Color, Urine ORANGE (A) YELLOW   APPearance SLIGHTLY CLOUDY (A) CLEAR   Specific Gravity, Urine 1.020 1.001 - 1.035   pH 5.0 5.0 - 8.0   Glucose, UA TRACE (A) NEGATIVE   Bilirubin Urine NEGATIVE NEGATIVE   Ketones, ur NEGATIVE NEGATIVE   Hgb urine dipstick TRACE (A) NEGATIVE   Protein, ur 1+ (A) NEGATIVE   Nitrite POSITIVE (A) NEGATIVE   Leukocytes,Ua TRACE (A) NEGATIVE   WBC, UA 0-5 0 - 5 /HPF   RBC / HPF 0-2 0 - 2 /HPF   Squamous Epithelial / HPF 0-5 < OR = 5 /HPF   Bacteria, UA FEW (A) NONE SEEN /HPF   Hyaline Cast NONE SEEN NONE SEEN /LPF  Microscopic Message  Result Value Ref Range   Note

## 2023-10-04 ENCOUNTER — Ambulatory Visit: Payer: Self-pay | Admitting: Family Medicine

## 2023-10-04 ENCOUNTER — Other Ambulatory Visit: Payer: Self-pay | Admitting: Family Medicine

## 2023-10-04 DIAGNOSIS — R600 Localized edema: Secondary | ICD-10-CM

## 2023-10-04 LAB — CBC WITH DIFFERENTIAL/PLATELET
Absolute Lymphocytes: 1166 {cells}/uL (ref 850–3900)
Absolute Monocytes: 969 {cells}/uL — ABNORMAL HIGH (ref 200–950)
Basophils Absolute: 39 {cells}/uL (ref 0–200)
Basophils Relative: 0.3 %
Eosinophils Absolute: 92 {cells}/uL (ref 15–500)
Eosinophils Relative: 0.7 %
HCT: 46.8 % — ABNORMAL HIGH (ref 35.0–45.0)
Hemoglobin: 15 g/dL (ref 11.7–15.5)
MCH: 30 pg (ref 27.0–33.0)
MCHC: 32.1 g/dL (ref 32.0–36.0)
MCV: 93.6 fL (ref 80.0–100.0)
MPV: 10.3 fL (ref 7.5–12.5)
Monocytes Relative: 7.4 %
Neutro Abs: 10834 {cells}/uL — ABNORMAL HIGH (ref 1500–7800)
Neutrophils Relative %: 82.7 %
Platelets: 314 10*3/uL (ref 140–400)
RBC: 5 10*6/uL (ref 3.80–5.10)
RDW: 12.9 % (ref 11.0–15.0)
Total Lymphocyte: 8.9 %
WBC: 13.1 10*3/uL — ABNORMAL HIGH (ref 3.8–10.8)

## 2023-10-04 LAB — COMPREHENSIVE METABOLIC PANEL WITH GFR
AG Ratio: 1.6 (calc) (ref 1.0–2.5)
ALT: 23 U/L (ref 6–29)
AST: 19 U/L (ref 10–35)
Albumin: 4.6 g/dL (ref 3.6–5.1)
Alkaline phosphatase (APISO): 84 U/L (ref 37–153)
BUN/Creatinine Ratio: 23 (calc) — ABNORMAL HIGH (ref 6–22)
BUN: 26 mg/dL — ABNORMAL HIGH (ref 7–25)
CO2: 26 mmol/L (ref 20–32)
Calcium: 10.2 mg/dL (ref 8.6–10.4)
Chloride: 102 mmol/L (ref 98–110)
Creat: 1.13 mg/dL — ABNORMAL HIGH (ref 0.60–1.00)
Globulin: 2.8 g/dL (ref 1.9–3.7)
Glucose, Bld: 120 mg/dL — ABNORMAL HIGH (ref 65–99)
Potassium: 4.7 mmol/L (ref 3.5–5.3)
Sodium: 140 mmol/L (ref 135–146)
Total Bilirubin: 0.5 mg/dL (ref 0.2–1.2)
Total Protein: 7.4 g/dL (ref 6.1–8.1)
eGFR: 51 mL/min/{1.73_m2} — ABNORMAL LOW (ref >=60)

## 2023-10-04 LAB — URINE CULTURE
MICRO NUMBER:: 16472480
SPECIMEN QUALITY:: ADEQUATE

## 2023-10-05 NOTE — Telephone Encounter (Signed)
 Requested medication (s) are due for refill today: yes  Requested medication (s) are on the active medication list: yes  Last refill:  10/03/23 #90 0 refills  Future visit scheduled: no  Notes to clinic:  Pharmacy comment: The frequency is missing on the original prescription for (FUROSEMIDE  TAB 40MG ) dated (10/03/23). Please respond with appropriate frequency or comment to Pharmacy. The frequency is missing on the original prescription for (FUROSEMIDE  TAB 40MG ) dated (10/03/23). Please respond with appropriate frequency or comment to Pharmacy.  Please advise. Last ordered by  Dr. Ferna How     Requested Prescriptions  Pending Prescriptions Disp Refills   furosemide  (LASIX ) 40 MG tablet [Pharmacy Med Name: FUROSEMIDE  (LASIX ) 40 MG TABLET] 90 tablet 0    Sig: Take 1 tablet (40 mg total) by mouth as needed for edema.     Cardiovascular:  Diuretics - Loop Failed - 10/05/2023  3:54 PM      Failed - Cr in normal range and within 180 days    Creat  Date Value Ref Range Status  10/03/2023 1.13 (H) 0.60 - 1.00 mg/dL Final         Passed - K in normal range and within 180 days    Potassium  Date Value Ref Range Status  10/03/2023 4.7 3.5 - 5.3 mmol/L Final         Passed - Ca in normal range and within 180 days    Calcium  Date Value Ref Range Status  10/03/2023 10.2 8.6 - 10.4 mg/dL Final         Passed - Na in normal range and within 180 days    Sodium  Date Value Ref Range Status  10/03/2023 140 135 - 146 mmol/L Final  02/03/2023 143 134 - 144 mmol/L Final         Passed - Cl in normal range and within 180 days    Chloride  Date Value Ref Range Status  10/03/2023 102 98 - 110 mmol/L Final         Passed - Mg Level in normal range and within 180 days    Magnesium  Date Value Ref Range Status  09/15/2023 1.9 1.7 - 2.4 mg/dL Final    Comment:    Performed at [facility], [address] [place], [place] [address]         Passed - Last BP in normal range    BP  Readings from Last 1 Encounters:  10/03/23 98/64         Passed - Valid encounter within last 6 months    Recent Outpatient Visits           2 days ago Dysuria   [place] [facility] Amadeo June, MD   3 months ago Muscle spasm   [place] [facility] Cheril Cork, Cisco Crest, MD   1 year ago Dissection of descending thoracic aorta [facility])   [place] [facility] Austine Lefort, MD   1 year ago Atrial fibrillation, unspecified type [facility])   [place] [facility] Austine Lefort, MD   1 year ago Irregular heart rhythm   [place] [facility] Pickard, Cisco Crest, MD

## 2023-10-06 ENCOUNTER — Other Ambulatory Visit: Payer: Self-pay

## 2023-10-06 DIAGNOSIS — R600 Localized edema: Secondary | ICD-10-CM

## 2023-10-06 MED ORDER — FUROSEMIDE 40 MG PO TABS: 40.0000 mg | ORAL_TABLET | ORAL | 0 refills | Status: DC | PRN

## 2023-10-11 ENCOUNTER — Telehealth: Payer: Self-pay

## 2023-10-11 NOTE — Telephone Encounter (Signed)
 Copied from CRM [id number]. Topic: Clinical - Prescription Issue >> Oct 11, 2023  3:39 PM Carlatta H wrote: Reason for CRM: Advised of prescription furosemide  (LASIX ) 40 MG tablet [[id number]] directions//Please call back [phone] to advise of prescription quantity//

## 2023-11-09 DIAGNOSIS — I4819 Other persistent atrial fibrillation: Secondary | ICD-10-CM | POA: Diagnosis not present

## 2023-11-10 ENCOUNTER — Ambulatory Visit: Payer: Self-pay | Admitting: Cardiovascular Disease

## 2023-11-10 LAB — CBC
Hematocrit: 42.7 % (ref 34.0–46.6)
Hemoglobin: 13.6 g/dL (ref 11.1–15.9)
MCH: 29.8 pg (ref 26.6–33.0)
MCHC: 31.9 g/dL (ref 31.5–35.7)
MCV: 94 fL (ref 79–97)
Platelets: 335 10*3/uL (ref 150–450)
RBC: 4.56 x10E6/uL (ref 3.77–5.28)
RDW: 12.6 % (ref 11.7–15.4)
WBC: 8.2 10*3/uL (ref 3.4–10.8)

## 2023-11-10 LAB — BASIC METABOLIC PANEL WITH GFR
BUN/Creatinine Ratio: 29 — ABNORMAL HIGH (ref 12–28)
BUN: 21 mg/dL (ref 8–27)
CO2: 24 mmol/L (ref 20–29)
Calcium: 9.5 mg/dL (ref 8.7–10.3)
Chloride: 99 mmol/L (ref 96–106)
Creatinine, Ser: 0.72 mg/dL (ref 0.57–1.00)
Glucose: 90 mg/dL (ref 70–99)
Potassium: 5.5 mmol/L — ABNORMAL HIGH (ref 3.5–5.2)
Sodium: 139 mmol/L (ref 134–144)
eGFR: 88 mL/min/{1.73_m2} (ref >59)

## 2023-11-11 NOTE — Telephone Encounter (Signed)
 Spoke with patient, with prior history of 2 ED visits this year needing adenosine to convert back to NSR SVT ablation would be recommended. Patient agrees episodes cannot be predicted and would like to keep SVT ablation scheduled for 12/09/23 - no further questions or needs at this time

## 2023-11-14 ENCOUNTER — Ambulatory Visit (INDEPENDENT_AMBULATORY_CARE_PROVIDER_SITE_OTHER): Admitting: Family Medicine

## 2023-11-14 VITALS — BP 118/74 | HR 64 | Temp 98.4°F | Ht 62.0 in | Wt 137.4 lb

## 2023-11-14 DIAGNOSIS — M62830 Muscle spasm of back: Secondary | ICD-10-CM

## 2023-11-14 DIAGNOSIS — I71019 Dissection of thoracic aorta, unspecified: Secondary | ICD-10-CM | POA: Diagnosis not present

## 2023-11-14 MED ORDER — CYCLOBENZAPRINE HCL 10 MG PO TABS: 10.0000 mg | ORAL_TABLET | Freq: Three times a day (TID) | ORAL | 1 refills | Status: AC | PRN

## 2023-11-14 MED ORDER — HYDROCODONE-ACETAMINOPHEN 5-325 MG PO TABS: 1.0000 | ORAL_TABLET | Freq: Four times a day (QID) | ORAL | 0 refills | Status: DC | PRN

## 2023-11-14 NOTE — Progress Notes (Unsigned)
 Patient Office Visit  Assessment & Plan:  Spasm of back muscles -     Cyclobenzaprine  HCl; Take 1 tablet (10 mg total) by mouth 3 (three) times daily as needed for muscle spasms.  Dispense: 30 tablet; Refill: 1 -     HYDROcodone-Acetaminophen ; Take 1 tablet by mouth every 6 (six) hours as needed for moderate pain (pain score 4-6).  Dispense: 30 tablet; Refill: 0   Assessment and Plan       Upper Back Pain Moderate pain, 5-6/10, right side between shoulder blades. Pulsing, intermittent, worsens with movement. Muscle spasms likely due to repetitive activities. NSAIDs contraindicated due to blood thinner use. - Renew Flexeril  prescription for muscle relaxation. - Prescribe hydrocodone for short-term pain management. - Advise against NSAIDs due to blood thinner use. - Recommend pool exercises for relief. - Advise taking breaks during long drives.  Supraventricular Tachycardia (SVT) No recent episodes, condition well-managed. - Follow up with cardiologist on July 25th for SVT evaluation.  Aortic Dissection Monitored by cardiologist, well-managed, no significant changes since April.  General Health Maintenance On Pradaxa , advised to avoid NSAIDs. Encouraged physical activity. - Encourage regular physical activity, including pool exercises.        Return if symptoms worsen or fail to improve.   Subjective:    Patient ID: Terri Alexander, female    DOB: Oct 07, 1950  Age: 74 y.o. MRN: [id number]  Chief Complaint  Patient presents with   Back Pain    R sided back pain into shoulders, pt states it feels like muscle spasms x 4 days.    HPI Discussed the use of AI scribe software for clinical note transcription with the patient, who gave verbal consent to proceed.  History of Present Illness   Terri Alexander is a 73 year old female who presents with upper back pain, shoulder blade pain (right side is worse)  She has been experiencing upper back pain since last Thursday, which  has persisted through the weekend. The pain is moderate, rated at 5 to 6 out of 10, and is described as a pulsing sensation. It is primarily located on the right side between the shoulder blades and sometimes extends to the shoulder. The pain is intermittent and can be triggered by activities such as bending over or reaching up.  The pain can become severe enough to prevent her from sitting or lying down comfortably, necessitating movement to alleviate discomfort. She has been using Flexeril  (cyclobenzaprine ) as prescribed previously, but has been rationing the medication due to a limited supply. She has also been using Tylenol , Salonpas, aspirin, and Biofreeze for pain relief. Flexeril  has been helpful when taken as prescribed, but she has been taking half doses to conserve her supply.  She denies any unusual activities that might have triggered the pain, although she acknowledges that cleaning activities sometimes exacerbate it. No history of kidney stones and no blood in urine. Her sleep has not been significantly affected, and she has been able to rest. She has a history of muscle spasms and is currently on a blood thinner, which limits her use of certain pain medications like Advil.       Upper Back Pain Moderate pain, 5-6/10, right side between shoulder blades. Pulsing, intermittent, worsens with movement. Muscle spasms likely due to repetitive activities. NSAIDs contraindicated due to blood thinner use. - Renew Flexeril  prescription for muscle relaxation. - Prescribe hydrocodone for short-term pain management. - Advise against NSAIDs due to blood thinner use. - Recommend pool exercises  for relief. - Advise taking breaks during long drives.  Supraventricular Tachycardia (SVT) with history of Afib No recent episodes, condition well-managed. - Follow up with cardiologist on July 25th for SVT evaluation.  Aortic Dissection Monitored by cardiologist, well-managed, no significant changes since  April.  General Health Maintenance On Pradaxa , advised to avoid NSAIDs. Encouraged physical activity. - Encourage regular physical activity, including pool exercises.       The 10-year ASCVD risk score (Arnett DK, et al., 2019) is: 10.5%  Past Medical History:  Diagnosis Date   Adenomatous colon polyp    Allergy    Anemia    as a child, no current problem   Arrhythmia    Atrial fibrillation (HCC)    Blood transfusion without reported diagnosis @ 9 months   9 months old   Cataract    bilateral - MD just watching    GERD (gastroesophageal reflux disease)    Hyperlipidemia    Osteoporosis 04/15/2016   Skin cancer 2013   Skin cancer- Nose, face   Past Surgical History:  Procedure Laterality Date   ATRIAL FIBRILLATION ABLATION N/A 02/16/2023   Procedure: ATRIAL FIBRILLATION ABLATION;  Surgeon: [name], MD;  [facility]: [facility];  Service: Cardiovascular;  Laterality: N/A;   CARDIOVERSION N/A 09/09/2022   Procedure: CARDIOVERSION;  Surgeon: [name], MD;  [facility]: [facility];  Service: Cardiovascular;  Laterality: N/A;   CESAREAN SECTION  1981   x 1   CESAREAN SECTION     COLONOSCOPY  2016   hx polyp, tics, sigmoid colon mod tortuous Dr [name],    EYE SURGERY Bilateral    retina repair x 2   LAPAROTOMY  1979   removal of ovarian cyst   nissan fundiplication  05/17/1997   REPAIR OF COMPLEX TRACTION RETINAL DETACHMENT Bilateral 01/2019   Social History   Tobacco Use   Smoking status: Never   Smokeless tobacco: Never  Vaping Use   Vaping status: Never Used  Substance Use Topics   Alcohol use: Yes    Alcohol/week: 1.0 standard drink of alcohol    Types: 1 Glasses of wine per week    Comment: rarely   Drug use: No   Family History  Problem Relation Age of Onset   Arthritis Mother    Heart disease Mother    Vision loss Mother    Stroke Mother    Arrhythmia Mother    Depression Sister    Transient ischemic attack Sister 16    Depression Sister    Cancer Maternal Grandmother    Cancer Maternal Grandfather    Early death Maternal Grandfather    Heart disease Maternal Grandfather    Early death Paternal Grandfather    Heart disease Paternal Grandfather    Diabetes Other        Juvenile   Arrhythmia Maternal Aunt    Colon cancer Neg Hx    Rectal cancer Neg Hx    Prostate cancer Neg Hx    No Known Allergies  ROS    Objective:    BP 118/74   Pulse 64   Temp 98.4 F (36.9 C)   Ht 5' 2 (1.575 m)   Wt 137 lb 6 oz (62.3 kg)   LMP  (LMP Unknown)   SpO2 98%   BMI 25.13 kg/m  BP Readings from Last 3 Encounters:  11/14/23 118/74  10/03/23 98/64  09/29/23 114/76   Wt Readings from Last 3 Encounters:  11/14/23 137  lb 6 oz (62.3 kg)  10/03/23 131 lb 2 oz (59.5 kg)  09/29/23 132 lb (59.9 kg)    Physical Exam Vitals and nursing note reviewed.  Constitutional:      Appearance: Normal appearance.  HENT:     Head: Normocephalic.     Right Ear: Tympanic membrane, ear canal and external ear normal.     Left Ear: Tympanic membrane, ear canal and external ear normal.   Eyes:     Extraocular Movements: Extraocular movements intact.     Pupils: Pupils are equal, round, and reactive to light.    Cardiovascular:     Rate and Rhythm: Normal rate and regular rhythm.     Heart sounds: Normal heart sounds.  Pulmonary:     Effort: Pulmonary effort is normal.     Breath sounds: Normal breath sounds.   Musculoskeletal:     Thoracic back: Spasms and tenderness present. Normal range of motion. No scoliosis.     Right lower leg: No edema.     Left lower leg: No edema.     Comments: Tender over scapular area, palpable spasm. Pt has good range of motion    Neurological:     General: No focal deficit present.     Mental Status: She is alert and oriented to person, place, and time.   Psychiatric:        Mood and Affect: Mood normal.        Behavior: Behavior normal.        Thought Content: Thought content  normal.        Judgment: Judgment normal.      No results found for any visits on 11/14/23.

## 2023-12-02 ENCOUNTER — Telehealth (HOSPITAL_COMMUNITY): Payer: Self-pay

## 2023-12-02 NOTE — Telephone Encounter (Signed)
 Spoke with patient to discuss upcoming procedure.   Labs: completed.   Any recent signs of acute illness or been started on antibiotics? Treated with Abx for UTI approximately 3 weeks ago. Reports symptoms has resolved.  Any medications to hold?  Hold Metoprolol  and Flecainide  for 5 days prior to procedure - last dose on July 19. Any missed doses of blood thinner? No Advised patient to continue taking ANTICOAGULANT: Pradaxa  (Dabigatran ) twice daily without missing any doses.  Medication instructions:  On the morning of your procedure DO NOT take any medication., including Pradaxa  or the procedure may be rescheduled. Nothing to eat or drink after midnight prior to your procedure.  Confirmed patient is scheduled for SVT Ablation on Friday, July 25 with Dr. Eulas Furbish. Instructed patient to arrive at the Main Entrance A at [facility]: [address] [place], [place] [address] and check in at Admitting at 8:00 AM.  Advised of plan to go home the same day and will only stay overnight if medically necessary. You MUST have a responsible adult to drive you home and MUST be with you the first 24 hours after you arrive home or your procedure could be cancelled.  Patient verbalized understanding to all instructions provided and agreed to proceed with procedure.

## 2023-12-08 NOTE — Pre-Procedure Instructions (Signed)
 Instructed patient on the following items: Arrival time 0800 Nothing to eat or drink after midnight No meds AM of procedure Responsible person to drive you home and stay with you for 24 hrs  Have you missed any doses of anti-coagulant Pradaxa - takes twice a day, hasn't missed any doses.  Don't take dose morning of procedure..  Last dose of Metoprolol  & Flecanide was 7/19

## 2023-12-09 ENCOUNTER — Other Ambulatory Visit: Payer: Self-pay

## 2023-12-09 ENCOUNTER — Ambulatory Visit (HOSPITAL_COMMUNITY): Payer: Self-pay | Admitting: Anesthesiology

## 2023-12-09 ENCOUNTER — Encounter (HOSPITAL_COMMUNITY)
Admission: RE | Disposition: A | Discharge status: Home / Self Care | Payer: Self-pay | Source: Ambulatory Visit | Attending: Cardiovascular Disease

## 2023-12-09 ENCOUNTER — Ambulatory Visit (HOSPITAL_COMMUNITY)
Admission: RE | Admit: 2023-12-09 | Discharge: 2023-12-09 | Disposition: A | Discharge status: Home / Self Care | Source: Ambulatory Visit | Attending: Cardiovascular Disease | Admitting: Cardiovascular Disease

## 2023-12-09 DIAGNOSIS — Z79899 Other long term (current) drug therapy: Secondary | ICD-10-CM | POA: Diagnosis not present

## 2023-12-09 DIAGNOSIS — I4719 Other supraventricular tachycardia: Secondary | ICD-10-CM | POA: Diagnosis not present

## 2023-12-09 DIAGNOSIS — I4819 Other persistent atrial fibrillation: Secondary | ICD-10-CM | POA: Insufficient documentation

## 2023-12-09 DIAGNOSIS — D6869 Other thrombophilia: Secondary | ICD-10-CM | POA: Diagnosis not present

## 2023-12-09 DIAGNOSIS — I4891 Unspecified atrial fibrillation: Secondary | ICD-10-CM | POA: Diagnosis not present

## 2023-12-09 DIAGNOSIS — I471 Supraventricular tachycardia, unspecified: Secondary | ICD-10-CM | POA: Diagnosis not present

## 2023-12-09 DIAGNOSIS — Z7901 Long term (current) use of anticoagulants: Secondary | ICD-10-CM | POA: Insufficient documentation

## 2023-12-09 DIAGNOSIS — I7123 Aneurysm of the descending thoracic aorta, without rupture: Secondary | ICD-10-CM | POA: Insufficient documentation

## 2023-12-09 DIAGNOSIS — E785 Hyperlipidemia, unspecified: Secondary | ICD-10-CM | POA: Diagnosis not present

## 2023-12-09 HISTORY — PX: SVT ABLATION: EP1225

## 2023-12-09 SURGERY — SVT ABLATION
Anesthesia: Monitor Anesthesia Care

## 2023-12-09 MED ORDER — BUPIVACAINE HCL (PF) 0.25 % IJ SOLN
INTRAMUSCULAR | Status: DC | PRN
  Administered 2023-12-09: 30 mL

## 2023-12-09 MED ORDER — ACETAMINOPHEN 325 MG PO TABS: 650.0000 mg | ORAL_TABLET | ORAL | Status: DC | PRN

## 2023-12-09 MED ORDER — SODIUM CHLORIDE 0.9 % IV SOLN
250.0000 mL | INTRAVENOUS | Status: DC | PRN
Start: 2023-12-09 — End: 2023-12-09

## 2023-12-09 MED ORDER — ATROPINE SULFATE 1 MG/10ML IJ SOSY
PREFILLED_SYRINGE | INTRAMUSCULAR | Status: AC
  Filled 2023-12-09: qty 20

## 2023-12-09 MED ORDER — ISOPROTERENOL HCL 0.2 MG/ML IJ SOLN
INTRAVENOUS | Status: DC | PRN
  Administered 2023-12-09: 2 ug/min via INTRAVENOUS

## 2023-12-09 MED ORDER — SODIUM CHLORIDE 0.9% FLUSH
3.0000 mL | INTRAVENOUS | Status: DC | PRN
Start: 2023-12-09 — End: 2023-12-09

## 2023-12-09 MED ORDER — ONDANSETRON HCL 4 MG/2ML IJ SOLN: 4.0000 mg | Freq: Four times a day (QID) | INTRAMUSCULAR | Status: DC | PRN

## 2023-12-09 MED ORDER — FENTANYL CITRATE (PF) 100 MCG/2ML IJ SOLN
INTRAMUSCULAR | Status: AC
Start: 2023-12-09 — End: 2023-12-09
  Filled 2023-12-09: qty 2

## 2023-12-09 MED ORDER — LACTATED RINGERS IV SOLN: INTRAVENOUS | Status: DC | PRN

## 2023-12-09 MED ORDER — MIDAZOLAM HCL 2 MG/2ML IJ SOLN
INTRAMUSCULAR | Status: AC
  Filled 2023-12-09: qty 2

## 2023-12-09 MED ORDER — MIDAZOLAM HCL 2 MG/2ML IJ SOLN
INTRAMUSCULAR | Status: DC | PRN
  Administered 2023-12-09: 1 mg via INTRAVENOUS
  Administered 2023-12-09 (×2): .5 mg via INTRAVENOUS

## 2023-12-09 MED ORDER — HEPARIN (PORCINE) IN NACL 1000-0.9 UT/500ML-% IV SOLN
INTRAVENOUS | Status: DC | PRN
Start: 2023-12-09 — End: 2023-12-09
  Administered 2023-12-09: 500 mL

## 2023-12-09 MED ORDER — SODIUM CHLORIDE 0.9 % IV SOLN: INTRAVENOUS | Status: DC

## 2023-12-09 MED ORDER — BUPIVACAINE HCL (PF) 0.25 % IJ SOLN
INTRAMUSCULAR | Status: AC
  Filled 2023-12-09: qty 30

## 2023-12-09 MED ORDER — ISOPROTERENOL HCL 0.2 MG/ML IJ SOLN
INTRAMUSCULAR | Status: AC
  Filled 2023-12-09: qty 5

## 2023-12-09 MED ORDER — FENTANYL CITRATE (PF) 100 MCG/2ML IJ SOLN
INTRAMUSCULAR | Status: DC | PRN
  Administered 2023-12-09 (×4): 25 ug via INTRAVENOUS

## 2023-12-09 SURGICAL SUPPLY — 11 items
BAG SNAP BAND KOVER 36X36 (MISCELLANEOUS) IMPLANT
CATH DECANAV D CURVE (CATHETERS) IMPLANT
CATH EZ STEER NAV 4MM D-F CUR (ABLATOR) IMPLANT
CATH JOSEPH QUAD ALLRED 6F REP (CATHETERS) IMPLANT
DEVICE CLOSURE MYNXGRIP 6/7F (Vascular Products) IMPLANT
PACK EP LF (CUSTOM PROCEDURE TRAY) ×1 IMPLANT
PAD DEFIB RADIO PHYSIO CONN (PAD) ×1 IMPLANT
PATCH CARTO3 (PAD) IMPLANT
SHEATH PINNACLE 6F 10CM (SHEATH) IMPLANT
SHEATH PINNACLE 8F 10CM (SHEATH) IMPLANT
SHEATH PROBE COVER 6X72 (BAG) IMPLANT

## 2023-12-09 NOTE — Anesthesia Preprocedure Evaluation (Addendum)
 Anesthesia Evaluation  Patient identified by MRN, date of birth, ID band Patient awake    Reviewed: Allergy & Precautions, NPO status , Patient's Chart, lab work & pertinent test results, reviewed documented beta blocker date and time   Airway Mallampati: III  TM Distance: >3 FB Neck ROM: Full    Dental no notable dental hx. (+) Teeth Intact, Dental Advisory Given   Pulmonary neg pulmonary ROS   Pulmonary exam normal breath sounds clear to auscultation       Cardiovascular Normal cardiovascular exam+ dysrhythmias Atrial Fibrillation and Supra Ventricular Tachycardia  Rhythm:Regular Rate:Normal  TTE 2024 1. Left ventricular ejection fraction, by estimation, is 60 to 65%. The  left ventricle has normal function. The left ventricle has no regional  wall motion abnormalities. Left ventricular diastolic function could not  be evaluated.   2. Right ventricular systolic function is normal. The right ventricular  size is normal. There is normal pulmonary artery systolic pressure. The  estimated right ventricular systolic pressure is 26.5 mmHg.   3. Left atrial size was mild to moderately dilated.   4. Right atrial size was mildly dilated.   5. The mitral valve is normal in structure. Mild mitral valve  regurgitation. No evidence of mitral stenosis.   6. Tricuspid valve regurgitation is mild to moderate.   7. The aortic valve is tricuspid. Aortic valve regurgitation is not  visualized. No aortic stenosis is present.   8. The inferior vena cava is dilated in size with >50% respiratory  variability, suggesting right atrial pressure of 8 mmHg.     Neuro/Psych negative neurological ROS  negative psych ROS   GI/Hepatic Neg liver ROS,GERD  ,,  Endo/Other  negative endocrine ROS    Renal/GU negative Renal ROS  negative genitourinary   Musculoskeletal negative musculoskeletal ROS (+)    Abdominal   Peds  Hematology  (+) Blood  dyscrasia (pradaxa )   Anesthesia Other Findings   Reproductive/Obstetrics                              Anesthesia Physical Anesthesia Plan  ASA: 3  Anesthesia Plan: MAC   Post-op Pain Management: Minimal or no pain anticipated   Induction: Intravenous  PONV Risk Score and Plan: 2 and Propofol  infusion, Treatment may vary due to age or medical condition and Ondansetron   Airway Management Planned: Natural Airway  Additional Equipment:   Intra-op Plan:   Post-operative Plan:   Informed Consent: I have reviewed the patients History and Physical, chart, labs and discussed the procedure including the risks, benefits and alternatives for the proposed anesthesia with the patient or authorized representative who has indicated his/her understanding and acceptance.     Dental advisory given  Plan Discussed with: CRNA  Anesthesia Plan Comments:          Anesthesia Quick Evaluation

## 2023-12-09 NOTE — Discharge Instructions (Signed)

## 2023-12-09 NOTE — Progress Notes (Signed)
 Patient and husband was given discharge instructions. Both verbalized understanding.

## 2023-12-09 NOTE — Anesthesia Postprocedure Evaluation (Signed)
 Anesthesia Post Note  Patient: JANARA KLETT  Procedure(s) Performed: SVT ABLATION     Patient location during evaluation: Specials Recovery Anesthesia Type: MAC Level of consciousness: awake and alert Pain management: pain level controlled Vital Signs Assessment: post-procedure vital signs reviewed and stable Respiratory status: spontaneous breathing, nonlabored ventilation, respiratory function stable and patient connected to nasal cannula oxygen Cardiovascular status: blood pressure returned to baseline and stable Postop Assessment: no apparent nausea or vomiting Anesthetic complications: no   There were no known notable events for this encounter.  Last Vitals:  Vitals:   12/09/23 1230 12/09/23 1300  BP: (!) 143/68 (!) 143/65  Pulse: 84 86  Resp: (!) 27 15  Temp:    SpO2: 98% 98%    Last Pain:  Vitals:   12/09/23 1034  TempSrc: Axillary  PainSc:    Pain Goal:                   Khaila Velarde L Siearra Amberg

## 2023-12-09 NOTE — Transfer of Care (Signed)
 Immediate Anesthesia Transfer of Care Note  Patient: Terri Alexander  Procedure(s) Performed: SVT ABLATION  Patient Location: PACU  Anesthesia Type:MAC  Level of Consciousness: awake, alert , and oriented  Airway & Oxygen Therapy: Patient Spontanous Breathing and Patient connected to nasal cannula oxygen  Post-op Assessment: Report given to RN and Post -op Vital signs reviewed and stable  Post vital signs: Reviewed and stable  Last Vitals:  Vitals Value Taken Time  BP 132/58 12/09/23 10:02  Temp 36 1005  Pulse 81 12/09/23 10:07  Resp 14 12/09/23 10:07  SpO2 100 % 12/09/23 10:07  Vitals shown include unfiled device data.  Last Pain:  Vitals:   12/09/23 1004  TempSrc:   PainSc: 0-No pain         Complications: There were no known notable events for this encounter.

## 2023-12-09 NOTE — H&P (Signed)
 Electrophysiology Office Note:    Date:  12/09/2023   ID:  Terri Alexander, DOB 05-26-1950, MRN [id number]  PCP:  [name], MD   [facility] Providers Cardiologist:  [name], MD (Inactive) Electrophysiologist:  Eulas BRAVO Furbish, MD     Referring MD: No ref. provider found   History of Present Illness:    Terri Alexander is a 74 y.o. female with a hx listed below, significant for descending thoracic aneurysm, referred for arrhythmia management.  She was initially diagnosed with atrial fibrillation on March 25 her PCP.  Eliquis  was prescribed.  Toprol  was increased for rate control.  She was seen in cardiology clinic and in atrial fibrillation clinic.  She remained persistently in atrial fibrillation through April during those visits.  Rhythm control options were discussed, and the patient expressed interest in ablation over Tikosyn load.  Amiodarone  was started with plans for DC cardioversion.  She reports today that she is feeling fairly well.  She noticed palpitations in the 1990s --she was experiencing them rarely prior to moving to [place]  from [place].  They have been increasing over the past 2 or 3 years.  Her palpitations are mild now.  She does have some dyspnea and shortness of breath as well.   She underwent ablation for atrial fibrillation on February 16, 2023 with pulsed Marketing executive.  We ablated the pulmonary veins and the posterior wall with pulsed field energy.  She had an ER visit in January 2025 for floaters reported to be SVT by EMS.  She converted to sinus rhythm with 6 mg adenosine.  She had another episode Sep 15, 2023.  She was again treated with adenosine and converted to sinus rhythm.  She presents today for EP study and possible ablation of SVT. She hasn't had any changes in her diagnoses or condition since our last visit. I did explain that the puspose of this procedure is to address her SVT. If we pursue the SVT to the left side,  we will also evaluate the prior AF ablation. Otherwise, I will not plan to go to the left side of the heart, which would require general anesthesia and anticoagulation.   EKGs/Labs/Other Studies Reviewed Today:    Echocardiogram:  TTE 08/18/22 Ejection fraction 60 to 65%.  Left atrium is mildly dilated. Mild MR, otherwise normal structure and function.    Monitors:   Stress testing:   Advanced imaging:   Cardiac catherization    EKG:  Last EKG results: today - atrial fibrillation   Recent Labs: 09/15/2023: Magnesium 1.9 10/03/2023: ALT 23 11/09/2023: BUN 21; Creatinine, Ser 0.72; Hemoglobin 13.6; Platelets 335; Potassium 5.5; Sodium 139     Physical Exam:    VS:  BP (!) 167/75   Pulse 77   Temp 98.7 F (37.1 C) (Oral)   Resp 17   Ht 5' 2 (1.575 m)   Wt 61.2 kg   LMP  (LMP Unknown)   SpO2 98%   BMI 24.69 kg/m     Wt Readings from Last 3 Encounters:  12/09/23 61.2 kg  11/14/23 62.3 kg  10/03/23 59.5 kg     GEN:  Well nourished, well developed in no acute distress CARDIAC: iRRR, no murmurs, rubs, gallops RESPIRATORY:  Normal work of breathing MUSCULOSKELETAL: no edema    ASSESSMENT & PLAN:    Persistent atrial fibrillation I suspect she has had paroxysmal atrial fibrillation dating back to the early 90s She is symptomatic with palpitations, fatigue, shortness  of breath No evidence of A-fib recurrence since ablation in October 2024  Regular tachycardia 2 episodes --January 2025 in May 2025 Both episodes converted to sinus rhythm with adenosine 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  I recommended EP study with potential ablation. We will treat this as possible AVNRT with light sedation and then a full EP study.  Will need to be prepared to transition to general anesthesia and to go left-sided if we induce an atypical flutter in the setting of prior ablation --though this is unlikely given the rhythm's consistent  response to adenosine.  We discussed the indication, rationale, logistics, anticipated benefits, and potential risks of the ablation procedure including but not limited to -- bleed at the groin access site, chest pain, damage to nearby organs such as the diaphragm, lungs, or esophagus, need for a drainage tube, or prolonged hospitalization. I explained that the risk for stroke, heart attack, need for open chest surgery, or even death is very low but not zero. she  expressed understanding and wishes to proceed.   Secondary hypercoagulable state Continue apixaban  5 mg BID  Descending thoracic aortic aneurysm She has an appoint with vascular surgery in October        Medication Adjustments/Labs and Tests Ordered: Current medicines are reviewed at length with the patient today.  Concerns regarding medicines are outlined above.  Orders Placed This Encounter  Procedures   Informed Consent Details: Physician/Practitioner Attestation; Transcribe to consent form and obtain patient signature   Initiate Pre-op Protocol   Void on call to EP Lab   Confirm CBC and BMP (or CMP) results within 7 days for inpatient and 30 days for outpatient:   Clip right and left femoral area PM before surgery   Clip right internal jugular area PM before surgery   Pre-admission testing diagnosis   EP STUDY   Insert peripheral IV   Meds ordered this encounter  Medications   0.9 %  sodium chloride  infusion   midazolam (VERSED) 2 MG/2ML injection    Scherrie, Violeta: cabinet override     Signed, Eulas FORBES Furbish, MD  12/09/2023 7:21 AM    [facility]

## 2023-12-11 ENCOUNTER — Encounter (HOSPITAL_COMMUNITY): Payer: Self-pay | Admitting: Cardiovascular Disease

## 2023-12-12 ENCOUNTER — Telehealth (HOSPITAL_COMMUNITY): Payer: Self-pay

## 2023-12-12 NOTE — Telephone Encounter (Signed)
 Spoke with patient to complete post procedure follow up call.  Patient removed large bandage at puncture site and reports no complications with groin sites.   Instructions reviewed with patient:  It is normal to have bruising, tenderness, mild swelling, and a pea or marble sized lump/knot at the groin site which can take up to three months to resolve.  Get help right away if you notice sudden swelling at the puncture site.  Check your puncture site every day for signs of infection: fever, redness, swelling, pus drainage, warmth, foul odor or excessive pain. If this occurs, please call the office at [phone], to speak with the nurse. Get help right away if your puncture site is bleeding and the bleeding does not stop after applying firm pressure to the area.  You may continue to have skipped beats during the first several months after your procedure.  You will follow up with the APP on 01/09/24.   Patient verbalized understanding to all instructions provided.

## 2023-12-13 ENCOUNTER — Telehealth: Payer: Self-pay | Admitting: Nurse Practitioner

## 2023-12-13 NOTE — Telephone Encounter (Signed)
   Pt called via the answering service to report that earlier this afternoon, she noted sudden onset of palpitations.  She checked her BP, which was in the 90's systolic, and her HR was 144.  HR has since been trending in the 1-teens and is not particularly symptomatic at this time. She is recently s/p redo Afib ablation and is aware that PAF is not uncommon after the procedure.  She was previously on toprol  xl 12.5 mg daily prior to her ablation.  I advised that provided her SBP remains > , if she has recurrent, symptomatic tachycardia, she may resume toprol  12.5 mg daily and that I will reach out to the office to see if we can get her in sooner than currently scheduled appt.  Caller verbalized understanding and was grateful for the call back.  Lonni Meager, NP 12/13/2023, 5:50 PM

## 2023-12-16 ENCOUNTER — Ambulatory Visit (HOSPITAL_COMMUNITY)
Admission: RE | Admit: 2023-12-16 | Discharge: 2023-12-16 | Disposition: A | Discharge status: Home / Self Care | Source: Ambulatory Visit | Attending: Physician Assistant | Admitting: Physician Assistant

## 2023-12-16 ENCOUNTER — Encounter (HOSPITAL_COMMUNITY): Payer: Self-pay | Admitting: Physician Assistant

## 2023-12-16 VITALS — BP 122/80 | HR 69 | Ht 62.0 in | Wt 135.4 lb

## 2023-12-16 DIAGNOSIS — D6869 Other thrombophilia: Secondary | ICD-10-CM | POA: Insufficient documentation

## 2023-12-16 DIAGNOSIS — I4819 Other persistent atrial fibrillation: Secondary | ICD-10-CM | POA: Insufficient documentation

## 2023-12-16 MED ORDER — METOPROLOL SUCCINATE ER 25 MG PO TB24: 12.5000 mg | ORAL_TABLET | Freq: Every day | ORAL | Status: DC

## 2023-12-16 NOTE — Progress Notes (Signed)
 Primary Care Physician: Terri Butler DASEN, MD Primary Cardiologist: Dr. Alvan Primary Electrophysiologist: None Referring Physician:    [name] is a 73 y.o. female with a history of HLD, GERD, descending thoracic aortic aneurysm versus chronic type B aortic dissection, significant laminated thrombus within aneurysm, and persistent atrial fibrillation who presents for consultation in the [facility]. The patient was initially diagnosed with atrial fibrillation on 3/25 by PCP and was started on Eliquis  5 mg BID. Patient admits to not starting anticoagulation at that time yet. She saw PCP on f/u 3/28 and Toprol  was increased to 100 mg daily. She presented to the ED at [facility] on 4/2 with chest pain and shortness of breath. Review of records show she took two full doses of Eliquis  on 4/3. She did not have surgery for vascular finding, believed to be chronic, and has follow up with them outpatient for repeat CT. She was discharged on diltiazem  60 mg BID and Lopressor  25 mg BID. Patient is on Eliquis  5 mg BID for a CHADS2VASC score of 3.  On evaluation 08/25/22, she is currently in Afib. She states she does not really feel it at this time. She states it is not like when she had chest pain and shortness of breath on day of ED visit. She is transitioning to Dr. Alvan for new cardiologist. She feels tired when she does activity but otherwise in office feels okay. She does not drink alcohol. She does not snore or stop breathing per husband.   Mother and aunt with history of Afib.  On follow up 09/20/22, she is currently in Afib. She is s/p successful DCCV on 4/25 with conversion to NSR x 2 shocks. Saw Dr. Alvan on 5/2 and was back in Afib. Patient states she was in normal rhythm for about a day and a half. After discussion with Dr. Alvan, patient was considering Tikosyn load. Metoprolol  changed to Toprol  50 mg daily. Since office visit, she has felt mild chest pressure  over the weekend but has resolved. She doesn't really feel tired while in Afib. She has questions about Tikosyn and also has questions about ablation procedure.  On follow up 03/18/23, she is currently in NSR. S/p Afib ablation on 02/16/23 by Dr. Nancey. No episodes of Afib since ablation. She feels well overall. No chest pain, SOB, or trouble swallowing. Leg sites healed without issue. No missed doses of anticoagulant.   On follow up 05/24/23, she is currently in NSR. Recent ED visit on 05/19/23 for reported SVT by EMS. She noted sudden onset of palpitations when she bent over to clean up spilled dog food. The ECG in media tab appears to show SVT; notes show she converted to NSR with 6 mg adenosine and was in SR in ED. She is now 3 months post ablation. She is currently on Toprol  12.5 mg daily. No missed doses of Eliquis  5 mg BID.   Follow up 12/16/23. Patient returns for follow up for atrial fibrillation. She is s/p ablation for AVNRT 12/09/23. After her procedure, she was off metoprolol  and called in with elevated heart rates and tachypalpitations. Her metoprolol  was resumed at a lower dose and she has done well since that time. She is in SR today.   Today, she  denies symptoms of chest pain, shortness of breath, orthopnea, PND, lower extremity edema, dizziness, presyncope, syncope, snoring, daytime somnolence, bleeding, or neurologic sequela. The patient is tolerating medications without difficulties and is otherwise without complaint today.  Atrial Fibrillation Risk Factors:  she does not have symptoms or diagnosis of sleep apnea. she does not have a history of rheumatic fever. she does not have a history of alcohol use. The patient does have a history of early familial atrial fibrillation or other arrhythmias.  she has a BMI of Body mass index is 24.76 kg/m.[name] Filed Weights   12/16/23 0953  Weight: 61.4 kg    Family History  Problem Relation Age of Onset   Arthritis Mother    Heart disease  Mother    Vision loss Mother    Stroke Mother    Arrhythmia Mother    Depression Sister    Transient ischemic attack Sister 15   Depression Sister    Cancer Maternal Grandmother    Cancer Maternal Grandfather    Early death Maternal Grandfather    Heart disease Maternal Grandfather    Early death Paternal Grandfather    Heart disease Paternal Grandfather    Diabetes Other        Juvenile   Arrhythmia Maternal Aunt    Colon cancer Neg Hx    Rectal cancer Neg Hx    Prostate cancer Neg Hx      Atrial Fibrillation Management history:  Previous antiarrhythmic drugs: amiodarone  Previous cardioversions: 09/09/22 Previous ablations: 02/16/23, SVT 12/09/23 Anticoagulation history: Eliquis  5 mg BID    Past Medical History:  Diagnosis Date   Adenomatous colon polyp    Allergy    Anemia    as a child, no current problem   Arrhythmia    Atrial fibrillation (HCC)    Blood transfusion without reported diagnosis @ 9 months   9 months old   Cataract    bilateral - MD just watching    GERD (gastroesophageal reflux disease)    Hyperlipidemia    Osteoporosis 04/15/2016   Skin cancer 2013   Skin cancer- Nose, face   Past Surgical History:  Procedure Laterality Date   ATRIAL FIBRILLATION ABLATION N/A 02/16/2023   Procedure: ATRIAL FIBRILLATION ABLATION;  Surgeon: [name], MD;  Location: [facility]VASIVE CV LAB;  Service: Cardiovascular;  Laterality: N/A;   CARDIOVERSION N/A 09/09/2022   Procedure: CARDIOVERSION;  Surgeon: [name], MD;  Location: [facility]VAS[facility] CV [facility];  Service: Cardiovascular;  Laterality: N/A;   CESAREAN SECTION  1981   x 1   CESAREAN SECTION     COLONOSCOPY  2016   hx polyp, tics, sigmoid colon mod tortuous Dr [name],    EYE SURGERY Bilateral    retina repair x 2   LAPAROTOMY  1979   removal of ovarian cyst   nissan fundiplication  05/17/1997   REPAIR OF COMPLEX TRACTION RETINAL DETACHMENT Bilateral 01/2019   SVT ABLATION N/A 12/09/2023   Procedure:  SVT ABLATION;  Surgeon: [name], MD;  Location: [facility] INVAS[facility] CV LAB;  Service: Cardiovascular;  Laterality: N/A;    Current Outpatient Medications  Medication Sig Dispense Refill   acetaminophen  (TYLENOL ) 500 MG tablet Take 1,000 mg by mouth as needed for moderate pain (pain score 4-6).     alendronate  (FOSAMAX ) 70 MG tablet TAKE 1 TABLET BY MOUTH ONCE A WEEK ON AN EMPTY STOMACH WITH A FULL GLASS OF WATER 12 tablet 3   Ascorbic Acid (VITAMIN C) 1000 MG tablet Take 1,000 mg by mouth in the morning.     Biotin 1000 MCG tablet Take 1,000 mcg by mouth daily.     Calcium Carbonate-Vit D-Min (CALCIUM 1200 PO) Take 1 tablet by  mouth in the morning.     Calcium Polycarbophil (FIBER-CAPS PO) Take 1 capsule by mouth in the morning.     cetirizine (ZYRTEC) 10 MG tablet Take 10 mg by mouth at bedtime.     Cholecalciferol (VITAMIN D3) 125 MCG (5000 UT) TABS Take 5,000 Units by mouth in the morning.     Cranberry 500 MG CHEW Chew 500 mg by mouth in the morning.     cyclobenzaprine  (FLEXERIL ) 10 MG tablet Take 1 tablet (10 mg total) by mouth 3 (three) times daily as needed for muscle spasms. 30 tablet 1   dabigatran  (PRADAXA ) 150 MG CAPS capsule Take 1 capsule (150 mg total) by mouth 2 (two) times daily. 180 capsule 2   docusate sodium  (COLACE) 100 MG capsule Take 100 mg by mouth in the morning.     famotidine  (PEPCID ) 20 MG tablet Take 20 mg by mouth daily as needed for heartburn or indigestion.     furosemide  (LASIX ) 40 MG tablet Take 1 tablet (40 mg total) by mouth as needed for edema. 90 tablet 0   L-Lysine 500 MG CAPS Take 500 mg by mouth in the morning.     Lidocaine  (SALONPAS PAIN RELIEVING EX) Apply 1 Application topically daily as needed (pain).     Menthol-Methyl Salicylate (SALONPAS PAIN RELIEF PATCH EX) Apply 1 patch topically daily as needed (pain).     Multiple Vitamin (MULTIVITAMIN WITH MINERALS) TABS tablet Take 1 tablet by mouth in the morning.     omeprazole (PRILOSEC OTC) 20 MG  tablet Take 20 mg by mouth daily as needed (acid reflux).     Probiotic Product (PROBIOTIC PO) Take 1 tablet by mouth in the morning.     metoprolol  succinate (TOPROL  XL) 25 MG 24 hr tablet Take 0.5 tablets (12.5 mg total) by mouth daily. Take 12.5 mg (1/2 tablet) by mouth in the morning and 25 mg (1 tablet) in the evening     No current facility-administered medications for this encounter.     ROS- All systems are reviewed and negative except as per the HPI above.  Physical Exam: Vitals:   12/16/23 0953  BP: 122/80  Pulse: 69  Weight: 61.4 kg  Height: 5' 2 (1.575 m)    GEN: Well nourished, well developed in no acute distress CARDIAC: Regular rate and rhythm, no murmurs, rubs, gallops RESPIRATORY:  Clear to auscultation without rales, wheezing or rhonchi  ABDOMEN: Soft, non-tender, non-distended EXTREMITIES:  No edema; No deformity    Wt Readings from Last 3 Encounters:  12/16/23 61.4 kg  12/09/23 61.2 kg  11/14/23 62.3 kg    ECG today demonstrates  SR Vent. rate 69 BPM PR interval 194 ms QRS duration 84 ms QT/QTcB 398/426 ms   Echo 08/18/22 demonstrated:  1. Left ventricular ejection fraction, by estimation, is 60 to 65%. The  left ventricle has normal function. The left ventricle has no regional  wall motion abnormalities. Left ventricular diastolic function could not  be evaluated.   2. Right ventricular systolic function is normal. The right ventricular  size is normal. There is normal pulmonary artery systolic pressure. The  estimated right ventricular systolic pressure is 26.5 mmHg.   3. Left atrial size was mild to moderately dilated.   4. Right atrial size was mildly dilated.   5. The mitral valve is normal in structure. Mild mitral valve  regurgitation. No evidence of mitral stenosis.   6. Tricuspid valve regurgitation is mild to moderate.   7. The aortic  valve is tricuspid. Aortic valve regurgitation is not  visualized. No aortic stenosis is present.    8. The inferior vena cava is dilated in size with >50% respiratory  variability, suggesting right atrial pressure of 8 mmHg.   Epic records are reviewed at length today.  CHA2DS2-VASc Score = 3  The patient's score is based upon: CHF History: 0 HTN History: 0 Diabetes History: 0 Stroke History: 0 Vascular Disease History: 1 Age Score: 1 Gender Score: 1       ASSESSMENT AND PLAN: Persistent Atrial Fibrillation (ICD10:  I48.19) The patient's CHA2DS2-VASc score is 3, indicating a 3.2% annual risk of stroke.   S/p afib ablation 02/16/23 Patient currently in SR. ? If had some afib post procedure, now resolved.  Continue Toprol  12.5 mg daily Continue Pradaxa  150 mg BID  Secondary Hypercoagulable State (ICD10:  D68.69) The patient is at significant risk for stroke/thromboembolism based upon her CHA2DS2-VASc Score of 3.  Continue Dabigatran  (Pradaxa ). No bleeding issues.   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    Daril Kicks PA-C Afib Clinic [facility] [address] [place], [place] [address] [phone] 12/16/2023 12:01 PM

## 2023-12-28 ENCOUNTER — Ambulatory Visit (HOSPITAL_BASED_OUTPATIENT_CLINIC_OR_DEPARTMENT_OTHER)
Admission: RE | Admit: 2023-12-28 | Discharge: 2023-12-28 | Disposition: A | Discharge status: Home / Self Care | Source: Ambulatory Visit | Attending: Family Medicine | Admitting: Family Medicine

## 2023-12-28 DIAGNOSIS — Z780 Asymptomatic menopausal state: Secondary | ICD-10-CM | POA: Diagnosis not present

## 2023-12-28 DIAGNOSIS — M8589 Other specified disorders of bone density and structure, multiple sites: Secondary | ICD-10-CM | POA: Diagnosis not present

## 2023-12-28 DIAGNOSIS — M810 Age-related osteoporosis without current pathological fracture: Secondary | ICD-10-CM | POA: Insufficient documentation

## 2023-12-30 ENCOUNTER — Ambulatory Visit: Payer: Self-pay | Admitting: Family Medicine

## 2024-01-08 NOTE — Progress Notes (Unsigned)
 Electrophysiology Office Note:   Date:  01/09/2024  ID:  LYNDSEY DEMOS, DOB May 18, 1950, MRN [id number]  Primary Cardiologist: [name], MD (Inactive) Primary Heart Failure: None Electrophysiologist: Eulas BRAVO [place], MD      History of Present Illness:   Terri Alexander is a 73 y.o. female with h/o AF, AVNRT, PAC's / PVC's, HLD, descending thoracic aortic aneurysm seen today for routine electrophysiology follow-up s/p Ablation.  Since last being seen in our clinic the patient reports doing well. She had stopped her Toprol  post ablation and then had a couple episodes of fast rates in the first few weeks. She restarted 12.5 mg Toprol  once daily and it has been quiet since then. She is to see Dr. Silver for follow up regarding her thoracic aortic aneurysm later this week.     She denies chest pain, palpitations, dyspnea, PND, orthopnea, nausea, vomiting, dizziness, syncope, edema, weight gain, or early satiety.    Review of systems complete and found to be negative unless listed in HPI.   EP Information / Studies Reviewed:    EKG is ordered today. Personal review as below.  EKG Interpretation Date/Time:  Monday January 09 2024 14:24:08 EDT Ventricular Rate:  81 PR Interval:  184 QRS Duration:  86 QT Interval:  372 QTC Calculation: 432 R Axis:   42  Text Interpretation: Normal sinus rhythm Confirmed by Aniceto Jarvis ([id number]) on 01/09/2024 2:52:31 PM    Arrhythmia / AAD / Pertinent EP Studies AF > suspected initial events in late 90's DCCV 09/09/22  EPS 02/16/2023 > SR on presentation, PVI with PF energy, ablation of posterior wall  Flecainide  09/2023 > 11/2023   EPS 12/09/23 > SR on presentation, AVNRT inducible at EP study, AV conduction suggestive of dual AV nodal pathway, successful RF ablation of the slow pathway   Risk Assessment/Calculations:    CHA2DS2-VASc Score = 3   This indicates a 3.2% annual risk of stroke. The patient's score is based upon: CHF History: 0 HTN  History: 0 Diabetes History: 0 Stroke History: 0 Vascular Disease History: 1 Age Score: 1 Gender Score: 1             Physical Exam:   VS:  BP 110/68   Pulse 78   Ht 5' 2 (1.575 m)   Wt 137 lb 3.2 oz (62.2 kg)   LMP  (LMP Unknown)   SpO2 96%   BMI 25.09 kg/m    Wt Readings from Last 3 Encounters:  01/09/24 137 lb 3.2 oz (62.2 kg)  12/16/23 135 lb 6.4 oz (61.4 kg)  12/09/23 135 lb (61.2 kg)     GEN: Well nourished, well developed in no acute distress NECK: No JVD; No carotid bruits CARDIAC: Regular rate and rhythm, no murmurs, rubs, gallops RESPIRATORY:  Clear to auscultation without rales, wheezing or rhonchi  ABDOMEN: Soft, non-tender, non-distended EXTREMITIES:  No edema; No deformity   ASSESSMENT AND PLAN:    Persistent Atrial Fibrillation  CHA2DS2-VASc 3, s/p PVI ablation 02/2023. Previously on flecainide  short term.  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 Silver > keep beta blocker for now until f/u with [facility] as they may want to keep with aneurysm. Ok to stop from EP standpoint.    Follow up with Dr. Nancey in 6 months  Signed, Daphne Barrack, NP-C, AGACNP-BC [facility] - Electrophysiology  01/09/2024, 4:19 PM

## 2024-01-09 ENCOUNTER — Ambulatory Visit: Attending: Pulmonary Disease | Admitting: Pulmonary Disease

## 2024-01-09 ENCOUNTER — Other Ambulatory Visit: Payer: Self-pay

## 2024-01-09 ENCOUNTER — Encounter: Payer: Self-pay | Admitting: Pulmonary Disease

## 2024-01-09 VITALS — BP 110/68 | HR 78 | Ht 62.0 in | Wt 137.2 lb

## 2024-01-09 DIAGNOSIS — D6869 Other thrombophilia: Secondary | ICD-10-CM | POA: Insufficient documentation

## 2024-01-09 DIAGNOSIS — I4719 Other supraventricular tachycardia: Secondary | ICD-10-CM | POA: Diagnosis not present

## 2024-01-09 DIAGNOSIS — I4819 Other persistent atrial fibrillation: Secondary | ICD-10-CM | POA: Diagnosis not present

## 2024-01-09 DIAGNOSIS — I7123 Aneurysm of the descending thoracic aorta, without rupture: Secondary | ICD-10-CM

## 2024-01-09 DIAGNOSIS — Z23 Encounter for immunization: Secondary | ICD-10-CM | POA: Diagnosis not present

## 2024-01-09 MED ORDER — METOPROLOL SUCCINATE ER 25 MG PO TB24: 12.5000 mg | ORAL_TABLET | Freq: Every day | ORAL | 0 refills | Status: DC

## 2024-01-09 NOTE — Patient Instructions (Addendum)
 Medication Instructions:  Decrease metoprolol  succinate (Toprol  XL) to 12.5 mg daily, this will be 1/2 of the 25 mg tablet daily. *If you need a refill on your cardiac medications before your next appointment, please call your pharmacy*  Lab Work: None ordered If you have labs (blood work) drawn today and your tests are completely normal, you will receive your results only by: MyChart Message (if you have MyChart) OR A paper copy in the mail If you have any lab test that is abnormal or we need to change your treatment, we will call you to review the results.  Follow-Up: At [facility], you and your health needs are our priority.  As part of our continuing mission to provide you with exceptional heart care, our providers are all part of one team.  This team includes your primary Cardiologist (physician) and Advanced Practice Providers or APPs (Physician Assistants and Nurse Practitioners) who all work together to provide you with the care you need, when you need it.  Your next appointment:   6 month(s)  Provider:   Daphne Barrack, NP    Schedule 3 mo follow up with general  cardiology

## 2024-02-16 ENCOUNTER — Ambulatory Visit: Admitting: Vascular Surgery

## 2024-02-16 ENCOUNTER — Ambulatory Visit
Admission: RE | Admit: 2024-02-16 | Discharge: 2024-02-16 | Disposition: A | Discharge status: Home / Self Care | Source: Ambulatory Visit | Attending: Vascular Surgery | Admitting: Vascular Surgery

## 2024-02-16 DIAGNOSIS — R918 Other nonspecific abnormal finding of lung field: Secondary | ICD-10-CM | POA: Diagnosis not present

## 2024-02-16 DIAGNOSIS — I7123 Aneurysm of the descending thoracic aorta, without rupture: Secondary | ICD-10-CM

## 2024-02-22 NOTE — Progress Notes (Unsigned)
 Office Note    HPI: Terri Alexander is a 73 y.o. (08/17/1950) female presenting in follow up with a known type B aortic dissection.  Originally from [place], her and her husband met at a bar when he was in the service.  They have a son who went to [facility], and stayed, starting a family here in [place].  They moved closer to him to be close to the grandchildren.    In April 2024, Erminio underwent chest CT demonstrating chronic type B aortic dissection with some aneurysmal dilatation of the descending aorta.  The false lumen was thrombosed.  Follow-up was scheduled. Due to the size of the aneurysm, I have followed her on a 43-month basis.  She had no growth 6 months ago.  On exam today, Terri Alexander was doing well, accompanied by her husband.   Denies chest pain, back pain, abdominal pain.  Notes some coughing with significant laughter, or coughing with deep inhale.  The pt is not on a statin for cholesterol management.  The pt is not on a daily aspirin.   Other AC:  eliquis  The pt is on medication for hypertension.   The pt is not diabetic.  Tobacco hx:  -  Past Medical History:  Diagnosis Date   Adenomatous colon polyp    Allergy    Anemia    as a child, no current problem   Arrhythmia    Atrial fibrillation (HCC)    Blood transfusion without reported diagnosis @ 9 months   9 months old   Cataract    bilateral - MD just watching    GERD (gastroesophageal reflux disease)    Hyperlipidemia    Osteoporosis 04/15/2016   Skin cancer 2013   Skin cancer- Nose, face    Past Surgical History:  Procedure Laterality Date   ATRIAL FIBRILLATION ABLATION N/A 02/16/2023   Procedure: ATRIAL FIBRILLATION ABLATION;  Surgeon: [name], MD;  Location: [facility] INV[facility];  Service: Cardiovascular;  Laterality: N/A;   CARDIOVERSION N/A 09/09/2022   Procedure: CARDIOVERSION;  Surgeon: [name], MD;  Location: [facility] INVASIVE CV [facility];  Service: Cardiovascular;  Laterality: N/A;   CESAREAN  SECTION  1981   x 1   CESAREAN SECTION     COLONOSCOPY  2016   hx polyp, tics, sigmoid colon mod tortuous Dr [name],    EYE SURGERY Bilateral    retina repair x 2   LAPAROTOMY  1979   removal of ovarian cyst   nissan fundiplication  05/17/1997   REPAIR OF COMPLEX TRACTION RETINAL DETACHMENT Bilateral 01/2019   SVT ABLATION N/A 12/09/2023   Procedure: SVT ABLATION;  Surgeon: [name], MD;  Location: [facility] INVASIVE CV LAB;  Service: Cardiovascular;  Laterality: N/A;    Social History   Socioeconomic History   Marital status: Married    Spouse name: Not on file   Number of children: Not on file   Years of education: Not on file   Highest education level: 12th grade  Occupational History   Not on file  Tobacco Use   Smoking status: Never   Smokeless tobacco: Never   Tobacco comments:    Never smoked 12/16/23  Vaping Use   Vaping status: Never Used  Substance and Sexual Activity   Alcohol use: Yes    Alcohol/week: 1.0 standard drink of alcohol    Types: 1 Glasses of wine per week    Comment: rarely   Drug use: No   Sexual activity: Not Currently  Birth control/protection: Post-menopausal  Other Topics Concern   Not on file  Social History Narrative   Not on file   Social Drivers of Health   Financial Resource Strain: Low Risk  (11/14/2023)   Overall Financial Resource Strain (CARDIA)    Difficulty of Paying Living Expenses: Not hard at all  Food Insecurity: No Food Insecurity (11/14/2023)   Hunger Vital Sign    Worried About Running Out of Food in the Last Year: Never true    Ran Out of Food in the Last Year: Never true  Transportation Needs: Unknown (11/14/2023)   PRAPARE - Administrator, Civil Service (Medical): No    Lack of Transportation (Non-Medical): Not on file  Physical Activity: Insufficiently Active (11/14/2023)   Exercise Vital Sign    Days of Exercise per Week: 3 days    Minutes of Exercise per Session: 30 min  Stress: No Stress  Concern Present (11/14/2023)   [employer] of Occupational Health - Occupational Stress Questionnaire    Feeling of Stress: Only a little  Social Connections: Unknown (11/14/2023)   Social Connection and Isolation Panel    Frequency of Communication with Friends and Family: Twice a week    Frequency of Social Gatherings with Friends and Family: Once a week    Attends Religious Services: Not on Marketing executive or Organizations: No    Attends Banker Meetings: Not on file    Marital Status: Married  Intimate Partner Violence: Unknown (06/30/2023)   Humiliation, Afraid, Rape, and Kick questionnaire    Fear of Current or Ex-Partner: No    Emotionally Abused: No    Physically Abused: No    Sexually Abused: Not on file   Family History  Problem Relation Age of Onset   Arthritis Mother    Heart disease Mother    Vision loss Mother    Stroke Mother    Arrhythmia Mother    Depression Sister    Transient ischemic attack Sister 93   Depression Sister    Cancer Maternal Grandmother    Cancer Maternal Grandfather    Early death Maternal Grandfather    Heart disease Maternal Grandfather    Early death Paternal Grandfather    Heart disease Paternal Grandfather    Diabetes Other        Juvenile   Arrhythmia Maternal Aunt    Colon cancer Neg Hx    Rectal cancer Neg Hx    Prostate cancer Neg Hx     Current Outpatient Medications  Medication Sig Dispense Refill   acetaminophen  (TYLENOL ) 500 MG tablet Take 1,000 mg by mouth as needed for moderate pain (pain score 4-6).     alendronate  (FOSAMAX ) 70 MG tablet TAKE 1 TABLET BY MOUTH ONCE A WEEK ON AN EMPTY STOMACH WITH A FULL GLASS OF WATER 12 tablet 3   Ascorbic Acid (VITAMIN C) 1000 MG tablet Take 1,000 mg by mouth in the morning.     Biotin 1000 MCG tablet Take 1,000 mcg by mouth daily.     Calcium Carbonate-Vit D-Min (CALCIUM 1200 PO) Take 1 tablet by mouth in the morning.     Calcium Polycarbophil  (FIBER-CAPS PO) Take 1 capsule by mouth in the morning.     cetirizine (ZYRTEC) 10 MG tablet Take 10 mg by mouth at bedtime.     Cholecalciferol (VITAMIN D3) 125 MCG (5000 UT) TABS Take 5,000 Units by mouth in the morning.  Cranberry 500 MG CHEW Chew 500 mg by mouth in the morning.     cyclobenzaprine  (FLEXERIL ) 10 MG tablet Take 1 tablet (10 mg total) by mouth 3 (three) times daily as needed for muscle spasms. 30 tablet 1   dabigatran  (PRADAXA ) 150 MG CAPS capsule Take 1 capsule (150 mg total) by mouth 2 (two) times daily. 180 capsule 2   docusate sodium  (COLACE) 100 MG capsule Take 100 mg by mouth in the morning.     famotidine  (PEPCID ) 20 MG tablet Take 20 mg by mouth daily as needed for heartburn or indigestion.     furosemide  (LASIX ) 40 MG tablet Take 1 tablet (40 mg total) by mouth as needed for edema. 90 tablet 0   L-Lysine 500 MG CAPS Take 500 mg by mouth in the morning.     Lidocaine  (SALONPAS PAIN RELIEVING EX) Apply 1 Application topically daily as needed (pain).     Menthol-Methyl Salicylate (SALONPAS PAIN RELIEF PATCH EX) Apply 1 patch topically daily as needed (pain).     metoprolol  succinate (TOPROL  XL) 25 MG 24 hr tablet Take 0.5 tablets (12.5 mg total) by mouth daily. 45 tablet 0   Multiple Vitamin (MULTIVITAMIN WITH MINERALS) TABS tablet Take 1 tablet by mouth in the morning.     omeprazole (PRILOSEC OTC) 20 MG tablet Take 20 mg by mouth daily as needed (acid reflux).     Probiotic Product (PROBIOTIC PO) Take 1 tablet by mouth in the morning.     No current facility-administered medications for this visit.    No Known Allergies   REVIEW OF SYSTEMS:  [X]  denotes positive finding, [ ]  denotes negative finding Cardiac  Comments:  Chest pain or chest pressure:    Shortness of breath upon exertion:    Short of breath when lying flat:    Irregular heart rhythm:        Vascular    Pain in calf, thigh, or hip brought on by ambulation:    Pain in feet at night that wakes  you up from your sleep:     Blood clot in your veins:    Leg swelling:         Pulmonary    Oxygen at home:    Productive cough:     Wheezing:         Neurologic    Sudden weakness in arms or legs:     Sudden numbness in arms or legs:     Sudden onset of difficulty speaking or slurred speech:    Temporary loss of vision in one eye:     Problems with dizziness:         Gastrointestinal    Blood in stool:     Vomited blood:         Genitourinary    Burning when urinating:     Blood in urine:        Psychiatric    Major depression:         Hematologic    Bleeding problems:    Problems with blood clotting too easily:        Skin    Rashes or ulcers:        Constitutional    Fever or chills:      PHYSICAL EXAMINATION:  There were no vitals filed for this visit.  General:  WDWN in NAD; vital signs documented above Gait: Not observed HENT: WNL, normocephalic Pulmonary: normal non-labored breathing , without wheezing Cardiac: regular HR Abdomen:  soft, NT, no masses Skin: without rashes Vascular Exam/Pulses:  Right Left  Radial 2+ (normal) 2+ (normal)  Ulnar    Femoral    Popliteal    DP 2+ (normal) 2+ (normal)  PT     Extremities: without ischemic changes, without Gangrene , without cellulitis; without open wounds;  Musculoskeletal: no muscle wasting or atrophy  Neurologic: A&O X 3;  No focal weakness or paresthesias are detected Psychiatric:  The pt has Normal affect.   Non-Invasive Vascular Imaging:    See CT    ASSESSMENT/PLAN: EMMELIA HOLDSWORTH is a 73 y.o. female presenting in follow-up with known type B aortic dissection with aneurysmal degeneration of the descending aorta.  Most recent CT was independently reviewed.  While she did not have size change for over 18 months, on the most recent noncontrasted CT scan, the aorta has dilated nearly a centimeter to 5.8 cm from 4.9 cm.   I had a long discussion with Terri Alexander and her husband regarding her  chronic type B aortic dissection with aneurysmal degeneration.  She has now reached the size criteria necessary for repair.  Prior to discussing surgery, I have ordered a CT angio chest abdomen pelvis to better assess the anatomy of the aneurysm.  From previous scans, I think that she would be a good endovascular candidate as long as there have been no significant changes.  We discussed the signs and symptoms of acute aortic syndrome.  I asked her to call 911 should any new-onset back pain, chest pain occur.   [name], MD [facility] [phone] Total time of patient care including pre-visit research, consultation, and documentation greater than 30 minutes

## 2024-02-23 ENCOUNTER — Ambulatory Visit: Attending: Vascular Surgery | Admitting: Vascular Surgery

## 2024-02-23 ENCOUNTER — Encounter: Payer: Self-pay | Admitting: Vascular Surgery

## 2024-02-23 VITALS — BP 128/73 | HR 82 | Temp 98.0°F | Resp 18 | Ht 62.0 in | Wt 136.8 lb

## 2024-02-23 DIAGNOSIS — I7123 Aneurysm of the descending thoracic aorta, without rupture: Secondary | ICD-10-CM | POA: Diagnosis not present

## 2024-02-24 ENCOUNTER — Encounter: Payer: Self-pay | Admitting: Internal Medicine

## 2024-02-24 ENCOUNTER — Other Ambulatory Visit: Payer: Medicare Other

## 2024-02-24 ENCOUNTER — Ambulatory Visit: Attending: Internal Medicine | Admitting: Internal Medicine

## 2024-02-24 ENCOUNTER — Other Ambulatory Visit: Payer: Self-pay

## 2024-02-24 VITALS — BP 128/76 | HR 69 | Ht 62.0 in | Wt 137.6 lb

## 2024-02-24 DIAGNOSIS — Z01818 Encounter for other preprocedural examination: Secondary | ICD-10-CM | POA: Diagnosis not present

## 2024-02-24 DIAGNOSIS — I71012 Dissection of descending thoracic aorta: Secondary | ICD-10-CM | POA: Insufficient documentation

## 2024-02-24 DIAGNOSIS — I7123 Aneurysm of the descending thoracic aorta, without rupture: Secondary | ICD-10-CM

## 2024-02-24 DIAGNOSIS — D6869 Other thrombophilia: Secondary | ICD-10-CM | POA: Diagnosis not present

## 2024-02-24 DIAGNOSIS — R072 Precordial pain: Secondary | ICD-10-CM | POA: Insufficient documentation

## 2024-02-24 DIAGNOSIS — I4719 Other supraventricular tachycardia: Secondary | ICD-10-CM | POA: Diagnosis not present

## 2024-02-24 DIAGNOSIS — I4891 Unspecified atrial fibrillation: Secondary | ICD-10-CM | POA: Insufficient documentation

## 2024-02-24 DIAGNOSIS — I4819 Other persistent atrial fibrillation: Secondary | ICD-10-CM | POA: Insufficient documentation

## 2024-02-24 NOTE — Patient Instructions (Addendum)
 Medication Instructions:  Your physician recommends that you continue on your current medications as directed. Please refer to the Current Medication list given to you today.  *If you need a refill on your cardiac medications before your next appointment, please call your pharmacy*  Lab Work: None Ordered If you have labs (blood work) drawn today and your tests are completely normal, you will receive your results only by: MyChart Message (if you have MyChart) OR A paper copy in the mail If you have any lab test that is abnormal or we need to change your treatment, we will call you to review the results.  Testing/Procedures: Coronary CT  Follow-Up: At [facility], you and your health needs are our priority.  As part of our continuing mission to provide you with exceptional heart care, our providers are all part of one team.  This team includes your primary Cardiologist (physician) and Advanced Practice Providers or APPs (Physician Assistants and Nurse Practitioners) who all work together to provide you with the care you need, when you need it.  Your next appointment:   3 month(s)  Provider:   Soyla Merck, MD , or any APP   We recommend signing up for the patient portal called MyChart.  Sign up information is provided on this After Visit Summary.  MyChart is used to connect with patients for Virtual Visits (Telemedicine).  Patients are able to view lab/test results, encounter notes, upcoming appointments, etc.  Non-urgent messages can be sent to your provider as well.   To learn more about what you can do with MyChart, go to [email or web address].   Other Instructions    Your cardiac CT will be scheduled at one of the below locations:    [facility] [address]  [place], [place] [address] [phone]   If scheduled at the Heart and Vascular Tower at [facility] [address], please enter the parking lot using the [address] entrance  and use the FREE valet service at the patient drop-off area. Enter the building and check-in with registration on the main floor.  Please follow these instructions carefully (unless otherwise directed):  An IV will be required for this test and Nitroglycerin will be given.  Hold all erectile dysfunction medications at least 3 days (72 hrs) prior to test. (Ie viagra, cialis, sildenafil, tadalafil, etc)   On the Night Before the Test: Be sure to Drink plenty of water. Do not consume any caffeinated/decaffeinated beverages or chocolate 12 hours prior to your test. Do not take any antihistamines 12 hours prior to your test.    On the Day of the Test: Drink plenty of water until 1 hour prior to the test. Do not eat any food 1 hour prior to test. You may take your regular medications prior to the test.  Take metoprolol  (Lopressor ) two hours prior to test. If you take Furosemide /Hydrochlorothiazide/Spironolactone/Chlorthalidone, please HOLD on the morning of the test. Patients who wear a continuous glucose monitor MUST remove the device prior to scanning. FEMALES- please wear underwire-free bra if available, avoid dresses & tight clothing       After the Test: Drink plenty of water. After receiving IV contrast, you may experience a mild flushed feeling. This is normal. On occasion, you may experience a mild rash up to 24 hours after the test. This is not dangerous. If this occurs, you can take Benadryl  25 mg, Zyrtec, Claritin, or Allegra and increase your fluid intake. (Patients taking Tikosyn should avoid Benadryl ,  and may take Zyrtec, Claritin, or Allegra) If you experience trouble breathing, this can be serious. If it is severe call 911 IMMEDIATELY. If it is mild, please call our office.  We will call to schedule your test 2-4 weeks out understanding that some insurance companies will need an authorization prior to the service being performed.   For more information and frequently asked  questions, please visit our website : [email or web address]  For non-scheduling related questions, please contact the cardiac imaging nurse navigator should you have any questions/concerns: Cardiac Imaging Nurse Navigators Direct Office Dial: [phone]   For scheduling needs, including cancellations and rescheduling, please call [place], [phone].

## 2024-02-24 NOTE — Progress Notes (Signed)
 Cardiology Office Note:  .   Date:  02/24/2024  ID:  Terri Alexander, DOB 1950/11/21, MRN [id number] PCP: [name], MD  [facility] Providers Cardiologist:  Soyla DELENA Merck, MD Electrophysiologist:  Eulas FORBES Furbish, MD    History of Present Illness: .   Terri Alexander is a 73 y.o. female.  Discussed the use of AI scribe software for clinical note transcription with the patient, who gave verbal consent to proceed.  History of Present Illness Terri Alexander is a 73 year old female with chronic type B aortic dissection with recent change who presents with chest discomfort and shortness of breath. She was referred by Dr. Silver for evaluation of cardiovascular risk prior to probable TEVAR.  She has an aortic dissection in the descending aorta, which has increased from 4.9 cm in April to 5.8 cm currently, prompting consideration for stent placement. Chest tightness and shortness of breath have been present since February 05, 2024, especially during physical activities like house cleaning and walking on inclines, resolving quickly with rest. Coronary calcium score is zero from CT PV study 4/24, personally interpreted.   She has atrial fibrillation and has undergone ablation, one for SVT in July 2025 and another for atrial fibrillation in October 2024, with symptom improvement post-ablation. No history of myocardial infarction or cerebrovascular accident. Current medications include Pradaxa  150 mg twice daily, metoprolol  12.5 mg daily, omeprazole once daily, and famotidine  as needed. Lasix  40 mg is used infrequently.    ROS: negative except per HPI above.  Studies Reviewed: [name]   EKG Interpretation Date/Time:  Friday February 24 2024 09:55:09 EDT Ventricular Rate:  69 PR Interval:  218 QRS Duration:  86 QT Interval:  394 QTC Calculation: 422 R Axis:   49  Text Interpretation: Sinus rhythm with 1st degree A-V block When compared with ECG of 09-Jan-2024 14:24, PR interval  has increased Criteria for Anterior infarct are no longer Present Confirmed by Merck Soyla ([id number]) on 02/24/2024 10:31:13 AM    Results RADIOLOGY CT scan of descending aorta: Diameter 5.8 cm (08/2023) CT scan of heart pre ablation No calcium in coronary arteries  DIAGNOSTIC Echocardiogram: Normal left ventricular function, normal right heart, normal pressures, mild MR Risk Assessment/Calculations:    CHA2DS2-VASc Score = 3   This indicates a 3.2% annual risk of stroke. The patient's score is based upon: CHF History: 0 HTN History: 0 Diabetes History: 0 Stroke History: 0 Vascular Disease History: 1 Age Score: 1 Gender Score: 1      Physical Exam:   VS:  BP 128/76   Pulse 69   Ht 5' 2 (1.575 m)   Wt 137 lb 9.6 oz (62.4 kg)   LMP  (LMP Unknown)   SpO2 97%   BMI 25.17 kg/m    Wt Readings from Last 3 Encounters:  02/24/24 137 lb 9.6 oz (62.4 kg)  02/23/24 136 lb 12.8 oz (62.1 kg)  01/09/24 137 lb 3.2 oz (62.2 kg)     Physical Exam GENERAL: Alert, cooperative, well developed, no acute distress. HEENT: Normocephalic, normal oropharynx, moist mucous membranes. CHEST: Clear to auscultation bilaterally, no wheezes, rhonchi, or crackles. CARDIOVASCULAR: Normal heart rate and rhythm, S1 and S2 normal without murmurs. ABDOMEN: Soft, non-tender, non-distended, without organomegaly, normal bowel sounds. EXTREMITIES: No cyanosis or edema. NEUROLOGICAL: Cranial nerves grossly intact, moves all extremities without gross motor or sensory deficit.   ASSESSMENT AND PLAN: .    Assessment and Plan Assessment & Plan Preprocedural cardiovascular evaluation  for chronic descending aortic dissection Pt with chest discomfort in the setting of type B dissection, prior Nissen fundoplication with continued symptoms. Chest pain is possibly cardiac with exertional symptoms.  - Order coronary CT scan of the heart with contrast to assess for blockages in setting of chest discomfort.  -  Likely we can coordinate CT scan of the heart and aortic evaluation in one acquisition.  Chronic descending aortic dissection Aortic dissection increased from 4.9 cm to 5.8 cm, necessitating procedural consideration. She is informed and will follow up with Doctor Lanis.  Exertional chest discomfort and dyspnea for evaluation Exertional chest discomfort and dyspnea since September 21. Differential includes cardiac causes and GERD. Zero calcium score reduces likelihood of significant coronary artery disease. - Order CT scan of the heart with contrast to assess for blockages. - Advise famotidine  or Tums for chest discomfort to differentiate cardiac from gastrointestinal causes. - She discontinued her statin due to improved labs however we will reassess need after CCTA. Open to restarting if plaque present.   Gastroesophageal reflux disease post Nissen fundoplication GERD may contribute to chest discomfort. Symptoms relieved by antacids suggest gastrointestinal component. - Continue omeprazole once daily. - Use famotidine  as needed for chest discomfort.  History of atrial fibrillation status post ablation Atrial fibrillation successfully ablated in October 2024. Continues on Pradaxa . - Continue Pradaxa  150 mg twice daily.  History of supraventricular tachycardia status post ablation Supraventricular tachycardia successfully ablated in July 2025. No current symptoms.          Soyla Merck, MD, FACC

## 2024-02-24 NOTE — Addendum Note (Signed)
 Addended by: RAYNA MOATS A on: 02/24/2024 12:32 PM   Modules accepted: Orders

## 2024-02-28 DIAGNOSIS — Z23 Encounter for immunization: Secondary | ICD-10-CM | POA: Diagnosis not present

## 2024-02-29 ENCOUNTER — Encounter (HOSPITAL_COMMUNITY): Payer: Self-pay

## 2024-03-02 ENCOUNTER — Ambulatory Visit (HOSPITAL_COMMUNITY)
Admission: RE | Admit: 2024-03-02 | Discharge: 2024-03-02 | Disposition: A | Discharge status: Home / Self Care | Source: Ambulatory Visit | Attending: Internal Medicine | Admitting: Internal Medicine

## 2024-03-02 DIAGNOSIS — I7123 Aneurysm of the descending thoracic aorta, without rupture: Secondary | ICD-10-CM

## 2024-03-02 DIAGNOSIS — Z01818 Encounter for other preprocedural examination: Secondary | ICD-10-CM | POA: Insufficient documentation

## 2024-03-02 DIAGNOSIS — K5730 Diverticulosis of large intestine without perforation or abscess without bleeding: Secondary | ICD-10-CM | POA: Diagnosis not present

## 2024-03-02 DIAGNOSIS — R072 Precordial pain: Secondary | ICD-10-CM | POA: Diagnosis not present

## 2024-03-02 DIAGNOSIS — R0789 Other chest pain: Secondary | ICD-10-CM | POA: Diagnosis not present

## 2024-03-02 DIAGNOSIS — I71011 Dissection of aortic arch: Secondary | ICD-10-CM | POA: Diagnosis not present

## 2024-03-02 DIAGNOSIS — I517 Cardiomegaly: Secondary | ICD-10-CM | POA: Insufficient documentation

## 2024-03-02 DIAGNOSIS — I700 Atherosclerosis of aorta: Secondary | ICD-10-CM | POA: Diagnosis not present

## 2024-03-02 MED ORDER — IOHEXOL 350 MG/ML SOLN
100.0000 mL | Freq: Once | INTRAVENOUS | Status: AC | PRN
  Administered 2024-03-02: 100 mL via INTRAVENOUS

## 2024-03-02 MED ORDER — NITROGLYCERIN 0.4 MG SL SUBL
0.8000 mg | SUBLINGUAL_TABLET | Freq: Once | SUBLINGUAL | Status: AC
  Administered 2024-03-02: 0.8 mg via SUBLINGUAL

## 2024-03-06 NOTE — Progress Notes (Unsigned)
 Office Note    HPI: Terri Alexander is a 73 y.o. (25-Dec-1950) female presenting in follow up with a known type B aortic dissection.  Originally from [place], her and her husband met at a bar when he was in the service.  They have a son who went to [facility], and stayed, starting a family here in [place].  They moved closer to him to be close to the grandchildren.    In April 2024, Erminio underwent chest CT demonstrating chronic type B aortic dissection with some aneurysmal dilatation of the descending aorta.  The false lumen was thrombosed.  Follow-up was scheduled. Due to the size of the aneurysm, I have followed her on a 77-month basis.  She had no growth 6 months ago.  On exam today, [name] was doing well, accompanied by her husband.   Denies chest pain, back pain, abdominal pain.  Notes some coughing with significant laughter, or coughing with deep inhale.  The pt is not on a statin for cholesterol management.  The pt is not on a daily aspirin.   Other AC:  eliquis  The pt is on medication for hypertension.   The pt is not diabetic.  Tobacco hx:  -  Past Medical History:  Diagnosis Date   Adenomatous colon polyp    Allergy    Anemia    as a child, no current problem   Arrhythmia    Atrial fibrillation (HCC)    Blood transfusion without reported diagnosis @ 9 months   9 months old   Cataract    bilateral - MD just watching    GERD (gastroesophageal reflux disease)    Hyperlipidemia    Osteoporosis 04/15/2016   Skin cancer 2013   Skin cancer- Nose, face    Past Surgical History:  Procedure Laterality Date   ATRIAL FIBRILLATION ABLATION N/A 02/16/2023   Procedure: ATRIAL FIBRILLATION ABLATION;  Surgeon: [name], MD;  Location: [facility];  Service: Cardiovascular;  Laterality: N/A;   CARDIOVERSION N/A 09/09/2022   Procedure: CARDIOVERSION;  Surgeon: [name], MD;  Location: [facility];  Service: Cardiovascular;  Laterality: N/A;   CESAREAN  SECTION  1981   x 1   CESAREAN SECTION     COLONOSCOPY  2016   hx polyp, tics, sigmoid colon mod tortuous Dr [name],    EYE SURGERY Bilateral    retina repair x 2   LAPAROTOMY  1979   removal of ovarian cyst   nissan fundiplication  05/17/1997   REPAIR OF COMPLEX TRACTION RETINAL DETACHMENT Bilateral 01/2019   SVT ABLATION N/A 12/09/2023   Procedure: SVT ABLATION;  Surgeon: [name], MD;  Location: [facility];  Service: Cardiovascular;  Laterality: N/A;    Social History   Socioeconomic History   Marital status: Married    Spouse name: Not on file   Number of children: Not on file   Years of education: Not on file   Highest education level: 12th grade  Occupational History   Not on file  Tobacco Use   Smoking status: Never   Smokeless tobacco: Never   Tobacco comments:    Never smoked 12/16/23  Vaping Use   Vaping status: Never Used  Substance and Sexual Activity   Alcohol use: Yes    Alcohol/week: 1.0 standard drink of alcohol    Types: 1 Glasses of wine per week    Comment: rarely   Drug use: No   Sexual activity: Not Currently  Birth control/protection: Post-menopausal  Other Topics Concern   Not on file  Social History Narrative   Not on file   Social Drivers of Health   Financial Resource Strain: Low Risk  (11/14/2023)   Overall Financial Resource Strain (CARDIA)    Difficulty of Paying Living Expenses: Not hard at all  Food Insecurity: No Food Insecurity (11/14/2023)   Hunger Vital Sign    Worried About Running Out of Food in the Last Year: Never true    Ran Out of Food in the Last Year: Never true  Transportation Needs: Unknown (11/14/2023)   PRAPARE - Administrator, Civil Service (Medical): No    Lack of Transportation (Non-Medical): Not on file  Physical Activity: Insufficiently Active (11/14/2023)   Exercise Vital Sign    Days of Exercise per Week: 3 days    Minutes of Exercise per Session: 30 min  Stress: No Stress  Concern Present (11/14/2023)   [employer] of Occupational Health - Occupational Stress Questionnaire    Feeling of Stress: Only a little  Social Connections: Unknown (11/14/2023)   Social Connection and Isolation Panel    Frequency of Communication with Friends and Family: Twice a week    Frequency of Social Gatherings with Friends and Family: Once a week    Attends Religious Services: Not on Insurance claims handler of Clubs or Organizations: No    Attends Banker Meetings: Not on file    Marital Status: Married  Intimate Partner Violence: Unknown (06/30/2023)   Humiliation, Afraid, Rape, and Kick questionnaire    Fear of Current or Ex-Partner: No    Emotionally Abused: No    Physically Abused: No    Sexually Abused: Not on file   Family History  Problem Relation Age of Onset   Arthritis Mother    Heart disease Mother    Vision loss Mother    Stroke Mother    Arrhythmia Mother    Depression Sister    Transient ischemic attack Sister 77   Depression Sister    Cancer Maternal Grandmother    Cancer Maternal Grandfather    Early death Maternal Grandfather    Heart disease Maternal Grandfather    Early death Paternal Grandfather    Heart disease Paternal Grandfather    Diabetes Other        Juvenile   Arrhythmia Maternal Aunt    Colon cancer Neg Hx    Rectal cancer Neg Hx    Prostate cancer Neg Hx     Current Outpatient Medications  Medication Sig Dispense Refill   acetaminophen  (TYLENOL ) 500 MG tablet Take 1,000 mg by mouth as needed for moderate pain (pain score 4-6).     alendronate  (FOSAMAX ) 70 MG tablet TAKE 1 TABLET BY MOUTH ONCE A WEEK ON AN EMPTY STOMACH WITH A FULL GLASS OF WATER 12 tablet 3   Ascorbic Acid (VITAMIN C) 1000 MG tablet Take 1,000 mg by mouth in the morning.     Biotin 1000 MCG tablet Take 1,000 mcg by mouth daily.     Calcium Carbonate-Vit D-Min (CALCIUM 1200 PO) Take 1 tablet by mouth in the morning.     Calcium Polycarbophil  (FIBER-CAPS PO) Take 1 capsule by mouth in the morning.     cetirizine (ZYRTEC) 10 MG tablet Take 10 mg by mouth at bedtime.     Cholecalciferol (VITAMIN D3) 125 MCG (5000 UT) TABS Take 5,000 Units by mouth in the morning.  Cranberry 500 MG CHEW Chew 500 mg by mouth in the morning.     cyclobenzaprine  (FLEXERIL ) 10 MG tablet Take 1 tablet (10 mg total) by mouth 3 (three) times daily as needed for muscle spasms. 30 tablet 1   dabigatran  (PRADAXA ) 150 MG CAPS capsule Take 1 capsule (150 mg total) by mouth 2 (two) times daily. 180 capsule 2   docusate sodium  (COLACE) 100 MG capsule Take 100 mg by mouth in the morning.     famotidine  (PEPCID ) 20 MG tablet Take 20 mg by mouth daily as needed for heartburn or indigestion.     furosemide  (LASIX ) 40 MG tablet Take 1 tablet (40 mg total) by mouth as needed for edema. 90 tablet 0   L-Lysine 500 MG CAPS Take 500 mg by mouth in the morning.     Lidocaine  (SALONPAS PAIN RELIEVING EX) Apply 1 Application topically daily as needed (pain).     Menthol-Methyl Salicylate (SALONPAS PAIN RELIEF PATCH EX) Apply 1 patch topically daily as needed (pain).     metoprolol  succinate (TOPROL  XL) 25 MG 24 hr tablet Take 0.5 tablets (12.5 mg total) by mouth daily. 45 tablet 0   Multiple Vitamin (MULTIVITAMIN WITH MINERALS) TABS tablet Take 1 tablet by mouth in the morning.     omeprazole (PRILOSEC OTC) 20 MG tablet Take 20 mg by mouth daily as needed (acid reflux).     Probiotic Product (PROBIOTIC PO) Take 1 tablet by mouth in the morning.     No current facility-administered medications for this visit.    No Known Allergies   REVIEW OF SYSTEMS:  [X]  denotes positive finding, [ ]  denotes negative finding Cardiac  Comments:  Chest pain or chest pressure:    Shortness of breath upon exertion:    Short of breath when lying flat:    Irregular heart rhythm:        Vascular    Pain in calf, thigh, or hip brought on by ambulation:    Pain in feet at night that wakes  you up from your sleep:     Blood clot in your veins:    Leg swelling:         Pulmonary    Oxygen at home:    Productive cough:     Wheezing:         Neurologic    Sudden weakness in arms or legs:     Sudden numbness in arms or legs:     Sudden onset of difficulty speaking or slurred speech:    Temporary loss of vision in one eye:     Problems with dizziness:         Gastrointestinal    Blood in stool:     Vomited blood:         Genitourinary    Burning when urinating:     Blood in urine:        Psychiatric    Major depression:         Hematologic    Bleeding problems:    Problems with blood clotting too easily:        Skin    Rashes or ulcers:        Constitutional    Fever or chills:      PHYSICAL EXAMINATION:  There were no vitals filed for this visit.  General:  WDWN in NAD; vital signs documented above Gait: Not observed HENT: WNL, normocephalic Pulmonary: normal non-labored breathing , without wheezing Cardiac: regular HR Abdomen:  soft, NT, no masses Skin: without rashes Vascular Exam/Pulses:  Right Left  Radial 2+ (normal) 2+ (normal)  Ulnar    Femoral    Popliteal    DP 2+ (normal) 2+ (normal)  PT     Extremities: without ischemic changes, without Gangrene , without cellulitis; without open wounds;  Musculoskeletal: no muscle wasting or atrophy  Neurologic: A&O X 3;  No focal weakness or paresthesias are detected Psychiatric:  The pt has Normal affect.   Non-Invasive Vascular Imaging:    See CT    ASSESSMENT/PLAN: RECIA SONS is a 73 y.o. female presenting in follow-up with known type B aortic dissection with aneurysmal degeneration of the descending aorta.  Most recent CT was independently reviewed.  While she did not have size change for over 18 months, on the most recent noncontrasted CT scan, the aorta has dilated nearly a centimeter to 5.8 cm from 4.9 cm.   I had a long discussion with Ayerim and her husband regarding her  chronic type B aortic dissection with aneurysmal degeneration.  She has now reached the size criteria necessary for repair.  Prior to discussing surgery, I have ordered a CT angio chest abdomen pelvis to better assess the anatomy of the aneurysm.  From previous scans, I think that she would be a good endovascular candidate as long as there have been no significant changes.  We discussed the signs and symptoms of acute aortic syndrome.  I asked her to call 911 should any new-onset back pain, chest pain occur.   [name], MD [facility] [phone] Total time of patient care including pre-visit research, consultation, and documentation greater than 30 minutes

## 2024-03-08 ENCOUNTER — Other Ambulatory Visit: Payer: Self-pay

## 2024-03-08 ENCOUNTER — Encounter: Payer: Self-pay | Admitting: Vascular Surgery

## 2024-03-08 ENCOUNTER — Ambulatory Visit: Attending: Vascular Surgery | Admitting: Vascular Surgery

## 2024-03-08 VITALS — BP 128/78 | HR 74 | Temp 97.8°F | Resp 18 | Ht 62.0 in | Wt 140.0 lb

## 2024-03-08 DIAGNOSIS — I878 Other specified disorders of veins: Secondary | ICD-10-CM

## 2024-03-08 DIAGNOSIS — I7123 Aneurysm of the descending thoracic aorta, without rupture: Secondary | ICD-10-CM

## 2024-03-08 DIAGNOSIS — I71019 Dissection of thoracic aorta, unspecified: Secondary | ICD-10-CM | POA: Diagnosis not present

## 2024-03-10 ENCOUNTER — Ambulatory Visit: Payer: Self-pay | Admitting: Internal Medicine

## 2024-03-10 ENCOUNTER — Encounter: Payer: Self-pay | Admitting: Family Medicine

## 2024-03-12 ENCOUNTER — Encounter (HOSPITAL_COMMUNITY): Payer: Self-pay

## 2024-03-12 ENCOUNTER — Other Ambulatory Visit: Payer: Self-pay | Admitting: Family Medicine

## 2024-03-12 MED ORDER — METOPROLOL SUCCINATE ER 25 MG PO TB24: 12.5000 mg | ORAL_TABLET | Freq: Every day | ORAL | 3 refills | Status: DC

## 2024-03-12 MED ORDER — DABIGATRAN ETEXILATE MESYLATE 150 MG PO CAPS: 150.0000 mg | ORAL_CAPSULE | Freq: Two times a day (BID) | ORAL | 3 refills | Status: AC

## 2024-03-12 NOTE — Pre-Procedure Instructions (Signed)
 Surgical Instructions   Your procedure is scheduled on March 19, 2024. Report to [facility] Main Entrance A at 8:50 A.M., then check in with the Admitting office. Any questions or running late day of surgery: call [phone]  Questions prior to your surgery date: call [phone], Monday-Friday, 8am-4pm. If you experience any cold or flu symptoms such as cough, fever, chills, shortness of breath, etc. between now and your scheduled surgery, please notify us  at the above number.     Remember:  Do not eat or drink after midnight the night before your surgery    Take these medicines the morning of surgery with A SIP OF WATER: cetirizine (ZYRTEC)  docusate sodium  (COLACE)  metoprolol  succinate (TOPROL  XL)    May take these medicines IF NEEDED: acetaminophen  (TYLENOL )  cyclobenzaprine  (FLEXERIL )  famotidine  (PEPCID )  omeprazole (PRILOSEC OTC)    STOP taking your dabigatran  (PRADAXA ) three days prior to surgery. Your last dose will be October 30th.   One week prior to surgery, STOP taking any Aspirin (unless otherwise instructed by your surgeon) Aleve, Naproxen, Ibuprofen, Motrin, Advil, Goody's, BC's, all herbal medications, fish oil, and non-prescription vitamins.                     Do NOT Smoke (Tobacco/Vaping) for 24 hours prior to your procedure.  If you use a CPAP at night, you may bring your mask/headgear for your overnight stay.   You will be asked to remove any contacts, glasses, piercing's, hearing aid's, dentures/partials prior to surgery. Please bring cases for these items if needed.    Patients discharged the day of surgery will not be allowed to drive home, and someone needs to stay with them for 24 hours.  SURGICAL WAITING ROOM VISITATION Patients may have no more than 2 support people in the waiting area - these visitors may rotate.   Pre-op  nurse will coordinate an appropriate time for 1 ADULT support person, who may not rotate, to accompany patient in  pre-op .  Children under the age of 20 must have an adult with them who is not the patient and must remain in the main waiting area with an adult.  If the patient needs to stay at the hospital during part of their recovery, the visitor guidelines for inpatient rooms apply.  Please refer to the [facility] website for the visitor guidelines for any additional information.   If you received a COVID test during your pre-op  visit  it is requested that you wear a mask when out in public, stay away from anyone that may not be feeling well and notify your surgeon if you develop symptoms. If you have been in contact with anyone that has tested positive in the last 10 days please notify you surgeon.      Pre-operative CHG Bathing Instructions   You can play a key role in reducing the risk of infection after surgery. Your skin needs to be as free of germs as possible. You can reduce the number of germs on your skin by washing with CHG (chlorhexidine  gluconate) soap before surgery. CHG is an antiseptic soap that kills germs and continues to kill germs even after washing.   DO NOT use if you have an allergy to chlorhexidine /CHG or antibacterial soaps. If your skin becomes reddened or irritated, stop using the CHG and notify one of our RNs at [phone].              TAKE A SHOWER THE NIGHT BEFORE SURGERY  Please keep in mind the following:  DO NOT shave, including legs and underarms, 48 hours prior to surgery.   You may shave your face before/day of surgery.  Place clean sheets on your bed the night before surgery Use a clean washcloth (not used since being washed) for shower. DO NOT sleep with pet's night before surgery.  CHG Shower Instructions:  Wash your face and private area with normal soap. If you choose to wash your hair, wash first with your normal shampoo.  After you use shampoo/soap, rinse your hair and body thoroughly to remove shampoo/soap residue.  Turn the water OFF and apply half  the bottle of CHG soap to a CLEAN washcloth.  Apply CHG soap ONLY FROM YOUR NECK DOWN TO YOUR TOES (washing for 3-5 minutes)  DO NOT use CHG soap on face, private areas, open wounds, or sores.  Pay special attention to the area where your surgery is being performed.  If you are having back surgery, having someone wash your back for you may be helpful. Wait 2 minutes after CHG soap is applied, then you may rinse off the CHG soap.  Pat dry with a clean towel  Put on clean pajamas    Additional instructions for the day of surgery: If you choose, you may shower the morning of surgery with an antibacterial soap.  DO NOT APPLY any lotions, deodorants, cologne, or perfumes.   Do not wear jewelry or makeup Do not wear nail polish, gel polish, artificial nails, or any other type of covering on natural nails (fingers and toes) Do not bring valuables to the hospital. [facility] is not responsible for valuables/personal belongings. Put on clean/comfortable clothes.  Please brush your teeth.  Ask your nurse before applying any prescription medications to the skin.

## 2024-03-13 ENCOUNTER — Encounter (HOSPITAL_COMMUNITY)
Admission: RE | Admit: 2024-03-13 | Discharge: 2024-03-13 | Disposition: A | Discharge status: Home / Self Care | Source: Ambulatory Visit | Attending: Vascular Surgery | Admitting: Vascular Surgery

## 2024-03-13 ENCOUNTER — Other Ambulatory Visit: Payer: Self-pay

## 2024-03-13 ENCOUNTER — Encounter (HOSPITAL_COMMUNITY): Payer: Self-pay

## 2024-03-13 VITALS — BP 136/68 | HR 77 | Temp 97.9°F | Resp 17 | Ht 62.0 in | Wt 139.0 lb

## 2024-03-13 DIAGNOSIS — I7123 Aneurysm of the descending thoracic aorta, without rupture: Secondary | ICD-10-CM | POA: Diagnosis not present

## 2024-03-13 DIAGNOSIS — Z01812 Encounter for preprocedural laboratory examination: Secondary | ICD-10-CM | POA: Diagnosis not present

## 2024-03-13 DIAGNOSIS — Z7902 Long term (current) use of antithrombotics/antiplatelets: Secondary | ICD-10-CM | POA: Insufficient documentation

## 2024-03-13 DIAGNOSIS — Z01818 Encounter for other preprocedural examination: Secondary | ICD-10-CM | POA: Diagnosis present

## 2024-03-13 DIAGNOSIS — I471 Supraventricular tachycardia, unspecified: Secondary | ICD-10-CM | POA: Diagnosis not present

## 2024-03-13 DIAGNOSIS — E785 Hyperlipidemia, unspecified: Secondary | ICD-10-CM | POA: Diagnosis not present

## 2024-03-13 DIAGNOSIS — I878 Other specified disorders of veins: Secondary | ICD-10-CM | POA: Insufficient documentation

## 2024-03-13 DIAGNOSIS — Z79899 Other long term (current) drug therapy: Secondary | ICD-10-CM | POA: Diagnosis not present

## 2024-03-13 DIAGNOSIS — K219 Gastro-esophageal reflux disease without esophagitis: Secondary | ICD-10-CM | POA: Insufficient documentation

## 2024-03-13 DIAGNOSIS — K449 Diaphragmatic hernia without obstruction or gangrene: Secondary | ICD-10-CM | POA: Diagnosis not present

## 2024-03-13 DIAGNOSIS — I4891 Unspecified atrial fibrillation: Secondary | ICD-10-CM | POA: Diagnosis not present

## 2024-03-13 HISTORY — DX: Personal history of other diseases of the digestive system: Z87.19

## 2024-03-13 HISTORY — DX: Cardiac arrhythmia, unspecified: I49.9

## 2024-03-13 HISTORY — DX: Peripheral vascular disease, unspecified: I73.9

## 2024-03-13 LAB — CBC
HCT: 41.3 % (ref 36.0–46.0)
Hemoglobin: 13.3 g/dL (ref 12.0–15.0)
MCH: 29.9 pg (ref 26.0–34.0)
MCHC: 32.2 g/dL (ref 30.0–36.0)
MCV: 92.8 fL (ref 80.0–100.0)
Platelets: 319 K/uL (ref 150–400)
RBC: 4.45 MIL/uL (ref 3.87–5.11)
RDW: 14.9 % (ref 11.5–15.5)
WBC: 9 K/uL (ref 4.0–10.5)
nRBC: 0 % (ref 0.0–0.2)

## 2024-03-13 LAB — URINALYSIS, ROUTINE W REFLEX MICROSCOPIC
Bilirubin Urine: NEGATIVE
Glucose, UA: NEGATIVE mg/dL
Hgb urine dipstick: NEGATIVE
Ketones, ur: NEGATIVE mg/dL
Leukocytes,Ua: NEGATIVE
Nitrite: NEGATIVE
Protein, ur: NEGATIVE mg/dL
Specific Gravity, Urine: 1.01 (ref 1.005–1.030)
pH: 6 (ref 5.0–8.0)

## 2024-03-13 LAB — COMPREHENSIVE METABOLIC PANEL WITH GFR
ALT: 19 U/L (ref 0–44)
AST: 23 U/L (ref 15–41)
Albumin: 3.8 g/dL (ref 3.5–5.0)
Alkaline Phosphatase: 61 U/L (ref 38–126)
Anion gap: 7 (ref 5–15)
BUN: 19 mg/dL (ref 8–23)
CO2: 28 mmol/L (ref 22–32)
Calcium: 9.3 mg/dL (ref 8.9–10.3)
Chloride: 106 mmol/L (ref 98–111)
Creatinine, Ser: 0.67 mg/dL (ref 0.44–1.00)
GFR, Estimated: >60 mL/min (ref >60)
Glucose, Bld: 78 mg/dL (ref 70–99)
Potassium: 4.4 mmol/L (ref 3.5–5.1)
Sodium: 141 mmol/L (ref 135–145)
Total Bilirubin: 0.7 mg/dL (ref 0.0–1.2)
Total Protein: 7.1 g/dL (ref 6.5–8.1)

## 2024-03-13 LAB — PROTIME-INR
INR: 1.4 — ABNORMAL HIGH (ref 0.8–1.2)
Prothrombin Time: 17.8 s — ABNORMAL HIGH (ref 11.4–15.2)

## 2024-03-13 LAB — SURGICAL PCR SCREEN

## 2024-03-13 LAB — TYPE AND SCREEN
ABO/RH(D): A POS
Antibody Screen: NEGATIVE

## 2024-03-13 LAB — APTT: aPTT: 49 s — ABNORMAL HIGH (ref 24–36)

## 2024-03-13 NOTE — Progress Notes (Addendum)
 PCP - Butler Pickard,MD Cardiologist - Soyla Acharya,MD LOV-02/24/24 EP: Eulas Nancey COME LOV 01/09/24  PPM/ICD - denies Device Orders -  Rep Notified -   Chest x-ray - 05/19/23 EKG - 02/24/24 Stress Test - denies ECHO - 08/18/22 Cardiac Cath - denies  Sleep Study - denies CPAP -   Fasting Blood Sugar - na Checks Blood Sugar _____ times a day  Last dose of GLP1 agonist-  na GLP1 instructions: na  Blood Thinner Instructions:per Dr. Aleta STOP taking your dabigatran  (PRADAXA ) three days prior to surgery. Your last dose will be October 30th. Instructions. PT/INR 17.8/1.4,PTT 49 on PAT labs 03/13/24. Secure chat sent to Alan Glance RN in Dr. Aleta office.  Aspirin Instructions:per Dr. Aleta instructions ,you may continue taking your aspirin.   ERAS Protcol -NPO PRE-SURGERY Ensure or G2-   COVID TEST- na   Anesthesia review: yes- cardiac clearance-hx SVT ablation,afib ablation.   Patient denies shortness of breath, fever, cough and chest pain at PAT appointment   All instructions explained to the patient, with a verbal understanding of the material. Patient agrees to go over the instructions while at home for a better understanding. The opportunity to ask questions was provided.

## 2024-03-14 NOTE — Progress Notes (Signed)
 Anesthesia Chart Review:  Case: [id number] Date/Time: 03/19/24 1039   Procedure: INSERTION, ENDOVASCULAR STENT GRAFT, AORTA, THORACIC   Anesthesia type: General   Diagnosis: Aneurysm of descending thoracic aorta without rupture [I71.23]   Pre-op  diagnosis: AAA w/o rupture   [facility] / [facility]   Surgeons: [name], MD       DISCUSSION: Patient is a 73 year old female scheduled for the above procedure.  History includes never smoker, atrial fibrillation (diagnosed ~08/2022, s/p DCCV 09/09/2022, s/p afib ablation 02/16/2023), SVT (s/p adenosine per EMS 05/19/2023, 09/15/2023; s/p SVT ablation 12/09/2023), type B aortic dissection (diagnosed 08/17/2022, with ascending and descending aneurysmal degeneration), GERD (s/p Nissen fundoplication 1999), hiatal hernia, HLD, childhood anemia.  Per 03/08/2024 vascular visit with Dr. Silver for chronic typic B aortic dissection, Most recent CT was independently reviewed.  While she did not have size change for over 18 months, on the most recent noncontrasted CT scan, the aorta has dilated nearly a centimeter to 5.8 cm from 4.9 cm over 6 months.... would benefit from endovascular operative repair for rapidly expanding aneurysm.  After discussing with my colleagues, I think that she is an excellent candidate for thoracic branch endoprosthesis.  Last cardiology visit was on 02/24/2024 with Dr. Acharya for preoperative evalatuion. She was seen by EP APP on 8/25/025 and at [facility] on 12/16/2023. CCTA on 03/02/2024 showed CAC of 0, normal coronaries, 4.0 ascending thoracic aorta. 08/18/2022 TTE showed LVEF 60 to 65%, no regional wall motion abnormalities, normal RV systolic function, normal PASP, RVSP 26.5 mmHg, mild to moderately dilated LA, mild MR, mild to moderate TR. CHA2DS2-VASc Score = 3 . She is now > 3 months post SVT ablation. EKG showed NSR with 1st degree AV block on 02/24/2024.  Per VVS instructions, hold Pradaxa  for 3 days prior to surgery.    03/13/2024 labs showed PT/INR 17.8/1.4 and PTT 49. She was still on Pradaxa  which can correlate with prolonged aPTT times. She is scheduled to hold Pradaxa  after 03/15/2024 dose. Will defer decision to repeat any labs to VVS.   Anesthesia team to evaluate on the day of surgery.    VS: BP 136/68   Pulse 77   Temp 36.6 C   Resp 17   Ht 5' 2 (1.575 m)   Wt 63 kg   LMP  (LMP Unknown)   SpO2 99%   BMI 25.42 kg/m    PROVIDERS: [name], MD is PCP Loni Rushing, MD is cardiologist Mealor, Eulas, MD is EP cardiologist Lanis Fonda, MD is vascular surgeon   LABS: Preoperative labs noted. See DISCUSSION.  (all labs ordered are listed, but only abnormal results are displayed)  Labs Reviewed  SURGICAL PCR SCREEN - Abnormal; Notable for the following components:      Result Value   MRSA, PCR   (*)    Value: INVALID, UNABLE TO DETERMINE THE PRESENCE OF TARGET DUE TO SPECIMEN INTEGRITY. RECOLLECTION REQUESTED.   Staphylococcus aureus   (*)    Value: INVALID, UNABLE TO DETERMINE THE PRESENCE OF TARGET DUE TO SPECIMEN INTEGRITY. RECOLLECTION REQUESTED.   All other components within normal limits  PROTIME-INR - Abnormal; Notable for the following components:   Prothrombin Time 17.8 (*)    INR 1.4 (*)    All other components within normal limits  APTT - Abnormal; Notable for the following components:   aPTT 49 (*)    All other components within normal limits  CBC  COMPREHENSIVE METABOLIC PANEL WITH  GFR  URINALYSIS, ROUTINE W REFLEX MICROSCOPIC  TYPE AND SCREEN     IMAGES: CTA Chest/abd/pelvis 03/02/2024: IMPRESSION: 1. Unchanged partially thrombosed chronic aneurysmal dissection of the distal thoracic aortic arch measuring up to 5.7 x 5.5 cm in caliber. 2. Unchanged enlargement of the tubular ascending thoracic aorta measuring up to 4.3 x 4.1 cm. 3. Unchanged enlargement of the descending thoracic aorta measuring 3.3 x 3.3 cm in the midportion of the  vessel. 4. No evidence of abdominal aortic aneurysm, dissection, or other acute aortic pathology. 5. Pelvic left kidney. 6. Sigmoid diverticulosis without evidence of acute diverticulitis.    EKG: 02/24/2024: Sinus rhythm with 1st degree A-V block When compared with ECG of 09-Jan-2024 14:24, PR interval has increased Criteria for Anterior infarct are no longer Present Confirmed by Loni Rushing ([id number]) on 02/24/2024 10:31:13 AM   CV: CTA Coronary 03/02/2024: IMPRESSION: 1. Coronary calcium score of 0. This was 0 percentile for age-, sex, and race-matched controls. 2. Normal coronary arteries.  CAD-RADS 0. 3. Normal coronary origin with right dominance. 4. Ascending aorta mildly dilated. 4.0 cm. Aortic atheroscleroses. Descending aorta lumen irregularities. Reported history of Type B dissection. Cannot rule out intramural hematoma. RECOMMENDATIONS: CAD-RADS 0: No evidence of CAD (0%). Consider non-atherosclerotic causes of chest pain.   Echo 08/18/2022: IMPRESSIONS   1. Left ventricular ejection fraction, by estimation, is 60 to 65%. The  left ventricle has normal function. The left ventricle has no regional  wall motion abnormalities. Left ventricular diastolic function could not  be evaluated.   2. Right ventricular systolic function is normal. The right ventricular  size is normal. There is normal pulmonary artery systolic pressure. The  estimated right ventricular systolic pressure is 26.5 mmHg.   3. Left atrial size was mild to moderately dilated.   4. Right atrial size was mildly dilated.   5. The mitral valve is normal in structure. Mild mitral valve  regurgitation. No evidence of mitral stenosis.   6. Tricuspid valve regurgitation is mild to moderate.   7. The aortic valve is tricuspid. Aortic valve regurgitation is not  visualized. No aortic stenosis is present.   8. The inferior vena cava is dilated in size with >50% respiratory  variability, suggesting right  atrial pressure of 8 mmHg.    Past Medical History:  Diagnosis Date   Adenomatous colon polyp    Allergy    Anemia    as a child, no current problem   Arrhythmia    Atrial fibrillation (HCC)    Blood transfusion without reported diagnosis @ 9 months   9 months old   Cataract    bilateral - MD just watching    Dysrhythmia    A. Fib   GERD (gastroesophageal reflux disease)    History of hiatal hernia    Hyperlipidemia    Osteoporosis 04/15/2016   Peripheral vascular disease    Type B Aortic Dissection   Skin cancer 2013   Skin cancer- Nose, face    Past Surgical History:  Procedure Laterality Date   ATRIAL FIBRILLATION ABLATION N/A 02/16/2023   Procedure: ATRIAL FIBRILLATION ABLATION;  Surgeon: [name], MD;  [facility];  Service: Cardiovascular;  Laterality: N/A;   CARDIOVERSION N/A 09/09/2022   Procedure: CARDIOVERSION;  Surgeon: [name], MD;  [facility];  Service: Cardiovascular;  Laterality: N/A;   CESAREAN SECTION  1981   x 1   CESAREAN SECTION     COLONOSCOPY  2016  hx polyp, tics, sigmoid colon mod tortuous Dr [name],    EYE SURGERY Bilateral    retina repair x 2   LAPAROTOMY  1979   removal of ovarian cyst   nissan fundiplication  05/17/1997   REPAIR OF COMPLEX TRACTION RETINAL DETACHMENT Bilateral 01/2019   SVT ABLATION N/A 12/09/2023   Procedure: SVT ABLATION;  Surgeon: [name], MD;  [facility];  Service: Cardiovascular;  Laterality: N/A;    MEDICATIONS:  acetaminophen  (TYLENOL ) 500 MG tablet   alendronate  (FOSAMAX ) 70 MG tablet   Ascorbic Acid (VITAMIN C) 1000 MG tablet   Biotin 1000 MCG tablet   Calcium Carbonate-Vit D-Min (CALCIUM 1200 PO)   Calcium Polycarbophil (FIBER-CAPS PO)   cetirizine (ZYRTEC) 10 MG tablet   Cholecalciferol (VITAMIN D3) 125 MCG (5000 UT) TABS   Cranberry 500 MG CHEW   cyclobenzaprine  (FLEXERIL ) 10 MG tablet   dabigatran  (PRADAXA ) 150 MG CAPS  capsule   docusate sodium  (COLACE) 100 MG capsule   famotidine  (PEPCID ) 20 MG tablet   furosemide  (LASIX ) 40 MG tablet   L-Lysine 500 MG CAPS   Lidocaine  (SALONPAS PAIN RELIEVING EX)   Menthol-Methyl Salicylate (SALONPAS PAIN RELIEF PATCH EX)   metoprolol  succinate (TOPROL  XL) 25 MG 24 hr tablet   Multiple Vitamin (MULTIVITAMIN WITH MINERALS) TABS tablet   omeprazole (PRILOSEC OTC) 20 MG tablet   Probiotic Product (PROBIOTIC PO)   No current facility-administered medications for this encounter.    Isaiah Ruder, PA-C Surgical Short Stay/Anesthesiology [facility] Phone [phone] [facility] Phone [phone] 03/14/2024 4:07 PM

## 2024-03-14 NOTE — Anesthesia Preprocedure Evaluation (Addendum)
 Anesthesia Evaluation  Patient identified by MRN, date of birth, ID band Patient awake    Reviewed: Allergy & Precautions, NPO status , Patient's Chart, lab work & pertinent test results, reviewed documented beta blocker date and time   History of Anesthesia Complications Negative for: history of anesthetic complications  Airway Mallampati: III  TM Distance: >3 FB Neck ROM: Full    Dental no notable dental hx. (+) Teeth Intact, Dental Advisory Given   Pulmonary neg pulmonary ROS   Pulmonary exam normal breath sounds clear to auscultation       Cardiovascular + Peripheral Vascular Disease  Normal cardiovascular exam+ dysrhythmias Atrial Fibrillation and Supra Ventricular Tachycardia  Rhythm:Regular Rate:Normal  Hx of chronic type B dissection now with aneurysmal areas of concern   CTA Coronary 03/02/2024: IMPRESSION: 1. Coronary calcium score of 0. This was 0 percentile for age-, sex, and race-matched controls. 2. Normal coronary arteries.  CAD-RADS 0. 3. Normal coronary origin with right dominance. 4. Ascending aorta mildly dilated. 4.0 cm. Aortic atheroscleroses. Descending aorta lumen irregularities. Reported history of Type B dissection. Cannot rule out intramural hematoma. RECOMMENDATIONS: CAD-RADS 0: No evidence of CAD (0%). Consider non-atherosclerotic causes of chest pain.   Echo 08/18/2022: IMPRESSIONS   1. Left ventricular ejection fraction, by estimation, is 60 to 65%. The  left ventricle has normal function. The left ventricle has no regional  wall motion abnormalities. Left ventricular diastolic function could not  be evaluated.   2. Right ventricular systolic function is normal. The right ventricular  size is normal. There is normal pulmonary artery systolic pressure. The  estimated right ventricular systolic pressure is 26.5 mmHg.   3. Left atrial size was mild to moderately dilated.   4. Right atrial size  was mildly dilated.   5. The mitral valve is normal in structure. Mild mitral valve  regurgitation. No evidence of mitral stenosis.   6. Tricuspid valve regurgitation is mild to moderate.   7. The aortic valve is tricuspid. Aortic valve regurgitation is not  visualized. No aortic stenosis is present.   8. The inferior vena cava is dilated in size with >50% respiratory  variability, suggesting right atrial pressure of 8 mmHg.     Neuro/Psych neg Seizures negative neurological ROS  negative psych ROS   GI/Hepatic Neg liver ROS, hiatal hernia,GERD  ,,  Endo/Other  negative endocrine ROS    Renal/GU negative Renal ROS  negative genitourinary   Musculoskeletal negative musculoskeletal ROS (+)    Abdominal   Peds  Hematology  (+) Blood dyscrasia (pradaxa )   Anesthesia Other Findings   Reproductive/Obstetrics                              Anesthesia Physical Anesthesia Plan  ASA: 3  Anesthesia Plan: General   Post-op Pain Management:    Induction: Intravenous  PONV Risk Score and Plan: 2 and Propofol  infusion, Treatment may vary due to age or medical condition and Ondansetron   Airway Management Planned: Oral ETT  Additional Equipment: Arterial line  Intra-op Plan:   Post-operative Plan: Extubation in OR  Informed Consent: I have reviewed the patients History and Physical, chart, labs and discussed the procedure including the risks, benefits and alternatives for the proposed anesthesia with the patient or authorized representative who has indicated his/her understanding and acceptance.     Dental advisory given  Plan Discussed with: CRNA  Anesthesia Plan Comments: (PAT note written 03/14/2024 by Allison Zelenak,  PA-C.  Patient is a 73 year old female scheduled for the above procedure.   History includes never smoker, atrial fibrillation (diagnosed ~08/2022, s/p DCCV 09/09/2022, s/p afib ablation 02/16/2023), SVT (s/p adenosine per EMS  05/19/2023, 09/15/2023; s/p SVT ablation 12/09/2023), type B aortic dissection (diagnosed 08/17/2022, with ascending and descending aneurysmal degeneration), GERD (s/p Nissen fundoplication 1999), hiatal hernia, HLD, childhood anemia.   Per 03/08/2024 vascular visit with Dr. Silver for chronic typic B aortic dissection, Most recent CT was independently reviewed.  While she did not have size change for over 18 months, on the most recent noncontrasted CT scan, the aorta has dilated nearly a centimeter to 5.8 cm from 4.9 cm over 6 months.... would benefit from endovascular operative repair for rapidly expanding aneurysm.  After discussing with my colleagues, I think that she is an excellent candidate for thoracic branch endoprosthesis.   Last cardiology visit was on 02/24/2024 with Dr. Acharya for preoperative evalatuion. She was seen by EP APP on 8/25/025 and at the [facility] on 12/16/2023. CCTA on 03/02/2024 showed CAC of 0, normal coronaries, 4.0 ascending thoracic aorta. 08/18/2022 TTE showed LVEF 60 to 65%, no regional wall motion abnormalities, normal RV systolic function, normal PASP, RVSP 26.5 mmHg, mild to moderately dilated LA, mild MR, mild to moderate TR. CHA2DS2-VASc Score = 3 . She is now > 3 months post SVT ablation. EKG showed NSR with 1st degree AV block on 02/24/2024.  Per VVS instructions, hold Pradaxa  for 3 days prior to surgery.    03/13/2024 labs showed PT/INR 17.8/1.4 and PTT 49. She was still on Pradaxa  which can correlate with prolonged aPTT times. She is scheduled to hold Pradaxa  after 03/15/2024 dose. Will defer decision to repeat any labs to VVS.  )         Anesthesia Quick Evaluation

## 2024-03-19 ENCOUNTER — Inpatient Hospital Stay (HOSPITAL_COMMUNITY)

## 2024-03-19 ENCOUNTER — Inpatient Hospital Stay (HOSPITAL_COMMUNITY): Admitting: Anesthesiology

## 2024-03-19 ENCOUNTER — Inpatient Hospital Stay (HOSPITAL_COMMUNITY)
Admission: RE | Admit: 2024-03-19 | Discharge: 2024-03-23 | DRG: 209 | Disposition: A | Discharge status: Rehabilitation Facility | Attending: Vascular Surgery | Admitting: Vascular Surgery

## 2024-03-19 ENCOUNTER — Encounter (HOSPITAL_COMMUNITY): Payer: Self-pay | Admitting: Vascular Surgery

## 2024-03-19 ENCOUNTER — Other Ambulatory Visit: Payer: Self-pay

## 2024-03-19 ENCOUNTER — Inpatient Hospital Stay (HOSPITAL_COMMUNITY): Payer: Self-pay | Admitting: Vascular Surgery

## 2024-03-19 ENCOUNTER — Encounter (HOSPITAL_COMMUNITY)
Admission: RE | Disposition: A | Discharge status: Rehabilitation Facility | Payer: Self-pay | Source: Home / Self Care | Attending: Vascular Surgery

## 2024-03-19 DIAGNOSIS — I10 Essential (primary) hypertension: Secondary | ICD-10-CM | POA: Diagnosis present

## 2024-03-19 DIAGNOSIS — I4819 Other persistent atrial fibrillation: Secondary | ICD-10-CM | POA: Diagnosis not present

## 2024-03-19 DIAGNOSIS — R2681 Unsteadiness on feet: Secondary | ICD-10-CM | POA: Diagnosis not present

## 2024-03-19 DIAGNOSIS — M810 Age-related osteoporosis without current pathological fracture: Secondary | ICD-10-CM | POA: Diagnosis present

## 2024-03-19 DIAGNOSIS — I71012 Dissection of descending thoracic aorta: Secondary | ICD-10-CM | POA: Diagnosis present

## 2024-03-19 DIAGNOSIS — R4701 Aphasia: Secondary | ICD-10-CM | POA: Diagnosis present

## 2024-03-19 DIAGNOSIS — I7123 Aneurysm of the descending thoracic aorta, without rupture: Secondary | ICD-10-CM

## 2024-03-19 DIAGNOSIS — J9811 Atelectasis: Secondary | ICD-10-CM | POA: Diagnosis not present

## 2024-03-19 DIAGNOSIS — I742 Embolism and thrombosis of arteries of the upper extremities: Secondary | ICD-10-CM | POA: Diagnosis not present

## 2024-03-19 DIAGNOSIS — R29700 NIHSS score 0: Secondary | ICD-10-CM | POA: Diagnosis not present

## 2024-03-19 DIAGNOSIS — I6340 Cerebral infarction due to embolism of unspecified cerebral artery: Principal | ICD-10-CM | POA: Diagnosis not present

## 2024-03-19 DIAGNOSIS — Z823 Family history of stroke: Secondary | ICD-10-CM | POA: Diagnosis not present

## 2024-03-19 DIAGNOSIS — E785 Hyperlipidemia, unspecified: Secondary | ICD-10-CM | POA: Diagnosis present

## 2024-03-19 DIAGNOSIS — M7918 Myalgia, other site: Secondary | ICD-10-CM | POA: Diagnosis not present

## 2024-03-19 DIAGNOSIS — Y838 Other surgical procedures as the cause of abnormal reaction of the patient, or of later complication, without mention of misadventure at the time of the procedure: Secondary | ICD-10-CM | POA: Diagnosis not present

## 2024-03-19 DIAGNOSIS — Z7902 Long term (current) use of antithrombotics/antiplatelets: Secondary | ICD-10-CM

## 2024-03-19 DIAGNOSIS — I712 Thoracic aortic aneurysm, without rupture, unspecified: Secondary | ICD-10-CM | POA: Diagnosis present

## 2024-03-19 DIAGNOSIS — I7776 Dissection of artery of upper extremity: Secondary | ICD-10-CM | POA: Diagnosis not present

## 2024-03-19 DIAGNOSIS — I4891 Unspecified atrial fibrillation: Secondary | ICD-10-CM | POA: Diagnosis present

## 2024-03-19 DIAGNOSIS — I719 Aortic aneurysm of unspecified site, without rupture: Secondary | ICD-10-CM | POA: Diagnosis not present

## 2024-03-19 DIAGNOSIS — Z8249 Family history of ischemic heart disease and other diseases of the circulatory system: Secondary | ICD-10-CM | POA: Diagnosis not present

## 2024-03-19 DIAGNOSIS — J90 Pleural effusion, not elsewhere classified: Secondary | ICD-10-CM | POA: Diagnosis not present

## 2024-03-19 DIAGNOSIS — I6310 Cerebral infarction due to embolism of unspecified precerebral artery: Secondary | ICD-10-CM | POA: Diagnosis not present

## 2024-03-19 DIAGNOSIS — Z95828 Presence of other vascular implants and grafts: Secondary | ICD-10-CM | POA: Diagnosis not present

## 2024-03-19 DIAGNOSIS — Z833 Family history of diabetes mellitus: Secondary | ICD-10-CM

## 2024-03-19 DIAGNOSIS — Z7982 Long term (current) use of aspirin: Secondary | ICD-10-CM

## 2024-03-19 DIAGNOSIS — R918 Other nonspecific abnormal finding of lung field: Secondary | ICD-10-CM | POA: Diagnosis not present

## 2024-03-19 DIAGNOSIS — Z821 Family history of blindness and visual loss: Secondary | ICD-10-CM | POA: Diagnosis not present

## 2024-03-19 DIAGNOSIS — Z860101 Personal history of adenomatous and serrated colon polyps: Secondary | ICD-10-CM

## 2024-03-19 DIAGNOSIS — I97821 Postprocedural cerebrovascular infarction during other surgery: Secondary | ICD-10-CM | POA: Diagnosis not present

## 2024-03-19 DIAGNOSIS — R000 Tachycardia, unspecified: Secondary | ICD-10-CM | POA: Diagnosis not present

## 2024-03-19 DIAGNOSIS — K219 Gastro-esophageal reflux disease without esophagitis: Secondary | ICD-10-CM | POA: Diagnosis present

## 2024-03-19 DIAGNOSIS — Z8679 Personal history of other diseases of the circulatory system: Principal | ICD-10-CM

## 2024-03-19 DIAGNOSIS — D62 Acute posthemorrhagic anemia: Secondary | ICD-10-CM | POA: Diagnosis not present

## 2024-03-19 DIAGNOSIS — I639 Cerebral infarction, unspecified: Secondary | ICD-10-CM | POA: Diagnosis not present

## 2024-03-19 DIAGNOSIS — Z8261 Family history of arthritis: Secondary | ICD-10-CM | POA: Diagnosis not present

## 2024-03-19 DIAGNOSIS — I6389 Other cerebral infarction: Secondary | ICD-10-CM | POA: Diagnosis not present

## 2024-03-19 DIAGNOSIS — Z85828 Personal history of other malignant neoplasm of skin: Secondary | ICD-10-CM

## 2024-03-19 DIAGNOSIS — Z9889 Other specified postprocedural states: Secondary | ICD-10-CM | POA: Diagnosis not present

## 2024-03-19 DIAGNOSIS — R42 Dizziness and giddiness: Secondary | ICD-10-CM | POA: Diagnosis not present

## 2024-03-19 DIAGNOSIS — R0602 Shortness of breath: Secondary | ICD-10-CM | POA: Diagnosis not present

## 2024-03-19 DIAGNOSIS — Z818 Family history of other mental and behavioral disorders: Secondary | ICD-10-CM

## 2024-03-19 DIAGNOSIS — I739 Peripheral vascular disease, unspecified: Secondary | ICD-10-CM | POA: Diagnosis present

## 2024-03-19 DIAGNOSIS — I6349 Cerebral infarction due to embolism of other cerebral artery: Secondary | ICD-10-CM | POA: Diagnosis not present

## 2024-03-19 DIAGNOSIS — I71019 Dissection of thoracic aorta, unspecified: Secondary | ICD-10-CM | POA: Diagnosis present

## 2024-03-19 HISTORY — PX: FEMORAL ARTERY EXPLORATION: SHX5160

## 2024-03-19 HISTORY — PX: THORACIC AORTIC ENDOVASCULAR STENT GRAFT: SHX6112

## 2024-03-19 HISTORY — PX: EMBOLECTOMY: SHX44

## 2024-03-19 HISTORY — PX: ANGIOPLASTY, ARTERY, SUBCLAVIAN, ENDOVASCULAR, WITH STENT INSERTION: SHX7699

## 2024-03-19 HISTORY — PX: ULTRASOUND GUIDANCE FOR VASCULAR ACCESS: SHX6516

## 2024-03-19 LAB — BASIC METABOLIC PANEL WITH GFR
Anion gap: 9 (ref 5–15)
BUN: 15 mg/dL (ref 8–23)
CO2: 23 mmol/L (ref 22–32)
Calcium: 8.1 mg/dL — ABNORMAL LOW (ref 8.9–10.3)
Chloride: 106 mmol/L (ref 98–111)
Creatinine, Ser: 0.6 mg/dL (ref 0.44–1.00)
GFR, Estimated: >60 mL/min (ref >60)
Glucose, Bld: 154 mg/dL — ABNORMAL HIGH (ref 70–99)
Potassium: 4.1 mmol/L (ref 3.5–5.1)
Sodium: 138 mmol/L (ref 135–145)

## 2024-03-19 LAB — ELECTROLYTE PANEL
Anion gap: 10 (ref 5–15)
CO2: 23 mmol/L (ref 22–32)
Chloride: 104 mmol/L (ref 98–111)
Potassium: 3.8 mmol/L (ref 3.5–5.1)
Sodium: 137 mmol/L (ref 135–145)

## 2024-03-19 LAB — CREATININE, SERUM
Creatinine, Ser: 0.58 mg/dL (ref 0.44–1.00)
GFR, Estimated: >60 mL/min (ref >60)

## 2024-03-19 LAB — CBC
HCT: 30 % — ABNORMAL LOW (ref 36.0–46.0)
HCT: 28.4 % — ABNORMAL LOW (ref 36.0–46.0)
Hemoglobin: 10 g/dL — ABNORMAL LOW (ref 12.0–15.0)
Hemoglobin: 9.7 g/dL — ABNORMAL LOW (ref 12.0–15.0)
MCH: 29.9 pg (ref 26.0–34.0)
MCH: 30.1 pg (ref 26.0–34.0)
MCHC: 33.3 g/dL (ref 30.0–36.0)
MCHC: 34.2 g/dL (ref 30.0–36.0)
MCV: 89.6 fL (ref 80.0–100.0)
MCV: 88.2 fL (ref 80.0–100.0)
Platelets: 175 K/uL (ref 150–400)
Platelets: 177 K/uL (ref 150–400)
RBC: 3.35 MIL/uL — ABNORMAL LOW (ref 3.87–5.11)
RBC: 3.22 MIL/uL — ABNORMAL LOW (ref 3.87–5.11)
RDW: 14.6 % (ref 11.5–15.5)
RDW: 14.5 % (ref 11.5–15.5)
WBC: 9.1 K/uL (ref 4.0–10.5)
WBC: 11.1 K/uL — ABNORMAL HIGH (ref 4.0–10.5)
nRBC: 0 % (ref 0.0–0.2)
nRBC: 0 % (ref 0.0–0.2)

## 2024-03-19 LAB — ABO/RH: ABO/RH(D): A POS

## 2024-03-19 LAB — SURGICAL PCR SCREEN
MRSA, PCR: NEGATIVE
Staphylococcus aureus: NEGATIVE

## 2024-03-19 LAB — APTT: aPTT: 34 s (ref 24–36)

## 2024-03-19 LAB — PROTIME-INR
INR: 1.3 — ABNORMAL HIGH (ref 0.8–1.2)
Prothrombin Time: 16.6 s — ABNORMAL HIGH (ref 11.4–15.2)

## 2024-03-19 LAB — MAGNESIUM: Magnesium: 1.8 mg/dL (ref 1.7–2.4)

## 2024-03-19 SURGERY — INSERTION, ENDOVASCULAR STENT GRAFT, AORTA, THORACIC
Anesthesia: General | Site: Groin | Laterality: Left

## 2024-03-19 MED ORDER — NITROGLYCERIN FOR UTERINE RELAXATION OPTIME 200MCG/ML
INTRAVENOUS | Status: DC | PRN
  Administered 2024-03-19: 400 ug via INTRAVENOUS

## 2024-03-19 MED ORDER — ASPIRIN 325 MG PO TABS
325.0000 mg | ORAL_TABLET | ORAL | Status: AC
  Administered 2024-03-19: 325 mg via ORAL
  Filled 2024-03-19: qty 1

## 2024-03-19 MED ORDER — LIDOCAINE 2% (20 MG/ML) 5 ML SYRINGE
INTRAMUSCULAR | Status: DC | PRN
  Administered 2024-03-19: 60 mg via INTRAVENOUS

## 2024-03-19 MED ORDER — CHLORHEXIDINE GLUCONATE 0.12 % MT SOLN
15.0000 mL | Freq: Once | OROMUCOSAL | Status: DC
  Filled 2024-03-19: qty 15

## 2024-03-19 MED ORDER — DEXAMETHASONE SOD PHOSPHATE PF 10 MG/ML IJ SOLN
INTRAMUSCULAR | Status: DC | PRN
  Administered 2024-03-19: 5 mg via INTRAVENOUS

## 2024-03-19 MED ORDER — ACETAMINOPHEN 325 MG RE SUPP: 325.0000 mg | RECTAL | Status: DC | PRN

## 2024-03-19 MED ORDER — CHLORHEXIDINE GLUCONATE CLOTH 2 % EX PADS
6.0000 | MEDICATED_PAD | Freq: Once | CUTANEOUS | Status: DC
Start: 2024-03-19 — End: 2024-03-19

## 2024-03-19 MED ORDER — CEFAZOLIN SODIUM-DEXTROSE 2-4 GM/100ML-% IV SOLN
2.0000 g | INTRAVENOUS | Status: AC
  Administered 2024-03-19: 2 g via INTRAVENOUS
  Filled 2024-03-19: qty 100

## 2024-03-19 MED ORDER — ALBUMIN HUMAN 5 % IV SOLN
12.5000 g | Freq: Once | INTRAVENOUS | Status: AC
  Administered 2024-03-19: 12.5 g via INTRAVENOUS

## 2024-03-19 MED ORDER — HEPARIN SODIUM (PORCINE) 5000 UNIT/ML IJ SOLN
5000.0000 [IU] | Freq: Two times a day (BID) | INTRAMUSCULAR | Status: DC
  Administered 2024-03-20: 5000 [IU] via SUBCUTANEOUS
  Filled 2024-03-19: qty 1

## 2024-03-19 MED ORDER — DROPERIDOL 2.5 MG/ML IJ SOLN: 0.6250 mg | Freq: Once | INTRAMUSCULAR | Status: DC | PRN

## 2024-03-19 MED ORDER — LACTATED RINGERS IV SOLN: INTRAVENOUS | Status: DC | PRN

## 2024-03-19 MED ORDER — OXYCODONE HCL 5 MG/5ML PO SOLN: 5.0000 mg | Freq: Once | ORAL | Status: DC | PRN

## 2024-03-19 MED ORDER — ACETAMINOPHEN 325 MG PO TABS
325.0000 mg | ORAL_TABLET | ORAL | Status: DC | PRN
  Administered 2024-03-20 – 2024-03-22 (×4): 650 mg via ORAL
  Filled 2024-03-19 (×4): qty 2

## 2024-03-19 MED ORDER — HEPARIN SODIUM (PORCINE) 1000 UNIT/ML IJ SOLN
INTRAMUSCULAR | Status: DC | PRN
  Administered 2024-03-19: 2000 [IU] via INTRAVENOUS
  Administered 2024-03-19 (×2): 3000 [IU] via INTRAVENOUS
  Administered 2024-03-19: 5000 [IU] via INTRAVENOUS
  Administered 2024-03-19: 4000 [IU] via INTRAVENOUS
  Administered 2024-03-19: 3000 [IU] via INTRAVENOUS
  Administered 2024-03-19: 2000 [IU] via INTRAVENOUS

## 2024-03-19 MED ORDER — MORPHINE SULFATE (PF) 2 MG/ML IV SOLN
2.0000 mg | INTRAVENOUS | Status: DC | PRN
  Administered 2024-03-19 – 2024-03-20 (×2): 2 mg via INTRAVENOUS
  Filled 2024-03-19 (×2): qty 1

## 2024-03-19 MED ORDER — LIDOCAINE 2% (20 MG/ML) 5 ML SYRINGE
INTRAMUSCULAR | Status: AC
Start: 2024-03-19 — End: 2024-03-19
  Filled 2024-03-19: qty 5

## 2024-03-19 MED ORDER — PHENOL 1.4 % MT LIQD: 1.0000 | OROMUCOSAL | Status: DC | PRN

## 2024-03-19 MED ORDER — PHENYLEPHRINE HCL-NACL 20-0.9 MG/250ML-% IV SOLN
INTRAVENOUS | Status: DC | PRN
Start: 2024-03-19 — End: 2024-03-19
  Administered 2024-03-19: 40 ug/min via INTRAVENOUS

## 2024-03-19 MED ORDER — OMEPRAZOLE MAGNESIUM 20 MG PO TBEC: 20.0000 mg | DELAYED_RELEASE_TABLET | Freq: Every day | ORAL | Status: DC | PRN

## 2024-03-19 MED ORDER — HEPARIN 6000 UNIT IRRIGATION SOLUTION
Status: DC | PRN
  Administered 2024-03-19: 2

## 2024-03-19 MED ORDER — PROPOFOL 10 MG/ML IV BOLUS
INTRAVENOUS | Status: AC
  Filled 2024-03-19: qty 20

## 2024-03-19 MED ORDER — OXYCODONE HCL 5 MG PO TABS
5.0000 mg | ORAL_TABLET | ORAL | Status: DC | PRN
  Administered 2024-03-19: 5 mg via ORAL
  Administered 2024-03-20 (×2): 10 mg via ORAL
  Administered 2024-03-20: 5 mg via ORAL
  Administered 2024-03-21 – 2024-03-22 (×3): 10 mg via ORAL
  Administered 2024-03-23: 5 mg via ORAL
  Filled 2024-03-19: qty 1
  Filled 2024-03-19: qty 2
  Filled 2024-03-19: qty 1
  Filled 2024-03-19 (×2): qty 2
  Filled 2024-03-19: qty 1
  Filled 2024-03-19 (×2): qty 2

## 2024-03-19 MED ORDER — NITROGLYCERIN IN D5W 200-5 MCG/ML-% IV SOLN
INTRAVENOUS | Status: AC
  Filled 2024-03-19: qty 250

## 2024-03-19 MED ORDER — HEPARIN 6000 UNIT IRRIGATION SOLUTION
Status: AC
  Filled 2024-03-19: qty 500

## 2024-03-19 MED ORDER — ORAL CARE MOUTH RINSE: 15.0000 mL | OROMUCOSAL | Status: DC | PRN

## 2024-03-19 MED ORDER — PANTOPRAZOLE SODIUM 40 MG PO TBEC
40.0000 mg | DELAYED_RELEASE_TABLET | Freq: Every day | ORAL | Status: DC
  Administered 2024-03-19 – 2024-03-23 (×5): 40 mg via ORAL
  Filled 2024-03-19 (×5): qty 1

## 2024-03-19 MED ORDER — SODIUM CHLORIDE 0.9 % IV SOLN
INTRAVENOUS | Status: DC
Start: 2024-03-19 — End: 2024-03-19

## 2024-03-19 MED ORDER — SUGAMMADEX SODIUM 200 MG/2ML IV SOLN
INTRAVENOUS | Status: DC | PRN
  Administered 2024-03-19: 50 mg via INTRAVENOUS
  Administered 2024-03-19: 100 mg via INTRAVENOUS

## 2024-03-19 MED ORDER — BISACODYL 5 MG PO TBEC: 5.0000 mg | DELAYED_RELEASE_TABLET | Freq: Every day | ORAL | Status: DC | PRN

## 2024-03-19 MED ORDER — ALBUMIN HUMAN 5 % IV SOLN
INTRAVENOUS | Status: AC
  Filled 2024-03-19: qty 250

## 2024-03-19 MED ORDER — PROTAMINE SULFATE 10 MG/ML IV SOLN
INTRAVENOUS | Status: DC | PRN
  Administered 2024-03-19 (×2): 50 mg via INTRAVENOUS

## 2024-03-19 MED ORDER — ASPIRIN 81 MG PO TBEC
81.0000 mg | DELAYED_RELEASE_TABLET | Freq: Every day | ORAL | Status: DC
  Administered 2024-03-20 – 2024-03-23 (×4): 81 mg via ORAL
  Filled 2024-03-19 (×4): qty 1

## 2024-03-19 MED ORDER — FENTANYL CITRATE (PF) 100 MCG/2ML IJ SOLN: 25.0000 ug | INTRAMUSCULAR | Status: DC | PRN

## 2024-03-19 MED ORDER — IODIXANOL 320 MG/ML IV SOLN
INTRAVENOUS | Status: DC | PRN
  Administered 2024-03-19: 159 mL via INTRA_ARTERIAL

## 2024-03-19 MED ORDER — METOPROLOL TARTRATE 5 MG/5ML IV SOLN
2.5000 mg | INTRAVENOUS | Status: DC | PRN
  Administered 2024-03-21: 5 mg via INTRAVENOUS
  Filled 2024-03-19: qty 5

## 2024-03-19 MED ORDER — OXYCODONE HCL 5 MG PO TABS: 5.0000 mg | ORAL_TABLET | Freq: Once | ORAL | Status: DC | PRN

## 2024-03-19 MED ORDER — POTASSIUM CHLORIDE CRYS ER 20 MEQ PO TBCR: 40.0000 meq | EXTENDED_RELEASE_TABLET | Freq: Every day | ORAL | Status: DC | PRN

## 2024-03-19 MED ORDER — ONDANSETRON HCL 4 MG/2ML IJ SOLN
INTRAMUSCULAR | Status: DC | PRN
  Administered 2024-03-19 (×2): 4 mg via INTRAVENOUS

## 2024-03-19 MED ORDER — FAMOTIDINE 20 MG PO TABS
20.0000 mg | ORAL_TABLET | Freq: Every day | ORAL | Status: DC | PRN
  Administered 2024-03-19: 20 mg via ORAL
  Filled 2024-03-19: qty 1

## 2024-03-19 MED ORDER — POLYETHYLENE GLYCOL 3350 17 G PO PACK: 17.0000 g | PACK | Freq: Every day | ORAL | Status: DC | PRN

## 2024-03-19 MED ORDER — SODIUM CHLORIDE 0.9 % IV SOLN: INTRAVENOUS | Status: DC

## 2024-03-19 MED ORDER — CEFAZOLIN SODIUM-DEXTROSE 2-4 GM/100ML-% IV SOLN
2.0000 g | Freq: Three times a day (TID) | INTRAVENOUS | Status: AC
  Administered 2024-03-19 – 2024-03-20 (×2): 2 g via INTRAVENOUS
  Filled 2024-03-19 (×2): qty 100

## 2024-03-19 MED ORDER — ONDANSETRON HCL 4 MG/2ML IJ SOLN
INTRAMUSCULAR | Status: AC
Start: 2024-03-19 — End: 2024-03-19
  Filled 2024-03-19: qty 2

## 2024-03-19 MED ORDER — PROPOFOL 10 MG/ML IV BOLUS
INTRAVENOUS | Status: DC | PRN
  Administered 2024-03-19 (×2): 50 mg via INTRAVENOUS

## 2024-03-19 MED ORDER — CLOPIDOGREL BISULFATE 75 MG PO TABS
150.0000 mg | ORAL_TABLET | Freq: Once | ORAL | Status: AC
  Administered 2024-03-19: 150 mg via ORAL
  Filled 2024-03-19: qty 2

## 2024-03-19 MED ORDER — CYCLOBENZAPRINE HCL 10 MG PO TABS
10.0000 mg | ORAL_TABLET | Freq: Three times a day (TID) | ORAL | Status: DC | PRN
  Administered 2024-03-19 – 2024-03-23 (×7): 10 mg via ORAL
  Filled 2024-03-19 (×7): qty 1

## 2024-03-19 MED ORDER — 0.9 % SODIUM CHLORIDE (POUR BTL) OPTIME
TOPICAL | Status: DC | PRN
Start: 2024-03-19 — End: 2024-03-19
  Administered 2024-03-19: 2000 mL

## 2024-03-19 MED ORDER — FENTANYL CITRATE (PF) 250 MCG/5ML IJ SOLN
INTRAMUSCULAR | Status: AC
  Filled 2024-03-19: qty 5

## 2024-03-19 MED ORDER — FENTANYL CITRATE (PF) 250 MCG/5ML IJ SOLN
INTRAMUSCULAR | Status: DC | PRN
  Administered 2024-03-19: 100 ug via INTRAVENOUS
  Administered 2024-03-19 (×2): 50 ug via INTRAVENOUS

## 2024-03-19 MED ORDER — DOCUSATE SODIUM 100 MG PO CAPS
100.0000 mg | ORAL_CAPSULE | Freq: Every morning | ORAL | Status: DC
  Administered 2024-03-20 – 2024-03-23 (×4): 100 mg via ORAL
  Filled 2024-03-19 (×4): qty 1

## 2024-03-19 MED ORDER — ROCURONIUM BROMIDE 10 MG/ML (PF) SYRINGE
PREFILLED_SYRINGE | INTRAVENOUS | Status: AC
Start: 2024-03-19 — End: 2024-03-19
  Filled 2024-03-19: qty 10

## 2024-03-19 MED ORDER — PANTOPRAZOLE SODIUM 40 MG PO TBEC: 40.0000 mg | DELAYED_RELEASE_TABLET | Freq: Every day | ORAL | Status: DC | PRN

## 2024-03-19 MED ORDER — ACETAMINOPHEN 10 MG/ML IV SOLN: 1000.0000 mg | Freq: Once | INTRAVENOUS | Status: DC | PRN

## 2024-03-19 MED ORDER — ROCURONIUM BROMIDE 10 MG/ML (PF) SYRINGE
PREFILLED_SYRINGE | INTRAVENOUS | Status: DC | PRN
  Administered 2024-03-19: 10 mg via INTRAVENOUS
  Administered 2024-03-19: 60 mg via INTRAVENOUS
  Administered 2024-03-19: 30 mg via INTRAVENOUS
  Administered 2024-03-19: 10 mg via INTRAVENOUS

## 2024-03-19 MED ORDER — PHENYLEPHRINE 80 MCG/ML (10ML) SYRINGE FOR IV PUSH (FOR BLOOD PRESSURE SUPPORT)
PREFILLED_SYRINGE | INTRAVENOUS | Status: DC | PRN
  Administered 2024-03-19: 80 ug via INTRAVENOUS
  Administered 2024-03-19: 160 ug via INTRAVENOUS

## 2024-03-19 SURGICAL SUPPLY — 83 items
BAG COUNTER SPONGE SURGICOUNT (BAG) ×2 IMPLANT
BAG DECANTER FOR FLEXI CONT (MISCELLANEOUS) IMPLANT
BAG SNAP BAND KOVER 36X36 (MISCELLANEOUS) IMPLANT
BALLOON MUSTANG 4X60X135 (BALLOONS) IMPLANT
BALLOON MUSTANG 5.0X40 135 (BALLOONS) IMPLANT
BALLOON MUSTANG 8.0X40 135 (BALLOONS) IMPLANT
BLADE SURG 10 STRL SS (BLADE) IMPLANT
BLADE SURG 11 STRL SS (BLADE) IMPLANT
BLADE SURG 15 STRL LF DISP TIS (BLADE) IMPLANT
CANISTER SUCTION 3000ML PPV (SUCTIONS) ×2 IMPLANT
CATH ACCU-VU SIZ PIG 5F 100CM (CATHETERS) IMPLANT
CATH ANGIO 5F BER2 100CM (CATHETERS) IMPLANT
CATH BEACON 5 .035 65 KMP TIP (CATHETERS) IMPLANT
CATH NAVICROSS ANGLED 135CM (MICROCATHETER) IMPLANT
CATH POLY DUAL LUMEN OCCL 5FR (CATHETERS) IMPLANT
CATH VANCHIE3 BT 5F .035X125 (CATHETERS) IMPLANT
CLAMP SUTURE YELLOW 5 PAIRS (MISCELLANEOUS) IMPLANT
CLIP TI MEDIUM 6 (CLIP) ×2 IMPLANT
CLIP TI WIDE RED SMALL 6 (CLIP) ×2 IMPLANT
CLOSURE PERCLOSE PROSTYLE (Vascular Products) IMPLANT
CNTNR URN SCR LID CUP LEK RST (MISCELLANEOUS) IMPLANT
COVER MAYO STAND STRL (DRAPES) IMPLANT
COVER PROBE W GEL 5X96 (DRAPES) IMPLANT
DERMABOND ADVANCED .7 DNX12 (GAUZE/BANDAGES/DRESSINGS) ×2 IMPLANT
DEVICE CLOSURE MYNXGRIP 5F (Vascular Products) IMPLANT
DEVICE CLOSURE PERCLS PRGLD 6F (Vascular Products) ×4 IMPLANT
DEVICE ENSNARE 12MMX20MM (VASCULAR PRODUCTS) IMPLANT
DEVICE RAD COMP TR BAND LRG (VASCULAR PRODUCTS) IMPLANT
DRAPE BRACHIAL (DRAPES) IMPLANT
DRAPE FEMORAL ANGIO 80X135IN (DRAPES) IMPLANT
DRAPE HALF SHEET 40X57 (DRAPES) IMPLANT
DRAPE INCISE IOBAN 66X45 STRL (DRAPES) IMPLANT
DRSG TEGADERM 2-3/8X2-3/4 SM (GAUZE/BANDAGES/DRESSINGS) ×2 IMPLANT
ELECTRODE REM PT RTRN 9FT ADLT (ELECTROSURGICAL) ×4 IMPLANT
ENDOPROSTHESIS THORAC 37X15X8 (Endovascular Graft) IMPLANT
ENDOPROTH THORACIC 12X6X8 14F (Endovascular Graft) IMPLANT
GAUZE 4X4 16PLY ~~LOC~~+RFID DBL (SPONGE) IMPLANT
GLIDEWIRE ADV .035X260CM (WIRE) IMPLANT
GLOVE BIOGEL PI IND STRL 8 (GLOVE) ×2 IMPLANT
GOWN STRL NON-REIN LRG LVL3 (GOWN DISPOSABLE) ×2 IMPLANT
GOWN STRL REUS W/ TWL LRG LVL3 (GOWN DISPOSABLE) ×2 IMPLANT
GOWN STRL REUS W/TWL 2XL LVL3 (GOWN DISPOSABLE) ×2 IMPLANT
GUIDEWIRE JAGWIRE PULMNRY .035 (MISCELLANEOUS) IMPLANT
KIT BASIN OR (CUSTOM PROCEDURE TRAY) ×2 IMPLANT
KIT ENCORE 26 ADVANTAGE (KITS) IMPLANT
KIT TURNOVER KIT B (KITS) ×2 IMPLANT
LOOP VASCLR MAXI BLUE 18IN ST (MISCELLANEOUS) IMPLANT
LOOP VESSEL MINI RED (MISCELLANEOUS) IMPLANT
PACK ENDOVASCULAR (PACKS) ×2 IMPLANT
PAD ARMBOARD POSITIONER FOAM (MISCELLANEOUS) ×4 IMPLANT
PENCIL SMOKE EVACUATOR (MISCELLANEOUS) IMPLANT
SET MICROPUNCTURE 5F STIFF (MISCELLANEOUS) ×2 IMPLANT
SHEATH AVANTI 11CM 8FR (SHEATH) ×2 IMPLANT
SHEATH DRYSEAL FLEX 22FR 33CM (SHEATH) IMPLANT
SHEATH PINNACLE 5F 10CM (SHEATH) IMPLANT
SHEATH PINNACLE 8F 10CM (SHEATH) IMPLANT
SLEEVE SURGEON STRL (DRAPES) IMPLANT
SOLN 0.9% NACL POUR BTL 1000ML (IV SOLUTION) ×4 IMPLANT
SPONGE T-LAP 18X18 ~~LOC~~+RFID (SPONGE) IMPLANT
STENT ELUVIA 6X40X130 (Permanent Stent) IMPLANT
STENT ELUVIA 6X60X130 (Permanent Stent) IMPLANT
STENT GRFT THORAC ACS 37X37X10 (Endovascular Graft) IMPLANT
STENT VIABAHN VBX 11X39X135 (Permanent Stent) IMPLANT
STOPCOCK MORSE 400PSI 3WAY (MISCELLANEOUS) ×2 IMPLANT
SUT MNCRL AB 4-0 PS2 18 (SUTURE) ×2 IMPLANT
SUT PROLENE 5 0 C 1 24 (SUTURE) IMPLANT
SUT PROLENE 6 0 BV (SUTURE) IMPLANT
SUT SILK 2-0 18XBRD TIE 12 (SUTURE) IMPLANT
SUT SILK 3-0 18XBRD TIE 12 (SUTURE) IMPLANT
SUT VIC AB 2-0 CTX 36 (SUTURE) IMPLANT
SUT VIC AB 3-0 SH 27X BRD (SUTURE) IMPLANT
SYR 30ML LL (SYRINGE) IMPLANT
SYR 3ML LL SCALE MARK (SYRINGE) IMPLANT
SYR 50ML LL SCALE MARK (SYRINGE) ×2 IMPLANT
SYR TOOMEY 50ML (SYRINGE) IMPLANT
TAPE STRIPS DRAPE STRL (GAUZE/BANDAGES/DRESSINGS) IMPLANT
TOWEL GREEN STERILE (TOWEL DISPOSABLE) ×2 IMPLANT
TRAY FOLEY MTR SLVR 14FR STAT (SET/KITS/TRAYS/PACK) IMPLANT
TUBING ART PRESS 72 MALE/FEM (TUBING) IMPLANT
TUBING HIGH PRESSURE 120CM (CONNECTOR) ×2 IMPLANT
TUBING INJECTOR 48 (MISCELLANEOUS) IMPLANT
WIRE BENTSON .035X145CM (WIRE) ×2 IMPLANT
WIRE STIFF LUNDERQUIST 260CM (WIRE) ×2 IMPLANT

## 2024-03-19 NOTE — H&P (Signed)
 Office Note   Patient seen and examined in preop holding.  No complaints. No changes to medication history or physical exam since last seen in clinic. After discussing the risks and benefits of Thoracic branch endoprosthesis for thoracic aortic aneurysm, Terri Alexander elected to proceed.   [name] MD   HPI: Terri Alexander is a 73 y.o. ([birth year]) female presenting in follow up with a known type B aortic dissection, now with aneurysmal degeneration  Originally from [place], her and her husband met at a bar when he was in the service.  They have a son who went to [facility], and stayed, starting a family here in [place].  They moved closer to him to be close to the grandchildren.    In April 2024, Terri underwent chest CT demonstrating chronic type B aortic dissection with some aneurysmal dilatation of the descending aorta.  The false lumen was thrombosed.  Follow-up was scheduled.  At her last visit, she had significant growth, therefore workup began for operative intervention.  She presents today to discuss recent CT findings. Denies chest pain, back pain, abdominal pain.    The pt is not on a statin for cholesterol management.  The pt is not on a daily aspirin.   Other AC:  eliquis  The pt is on medication for hypertension.   The pt is not diabetic.  Tobacco hx:  -  Past Medical History:  Diagnosis Date   Adenomatous colon polyp    Allergy    Anemia    as a child, no current problem   Arrhythmia    Atrial fibrillation (HCC)    Blood transfusion without reported diagnosis @ 9 months   9 months old   Cataract    bilateral - MD just watching    Dysrhythmia    A. Fib   GERD (gastroesophageal reflux disease)    History of hiatal hernia    Hyperlipidemia    Osteoporosis 04/15/2016   Peripheral vascular disease    Type B Aortic Dissection   Skin cancer 2013   Skin cancer- Nose, face    Past Surgical History:  Procedure Laterality Date   ATRIAL FIBRILLATION  ABLATION N/A 02/16/2023   Procedure: ATRIAL FIBRILLATION ABLATION;  Surgeon: Nancey Eulas FORBES, MD;  [facility];  Service: Cardiovascular;  Laterality: N/A;   CARDIOVERSION N/A 09/09/2022   Procedure: CARDIOVERSION;  Surgeon: Alvan Ronal FORBES, MD;  [facility];  Service: Cardiovascular;  Laterality: N/A;   CESAREAN SECTION  1981   x 1   CESAREAN SECTION     COLONOSCOPY  2016   hx polyp, tics, sigmoid colon mod tortuous Dr [name],    EYE SURGERY Bilateral    retina repair x 2   LAPAROTOMY  1979   removal of ovarian cyst   nissan fundiplication  05/17/1997   REPAIR OF COMPLEX TRACTION RETINAL DETACHMENT Bilateral 01/2019   SVT ABLATION N/A 12/09/2023   Procedure: SVT ABLATION;  Surgeon: Nancey Eulas FORBES, MD;  [facility];  Service: Cardiovascular;  Laterality: N/A;    Social History   Socioeconomic History   Marital status: Married    Spouse name: Not on file   Number of children: Not on file   Years of education: Not on file   Highest education level: 12th grade  Occupational History   Not on file  Tobacco Use   Smoking status: Never   Smokeless tobacco: Never   Tobacco comments:  Never smoked 12/16/23  Vaping Use   Vaping status: Never Used  Substance and Sexual Activity   Alcohol use: Yes    Alcohol/week: 1.0 standard drink of alcohol    Types: 1 Glasses of wine per week    Comment: rarely   Drug use: No   Sexual activity: Not Currently    Birth control/protection: Post-menopausal  Other Topics Concern   Not on file  Social History Narrative   Not on file   Social Drivers of Health   Financial Resource Strain: Low Risk  (11/14/2023)   Overall Financial Resource Strain (CARDIA)    Difficulty of Paying Living Expenses: Not hard at all  Food Insecurity: No Food Insecurity (11/14/2023)   Hunger Vital Sign    Worried About Running Out of Food in the Last Year: Never true    Ran Out of Food in the Last Year: Never  true  Transportation Needs: Unknown (11/14/2023)   PRAPARE - Administrator, Civil Service (Medical): No    Lack of Transportation (Non-Medical): Not on file  Physical Activity: Insufficiently Active (11/14/2023)   Exercise Vital Sign    Days of Exercise per Week: 3 days    Minutes of Exercise per Session: 30 min  Stress: No Stress Concern Present (11/14/2023)   [employer] of Occupational Health - Occupational Stress Questionnaire    Feeling of Stress: Only a little  Social Connections: Unknown (11/14/2023)   Social Connection and Isolation Panel    Frequency of Communication with Friends and Family: Twice a week    Frequency of Social Gatherings with Friends and Family: Once a week    Attends Religious Services: Not on Marketing Executive or Organizations: No    Attends Banker Meetings: Not on file    Marital Status: Married  Intimate Partner Violence: Unknown (06/30/2023)   Humiliation, Afraid, Rape, and Kick questionnaire    Fear of Current or Ex-Partner: No    Emotionally Abused: No    Physically Abused: No    Sexually Abused: Not on file   Family History  Problem Relation Age of Onset   Arthritis Mother    Heart disease Mother    Vision loss Mother    Stroke Mother    Arrhythmia Mother    Depression Sister    Transient ischemic attack Sister 40   Depression Sister    Cancer Maternal Grandmother    Cancer Maternal Grandfather    Early death Maternal Grandfather    Heart disease Maternal Grandfather    Early death Paternal Grandfather    Heart disease Paternal Grandfather    Diabetes Other        Juvenile   Arrhythmia Maternal Aunt    Colon cancer Neg Hx    Rectal cancer Neg Hx    Prostate cancer Neg Hx     Current Facility-Administered Medications  Medication Dose Route Frequency Provider Last Rate Last Admin   0.9 %  sodium chloride  infusion   Intravenous Continuous Abisai Coble E, MD       ceFAZolin (ANCEF) IVPB  2g/100 mL premix  2 g Intravenous 30 min Pre-Op  Phoenix Dresser E, MD       chlorhexidine  (PERIDEX ) 0.12 % solution 15 mL  15 mL Mouth/Throat Once Erma Thom SAUNDERS, MD       Chlorhexidine  Gluconate Cloth 2 % PADS 6 each  6 each Topical Once Allona Gondek E, MD  And   Chlorhexidine  Gluconate Cloth 2 % PADS 6 each  6 each Topical Once Leylany Nored E, MD        No Known Allergies   REVIEW OF SYSTEMS:  [X]  denotes positive finding, [ ]  denotes negative finding Cardiac  Comments:  Chest pain or chest pressure:    Shortness of breath upon exertion:    Short of breath when lying flat:    Irregular heart rhythm:        Vascular    Pain in calf, thigh, or hip brought on by ambulation:    Pain in feet at night that wakes you up from your sleep:     Blood clot in your veins:    Leg swelling:         Pulmonary    Oxygen at home:    Productive cough:     Wheezing:         Neurologic    Sudden weakness in arms or legs:     Sudden numbness in arms or legs:     Sudden onset of difficulty speaking or slurred speech:    Temporary loss of vision in one eye:     Problems with dizziness:         Gastrointestinal    Blood in stool:     Vomited blood:         Genitourinary    Burning when urinating:     Blood in urine:        Psychiatric    Major depression:         Hematologic    Bleeding problems:    Problems with blood clotting too easily:        Skin    Rashes or ulcers:        Constitutional    Fever or chills:      PHYSICAL EXAMINATION:  Vitals:   03/19/24 0911  BP: (!) 148/63  Pulse: 65  Resp: 17  Temp: 97.6 F (36.4 C)  TempSrc: Oral  SpO2: 99%  Weight: 62.1 kg  Height: 5' 2 (1.575 m)    General:  WDWN in NAD; vital signs documented above Gait: Not observed HENT: WNL, normocephalic Pulmonary: normal non-labored breathing , without wheezing Cardiac: regular HR Abdomen: soft, NT, no masses Skin: without rashes Vascular Exam/Pulses:  Right Left   Radial 2+ (normal) 2+ (normal)  Ulnar    Femoral    Popliteal    DP 2+ (normal) 2+ (normal)  PT     Extremities: without ischemic changes, without Gangrene , without cellulitis; without open wounds;  Musculoskeletal: no muscle wasting or atrophy  Neurologic: A&O X 3;  No focal weakness or paresthesias are detected Psychiatric:  The pt has Normal affect.   Non-Invasive Vascular Imaging:    See CT    ASSESSMENT/PLAN: ROSAELENA KEMNITZ is a 73 y.o. female presenting in follow-up with known type B aortic dissection with aneurysmal degeneration of the descending aorta.  Most recent CT was independently reviewed.  While she did not have size change for over 18 months, on the most recent noncontrasted CT scan, the aorta has dilated nearly a centimeter to 5.8 cm from 4.9 cm over 6 months.  CT angio chest abdomen pelvis was reviewed.  Bradycardia would benefit from endovascular operative repair for rapidly expanding aneurysm.  After discussing with my colleagues, I think that she is an excellent candidate for thoracic branch endoprosthesis.  I am worried that a simple TEVAR would have  difficulty landing appropriately due to the angulation at the apex of the aortic arch.  I had a long discussion with Kynnedi and her husband regarding her chronic type B aortic dissection with aneurysmal degeneration.  We discussed the risks and benefits of surgery.  After discussing these at length, Alvaretta elected to proceed.  Will schedule this in the coming weeks.   We discussed the signs and symptoms of acute aortic syndrome.  I asked her to call 911 should any new-onset back pain, chest pain occur.   [name], MD [facility] [phone] Total time of patient care including pre-visit research, consultation, and documentation greater than 30 minutes

## 2024-03-19 NOTE — Transfer of Care (Signed)
 Immediate Anesthesia Transfer of Care Note  Patient: Terri Alexander  Procedure(s) Performed: INSERTION, ENDOVASCULAR STENT GRAFT, AORTA, THORACIC; STENTING OF LEFT SUBCLAVIAN ARTERY (Left: Arm Upper) ULTRASOUND GUIDANCE, FOR VASCULAR ACCESS (Bilateral: Groin) EXPOSURE OF BRACHIAL ARTERY WITH STENT INSERTION (Left: Arm Upper) LEFT SUBCLAVIAN ARTERY STENT INSERTION (Left: Arm Upper)  Patient Location: PACU  Anesthesia Type:General  Level of Consciousness: drowsy and patient cooperative  Airway & Oxygen Therapy: Patient Spontanous Breathing and Patient connected to nasal cannula oxygen  Post-op Assessment: Report given to RN and Post -op Vital signs reviewed and stable  Post vital signs: Reviewed and stable  Last Vitals:  Vitals Value Taken Time  BP 105/72 03/19/24 16:39  Temp 36.7 C 03/19/24 16:38  Pulse 78 03/19/24 16:44  Resp 14 03/19/24 16:44  SpO2 100 % 03/19/24 16:44  Vitals shown include unfiled device data.  Last Pain:  Vitals:   03/19/24 0923  TempSrc:   PainSc: 4       Patients Stated Pain Goal: 3 (03/19/24 9076)  Complications: No notable events documented.

## 2024-03-19 NOTE — Anesthesia Procedure Notes (Signed)
 Arterial Line Insertion Start/End11/07/2023 9:55 AM, 03/19/2024 10:00 AM Performed by: Emmitt Millman, CRNA, CRNA  Patient location: Pre-op . Preanesthetic checklist: patient identified, IV checked, site marked, risks and benefits discussed, surgical consent, monitors and equipment checked, pre-op  evaluation, timeout performed and anesthesia consent Lidocaine  1% used for infiltration Left, radial was placed Catheter size: 20 G Hand hygiene performed , maximum sterile barriers used  and Seldinger technique used Allen's test indicative of satisfactory collateral circulation Attempts: 1 Procedure performed using ultrasound to evaluate access site (image not saved). Ultrasound Notes:relevant anatomy identified, ultrasound used to visualize needle entry and vessel patent under ultrasound. Following insertion, dressing applied. Post procedure assessment: normal and unchanged  Post procedure complications: local hematoma. Patient tolerated the procedure well with no immediate complications.

## 2024-03-19 NOTE — Anesthesia Procedure Notes (Signed)
 Procedure Name: Intubation Date/Time: 03/19/2024 10:58 AM  Performed by: Atanacio Arland HERO, CRNAPre-anesthesia Checklist: Patient identified, Emergency Drugs available, Suction available and Patient being monitored Patient Re-evaluated:Patient Re-evaluated prior to induction Oxygen Delivery Method: Circle System Utilized Preoxygenation: Pre-oxygenation with 100% oxygen Induction Type: IV induction Ventilation: Mask ventilation without difficulty Laryngoscope Size: Mac and 4 Grade View: Grade II Tube type: Oral Tube size: 7.0 mm Number of attempts: 1 Airway Equipment and Method: Stylet Placement Confirmation: ETT inserted through vocal cords under direct vision, positive ETCO2 and breath sounds checked- equal and bilateral Secured at: 21 cm Tube secured with: Tape Dental Injury: Teeth and Oropharynx as per pre-operative assessment

## 2024-03-19 NOTE — Plan of Care (Signed)

## 2024-03-19 NOTE — Op Note (Signed)
 NAME: Terri Alexander    MRN: [id number] DOB: [birth year]    DATE OF OPERATION: 03/19/2024  PREOP DIAGNOSIS:    Thoracic aortic aneurysm  POSTOP DIAGNOSIS:    Same  PROCEDURE:    65286- Percutaneous access bilateral common femoral arteries 33880-TEVAR, coverage of the left subclavian artery, with Gore thoracic branch endoprosthesis  - 37 x 37 x 15 thoracic branch endoprosthesis  - 12 mm x 6 cm left subclavian sidebranch endoprosthesis, reinforced with 11 x 59 VBX  - 37 x 37 x 10 cm extension in the descending thoracic aorta 95951-radiology interpretation of the thoracic aorta and first-order branches 36245 selective catheterization of left radial access Left upper extremity angiogram Left brachial artery cutdown, Fogarty embolectomy Primary repair of left brachial artery Stenting of the left subclavian artery, left axillary artery, left brachial artery 6x5mm,6,40mm  **Coder needs to contact Elouise Homans at [facility] [phone] ETAP fee and specific coding  SURGEON: [name]  ASSIST: Lonni Gaskins, MD, Sherrilee Holster, [place]  ANESTHESIA: General  EBL: 150 mL  INDICATIONS:    Terri Alexander is a 73 y.o. female with history of descending thoracic aortic aneurysm extending from zone 3, to zone 4.  The aneurysm grew rapidly necessitating repair.  After discussing the risk and benefits of more thoracic endoprosthesis as I did not think that she would get a seal with a standard graft, Ellia elected to proceed.  FINDINGS:   Widely patent first-order branches of the abdominal aorta Bovine arch involving the innominate and left carotid artery. The left subclavian artery was widely patent with vertebral artery filling antegrade. Widely patent innominate/right carotid ostia after TEVAR placement with left subclavian sidebranch.  I elected to extend distally due to some concern for 1B endoleak.   At case completion, the patient was noted to have a very poor signal in the left  arm.  I elected to reprep the patient and perform angiography as I did not have any further access.  I used an 8 French sheath from the right groin to recannulate the 8 mm portal, and a wire was run into the left arm.  I was worried about some pinching at the level of the portal, therefore used an 11 x 59 cm VBX stent.  Angiography of the left upper extremity followed demonstrating what appeared to be dissection and intimal bunching.  I cut down on the left subclavian artery and performed embolectomy with no thrombus, but intima removed.  Follow-up angiography demonstrated the need for stenting.  I used a 6 x 60, 6 x 40 [employer] Eluvia stents in the axillary and brachial arteries to restore flow in the left upper extremity.  TECHNIQUE:   Patient was brought to the OR laid in the supine position.  General anesthesia was induced and the patient's prepped draped in standard fashion.  The case began with ultrasound-guided micropuncture access of bilateral common femoral arteries.  The patient already had left radial access from the arterial line, therefore I used a micropuncture wire to place a 5 French slender sheath in the left radial artery.  In bilateral common femoral arteries.  A 5 French sheath was placed on the left, and an 8 French sheath was placed in the right.  I then moved to place 2 Pro-glide's in the right common femoral artery in preclose fashion.  Next, the 8 French sheath was replaced, and I snared the wire from the left radial artery to the right groin.  This was uneventful.  The patient was heparinized.  I had some difficulty getting a wire up through the left arm, however took my time, to ensure I did not have any type of dissection.  The wire sailed with ease.  The wire was then snared in the visceral aorta and pulled through and through with access from the left radial artery through the right common femoral artery.  Next, the 22 French sheath was brought onto the field and delivered  into the abdominal aorta.  A double curved Lunderquist wire was then positioned at zone 0.  The Lunderquist wire was then fed into the Gore 37 x 37 x 15 thoracic branch endoprosthesis device with the through and through left subclavian wire through the portal.  The graft was then positioned in the thoracic aorta, and deployed.  I was happy with the deployment.  Next we moved to bring the stent up through the portal.  This proved challenging, but we were able to finally get the nose cone through the portal and deployed it.  This was 12mm x 6 cm.  Follow-up angiography demonstrated excellent result.  I was concerned about a type Ib endoleak, therefore I elected to extend distally with a 37 x 37 x 10 TEVAR.  I was very happy with this result.  The first-order branches from the thoracic aorta filled nicely. I elected to close and tiedown the Pro-glide's in the right groin without issue.  The left groin was closed using a Mynx device.  I performed angiography of the left radial access which demonstrated widely patent brachial artery, radial artery, ulnar artery, therefore elected to use a TR band.  Upon removal, I listen to signals in the extremities.  All sounded good except for the left arm.  The left arm had a pulse difference of over 60 mmHg.  I was concerned there was kinking of the thoracic branch endoprosthesis.  The patient was reprepped, and I again performed ultrasound-guided micropuncture access of the left common femoral artery in retrograde fashion.  An 8 French sheath was placed and a wire run into the thoracic aorta.  We heparinized the patient and used a series of wires and catheters to cannulate the 8 mm sidebranch.  Once cannulated, I ran a wire into the left axillary artery.  The wire behaved abnormally, therefore I elected to pursue angiography of the left upper extremity.  This demonstrated dissection, and intimal bunching of the left axillary artery, brachial artery.  There appeared to be  thrombus in the artery as well.  I elected to cut down on the left brachial artery and perform a Fogarty embolectomy in retrograde fashion.  I used an Civil Engineer, Contracting as the Glidewire advantage was already down to the level of the brachial artery.  This yielded intima.  Very little thrombus.  Inflow was significantly improved, and therefore I elected to close the brachial artery using interrupted 6-0 Prolene sutures.  Again I performed angiography from the catheter that was left in the left axillary artery.  There was residual dissection, therefore I elected to stent using a 6 x 40 mm stent postdilated with a 5 mm balloon.  There is retrograde dissection which occurred, which necessitated 6 x 60 mm [employer] Scientific Innova stenting of the left axillary artery.  This was postdilated with 4 mm balloon.  There is excellent flow at this point down the arm with single-vessel ulnar outflow.  This was due to the TR band still being in place.  The TR band was then  removed and signals improved dramatically in the left radial artery.  At this point, access was lost in the left groin during attempted Pro-glide deployment and 45 minutes of manual pressure were held on the left common femoral artery with hemostasis achieved.  Protamine  was administered aspirin and Plavix were also administered.  The patient was taken to PACU in stable condition moving all extremities, able to follow commands, raising her knees off the bed, signals in all extremities.  [name], MD [facility] DATE OF DICTATION:   03/19/2024

## 2024-03-19 NOTE — Anesthesia Postprocedure Evaluation (Signed)
 Anesthesia Post Note  Patient: MAGALINE STEINBERG  Procedure(s) Performed: INSERTION, ENDOVASCULAR STENT GRAFT, AORTA, THORACIC; STENTING X2 OF LEFT SUBCLAVIAN ARTERY (Left: Arm Upper) ULTRASOUND GUIDANCE, FOR VASCULAR ACCESS OF BILATERAL FEMORAL AND LEFT RADIAL ARTERY (Bilateral: Groin) EXPOSURE OF BRACHIAL ARTERY WITH STENT INSERTION (Left: Arm Upper) LEFT AXILLARY ARTERY STENT INSERTION (Left: Arm Upper) EMBOLECTOMY OF LEFT BRACHIAL ARTERY (Left: Arm Upper)     Patient location during evaluation: PACU Anesthesia Type: General Level of consciousness: awake and alert Pain management: pain level controlled Vital Signs Assessment: post-procedure vital signs reviewed and stable Respiratory status: spontaneous breathing, nonlabored ventilation, respiratory function stable and patient connected to nasal cannula oxygen Cardiovascular status: blood pressure returned to baseline and stable Postop Assessment: no apparent nausea or vomiting Anesthetic complications: no   No notable events documented.  Last Vitals:  Vitals:   03/19/24 1740 03/19/24 1745  BP:  125/62  Pulse: 77 74  Resp: 16 16  Temp:    SpO2: 97% 96%    Last Pain:  Vitals:   03/19/24 1828  TempSrc:   PainSc: 9                  Thom JONELLE Peoples

## 2024-03-19 NOTE — OR Nursing (Signed)
 Dr. Lanis put 14cc into the patient's TR Band on her left wrist.

## 2024-03-19 NOTE — Anesthesia Procedure Notes (Signed)
 Arterial Line Insertion Start/End11/07/2023 11:00 AM, 03/19/2024 11:10 AM Performed by: Erma Thom SAUNDERS, MD, anesthesiologist  Patient location: Pre-op . Preanesthetic checklist: patient identified, IV checked, site marked, risks and benefits discussed, surgical consent, monitors and equipment checked, pre-op  evaluation, timeout performed and anesthesia consent Right, radial was placed Catheter size: 20 G Hand hygiene performed  and maximum sterile barriers used   Attempts: 2 Procedure performed using ultrasound to evaluate access site. Ultrasound Notes:relevant anatomy identified, ultrasound used to visualize needle entry and vessel patent under ultrasound. Following insertion, dressing applied and Biopatch. Post procedure assessment: normal and unchanged  Patient tolerated the procedure well with no immediate complications.

## 2024-03-20 ENCOUNTER — Encounter (HOSPITAL_COMMUNITY): Payer: Self-pay | Admitting: Vascular Surgery

## 2024-03-20 LAB — BASIC METABOLIC PANEL WITH GFR
Anion gap: 11 (ref 5–15)
BUN: 14 mg/dL (ref 8–23)
CO2: 22 mmol/L (ref 22–32)
Calcium: 7.7 mg/dL — ABNORMAL LOW (ref 8.9–10.3)
Chloride: 103 mmol/L (ref 98–111)
Creatinine, Ser: 0.67 mg/dL (ref 0.44–1.00)
GFR, Estimated: >60 mL/min (ref >60)
Glucose, Bld: 109 mg/dL — ABNORMAL HIGH (ref 70–99)
Potassium: 3.7 mmol/L (ref 3.5–5.1)
Sodium: 136 mmol/L (ref 135–145)

## 2024-03-20 LAB — CBC
HCT: 25.2 % — ABNORMAL LOW (ref 36.0–46.0)
HCT: 29.7 % — ABNORMAL LOW (ref 36.0–46.0)
Hemoglobin: 8.6 g/dL — ABNORMAL LOW (ref 12.0–15.0)
Hemoglobin: 9.6 g/dL — ABNORMAL LOW (ref 12.0–15.0)
MCH: 30.3 pg (ref 26.0–34.0)
MCH: 30.4 pg (ref 26.0–34.0)
MCHC: 34.1 g/dL (ref 30.0–36.0)
MCHC: 32.3 g/dL (ref 30.0–36.0)
MCV: 88.7 fL (ref 80.0–100.0)
MCV: 94 fL (ref 80.0–100.0)
Platelets: 156 K/uL (ref 150–400)
Platelets: 132 K/uL — ABNORMAL LOW (ref 150–400)
RBC: 2.84 MIL/uL — ABNORMAL LOW (ref 3.87–5.11)
RBC: 3.16 MIL/uL — ABNORMAL LOW (ref 3.87–5.11)
RDW: 14.6 % (ref 11.5–15.5)
RDW: 15.1 % (ref 11.5–15.5)
WBC: 12.2 K/uL — ABNORMAL HIGH (ref 4.0–10.5)
WBC: 12.7 K/uL — ABNORMAL HIGH (ref 4.0–10.5)
nRBC: 0 % (ref 0.0–0.2)
nRBC: 0 % (ref 0.0–0.2)

## 2024-03-20 LAB — POCT ACTIVATED CLOTTING TIME
Activated Clotting Time: 256 s
Activated Clotting Time: 239 s
Activated Clotting Time: 170 s
Activated Clotting Time: 210 s
Activated Clotting Time: 279 s
Activated Clotting Time: 239 s
Activated Clotting Time: 268 s
Activated Clotting Time: 285 s

## 2024-03-20 LAB — GLUCOSE, CAPILLARY: Glucose-Capillary: 117 mg/dL — ABNORMAL HIGH (ref 70–99)

## 2024-03-20 MED ORDER — SODIUM CHLORIDE 0.9 % IV BOLUS
500.0000 mL | Freq: Once | INTRAVENOUS | Status: AC
  Administered 2024-03-20: 500 mL via INTRAVENOUS

## 2024-03-20 MED ORDER — METOPROLOL SUCCINATE ER 25 MG PO TB24
12.5000 mg | ORAL_TABLET | Freq: Every day | ORAL | Status: DC
  Administered 2024-03-22 – 2024-03-23 (×2): 12.5 mg via ORAL
  Filled 2024-03-20 (×2): qty 1

## 2024-03-20 MED ORDER — DABIGATRAN ETEXILATE MESYLATE 150 MG PO CAPS
150.0000 mg | ORAL_CAPSULE | Freq: Two times a day (BID) | ORAL | Status: DC
  Administered 2024-03-20 – 2024-03-23 (×6): 150 mg via ORAL
  Filled 2024-03-20 (×8): qty 1

## 2024-03-20 MED ORDER — CHLORHEXIDINE GLUCONATE CLOTH 2 % EX PADS
6.0000 | MEDICATED_PAD | Freq: Every day | CUTANEOUS | Status: DC
  Administered 2024-03-20 – 2024-03-21 (×2): 6 via TOPICAL

## 2024-03-20 MED ORDER — CLOPIDOGREL BISULFATE 75 MG PO TABS
75.0000 mg | ORAL_TABLET | Freq: Every day | ORAL | Status: DC
  Administered 2024-03-20: 75 mg via ORAL
  Filled 2024-03-20: qty 1

## 2024-03-20 NOTE — Progress Notes (Signed)
 Called and left voice mail for Dr. Lanis to make him aware that patient c/o numbness and tingling in her left hand. MD came to bedside to assess patient. Dopplered pulses radial and ulnar and MD dopplered other pulses with good blood flow to left hand. MD gave order to hold metoprolol  today.

## 2024-03-20 NOTE — Progress Notes (Signed)
 During evening walk patient became imbalanced and fuzzy headed per patient. Patients vitals stable throughout. CBG checked and was 117. Assisted pt back to bed and NIH performed by this RN. NIH score 0. Rapid Response RN contacted to perform additional assessment and also got an NIH score of 0. Dr. Lanis called by this RN and stated to keep patient in bed tonight, no further orders were given at this time. Patient undergoing continuous care and monitoring.

## 2024-03-20 NOTE — Progress Notes (Signed)
 Fingertip numbness in the left. No pain.  Excellent signals in the radial and ulnar arteries, palmer arch signal.  No concern for ischemia. Will continue to monitor.   No blood pressure checks in the left arm. Pt with axillary and brachial artery stents. CBC this afternoon.  Continue ASA/Plavix   Terri Alexander E Terri Santalucia MD

## 2024-03-20 NOTE — Progress Notes (Addendum)
 Progress Note    03/20/2024 8:19 AM 1 Day Post-Op  Subjective:  no events overnight   Vitals:   03/20/24 0605 03/20/24 0700  BP: (!) 104/55 108/87  Pulse: 97 (!) 106  Resp: (!) 21 (!) 22  Temp:  97.8 F (36.6 C)  SpO2: 97% 97%   Physical Exam: Cardiac: tachycardic Lungs:  non labored on RA Incisions:  groin incisions c/d/I; L arm incision with bruising but no hematoma Extremities:  palpable L ulnar; brisk palmar arch by doppler; palpable DP pulses; moving all ext well Neurologic: A&O  CBC    Component Value Date/Time   WBC 12.2 (H) 03/20/2024 0458   RBC 2.84 (L) 03/20/2024 0458   HGB 8.6 (L) 03/20/2024 0458   HGB 13.6 11/09/2023 0842   HCT 25.2 (L) 03/20/2024 0458   HCT 42.7 11/09/2023 0842   PLT 156 03/20/2024 0458   PLT 335 11/09/2023 0842   MCV 88.7 03/20/2024 0458   MCV 94 11/09/2023 0842   MCH 30.3 03/20/2024 0458   MCHC 34.1 03/20/2024 0458   RDW 14.6 03/20/2024 0458   RDW 12.6 11/09/2023 0842   LYMPHSABS 2.3 09/15/2023 0223   MONOABS 0.9 09/15/2023 0223   EOSABS 92 10/03/2023 0956   BASOSABS 39 10/03/2023 0956    BMET    Component Value Date/Time   NA 136 03/20/2024 0458   NA 139 11/09/2023 0841   K 3.7 03/20/2024 0458   CL 103 03/20/2024 0458   CO2 22 03/20/2024 0458   GLUCOSE 109 (H) 03/20/2024 0458   BUN 14 03/20/2024 0458   BUN 21 11/09/2023 0841   CREATININE 0.67 03/20/2024 0458   CREATININE 1.13 (H) 10/03/2023 0956   CALCIUM 7.7 (L) 03/20/2024 0458   GFRNONAA >60 03/20/2024 0458   GFRNONAA 88 09/18/2019 1039   GFRAA 102 09/18/2019 1039    INR    Component Value Date/Time   INR 1.3 (H) 03/19/2024 1700     Intake/Output Summary (Last 24 hours) at 03/20/2024 0819 Last data filed at 03/20/2024 0700 Gross per 24 hour  Intake 3600.95 ml  Output 1380 ml  Net 2220.95 ml     Assessment/Plan:  73 y.o. female is s/p TEVAR with subclavian branch endoprosthesis 1 Day Post-Op   Overall, Ms. Karnes is recovering well from surgery.   BLE well perfused with palpable pulses.  L hand is well perfused with a palpable ulnar pulse and symmetric grip strength.  She is moving all extremities well and her neuro exam is at her baseline.  All incisions are well appearing without hematoma.  She is mildly tachycardic.  500cc saline bolus ordered.  Recheck CBC this afternoon.  Ok to get OOB and walk this morning.  Continue to hold Pradaxa .  Keep in ICU today.  Possible discharge home tomorrow.   Donnice Sender, PA-C [facility] [phone] 03/20/2024 8:19 AM  VASCULAR STAFF ADDENDUM: I have independently interviewed and examined the patient. I agree with the above.  Overall, she is doing well.  I saw her when she was sitting up in the chair.  Excellent signals in the feet and hands bilaterally.  Edema bilaterally. Slightly tachycardic.  Will get some fluid this morning. Trending hematocrit.   I think her back pain is due to laying flat.  She states she cannot lay flat even at home.  Will continue to follow this.  No plans for further intervention at this time Will continue aspirin, Plavix.  Will hold Pradaxa  until hematocrit stabilizes.  [name] MD [facility] Phone Number: [phone] 03/20/2024 9:42 AM

## 2024-03-21 ENCOUNTER — Inpatient Hospital Stay (HOSPITAL_COMMUNITY)

## 2024-03-21 DIAGNOSIS — I6389 Other cerebral infarction: Secondary | ICD-10-CM | POA: Diagnosis not present

## 2024-03-21 DIAGNOSIS — I639 Cerebral infarction, unspecified: Secondary | ICD-10-CM | POA: Diagnosis not present

## 2024-03-21 LAB — CBC
HCT: 27.4 % — ABNORMAL LOW (ref 36.0–46.0)
Hemoglobin: 9.1 g/dL — ABNORMAL LOW (ref 12.0–15.0)
MCH: 30.2 pg (ref 26.0–34.0)
MCHC: 33.2 g/dL (ref 30.0–36.0)
MCV: 91 fL (ref 80.0–100.0)
Platelets: 150 K/uL (ref 150–400)
RBC: 3.01 MIL/uL — ABNORMAL LOW (ref 3.87–5.11)
RDW: 15.2 % (ref 11.5–15.5)
WBC: 11.2 K/uL — ABNORMAL HIGH (ref 4.0–10.5)
nRBC: 0 % (ref 0.0–0.2)

## 2024-03-21 LAB — GLUCOSE, CAPILLARY
Glucose-Capillary: 136 mg/dL — ABNORMAL HIGH (ref 70–99)
Glucose-Capillary: 165 mg/dL — ABNORMAL HIGH (ref 70–99)

## 2024-03-21 LAB — BASIC METABOLIC PANEL WITH GFR
Anion gap: 9 (ref 5–15)
BUN: 10 mg/dL (ref 8–23)
CO2: 23 mmol/L (ref 22–32)
Calcium: 8 mg/dL — ABNORMAL LOW (ref 8.9–10.3)
Chloride: 105 mmol/L (ref 98–111)
Creatinine, Ser: 0.66 mg/dL (ref 0.44–1.00)
GFR, Estimated: >60 mL/min (ref >60)
Glucose, Bld: 106 mg/dL — ABNORMAL HIGH (ref 70–99)
Potassium: 3.7 mmol/L (ref 3.5–5.1)
Sodium: 137 mmol/L (ref 135–145)

## 2024-03-21 MED ORDER — SODIUM CHLORIDE 0.9 % IV BOLUS
500.0000 mL | Freq: Once | INTRAVENOUS | Status: AC
  Administered 2024-03-21: 500 mL via INTRAVENOUS

## 2024-03-21 MED ORDER — POTASSIUM CHLORIDE CRYS ER 20 MEQ PO TBCR
40.0000 meq | EXTENDED_RELEASE_TABLET | Freq: Once | ORAL | Status: AC
  Administered 2024-03-21: 40 meq via ORAL
  Filled 2024-03-21: qty 2

## 2024-03-21 NOTE — Significant Event (Addendum)
 Rapid Response Event Note   Reason for Call :  Family concerned for patient having delayed responses and staring off. RN noted patient staggering with ambulation and forgetful, impulsive behavior.   RRT was called yesterday around 1830 for patient having sudden onset of feeling fuzzy headed and having difficulty with ambulation. NIH 0.   Per report, it does not sound like patient symptoms resolved from yesterday, LKW 1830 03/20/2024.  Initial Focused Assessment:  Patient awake, sitting in chair. NIH 0. Patient slow to respond, but does respond appropriately. Skin warm, dry, pink. No distress. Patient received 10mg  PO Oxy at 0515.  VS: BP 100/64, HR 110, RR 23, SpO2 95% CBG: 136  Interventions:    Plan of Care:  -Encourage appropriate sleep-wake cycle -Avoid sedating medications  Event Summary:  MD Notified: M. Eveland, PA Call Time: 1015 Arrival Time: 1020 End Time: 1045  Terri LITTIE Danker, RN

## 2024-03-21 NOTE — Progress Notes (Signed)
 Patient seen and examined at bedside due to concern for continued unsteadiness when standing.  Patient did not have this unsteadiness yesterday morning, it was appreciated yesterday afternoon.  NIH stroke score was 0.  Has a history of vertigo.  I had her stand with me.  She had a sway, able to stand on her own but there was much less sway with holding my hand.  Denied weakness, denied sensory deficits.  I had a long conversation with Terri Alexander and her family.  The TEVAR with branch endoprosthesis carries a risk of stroke, however I would expect symptoms to be either unilateral, or they present immediately after surgery.  The delayed onset that only involves unsteadiness with standing seems to be less similar to stroke, and more similar to her prior diagnosis of vertigo.  I have ordered a CT angio of the graft to ensure that it is in correct position and did not slip. While symptoms are consistent with vertigo, I am concerned about stroke due to the acute onset. I have ordered an MRI.  She is currently on best medical therapy  [name] MD

## 2024-03-21 NOTE — Progress Notes (Signed)
 Patient ID: Terri Alexander, female   DOB: 1950-09-04, 73 y.o.   MRN: [id number]  Evaluation after Contrast Extravasation  Patient seen and examined immediately after contrast extravasation while in CT.  Exam: There is mild swelling at the right upper arm area.  There is no erythema. There is no discoloration. There are no blisters. There are no signs of decreased perfusion of the skin.  It is warm to touch.  The patient has full ROM in fingers.  Radial pulse palpated normal.  Per contrast extravasation protocol, I have instructed the patient to keep an ice pack on the area for 20-60 minutes at a time for about 48 hours.   Keep arm elevated as much as possible.   The patient understands to call the radiology department if there is: - increase in pain or swelling - changed or altered sensation - ulceration or blistering - increasing redness - warmth or increasing firmness - decreased tissue perfusion as noted by decreased capillary refill or discoloration of skin - decreased pulses peripheral to site   [employer] PA-C 03/21/2024 1:03 PM

## 2024-03-21 NOTE — Progress Notes (Signed)
 This nurse travelled to CT with the patieint for stat CTA of the chest patient was placed on table and IV in R Ac was flushed per policy. This nurse walked into control room of the Ct area and waited for Ct tech to complete study upon activation of power injector it was observed that Ct contrast extravasated from the iv catheter into tissue PA from radiology was notified of extravasation and patient was given ice packs and brought back to the floor Vascular Surgery notified of loss of iv access. Md ordered Carotic US  study instead of CTA. MR brain scheduled for this evening WO contrast. Patient is in lowest position with call light in reach with current plan of care in progress

## 2024-03-21 NOTE — Progress Notes (Signed)
 Progress Note    03/21/2024 8:50 AM 2 Days Post-Op  Subjective: Yesterday had an episode of dizziness, not feeling well.  Rapid called.  NIH negative.  Patient improved shortly thereafter.   Vitals:   03/21/24 0700 03/21/24 0801  BP: (!) 117/55   Pulse: (!) 114   Resp: 20   Temp:  98.8 F (37.1 C)  SpO2: 91%    Physical Exam: Cardiac: tachycardic Lungs:  non labored on RA Incisions:  groin incisions c/d/I; L arm incision with bruising but no hematoma Extremities:  palpable L ulnar; brisk palmar arch by doppler; palpable DP pulses; moving all ext well Neurologic: A&O  CBC    Component Value Date/Time   WBC 11.2 (H) 03/21/2024 0329   RBC 3.01 (L) 03/21/2024 0329   HGB 9.1 (L) 03/21/2024 0329   HGB 13.6 11/09/2023 0842   HCT 27.4 (L) 03/21/2024 0329   HCT 42.7 11/09/2023 0842   PLT 150 03/21/2024 0329   PLT 335 11/09/2023 0842   MCV 91.0 03/21/2024 0329   MCV 94 11/09/2023 0842   MCH 30.2 03/21/2024 0329   MCHC 33.2 03/21/2024 0329   RDW 15.2 03/21/2024 0329   RDW 12.6 11/09/2023 0842   LYMPHSABS 2.3 09/15/2023 0223   MONOABS 0.9 09/15/2023 0223   EOSABS 92 10/03/2023 0956   BASOSABS 39 10/03/2023 0956    BMET    Component Value Date/Time   NA 137 03/21/2024 0329   NA 139 11/09/2023 0841   K 3.7 03/21/2024 0329   CL 105 03/21/2024 0329   CO2 23 03/21/2024 0329   GLUCOSE 106 (H) 03/21/2024 0329   BUN 10 03/21/2024 0329   BUN 21 11/09/2023 0841   CREATININE 0.66 03/21/2024 0329   CREATININE 1.13 (H) 10/03/2023 0956   CALCIUM 8.0 (L) 03/21/2024 0329   GFRNONAA >60 03/21/2024 0329   GFRNONAA 88 09/18/2019 1039   GFRAA 102 09/18/2019 1039    INR    Component Value Date/Time   INR 1.3 (H) 03/19/2024 1700     Intake/Output Summary (Last 24 hours) at 03/21/2024 0850 Last data filed at 03/21/2024 0600 Gross per 24 hour  Intake 561.71 ml  Output 650 ml  Net -88.29 ml     Assessment/Plan:  73 y.o. female is s/p TEVAR with subclavian branch  endoprosthesis 2 Days Post-Op   Overall, I am happy with how she is doing.  Her blood pressure continues to be a little low and her heart rate continues to be elevated.  We will start her metoprolol  back today. I would like to for her to get up and walk around with physical therapy. She can be moved to the floor - 4E.  All home meds have been continued. She will continue on aspirin, Pradaxa  Likely home tomorrow pending physical therapy eval and continued improvement. Patient can be up out of bed as tolerated with supervision.  I had a long conversation with her and her family regarding her thoracic range and a prosthesis.  I am happy with aortic coverage, but am unhappy with the fact that we need to distend the axillary and brachial arteries to reestablish flow to the left upper extremity.  I think that these will be a problem in the future, and she may require subsequent extra-anatomic bypass.  For the time being, she has a palpable pulse at the wrist.  I think that the radial artery is occluded, but she has an intact palmar arch with an excellent triphasic signal.  No plans for  further revascularization at this time.   [name] MD [facility] Phone Number: [phone] 03/21/2024 8:50 AM

## 2024-03-21 NOTE — Progress Notes (Signed)
 Pt needs a peripheral IV for safety purposes. Can be left arm in the forearm.   [name] MD

## 2024-03-22 ENCOUNTER — Inpatient Hospital Stay (HOSPITAL_COMMUNITY)

## 2024-03-22 DIAGNOSIS — I6389 Other cerebral infarction: Secondary | ICD-10-CM

## 2024-03-22 DIAGNOSIS — I4891 Unspecified atrial fibrillation: Secondary | ICD-10-CM

## 2024-03-22 DIAGNOSIS — Z9889 Other specified postprocedural states: Secondary | ICD-10-CM

## 2024-03-22 DIAGNOSIS — I639 Cerebral infarction, unspecified: Secondary | ICD-10-CM | POA: Diagnosis not present

## 2024-03-22 DIAGNOSIS — I6340 Cerebral infarction due to embolism of unspecified cerebral artery: Secondary | ICD-10-CM

## 2024-03-22 LAB — LIPID PANEL
Cholesterol: 141 mg/dL (ref 0–200)
HDL: 48 mg/dL (ref >40)
LDL Cholesterol: 71 mg/dL (ref 0–99)
Total CHOL/HDL Ratio: 2.9 ratio
Triglycerides: 110 mg/dL (ref <150)
VLDL: 22 mg/dL (ref 0–40)

## 2024-03-22 LAB — ECHOCARDIOGRAM COMPLETE
Area-P 1/2: 4.06 cm2
Height: 62 in
S' Lateral: 3.1 cm
Weight: 2239.87 [oz_av]

## 2024-03-22 LAB — HEMOGLOBIN A1C
Hgb A1c MFr Bld: 5.1 % (ref 4.8–5.6)
Mean Plasma Glucose: 99.67 mg/dL

## 2024-03-22 LAB — CBC
HCT: 25.1 % — ABNORMAL LOW (ref 36.0–46.0)
Hemoglobin: 8.5 g/dL — ABNORMAL LOW (ref 12.0–15.0)
MCH: 30.6 pg (ref 26.0–34.0)
MCHC: 33.9 g/dL (ref 30.0–36.0)
MCV: 90.3 fL (ref 80.0–100.0)
Platelets: 174 K/uL (ref 150–400)
RBC: 2.78 MIL/uL — ABNORMAL LOW (ref 3.87–5.11)
RDW: 15.1 % (ref 11.5–15.5)
WBC: 13.4 K/uL — ABNORMAL HIGH (ref 4.0–10.5)
nRBC: 0 % (ref 0.0–0.2)

## 2024-03-22 LAB — BASIC METABOLIC PANEL WITH GFR
Anion gap: 9 (ref 5–15)
BUN: 7 mg/dL — ABNORMAL LOW (ref 8–23)
CO2: 21 mmol/L — ABNORMAL LOW (ref 22–32)
Calcium: 7.9 mg/dL — ABNORMAL LOW (ref 8.9–10.3)
Chloride: 106 mmol/L (ref 98–111)
Creatinine, Ser: 0.67 mg/dL (ref 0.44–1.00)
GFR, Estimated: >60 mL/min (ref >60)
Glucose, Bld: 168 mg/dL — ABNORMAL HIGH (ref 70–99)
Potassium: 3.8 mmol/L (ref 3.5–5.1)
Sodium: 136 mmol/L (ref 135–145)

## 2024-03-22 MED ORDER — METOPROLOL TARTRATE 5 MG/5ML IV SOLN: 5.0000 mg | INTRAVENOUS | Status: DC | PRN

## 2024-03-22 NOTE — Evaluation (Signed)
 Physical Therapy Evaluation Patient Details Name: Terri Alexander MRN: [id number] DOB: 02-07-1951 Today's Date: 03/22/2024  History of Present Illness  73 y.o. female admitted 11/3 due to  history of descending thoracic aortic aneurysm extending from zone 3, to zone 4.  Pt is s/p TEVAR with subclavian branch endoprosthesis performed 11/3. 2 days post op pt with episode and rapid response called with suspected CVA.   MRI  demonstrated an embolic event with small, punctate infarcts involving bilateral hemispheres. PMH: afib with ablation, cardioversion  Clinical Impression  Pt admitted with above diagnosis. Pt was able to ambulate with assist but requiring mod assist and cues for safety due to poor balance reactions, poor coordination of left LE and decr safety awareness. Requiring mod assist overall today. Pt was I PTA and has supportive family. Recommend post acute rehab > 3 hours day.   Pt currently with functional limitations due to the deficits listed below (see PT Problem List). Pt will benefit from acute skilled PT to increase their independence and safety with mobility to allow discharge.           If plan is discharge home, recommend the following: A little help with walking and/or transfers;A little help with bathing/dressing/bathroom;Assistance with cooking/housework;Assist for transportation;Help with stairs or ramp for entrance   Can travel by private vehicle        Equipment Recommendations Other (comment) (TBA)  Recommendations for Other Services  Rehab consult    Functional Status Assessment Patient has had a recent decline in their functional status and demonstrates the ability to make significant improvements in function in a reasonable and predictable amount of time.     Precautions / Restrictions Precautions Precautions: Fall Recall of Precautions/Restrictions: Intact Restrictions Weight Bearing Restrictions Per Provider Order: No      Mobility  Bed Mobility Overal  bed mobility: Independent                  Transfers Overall transfer level: Needs assistance Equipment used: Rolling walker (2 wheels), None Transfers: Sit to/from Stand Sit to Stand: Mod assist           General transfer comment: Pt requiring assist due to poor awareness of posture upon standing with pt losing balance posteriorly. Poor coordination of trunk and left LE.    Ambulation/Gait Ambulation/Gait assistance: Mod assist Gait Distance (Feet): 65 Feet Assistive device: None, Rolling walker (2 wheels) Gait Pattern/deviations: Decreased step length - left, Decreased dorsiflexion - left, Decreased stance time - left, Knee flexed in stance - left, Leaning posteriorly, Staggering right, Staggering left, Trunk flexed, Ataxic   Gait velocity interpretation: <1.31 ft/sec, indicative of household ambulator   General Gait Details: Pt needs mod assist to ambulate without device with LOB all directions due to poor coordination of trunk and left LE.  Does better with RW although still sone unsteadiness due to left LE decr dorsiflexion which worsens as she walks further.  Stairs            Wheelchair Mobility     Tilt Bed    Modified Rankin (Stroke Patients Only)       Balance Overall balance assessment: Needs assistance Sitting-balance support: No upper extremity supported, Feet supported Sitting balance-Leahy Scale: Good     Standing balance support: Bilateral upper extremity supported, No upper extremity supported, Single extremity supported, During functional activity Standing balance-Leahy Scale: Poor  Pertinent Vitals/Pain Pain Assessment Pain Assessment: Faces Faces Pain Scale: Hurts even more Pain Location: neck and shoulders (chronic) Pain Descriptors / Indicators: Aching, Discomfort Pain Intervention(s): Limited activity within patient's tolerance, Monitored during session, Repositioned, Heat applied     Home Living Family/patient expects to be discharged to:: Private residence Living Arrangements: Spouse/significant other Available Help at Discharge: Family;Available 24 hours/day Type of Home: House Home Access: Stairs to enter   [employer] of Steps: 1   Home Layout: One level Home Equipment: Shower seat - built in      Prior Function Prior Level of Function : Driving;Independent/Modified Independent             Mobility Comments: I PTA no device ADLs Comments: took tub baths per pt, I B/D PTA     Extremity/Trunk Assessment   Upper Extremity Assessment Upper Extremity Assessment: Defer to OT evaluation (reports numbness left UE yesterday but has improved)    Lower Extremity Assessment Lower Extremity Assessment: LLE deficits/detail LLE Deficits / Details: grossly 4-/5 LLE Coordination: decreased gross motor;decreased fine motor    Cervical / Trunk Assessment Cervical / Trunk Assessment: Normal  Communication   Communication Communication: No apparent difficulties    Cognition Arousal: Alert Behavior During Therapy: WFL for tasks assessed/performed   PT - Cognitive impairments: Safety/Judgement, Memory                         Following commands: Intact       Cueing Cueing Techniques: Verbal cues, Tactile cues     General Comments General comments (skin integrity, edema, etc.): VSS    Exercises General Exercises - Lower Extremity Long Arc Quad: AROM, Both, 10 reps, Seated   Assessment/Plan    PT Assessment Patient needs continued PT services  PT Problem List Decreased activity tolerance;Decreased balance;Decreased mobility;Decreased strength;Decreased coordination;Decreased knowledge of use of DME;Decreased safety awareness;Decreased knowledge of precautions       PT Treatment Interventions DME instruction;Gait training;Functional mobility training;Therapeutic activities;Therapeutic exercise;Balance training;Patient/family  education;Cognitive remediation;Stair training    PT Goals (Current goals can be found in the Care Plan section)  Acute Rehab PT Goals Patient Stated Goal: to go home PT Goal Formulation: With patient Time For Goal Achievement: 04/05/24 Potential to Achieve Goals: Good    Frequency Min 3X/week     Co-evaluation               AM-PAC PT 6 Clicks Mobility  Outcome Measure Help needed turning from your back to your side while in a flat bed without using bedrails?: A Little Help needed moving from lying on your back to sitting on the side of a flat bed without using bedrails?: A Little Help needed moving to and from a bed to a chair (including a wheelchair)?: A Lot Help needed standing up from a chair using your arms (e.g., wheelchair or bedside chair)?: A Lot Help needed to walk in hospital room?: A Lot Help needed climbing 3-5 steps with a railing? : A Lot 6 Click Score: 14    End of Session Equipment Utilized During Treatment: Gait belt Activity Tolerance: Patient limited by fatigue Patient left: in chair;with call bell/phone within reach;with chair alarm set;with family/visitor present Nurse Communication: Mobility status PT Visit Diagnosis: Unsteadiness on feet (R26.81);Muscle weakness (generalized) (M62.81)    Time: 8861-8793 PT Time Calculation (min) (ACUTE ONLY): 28 min   Charges:   PT Evaluation $PT Eval Moderate Complexity: 1 Mod PT Treatments $Gait Training: 8-22 mins  PT General Charges $$ ACUTE PT VISIT: 1 Visit         [facility] M,PT Acute Rehab Services [phone]   Stephane JULIANNA Bevel 03/22/2024, 2:32 PM

## 2024-03-22 NOTE — Progress Notes (Signed)
 Inpatient Rehab Admissions Coordinator:   Consult received and chart reviewed.  Note POD 3 TEVAR and delayed post operative CVA in bilateral hemispheres.  Therapy evaluations pending.  Will see how she does with therapy to gauge rehab potential and goals.  Will follow.   Reche Lowers, PT, DPT Admissions Coordinator [phone] 03/22/24 1:08 PM

## 2024-03-22 NOTE — Consult Note (Signed)
 NEUROLOGY CONSULT NOTE   Date of service: March 22, 2024 Patient Name: Terri Alexander MRN:  [id number] DOB:  [birth year] Chief Complaint: Stroke on MRI Requesting Provider: [name], MD  History of Present Illness  Terri Alexander is a 73 y.o. female with hx of atrial fibrillation on Pradaxa , HLD, PVD, and cataracts who has been seen by [facility] for known type B aortic dissection with aneurysmal degeneration of the descending aorta and presented for a scheduled TEVAR, coverage of the left subclavian artery, thoracic branch endoprosthesis. Case was complicated by dissection of the left subclavian artery with need for stenting. She had an episode of transient aphasia on 11/4 that only lasted a couple of minutes. She had another episode on 11/5 and unsteadiness while walking. RR was called both times and both times her NIH was a 0 on assessment. An MRI brain was ordered after the recurrent episodes and small acute infarcts in the bilateral frontal lobes, left occipital lobe, and left cerebellum we noted. Neurology team consulted for stroke work up.    ROS  Comprehensive ROS performed and pertinent positives documented in HPI   Past History   Past Medical History:  Diagnosis Date   Adenomatous colon polyp    Allergy    Anemia    as a child, no current problem   Arrhythmia    Atrial fibrillation (HCC)    Blood transfusion without reported diagnosis @ 9 months   9 months old   Cataract    bilateral - MD just watching    Dysrhythmia    A. Fib   GERD (gastroesophageal reflux disease)    History of hiatal hernia    Hyperlipidemia    Osteoporosis 04/15/2016   Peripheral vascular disease    Type B Aortic Dissection   Skin cancer 2013   Skin cancer- Nose, face    Past Surgical History:  Procedure Laterality Date   ANGIOPLASTY, ARTERY, SUBCLAVIAN, ENDOVASCULAR, WITH STENT INSERTION Left 03/19/2024   Procedure: LEFT AXILLARY ARTERY STENT INSERTION;  Surgeon: [name], MD;   Location: [facility] OR;  Service: Vascular;  Laterality: Left;   ATRIAL FIBRILLATION ABLATION N/A 02/16/2023   Procedure: ATRIAL FIBRILLATION ABLATION;  Surgeon: [name], MD;  Location: [facility] INVASIVE CV LAB;  Service: Cardiovascular;  Laterality: N/A;   CARDIOVERSION N/A 09/09/2022   Procedure: CARDIOVERSION;  Surgeon: [name], MD;  Location: [facility] INVASIVE CV LAB;  Service: Cardiovascular;  Laterality: N/A;   CESAREAN SECTION  1981   x 1   CESAREAN SECTION     COLONOSCOPY  2016   hx polyp, tics, sigmoid colon mod tortuous Dr [name],    EMBOLECTOMY Left 03/19/2024   Procedure: EMBOLECTOMY OF LEFT BRACHIAL ARTERY;  Surgeon: [name], MD;  Location: [facility] OR;  Service: Vascular;  Laterality: Left;   EYE SURGERY Bilateral    retina repair x 2   FEMORAL ARTERY EXPLORATION Left 03/19/2024   Procedure: EXPOSURE OF BRACHIAL ARTERY WITH STENT INSERTION;  Surgeon: [name], MD;  Location: [facility] OR;  Service: Vascular;  Laterality: Left;   LAPAROTOMY  1979   removal of ovarian cyst   nissan fundiplication  05/17/1997   REPAIR OF COMPLEX TRACTION RETINAL DETACHMENT Bilateral 01/2019   SVT ABLATION N/A 12/09/2023   Procedure: SVT ABLATION;  Surgeon: [name], MD;  Location: [facility] INVASIVE CV LAB;  Service: Cardiovascular;  Laterality: N/A;   THORACIC AORTIC ENDOVASCULAR STENT GRAFT Left 03/19/2024   Procedure: INSERTION, ENDOVASCULAR STENT  GRAFT, AORTA, THORACIC; STENTING X2 OF LEFT SUBCLAVIAN ARTERY;  Surgeon: [name], MD;  Location: [facility] OR;  Service: Vascular;  Laterality: Left;   ULTRASOUND GUIDANCE FOR VASCULAR ACCESS Bilateral 03/19/2024   Procedure: ULTRASOUND GUIDANCE, FOR VASCULAR ACCESS OF BILATERAL FEMORAL AND LEFT RADIAL ARTERY;  Surgeon: [name], MD;  Location: [facility] OR;  Service: Vascular;  Laterality: Bilateral;    Family History: Family History  Problem Relation Age of Onset   Arthritis Mother    Heart disease Mother    Vision loss Mother    Stroke  Mother    Arrhythmia Mother    Depression Sister    Transient ischemic attack Sister 73   Depression Sister    Cancer Maternal Grandmother    Cancer Maternal Grandfather    Early death Maternal Grandfather    Heart disease Maternal Grandfather    Early death Paternal Grandfather    Heart disease Paternal Grandfather    Diabetes Other        Juvenile   Arrhythmia Maternal Aunt    Colon cancer Neg Hx    Rectal cancer Neg Hx    Prostate cancer Neg Hx     Social History  reports that she has never smoked. She has never used smokeless tobacco. She reports current alcohol use of about 1.0 standard drink of alcohol per week. She reports that she does not use drugs.  No Known Allergies  Medications   Current Facility-Administered Medications:    acetaminophen  (TYLENOL ) tablet 325-650 mg, 325-650 mg, Oral, Q4H PRN, 650 mg at 03/22/24 0629 **OR** acetaminophen  (TYLENOL ) suppository 325-650 mg, 325-650 mg, Rectal, Q4H PRN, Eveland, Matthew, PA-C   aspirin EC tablet 81 mg, 81 mg, Oral, Q0600, Eveland, Matthew, PA-C, 81 mg at 03/22/24 9375   bisacodyl (DULCOLAX) EC tablet 5 mg, 5 mg, Oral, Daily PRN, Eveland, Matthew, PA-C   chlorhexidine  (PERIDEX ) 0.12 % solution 15 mL, 15 mL, Mouth/Throat, Once, Eveland, Matthew, PA-C   cyclobenzaprine  (FLEXERIL ) tablet 10 mg, 10 mg, Oral, TID PRN, Eveland, Matthew, PA-C, 10 mg at 03/22/24 9370   dabigatran  (PRADAXA ) capsule 150 mg, 150 mg, Oral, Q12H, Eveland, Matthew, PA-C, 150 mg at 03/22/24 0804   docusate sodium  (COLACE) capsule 100 mg, 100 mg, Oral, q AM, Eveland, Matthew, PA-C, 100 mg at 03/22/24 9375   famotidine  (PEPCID ) tablet 20 mg, 20 mg, Oral, Daily PRN, Eveland, Matthew, PA-C, 20 mg at 03/19/24 2226   metoprolol  succinate (TOPROL -XL) 24 hr tablet 12.5 mg, 12.5 mg, Oral, Daily, Eveland, Matthew, PA-C, 12.5 mg at 03/22/24 0804   metoprolol  tartrate (LOPRESSOR ) injection 2.5-5 mg, 2.5-5 mg, Intravenous, Q2H PRN, Eveland, Matthew, PA-C, 5 mg at  03/21/24 9140   Oral care mouth rinse, 15 mL, Mouth Rinse, PRN, Eveland, Matthew, PA-C   oxyCODONE (Oxy IR/ROXICODONE) immediate release tablet 5-10 mg, 5-10 mg, Oral, Q4H PRN, Eveland, Matthew, PA-C, 10 mg at 03/21/24 1901   pantoprazole (PROTONIX) EC tablet 40 mg, 40 mg, Oral, Daily, Eveland, Matthew, PA-C, 40 mg at 03/22/24 0804   pantoprazole (PROTONIX) EC tablet 40 mg, 40 mg, Oral, Daily PRN, Eveland, Matthew, PA-C   phenol (CHLORASEPTIC) mouth spray 1 spray, 1 spray, Mouth/Throat, PRN, Eveland, Matthew, PA-C   polyethylene glycol (MIRALAX  / GLYCOLAX ) packet 17 g, 17 g, Oral, Daily PRN, Eveland, Matthew, PA-C   potassium chloride  SA (KLOR-CON  M) CR tablet 40-60 mEq, 40-60 mEq, Oral, Daily PRN, Bethanie Cough, PA-C  Vitals   Vitals:   03/21/24 1945 03/21/24 2305 03/22/24 0414 03/22/24 9195  BP: (!) 123/52 (!) 114/51 (!) 118/47 (!) 118/47  Pulse: (!) 105 87  (!) 110  Resp: 20 19 20  (!) 21  Temp: 98.8 F (37.1 C) (!) 97.5 F (36.4 C) 98 F (36.7 C) 98.1 F (36.7 C)  TempSrc: Oral Oral Oral Oral  SpO2: 93% 94% 99% 95%  Weight:      Height:        Body mass index is 25.6 kg/m.   Physical Exam   Constitutional: Appears well-developed and well-nourished.  Psych: Affect appropriate to situation.  Eyes: No scleral injection.  HENT: No OP obstruction.  Head: Normocephalic.  Cardiovascular: Normal rate and regular rhythm.  Respiratory: Effort normal, non-labored breathing.  GI: Soft.  No distension. There is no tenderness.  Skin: WDI.   Neurologic Examination   Mental Status: Patient is awake, alert, oriented to person, place, month, year, and situation. Patient is able to give a clear and coherent history. No signs of aphasia or neglect Cranial Nerves: II: Visual Fields are full. Pupils are equal, round, and reactive to light.   III,IV, VI: EOMI without ptosis or diplopia.  V: Facial sensation is symmetric to temperature VII: Facial movement is symmetric resting and  smiling VIII: Hearing is intact to voice X: Palate elevates symmetrically XI: Shoulder shrug is symmetric. XII: Tongue protrudes midline without atrophy or fasciculations.  Motor: Tone is normal. Bulk is normal. No focal strength deficit noted. Patient states she feels weak in all extremities. No drift noted but 4/5 strength in bilateral uppers, some hesestancy due to recent procedures.  Sensory: Sensation is symmetric to light touch and temperature in the arms and legs. No extinction to DSS present.  Cerebellar: FNF and HKS are intact bilaterally   Labs/Imaging/Neurodiagnostic studies   CBC:  Recent Labs  Lab 04-04-2024 0329 03/22/24 1041  WBC 11.2* 13.4*  HGB 9.1* 8.5*  HCT 27.4* 25.1*  MCV 91.0 90.3  PLT 150 174   Basic Metabolic Panel:  Lab Results  Component Value Date   NA 136 03/22/2024   K 3.8 03/22/2024   CO2 21 (L) 03/22/2024   GLUCOSE 168 (H) 03/22/2024   BUN 7 (L) 03/22/2024   CREATININE 0.67 03/22/2024   CALCIUM 7.9 (L) 03/22/2024   GFRNONAA >60 03/22/2024   GFRAA 102 09/18/2019   Lipid Panel:  Lab Results  Component Value Date   LDLCALC 71 03/22/2024   HgbA1c:  Lab Results  Component Value Date   HGBA1C 5.1 03/22/2024   INR  Lab Results  Component Value Date   INR 1.3 (H) 03/19/2024   APTT  Lab Results  Component Value Date   APTT 34 03/19/2024   Echocardiogram LVEF 60 to 65%, left and right atrium normal in size, no shunt detected by color-flow Doppler  MR Angio head without contrast: Normal   Carotid ultrasound Apr 04, 2024): Right Carotid: The extracranial vessels were near-normal with only minimal wall thickening or plaque.  Left Carotid: The extracranial vessels were near-normal with only minimal wall thickening or plaque.  Vertebrals:  Bilateral vertebral arteries demonstrate antegrade flow.  Subclavians: Normal flow hemodynamics were seen in bilateral subclavian arteries.   MRI Brain (Personally reviewed): Small acute infarcts in the  bilateral frontal lobes, left occipital lobe, and left cerebellum. Given involvement of multiple vascular territories, consider embolic etiology.  ASSESSMENT   Terri Alexander is a 73 y.o. female with a PMHx of atrial fibrillation on Pradaxa , HLD, PVD, and cataracts who was initially follows by [facility] for known type B  aortic dissection with aneurysmal degeneration of the descending aorta and presented for a repair  She was off of her Pradaxa  for two days prior to her surgery. She had an episode of transient aphasia and unsteadiness. MRI ordered by primary team. She was found to have strokes on MRI. She is now on maximum medical therapy with aspirin and Pradaxa .  EF is normal at 60 to 65%, hemoglobin A1c 5.1, LDL 71. Family is hoping for CIR for rehab.   RECOMMENDATIONS  - Stroke work up complete - Recommend resuming simvastatin  as she has tolerated this well in the past and her LDL is just above goal - Continue pradaxa , ASA 81mg  per vascular surgery - May follow up with outpatient neuro at [facility]   ______________________________________________________________________   Signed, Jorene Last, NP [facility]  I have seen and examined the patient. I agree with the Neurology NP's assessment and recommendations. 73 year old female with perioperative acute strokes after vascular surgery procedure. Exam with mild diffuse weakness. Recommendations as above.  Electronically signed: Dr. Kathlyn Leachman

## 2024-03-22 NOTE — Progress Notes (Addendum)
 Mobility Specialist Progress Note:    03/22/24 1046  Mobility  Activity Pivoted/transferred from bed to chair;Ambulated with assistance  Level of Assistance Minimal assist, patient does 75% or more  Assistive Device Front wheel walker  Distance Ambulated (ft) 25 ft  Activity Response Tolerated well  Mobility Referral  --   Mobility visit 1 Mobility  Mobility Specialist Start Time (ACUTE ONLY) 1036  Mobility Specialist Stop Time (ACUTE ONLY) 1046  Mobility Specialist Time Calculation (min) (ACUTE ONLY) 10 min   Pt received in bed, agreeable to mobility session. Ambulated to door and agreeable to sit up in chair. Posterior lean, corrected with CGA. Tolerated well, asx throughout. Sitting up in chair with all needs met. Call bell in reach.   Shakeeta Godette Mobility Specialist Please contact via Special Educational Needs Teacher or  Rehab office at [phone]

## 2024-03-22 NOTE — Progress Notes (Signed)
  Echocardiogram 2D Echocardiogram has been performed.  Terri Alexander 03/22/2024, 11:49 AM

## 2024-03-22 NOTE — Progress Notes (Signed)
 Progress Note    03/22/2024 7:49 AM 3 Days Post-Op  Subjective: Sitting in the chair this morning.  No issues overnight.  Stated she felt better than she did yesterday.  Cold.   Vitals:   03/21/24 2305 03/22/24 0414  BP: (!) 114/51 (!) 118/47  Pulse: 87   Resp: 19 20  Temp: (!) 97.5 F (36.4 C) 98 F (36.7 C)  SpO2: 94% 99%   Physical Exam: Cardiac: tachycardic Lungs:  non labored on RA Incisions:  groin incisions c/d/I; L arm incision with bruising but no hematoma Extremities:  palpable L ulnar; brisk palmar arch by doppler; palpable DP pulses; moving all ext well Neurologic: A&O  CBC    Component Value Date/Time   WBC 11.2 (H) 03/21/2024 0329   RBC 3.01 (L) 03/21/2024 0329   HGB 9.1 (L) 03/21/2024 0329   HGB 13.6 11/09/2023 0842   HCT 27.4 (L) 03/21/2024 0329   HCT 42.7 11/09/2023 0842   PLT 150 03/21/2024 0329   PLT 335 11/09/2023 0842   MCV 91.0 03/21/2024 0329   MCV 94 11/09/2023 0842   MCH 30.2 03/21/2024 0329   MCHC 33.2 03/21/2024 0329   RDW 15.2 03/21/2024 0329   RDW 12.6 11/09/2023 0842   LYMPHSABS 2.3 09/15/2023 0223   MONOABS 0.9 09/15/2023 0223   EOSABS 92 10/03/2023 0956   BASOSABS 39 10/03/2023 0956    BMET    Component Value Date/Time   NA 137 03/21/2024 0329   NA 139 11/09/2023 0841   K 3.7 03/21/2024 0329   CL 105 03/21/2024 0329   CO2 23 03/21/2024 0329   GLUCOSE 106 (H) 03/21/2024 0329   BUN 10 03/21/2024 0329   BUN 21 11/09/2023 0841   CREATININE 0.66 03/21/2024 0329   CREATININE 1.13 (H) 10/03/2023 0956   CALCIUM 8.0 (L) 03/21/2024 0329   GFRNONAA >60 03/21/2024 0329   GFRNONAA 88 09/18/2019 1039   GFRAA 102 09/18/2019 1039    INR    Component Value Date/Time   INR 1.3 (H) 03/19/2024 1700     Intake/Output Summary (Last 24 hours) at 03/22/2024 0749 Last data filed at 03/21/2024 1945 Gross per 24 hour  Intake 740.1 ml  Output --  Net 740.1 ml     Assessment/Plan:  73 y.o. female is s/p TEVAR with subclavian  branch endoprosthesis 3 Days Post-Op   Yesterday the patient was having issues with standing, and associated dizziness.  NIH was 0, however I was concerned for stroke.  She underwent MRI overnight which demonstrated an embolic event with small, punctate infarcts involving bilateral hemispheres.  On exam this morning, Voncille seems to be doing well.  I had a long conversation with her and her husband regarding her MRI, and called her son as well.  The timing to the stroke is atypical as she had no symptoms immediately after surgery.  The event could be from thrombus formation within the heart or zone 0 of the aorta as it involved bilateral hemispheres.  We discussed that the cerebellar infarct is the likely reason for her unsteadiness when standing.  I'm glad she felt a little bit better this morning.  I have consulted neurology for recommendations.  She is on best medical therapy, Pradaxa , aspirin.  I think that she would be a good candidate for inpatient rehab and would appreciate physical therapy evaluating.  In a perfect world, I would like to get central imaging of the chest to assess my repair, however her IV blew yesterday.  I  do not want to have a similar episode in the left arm, where she underwent brachial artery cutdown, embolectomy.  In an effort to quantify flow, I ordered a carotid duplex ultrasound which demonstrated antegrade flow in bilateral carotid arteries, bilateral vertebral arteries.  She has no new deficits this morning. Palpable ulnar at the left wrist, palpable pulses in the feet. All home meds have been continued. Has some baseline tachycardia.  Plan to get an EKG, and with recent stroke, will also get an echocardiogram.  I reviewed intraoperative imaging.  Again, I am happy with aortic coverage, but am unhappy with the fact that she required stenting of the axillary and brachial arteries to reestablish flow to the left upper extremity. Obviously unhappy with her delayed  stroke.  She is aware the stents will be a problem in the future, and she may require subsequent extra-anatomic bypass.  For the time being, she has a palpable pulse at the wrist.  I think that the radial artery is occluded, but she has an intact palmar arch with an excellent triphasic signal.  No plans for further revascularization at this time.   [name] MD [facility] Phone Number: [phone] 03/22/2024 7:49 AM

## 2024-03-23 ENCOUNTER — Other Ambulatory Visit (HOSPITAL_COMMUNITY): Payer: Self-pay

## 2024-03-23 ENCOUNTER — Encounter (HOSPITAL_COMMUNITY): Payer: Self-pay | Admitting: Physical Medicine & Rehabilitation

## 2024-03-23 ENCOUNTER — Inpatient Hospital Stay (HOSPITAL_COMMUNITY)
Admission: AD | Admit: 2024-03-23 | Discharge: 2024-03-30 | DRG: 057 | Disposition: A | Discharge status: Home / Self Care | Source: Intra-hospital | Attending: Physical Medicine & Rehabilitation | Admitting: Physical Medicine & Rehabilitation

## 2024-03-23 ENCOUNTER — Inpatient Hospital Stay (HOSPITAL_COMMUNITY)

## 2024-03-23 ENCOUNTER — Other Ambulatory Visit: Payer: Self-pay

## 2024-03-23 DIAGNOSIS — Z7901 Long term (current) use of anticoagulants: Secondary | ICD-10-CM

## 2024-03-23 DIAGNOSIS — I4891 Unspecified atrial fibrillation: Secondary | ICD-10-CM | POA: Diagnosis present

## 2024-03-23 DIAGNOSIS — Z809 Family history of malignant neoplasm, unspecified: Secondary | ICD-10-CM

## 2024-03-23 DIAGNOSIS — Z833 Family history of diabetes mellitus: Secondary | ICD-10-CM | POA: Diagnosis not present

## 2024-03-23 DIAGNOSIS — Z79899 Other long term (current) drug therapy: Secondary | ICD-10-CM

## 2024-03-23 DIAGNOSIS — I69354 Hemiplegia and hemiparesis following cerebral infarction affecting left non-dominant side: Secondary | ICD-10-CM | POA: Diagnosis not present

## 2024-03-23 DIAGNOSIS — Z85828 Personal history of other malignant neoplasm of skin: Secondary | ICD-10-CM

## 2024-03-23 DIAGNOSIS — I951 Orthostatic hypotension: Secondary | ICD-10-CM | POA: Diagnosis present

## 2024-03-23 DIAGNOSIS — E785 Hyperlipidemia, unspecified: Secondary | ICD-10-CM | POA: Diagnosis present

## 2024-03-23 DIAGNOSIS — I69319 Unspecified symptoms and signs involving cognitive functions following cerebral infarction: Secondary | ICD-10-CM

## 2024-03-23 DIAGNOSIS — M7918 Myalgia, other site: Secondary | ICD-10-CM | POA: Diagnosis not present

## 2024-03-23 DIAGNOSIS — I69322 Dysarthria following cerebral infarction: Secondary | ICD-10-CM | POA: Diagnosis not present

## 2024-03-23 DIAGNOSIS — Z8249 Family history of ischemic heart disease and other diseases of the circulatory system: Secondary | ICD-10-CM

## 2024-03-23 DIAGNOSIS — R600 Localized edema: Secondary | ICD-10-CM

## 2024-03-23 DIAGNOSIS — M810 Age-related osteoporosis without current pathological fracture: Secondary | ICD-10-CM | POA: Diagnosis present

## 2024-03-23 DIAGNOSIS — J9811 Atelectasis: Secondary | ICD-10-CM | POA: Diagnosis not present

## 2024-03-23 DIAGNOSIS — I739 Peripheral vascular disease, unspecified: Secondary | ICD-10-CM | POA: Diagnosis present

## 2024-03-23 DIAGNOSIS — I639 Cerebral infarction, unspecified: Secondary | ICD-10-CM | POA: Diagnosis not present

## 2024-03-23 DIAGNOSIS — Z7983 Long term (current) use of bisphosphonates: Secondary | ICD-10-CM | POA: Diagnosis not present

## 2024-03-23 DIAGNOSIS — Z7982 Long term (current) use of aspirin: Secondary | ICD-10-CM

## 2024-03-23 DIAGNOSIS — I4819 Other persistent atrial fibrillation: Secondary | ICD-10-CM | POA: Diagnosis present

## 2024-03-23 DIAGNOSIS — Z8261 Family history of arthritis: Secondary | ICD-10-CM

## 2024-03-23 DIAGNOSIS — K219 Gastro-esophageal reflux disease without esophagitis: Secondary | ICD-10-CM | POA: Diagnosis present

## 2024-03-23 DIAGNOSIS — I71019 Dissection of thoracic aorta, unspecified: Secondary | ICD-10-CM | POA: Diagnosis present

## 2024-03-23 DIAGNOSIS — Z8679 Personal history of other diseases of the circulatory system: Secondary | ICD-10-CM

## 2024-03-23 DIAGNOSIS — D6869 Other thrombophilia: Secondary | ICD-10-CM | POA: Diagnosis present

## 2024-03-23 DIAGNOSIS — D72829 Elevated white blood cell count, unspecified: Secondary | ICD-10-CM | POA: Diagnosis present

## 2024-03-23 DIAGNOSIS — Z823 Family history of stroke: Secondary | ICD-10-CM | POA: Diagnosis not present

## 2024-03-23 DIAGNOSIS — K5900 Constipation, unspecified: Secondary | ICD-10-CM | POA: Diagnosis present

## 2024-03-23 DIAGNOSIS — I6310 Cerebral infarction due to embolism of unspecified precerebral artery: Secondary | ICD-10-CM

## 2024-03-23 DIAGNOSIS — Z95828 Presence of other vascular implants and grafts: Secondary | ICD-10-CM

## 2024-03-23 DIAGNOSIS — Z821 Family history of blindness and visual loss: Secondary | ICD-10-CM

## 2024-03-23 DIAGNOSIS — I6349 Cerebral infarction due to embolism of other cerebral artery: Secondary | ICD-10-CM

## 2024-03-23 DIAGNOSIS — Z818 Family history of other mental and behavioral disorders: Secondary | ICD-10-CM | POA: Diagnosis not present

## 2024-03-23 DIAGNOSIS — I712 Thoracic aortic aneurysm, without rupture, unspecified: Secondary | ICD-10-CM | POA: Diagnosis present

## 2024-03-23 DIAGNOSIS — D62 Acute posthemorrhagic anemia: Secondary | ICD-10-CM | POA: Diagnosis not present

## 2024-03-23 DIAGNOSIS — Z860101 Personal history of adenomatous and serrated colon polyps: Secondary | ICD-10-CM

## 2024-03-23 LAB — BASIC METABOLIC PANEL WITH GFR
Anion gap: 12 (ref 5–15)
BUN: 11 mg/dL (ref 8–23)
CO2: 20 mmol/L — ABNORMAL LOW (ref 22–32)
Calcium: 8.1 mg/dL — ABNORMAL LOW (ref 8.9–10.3)
Chloride: 104 mmol/L (ref 98–111)
Creatinine, Ser: 0.64 mg/dL (ref 0.44–1.00)
GFR, Estimated: >60 mL/min (ref >60)
Glucose, Bld: 122 mg/dL — ABNORMAL HIGH (ref 70–99)
Potassium: 3.7 mmol/L (ref 3.5–5.1)
Sodium: 136 mmol/L (ref 135–145)

## 2024-03-23 LAB — CBC
HCT: 28.4 % — ABNORMAL LOW (ref 36.0–46.0)
Hemoglobin: 9.5 g/dL — ABNORMAL LOW (ref 12.0–15.0)
MCH: 30.4 pg (ref 26.0–34.0)
MCHC: 33.5 g/dL (ref 30.0–36.0)
MCV: 91 fL (ref 80.0–100.0)
Platelets: 248 K/uL (ref 150–400)
RBC: 3.12 MIL/uL — ABNORMAL LOW (ref 3.87–5.11)
RDW: 15.3 % (ref 11.5–15.5)
WBC: 12.9 K/uL — ABNORMAL HIGH (ref 4.0–10.5)
nRBC: 0 % (ref 0.0–0.2)

## 2024-03-23 MED ORDER — DABIGATRAN ETEXILATE MESYLATE 150 MG PO CAPS
150.0000 mg | ORAL_CAPSULE | Freq: Two times a day (BID) | ORAL | Status: DC
  Administered 2024-03-23 – 2024-03-30 (×14): 150 mg via ORAL
  Filled 2024-03-23 (×14): qty 1

## 2024-03-23 MED ORDER — DIPHENHYDRAMINE HCL 25 MG PO CAPS: 25.0000 mg | ORAL_CAPSULE | Freq: Four times a day (QID) | ORAL | Status: DC | PRN

## 2024-03-23 MED ORDER — PROCHLORPERAZINE 25 MG RE SUPP: 12.5000 mg | Freq: Four times a day (QID) | RECTAL | Status: DC | PRN

## 2024-03-23 MED ORDER — CYCLOBENZAPRINE HCL 10 MG PO TABS
10.0000 mg | ORAL_TABLET | Freq: Three times a day (TID) | ORAL | Status: DC | PRN
  Administered 2024-03-26 – 2024-03-27 (×2): 10 mg via ORAL
  Filled 2024-03-23 (×2): qty 1

## 2024-03-23 MED ORDER — SIMVASTATIN 20 MG PO TABS
10.0000 mg | ORAL_TABLET | Freq: Every day | ORAL | Status: DC
Start: 2024-03-23 — End: 2024-03-23

## 2024-03-23 MED ORDER — METOPROLOL SUCCINATE ER 25 MG PO TB24
12.5000 mg | ORAL_TABLET | Freq: Every day | ORAL | Status: DC
  Administered 2024-03-24 – 2024-03-25 (×2): 12.5 mg via ORAL
  Filled 2024-03-23 (×2): qty 1

## 2024-03-23 MED ORDER — GUAIFENESIN-DM 100-10 MG/5ML PO SYRP
5.0000 mL | ORAL_SOLUTION | Freq: Four times a day (QID) | ORAL | Status: DC | PRN
  Administered 2024-03-26: 10 mL via ORAL
  Filled 2024-03-23: qty 10

## 2024-03-23 MED ORDER — POLYETHYLENE GLYCOL 3350 17 G PO PACK: 17.0000 g | PACK | Freq: Every day | ORAL | Status: DC | PRN

## 2024-03-23 MED ORDER — OXYCODONE HCL 5 MG PO TABS
5.0000 mg | ORAL_TABLET | ORAL | Status: DC | PRN
  Administered 2024-03-26 – 2024-03-27 (×2): 5 mg via ORAL
  Filled 2024-03-23 (×3): qty 1

## 2024-03-23 MED ORDER — PHENOL 1.4 % MT LIQD: 1.0000 | OROMUCOSAL | Status: DC | PRN

## 2024-03-23 MED ORDER — SENNOSIDES-DOCUSATE SODIUM 8.6-50 MG PO TABS
1.0000 | ORAL_TABLET | Freq: Two times a day (BID) | ORAL | Status: DC
  Administered 2024-03-23 – 2024-03-29 (×9): 1 via ORAL
  Filled 2024-03-23 (×13): qty 1

## 2024-03-23 MED ORDER — ORAL CARE MOUTH RINSE: 15.0000 mL | OROMUCOSAL | Status: DC | PRN

## 2024-03-23 MED ORDER — FAMOTIDINE 20 MG PO TABS: 20.0000 mg | ORAL_TABLET | Freq: Every day | ORAL | Status: DC | PRN

## 2024-03-23 MED ORDER — DOCUSATE SODIUM 100 MG PO CAPS
100.0000 mg | ORAL_CAPSULE | Freq: Every morning | ORAL | Status: DC
  Administered 2024-03-24 – 2024-03-27 (×3): 100 mg via ORAL
  Filled 2024-03-23 (×4): qty 1

## 2024-03-23 MED ORDER — SIMVASTATIN 20 MG PO TABS
10.0000 mg | ORAL_TABLET | Freq: Every day | ORAL | Status: DC
  Administered 2024-03-23 – 2024-03-29 (×7): 10 mg via ORAL
  Filled 2024-03-23 (×7): qty 1

## 2024-03-23 MED ORDER — POLYETHYLENE GLYCOL 3350 17 G PO PACK
17.0000 g | PACK | Freq: Every day | ORAL | Status: DC
  Administered 2024-03-24 – 2024-03-27 (×3): 17 g via ORAL
  Filled 2024-03-23 (×7): qty 1

## 2024-03-23 MED ORDER — ASPIRIN 81 MG PO TBEC
81.0000 mg | DELAYED_RELEASE_TABLET | Freq: Every day | ORAL | Status: DC
  Administered 2024-03-24 – 2024-03-30 (×7): 81 mg via ORAL
  Filled 2024-03-23 (×7): qty 1

## 2024-03-23 MED ORDER — BISACODYL 10 MG RE SUPP: 10.0000 mg | Freq: Every day | RECTAL | Status: DC | PRN

## 2024-03-23 MED ORDER — PANTOPRAZOLE SODIUM 40 MG PO TBEC
40.0000 mg | DELAYED_RELEASE_TABLET | Freq: Every day | ORAL | Status: DC
  Administered 2024-03-24 – 2024-03-30 (×7): 40 mg via ORAL
  Filled 2024-03-23 (×7): qty 1

## 2024-03-23 MED ORDER — FLEET ENEMA RE ENEM: 1.0000 | ENEMA | Freq: Once | RECTAL | Status: DC | PRN

## 2024-03-23 MED ORDER — TRAZODONE HCL 50 MG PO TABS
25.0000 mg | ORAL_TABLET | Freq: Every evening | ORAL | Status: DC | PRN
  Filled 2024-03-23: qty 1

## 2024-03-23 MED ORDER — PROCHLORPERAZINE EDISYLATE 10 MG/2ML IJ SOLN: 5.0000 mg | Freq: Four times a day (QID) | INTRAMUSCULAR | Status: DC | PRN

## 2024-03-23 MED ORDER — PROCHLORPERAZINE MALEATE 5 MG PO TABS: 5.0000 mg | ORAL_TABLET | Freq: Four times a day (QID) | ORAL | Status: DC | PRN

## 2024-03-23 MED ORDER — ACETAMINOPHEN 325 MG PO TABS
325.0000 mg | ORAL_TABLET | ORAL | Status: DC | PRN
  Administered 2024-03-24 – 2024-03-26 (×8): 650 mg via ORAL
  Administered 2024-03-27: 325 mg via ORAL
  Administered 2024-03-27 – 2024-03-29 (×4): 650 mg via ORAL
  Filled 2024-03-23 (×16): qty 2

## 2024-03-23 MED ORDER — ASPIRIN 81 MG PO TBEC
81.0000 mg | DELAYED_RELEASE_TABLET | Freq: Every day | ORAL | 12 refills | Status: AC
  Filled 2024-03-23: qty 30, 30d supply, fill #0

## 2024-03-23 NOTE — Progress Notes (Signed)
 Inpatient Rehab Coordinator Note:  I met with patient and her spouse at bedside to discuss CIR recommendations and goals/expectations of CIR stay.  We reviewed 3 hrs/day of therapy, physician follow up, and average length of stay 2 weeks (dependent upon progress) with goals of supervision to mod I.  We reviewed Medicare benefits.  I have a bed for her to admit to CIR pending clearance from [facility].  Will follow.   Reche Lowers, PT, DPT Admissions Coordinator [phone] 03/23/24 11:23 AM

## 2024-03-23 NOTE — Evaluation (Signed)
 Occupational Therapy Evaluation Patient Details Name: Terri Alexander MRN: [id number] DOB: 05-12-1951 Today's Date: 03/23/2024   History of Present Illness   73 y.o. female admitted 11/3 due to  history of descending thoracic aortic aneurysm extending from zone 3, to zone 4.  Pt is s/p TEVAR with subclavian branch endoprosthesis performed 11/3. 2 days post op pt with episode and rapid response called with suspected CVA.   MRI  demonstrated an embolic event with small, punctate infarcts involving bilateral hemispheres. PMH: afib with ablation, cardioversion     Clinical Impressions This 73 yo female admitted and underwent above presents to acute OT with PLOF of being totally independent with all basic ADLs, IADLs, and driving. Currently she is setup-min A for basic ADLs at a RW level with a tendency for a posterior lean intermittently when up on her feet. She will continue to benefit from acute OT with follow up from intensive inpatient follow-up therapy, >3 hours/day       If plan is discharge home, recommend the following:   A little help with walking and/or transfers;A little help with bathing/dressing/bathroom;Assistance with cooking/housework;Assist for transportation;Help with stairs or ramp for entrance     Functional Status Assessment   Patient has had a recent decline in their functional status and demonstrates the ability to make significant improvements in function in a reasonable and predictable amount of time.     Equipment Recommendations   None recommended by OT      Precautions/Restrictions   Precautions Precautions: Fall Recall of Precautions/Restrictions: Intact Restrictions Weight Bearing Restrictions Per Provider Order: No     Mobility Bed Mobility               General bed mobility comments: pt up in recliner    Transfers Overall transfer level: Needs assistance Equipment used: Rolling walker (2 wheels) Transfers: Sit to/from Stand Sit  to Stand: Min assist                  Balance Overall balance assessment: Needs assistance Sitting-balance support: No upper extremity supported, Feet supported Sitting balance-Leahy Scale: Good     Standing balance support: No upper extremity supported, During functional activity Standing balance-Leahy Scale: Poor Standing balance comment: posterior lean in standng and with ambulation intermittently                           ADL either performed or assessed with clinical judgement   ADL Overall ADL's : Needs assistance/impaired Eating/Feeding: Independent;Sitting   Grooming: Minimal assistance;Wash/dry hands Grooming Details (indicate cue type and reason): for balance standing (posterior lean) Upper Body Bathing: Set up;Sitting   Lower Body Bathing: Minimal assistance Lower Body Bathing Details (indicate cue type and reason): for balance in standing and for sit<>stand Upper Body Dressing : Set up;Sitting   Lower Body Dressing: Minimal assistance Lower Body Dressing Details (indicate cue type and reason): for balance in standing and for sit<>stand Toilet Transfer: Minimal assistance;Ambulation;Regular Toilet;Grab bars Toilet Transfer Details (indicate cue type and reason): for balance in standing and for sit<>stand Toileting- Clothing Manipulation and Hygiene: Minimal assistance Toileting - Clothing Manipulation Details (indicate cue type and reason): for balance in standing and for sit<>stand       General ADL Comments: Did dicuss that a grab bar at door with one step would help with safety up and down that one step     Vision Baseline Vision/History: 1 Wears glasses (distance) Ability to See in  Adequate Light: 0 Adequate Patient Visual Report: No change from baseline Vision Assessment?: Yes Eye Alignment: Within Functional Limits Ocular Range of Motion: Within Functional Limits Tracking/Visual Pursuits: Able to track stimulus in all quads without  difficulty Convergence: Within functional limits Visual Fields: No apparent deficits            Pertinent Vitals/Pain Pain Assessment Pain Assessment: Faces Faces Pain Scale: Hurts little more Pain Location: back with taking off and putting back on socks Pain Descriptors / Indicators: Aching, Discomfort Pain Intervention(s): Limited activity within patient's tolerance, Monitored during session, Repositioned     Extremity/Trunk Assessment Upper Extremity Assessment Upper Extremity Assessment: Overall WFL for tasks assessed;Right hand dominant (did have a little trouble with alternating finger to nose)           Communication Communication Communication: No apparent difficulties   Cognition Arousal: Alert Behavior During Therapy: WFL for tasks assessed/performed                                 Following commands: Intact       Cueing  Cueing Techniques: Verbal cues              Home Living Family/patient expects to be discharged to:: Private residence Living Arrangements: Spouse/significant other Available Help at Discharge: Family;Available 24 hours/day Type of Home: House Home Access: Stairs to enter [employer] of Steps: 1   Home Layout: One level     Bathroom Shower/Tub: Walk-in Pensions Consultant: Handicapped height     Home Equipment: Shower seat - built in;Grab bars - tub/shower;Hand held shower head          Prior Functioning/Environment Prior Level of Function : Driving;Independent/Modified Independent             Mobility Comments: I PTA no device ADLs Comments: took tub baths per pt, I B/D PTA    OT Problem List: Impaired balance (sitting and/or standing);Pain   OT Treatment/Interventions: Self-care/ADL training;DME and/or AE instruction;Balance training;Patient/family education      OT Goals(Current goals can be found in the care plan section)   Acute Rehab OT Goals Patient Stated  Goal: hopeful for rehab then home OT Goal Formulation: With patient/family Time For Goal Achievement: 04/06/24 Potential to Achieve Goals: Good   OT Frequency:  Min 2X/week       AM-PAC OT 6 Clicks Daily Activity     Outcome Measure Help from another person eating meals?: None Help from another person taking care of personal grooming?: A Little Help from another person toileting, which includes using toliet, bedpan, or urinal?: A Little Help from another person bathing (including washing, rinsing, drying)?: A Little Help from another person to put on and taking off regular upper body clothing?: A Little Help from another person to put on and taking off regular lower body clothing?: A Little 6 Click Score: 19   End of Session Equipment Utilized During Treatment: Gait belt;Rolling walker (2 wheels)  Activity Tolerance: Patient tolerated treatment well Patient left: in chair;with call bell/phone within reach (no alarm under pt upon arrival and pt cognitively aware to call)  OT Visit Diagnosis: Unsteadiness on feet (R26.81);Other abnormalities of gait and mobility (R26.89);Muscle weakness (generalized) (M62.81);Pain Pain - part of body:  (back)                Time: 1021-1040 OT Time Calculation (min): 19 min Charges:  OT General Charges $OT  Visit: 1 Visit OT Evaluation $OT Eval Moderate Complexity: 1 Mod  Cathy L. OT Acute Rehabilitation Services Office [phone]    [place] Dorothyann Distel 03/23/2024, 10:55 AM

## 2024-03-23 NOTE — PMR Pre-admission (Signed)
 PMR Admission Coordinator Pre-Admission Assessment  Patient: Terri Alexander is an 73 y.o., female MRN: [id number] DOB: 1950-11-27 Height: 5' 2 (157.5 cm) Weight: 63.5 kg  Insurance Information HMO:     PPO:      PCP:      IPA:      80/20:      OTHER:  PRIMARY: Medicare Part A and B      Policy#: [id number]       Subscriber:  CM Name:       Phone#:      Fax#:  Pre-Cert#: verified Health And Safety Inspector:  Benefits:  Phone #:      Name:  Eff. Date: A/B 04/17/2015     Deduct: $1676      Out of Pocket Max: n/a      Life Max: n/a CIR: 100%      SNF: 20 full days Outpatient: 80%     Co-Pay: 20% Home Health: 100%      Co-Pay:  DME: 80%     Co-Pay: 20% Providers:  SECONDARY: UHC PPO      Policy#: [id number]     Phone#: [phone]  Financial Counselor:       Phone#:   The "Data Collection Information Summary" for patients in Inpatient Rehabilitation Facilities with attached "Privacy Act Statement-Health Care Records" was provided and verbally reviewed with: Patient and Family  Emergency Contact Information Contact Information     Name Relation Home Work Mobile   Wetmore,Dennis Spouse [phone]  [phone]   Harsha,Devin Son   [phone]      Other Contacts   None on File     Current Medical History  Patient Admitting Diagnosis: CVA following TEVAR  History of Present Illness: Pt is a 73 y/o female with PMH of afib s/p ablation, osteoporosis, skin cancer, and PVD with type B aortic dissection  with aneurysmal dilation of the descending aorta who was admitted to [facility] on 11/3 for a planned TEVAR with Dr. Lanis.  POD 2 pt developed dizziness, nausea, and unsteadiness with gait.  Rapid response initiated and recommended sleep/wake cycle and avoiding sedating medications.  Due to persistent symptoms an MRI was completed and revealed small, punctate infarcts in bilateral frontal lobes and left occipital/cerebellar.  Neurology was consulted and workup completed with recommendations  for continued pradaxa  and asa.  Therapy evaluations were completed and pt was recommended for CIR.   Complete NIHSS TOTAL: 0  Patient's medical record from [name] has been reviewed by the rehabilitation admission coordinator and physician.  Past Medical History  Past Medical History:  Diagnosis Date   Adenomatous colon polyp    Allergy    Anemia    as a child, no current problem   Arrhythmia    Atrial fibrillation (HCC)    Blood transfusion without reported diagnosis @ 9 months   9 months old   Cataract    bilateral - MD just watching    Dysrhythmia    A. Fib   GERD (gastroesophageal reflux disease)    History of hiatal hernia    Hyperlipidemia    Osteoporosis 04/15/2016   Peripheral vascular disease    Type B Aortic Dissection   Skin cancer 2013   Skin cancer- Nose, face    Has the patient had major surgery during 100 days prior to admission? Yes  Family History   family history includes Arrhythmia in her maternal aunt and mother; Arthritis in her mother; Cancer in her  maternal grandfather and maternal grandmother; Depression in her sister and sister; Diabetes in an other family member; Early death in her maternal grandfather and paternal grandfather; Heart disease in her maternal grandfather, mother, and paternal grandfather; Stroke in her mother; Transient ischemic attack (age of onset: 45) in her sister; Vision loss in her mother.  Current Medications  Current Facility-Administered Medications:    acetaminophen  (TYLENOL ) tablet 325-650 mg, 325-650 mg, Oral, Q4H PRN, 650 mg at 03/22/24 2049 **OR** acetaminophen  (TYLENOL ) suppository 325-650 mg, 325-650 mg, Rectal, Q4H PRN, Eveland, Matthew, PA-C   aspirin EC tablet 81 mg, 81 mg, Oral, Q0600, Eveland, Matthew, PA-C, 81 mg at 03/23/24 0500   bisacodyl (DULCOLAX) EC tablet 5 mg, 5 mg, Oral, Daily PRN, Eveland, Matthew, PA-C   chlorhexidine  (PERIDEX ) 0.12 % solution 15 mL, 15 mL, Mouth/Throat, Once, Eveland, Matthew,  PA-C   cyclobenzaprine  (FLEXERIL ) tablet 10 mg, 10 mg, Oral, TID PRN, Eveland, Matthew, PA-C, 10 mg at 03/23/24 0500   dabigatran  (PRADAXA ) capsule 150 mg, 150 mg, Oral, Q12H, Eveland, Matthew, PA-C, 150 mg at 03/23/24 0820   docusate sodium  (COLACE) capsule 100 mg, 100 mg, Oral, q AM, Eveland, Matthew, PA-C, 100 mg at 03/23/24 9178   famotidine  (PEPCID ) tablet 20 mg, 20 mg, Oral, Daily PRN, Eveland, Matthew, PA-C, 20 mg at 03/19/24 2226   metoprolol  succinate (TOPROL -XL) 24 hr tablet 12.5 mg, 12.5 mg, Oral, Daily, Eveland, Matthew, PA-C, 12.5 mg at 03/23/24 0820   metoprolol  tartrate (LOPRESSOR ) injection 5 mg, 5 mg, Intravenous, Q2H PRN, Pearline Norman RAMAN, MD   Oral care mouth rinse, 15 mL, Mouth Rinse, PRN, Eveland, Matthew, PA-C   oxyCODONE (Oxy IR/ROXICODONE) immediate release tablet 5-10 mg, 5-10 mg, Oral, Q4H PRN, Eveland, Matthew, PA-C, 10 mg at 03/22/24 1908   pantoprazole (PROTONIX) EC tablet 40 mg, 40 mg, Oral, Daily, Eveland, Matthew, PA-C, 40 mg at 03/23/24 0820   pantoprazole (PROTONIX) EC tablet 40 mg, 40 mg, Oral, Daily PRN, Eveland, Matthew, PA-C   phenol (CHLORASEPTIC) mouth spray 1 spray, 1 spray, Mouth/Throat, PRN, Eveland, Matthew, PA-C   polyethylene glycol (MIRALAX  / GLYCOLAX ) packet 17 g, 17 g, Oral, Daily PRN, Eveland, Matthew, PA-C   potassium chloride  SA (KLOR-CON  M) CR tablet 40-60 mEq, 40-60 mEq, Oral, Daily PRN, Eveland, Matthew, PA-C  Patients Current Diet:  Diet Order             Diet - low sodium heart healthy           Diet regular Room service appropriate? Yes; Fluid consistency: Thin  Diet effective now                   Precautions / Restrictions Precautions Precautions: Fall Restrictions Weight Bearing Restrictions Per Provider Order: No   Has the patient had 2 or more falls or a fall with injury in the past year? No  Prior Activity Level Community (5-7x/wk): indep, no DME prior to admit, driving  Prior Functional Level Self Care: Did the  patient need help bathing, dressing, using the toilet or eating? Independent  Indoor Mobility: Did the patient need assistance with walking from room to room (with or without device)? Independent  Stairs: Did the patient need assistance with internal or external stairs (with or without device)? Independent  Functional Cognition: Did the patient need help planning regular tasks such as shopping or remembering to take medications? Independent  Patient Information Are you of Hispanic, Latino/a,or Spanish origin?: A. No, not of Hispanic, Latino/a, or Spanish origin What is  your race?: A. White Do you need or want an interpreter to communicate with a doctor or health care staff?: 0. No  Patient's Response To:  Health Literacy and Transportation Is the patient able to respond to health literacy and transportation needs?: Yes Health Literacy - How often do you need to have someone help you when you read instructions, pamphlets, or other written material from your doctor or pharmacy?: Never In the past 12 months, has lack of transportation kept you from medical appointments or from getting medications?: No In the past 12 months, has lack of transportation kept you from meetings, work, or from getting things needed for daily living?: No  Journalist, Newspaper / Equipment Home Equipment: Shower seat - built in, Grab bars - tub/shower, Hand held shower head  Prior Device Use: Indicate devices/aids used by the patient prior to current illness, exacerbation or injury? None of the above  Current Functional Level Cognition  Orientation Level: Oriented X4    Extremity Assessment (includes Sensation/Coordination)  Upper Extremity Assessment: Overall WFL for tasks assessed, Right hand dominant (did have a little trouble with alternating finger to nose)  Lower Extremity Assessment: LLE deficits/detail LLE Deficits / Details: grossly 4-/5 LLE Coordination: decreased gross motor, decreased fine motor     ADLs  Overall ADL's : Needs assistance/impaired Eating/Feeding: Independent, Sitting Grooming: Minimal assistance, Wash/dry hands Grooming Details (indicate cue type and reason): for balance standing (posterior lean) Upper Body Bathing: Set up, Sitting Lower Body Bathing: Minimal assistance Lower Body Bathing Details (indicate cue type and reason): for balance in standing and for sit<>stand Upper Body Dressing : Set up, Sitting Lower Body Dressing: Minimal assistance Lower Body Dressing Details (indicate cue type and reason): for balance in standing and for sit<>stand Toilet Transfer: Minimal assistance, Ambulation, Regular Toilet, Grab bars Toilet Transfer Details (indicate cue type and reason): for balance in standing and for sit<>stand Toileting- Clothing Manipulation and Hygiene: Minimal assistance Toileting - Clothing Manipulation Details (indicate cue type and reason): for balance in standing and for sit<>stand General ADL Comments: Did dicuss that a grab bar at door with one step would help with safety up and down that one step    Mobility  Overal bed mobility: Independent General bed mobility comments: pt up in recliner    Transfers  Overall transfer level: Needs assistance Equipment used: Rolling walker (2 wheels) Transfers: Sit to/from Stand Sit to Stand: Min assist General transfer comment: Pt requiring assist due to poor awareness of posture upon standing with pt losing balance posteriorly. Poor coordination of trunk and left LE.    Ambulation / Gait / Stairs / Wheelchair Mobility  Ambulation/Gait Ambulation/Gait assistance: Mod assist Gait Distance (Feet): 65 Feet Assistive device: None, Rolling walker (2 wheels) Gait Pattern/deviations: Decreased step length - left, Decreased dorsiflexion - left, Decreased stance time - left, Knee flexed in stance - left, Leaning posteriorly, Staggering right, Staggering left, Trunk flexed, Ataxic General Gait Details: Pt needs  mod assist to ambulate without device with LOB all directions due to poor coordination of trunk and left LE.  Does better with RW although still sone unsteadiness due to left LE decr dorsiflexion which worsens as she walks further. Gait velocity interpretation: <1.31 ft/sec, indicative of household ambulator    Posture / Balance Balance Overall balance assessment: Needs assistance Sitting-balance support: No upper extremity supported, Feet supported Sitting balance-Leahy Scale: Good Standing balance support: No upper extremity supported, During functional activity Standing balance-Leahy Scale: Poor Standing balance comment: posterior lean in  standng and with ambulation intermittently    Special considerations/life events  Skin surgical incisions to bilat groins and LUE and Diabetic management A1C 5.1   Previous Home Environment (from acute therapy documentation) Living Arrangements: Spouse/significant other Available Help at Discharge: Family, Available 24 hours/day Type of Home: House Home Layout: One level Home Access: Stairs to enter [employer] of Steps: 1 Bathroom Shower/Tub: Psychologist, counselling, Engineer, Building Services: Handicapped height Home Care Services: No  Discharge Living Setting Plans for Discharge Living Setting: Patient's home, Lives with (comment) (spouse) Type of Home at Discharge: House Discharge Home Layout: One level Discharge Home Access: Stairs to enter Entrance Stairs-Rails: None Entrance Stairs-Number of Steps: 1 Discharge Bathroom Shower/Tub: Tub/shower unit, Walk-in shower Discharge Bathroom Toilet: Handicapped height Discharge Bathroom Accessibility: Yes How Accessible: Accessible via walker Does the patient have any problems obtaining your medications?: No  Social/Family/Support Systems Patient Roles: Spouse Anticipated Caregiver: spouse, [name] Anticipated Industrial/product Designer Information: [phone] Ability/Limitations of Caregiver: none  stated Caregiver Availability: 24/7 Discharge Plan Discussed with Primary Caregiver: Yes Is Caregiver In Agreement with Plan?: Yes Does Caregiver/Family have Issues with Lodging/Transportation while Pt is in Rehab?: No  Goals Patient/Family Goal for Rehab: PT/OT supervision to mod I, SLP n/a Expected length of stay: 10-12 days Additional Information: Discharge plan: home with spouse, supervision/mod I level Pt/Family Agrees to Admission and willing to participate: Yes Program Orientation Provided & Reviewed with Pt/Caregiver Including Roles  & Responsibilities: Yes  Decrease burden of Care through IP rehab admission: n/a  Possible need for SNF placement upon discharge:  Not anticipated.  Expect supervision to mod I level goals with <2 week LOS.  Home with spouse 24/7.   Patient Condition: I have reviewed medical records from [facility], spoken with [facility] team, and patient and spouse. I met with patient at the bedside for inpatient rehabilitation assessment.  Patient will benefit from ongoing PT and OT, can actively participate in 3 hours of therapy a day 5 days of the week, and can make measurable gains during the admission.  Patient will also benefit from the coordinated team approach during an Inpatient Acute Rehabilitation admission.  The patient will receive intensive therapy as well as Rehabilitation physician, nursing, social worker, and care management interventions.  Due to safety, skin/wound care, disease management, medication administration, pain management, and patient education the patient requires 24 hour a day rehabilitation nursing.  The patient is currently min to mod assist with mobility and basic ADLs.  Discharge setting and therapy post discharge at home with home health is anticipated.  Patient has agreed to participate in the Acute Inpatient Rehabilitation Program and will admit today.  Preadmission Screen Completed By:  Reche FORBES Lowers, PT, DPT  03/23/2024 11:35  AM ______________________________________________________________________   Discussed status with Dr. Emeline on 03/23/24  at 11:49 AM  and received approval for admission today.  Admission Coordinator:  Caitlin E Warren, PT, DPT time  11:49 AM Pattricia 03/23/24    Assessment/Plan: Diagnosis: Multifocal CVAs s/p TEVAR with subclavian branch endoprosthesis  Does the need for close, 24 hr/day Medical supervision in concert with the patient's rehab needs make it unreasonable for this patient to be served in a less intensive setting? Yes Co-Morbidities requiring supervision/potential complications: Tachycardia, weakness, aphasia, anemia, poor pain control, constipation, and aortic dissection s/p TEVAR Due to safety, skin/wound care, disease management, medication administration, pain management, and patient education, does the patient require 24 hr/day rehab nursing? Yes Does the patient require coordinated care of a physician, rehab  nurse, PT, OT  to address physical and functional deficits in the context of the above medical diagnosis(es)? Yes Addressing deficits in the following areas: balance, endurance, locomotion, strength, transferring, bathing, dressing, feeding, grooming, toileting Can the patient actively participate in an intensive therapy program of at least 3 hrs of therapy 5 days a week? Yes The potential for patient to make measurable gains while on inpatient rehab is good Anticipated functional outcomes upon discharge from inpatient rehab: supervision PT, supervision OT, Estimated rehab length of stay to reach the above functional goals is: 10-12 days Anticipated discharge destination: Home 10. Overall Rehab/Functional Prognosis: good   MD Signature:  Joesph JAYSON Likes, DO 03/23/2024

## 2024-03-23 NOTE — Progress Notes (Signed)
 Progress Note    03/23/2024 7:44 AM 4 Days Post-Op  Subjective: She says she feels a little bit better this morning.  She does feel like she is breathing harder than normal this morning   Vitals:   03/23/24 0044 03/23/24 0456  BP: (!) 104/56 (!) 108/42  Pulse: 88   Resp: 16 20  Temp: 98 F (36.7 C) (!) 97.4 F (36.3 C)  SpO2: 96% 99%    Physical Exam: General: Laying in bed, no acute distress Cardiac: Tachycardic Lungs: Slightly increased work of breathing, on RA, SpO2 99 Incisions: Bilateral groin incisions and left arm incision intact and dry Extremities: Continued bruised appearance of bilateral groins, left greater than right and left arm.  No hematoma at incision sites.  Brisk left ulnar, radial, and palmar arch Doppler signals.  Palpable DP pulses bilaterally Neuro: Alert and oriented x 3, moving all extremities with slight diffuse weakness  CBC    Component Value Date/Time   WBC 13.4 (H) 03/22/2024 1041   RBC 2.78 (L) 03/22/2024 1041   HGB 8.5 (L) 03/22/2024 1041   HGB 13.6 11/09/2023 0842   HCT 25.1 (L) 03/22/2024 1041   HCT 42.7 11/09/2023 0842   PLT 174 03/22/2024 1041   PLT 335 11/09/2023 0842   MCV 90.3 03/22/2024 1041   MCV 94 11/09/2023 0842   MCH 30.6 03/22/2024 1041   MCHC 33.9 03/22/2024 1041   RDW 15.1 03/22/2024 1041   RDW 12.6 11/09/2023 0842   LYMPHSABS 2.3 09/15/2023 0223   MONOABS 0.9 09/15/2023 0223   EOSABS 92 10/03/2023 0956   BASOSABS 39 10/03/2023 0956    BMET    Component Value Date/Time   NA 136 03/22/2024 1041   NA 139 11/09/2023 0841   K 3.8 03/22/2024 1041   CL 106 03/22/2024 1041   CO2 21 (L) 03/22/2024 1041   GLUCOSE 168 (H) 03/22/2024 1041   BUN 7 (L) 03/22/2024 1041   BUN 21 11/09/2023 0841   CREATININE 0.67 03/22/2024 1041   CREATININE 1.13 (H) 10/03/2023 0956   CALCIUM 7.9 (L) 03/22/2024 1041   GFRNONAA >60 03/22/2024 1041   GFRNONAA 88 09/18/2019 1039   GFRAA 102 09/18/2019 1039    INR    Component  Value Date/Time   INR 1.3 (H) 03/19/2024 1700     Intake/Output Summary (Last 24 hours) at 03/23/2024 0744 Last data filed at 03/23/2024 0456 Gross per 24 hour  Intake 480 ml  Output --  Net 480 ml      Assessment/Plan:  73 y.o. female is 4 days post op, s/p: TEVAR with subclavian branch endoprosthesis    - Overall she is doing better this morning.  She feels like her weakness in her arms and legs is not as bad as it was a few days ago.  She does feel like she is breathing harder than normal this morning but denies shortness of breath - Bilateral groin and left arm incisions are well-appearing without further bleeding.  There is bruising around her incisions, however no hematoma present -Bilateral lower extremities well-perfused with palpable DP pulses -Left upper extremity remains well-perfused with brisk ulnar, radial, and palmar arch Doppler signals -She is moving all extremities fairly well with mild diffuse weakness - Tmax yesterday evening of 99.3.  Lab work pending this morning - Remains slightly tachycardic with heart rate in the 90s this morning.  Appears to be NSR on monitor - Echo was performed yesterday and was negative for thrombus - She was evaluated  by neurology yesterday and is on optimal medical management with Pradaxa  and aspirin - Will obtain CXR this morning.  She will continue to mobilize with PT/OT.  Hopefully will ultimately discharge to [facility]   Ahmed Holster, PA-C [facility] [phone] 03/23/2024 7:44 AM

## 2024-03-23 NOTE — Discharge Summary (Signed)
 [facility] Discharge Summary   Terri Alexander May 07, 1951 73 y.o. female  MRN: [id number]  Admission Date: 03/19/2024  Discharge Date: 03/23/2024  Physician: [name], MD  Admission Diagnosis: Aneurysm of descending thoracic aorta without rupture [I71.23] S/P AAA (abdominal aortic aneurysm) repair [S01.109, Z86.79] Thoracic aortic aneurysm, without rupture, unspecified [I71.20]  Discharge Day Diagnosis: Aneurysm of descending thoracic aorta without rupture [I71.23] S/P AAA (abdominal aortic aneurysm) repair [S01.109, Z86.79] Thoracic aortic aneurysm, without rupture, unspecified [I71.20]  Hospital Course:  The patient was admitted to the hospital and taken to the operating room on 03/19/2024 and underwent the following surgery: 1) percutaneous access bilateral common femoral arteries, 2) TEVAR with left subclavian sidebranch endoprosthesis, reinforced with VBX, 3) selective catheterization of the left radial artery, 4) left upper extremity angiogram, 5) left brachial artery cutdown with left upper extremity Fogarty embolectomy, 6) primary repair of left brachial artery and 7) left axillary, brachial, and subclavian artery stenting.  She tolerated the procedure well was transferred to the PACU in stable condition.  She was then transferred to the ICU for overnight monitoring.  POD 1 (03/20/2024) - She was doing fairly well with minimal pain.  Left upper extremity and bilateral lower extremities were well-perfused.  She had palpable pedal pulses and brisk left ulnar/radial/palmar arch Doppler signals.  All of her incisions were intact with significant bruising but no hematoma.  She was mildly tachycardic and was given 500 cc saline bolus.  Hemoglobin with slight drop from surgery, at 8.6.  Repeat hemoglobin later that evening was 9.6.  She was cleared to mobilize with PT/OT.  POD 2 (03/21/2024) - She had a dizziness event in the evening of POD 1 and rapid response was called.  NIH was  negative.  Her dizziness improved afterwards.  Again in the morning of POD 2 she had global weakness with standing and some dizziness.  MRI brain was ordered to rule out stroke.  We tried to obtain CTA of the chest, however her IV access was lost. All of her incisions remained well-appearing without evidence of further bleeding.  Her left arm remained well-perfused with brisk ulnar/radial/palmar arch Doppler signals.  Her Pradaxa  was restarted.  She was also restarted on metoprolol  for tachycardia.  POD 3 (03/22/2024) - MRI of the brain resulted with an embolic event with small, punctate infarcts involving bilateral hemispheres.  This could have been due to thrombus formation within the aorta after surgery.  Neurology was consulted.  They recommended no further intervention and continued medical management with daily aspirin and Pradaxa .  Overall her dizziness and weakness improved.  She continued to have good perfusion to both feet and her left hand.   POD 4 (03/23/2024)- Her mobility was improving with PT/OT and she was recommended to go to CIR at discharge.  She had no further dizziness or weakness events.  She had no other focal neurological deficits.  All of her incisions were well-appearing and intact without further bleeding.  She had palpable pedal pulses bilaterally.  She had brisk left ulnar/radial/palmar arch Doppler signals.  Her tachycardia had improved at rest.  Echo was also performed and was negative for decreased ventricular function or thrombus. CXR was ordered for some increased work of breathing, which demonstrated a stable, small area of atelectasis within the left lung.  Incentive spirometry was encouraged.  She was also to resume her home statin.  Overall she was tolerating a normal diet without swallowing difficulties.  She was also mobilized PT/OT.  She was  also voiding without difficulty.  She was discharged to CIR on 03/23/2024.   CBC    Component Value Date/Time   WBC 12.9 (H)  03/23/2024 1043   RBC 3.12 (L) 03/23/2024 1043   HGB 9.5 (L) 03/23/2024 1043   HGB 13.6 11/09/2023 0842   HCT 28.4 (L) 03/23/2024 1043   HCT 42.7 11/09/2023 0842   PLT 248 03/23/2024 1043   PLT 335 11/09/2023 0842   MCV 91.0 03/23/2024 1043   MCV 94 11/09/2023 0842   MCH 30.4 03/23/2024 1043   MCHC 33.5 03/23/2024 1043   RDW 15.3 03/23/2024 1043   RDW 12.6 11/09/2023 0842   LYMPHSABS 2.3 09/15/2023 0223   MONOABS 0.9 09/15/2023 0223   EOSABS 92 10/03/2023 0956   BASOSABS 39 10/03/2023 0956    BMET    Component Value Date/Time   NA 136 03/23/2024 0834   NA 139 11/09/2023 0841   K 3.7 03/23/2024 0834   CL 104 03/23/2024 0834   CO2 20 (L) 03/23/2024 0834   GLUCOSE 122 (H) 03/23/2024 0834   BUN 11 03/23/2024 0834   BUN 21 11/09/2023 0841   CREATININE 0.64 03/23/2024 0834   CREATININE 1.13 (H) 10/03/2023 0956   CALCIUM 8.1 (L) 03/23/2024 0834   GFRNONAA >60 03/23/2024 0834   GFRNONAA 88 09/18/2019 1039   GFRAA 102 09/18/2019 1039       Discharge Instructions     Ambulatory referral to Neurology   Complete by: As directed    An appointment is requested in approximately: 8 weeks; may see NP in clinic   Call MD for:  redness, tenderness, or signs of infection (pain, swelling, redness, odor or green/yellow discharge around incision site)   Complete by: As directed    Call MD for:  severe uncontrolled pain   Complete by: As directed    Call MD for:  temperature >100.4   Complete by: As directed    Diet - low sodium heart healthy   Complete by: As directed    Discharge wound care:   Complete by: As directed    Keep incisions clean and dry   Increase activity slowly   Complete by: As directed        Discharge Diagnosis:  Aneurysm of descending thoracic aorta without rupture [I71.23] S/P AAA (abdominal aortic aneurysm) repair [S01.109, Z86.79] Thoracic aortic aneurysm, without rupture, unspecified [I71.20]  Secondary Diagnosis: Patient Active Problem List    Diagnosis Date Noted   S/P AAA (abdominal aortic aneurysm) repair 03/19/2024   Thoracic aortic aneurysm, without rupture, unspecified 03/19/2024   Persistent atrial fibrillation (HCC) 08/25/2022   Hypercoagulable state due to persistent atrial fibrillation (HCC) 08/25/2022   Atrial fibrillation with RVR (HCC) 08/18/2022   Aortic dissection (HCC) 08/17/2022   Aortic dissection distal to left subclavian (HCC) 08/17/2022   Diverticulosis 04/19/2022   Diverticulosis of sigmoid colon 04/19/2022   Palpitations 06/24/2021   Premature atrial contraction 06/24/2021   PVC (premature ventricular contraction) 06/24/2021   Abnormal EKG 06/24/2021   Dyslipidemia 03/24/2018   Postmenopausal estrogen deficiency 03/24/2018   Squamous cell carcinoma 08/10/2017   HSV-1 (herpes simplex virus 1) infection 03/16/2017   Osteoporosis 04/15/2016   Vulvar atrophy 11/07/2014   Abnormal Pap smear of cervix 11/07/2014   Past Medical History:  Diagnosis Date   Adenomatous colon polyp    Allergy    Anemia    as a child, no current problem   Arrhythmia    Atrial fibrillation (HCC)    Blood transfusion  without reported diagnosis @ 9 months   70 months old   Cataract    bilateral - MD just watching    Dysrhythmia    A. Fib   GERD (gastroesophageal reflux disease)    History of hiatal hernia    Hyperlipidemia    Osteoporosis 04/15/2016   Peripheral vascular disease    Type B Aortic Dissection   Skin cancer 2013   Skin cancer- Nose, face     Allergies as of 03/23/2024   No Known Allergies      Medication List     TAKE these medications    acetaminophen  500 MG tablet Commonly known as: TYLENOL  Take 1,000 mg by mouth every 6 (six) hours as needed for moderate pain (pain score 4-6).   alendronate  70 MG tablet Commonly known as: FOSAMAX  TAKE 1 TABLET BY MOUTH ONCE A WEEK ON AN EMPTY STOMACH WITH A FULL GLASS OF WATER   aspirin EC 81 MG tablet Take 1 tablet (81 mg total) by mouth daily at 6  (six) AM. Swallow whole. Start taking on: March 24, 2024   Biotin 1000 MCG tablet Take 1,000 mcg by mouth daily.   CALCIUM 1200 PO Take 1 tablet by mouth in the morning.   cetirizine 10 MG tablet Commonly known as: ZYRTEC Take 10 mg by mouth daily.   Cranberry 500 MG Chew Chew 500 mg by mouth in the morning.   cyclobenzaprine  10 MG tablet Commonly known as: FLEXERIL  Take 1 tablet (10 mg total) by mouth 3 (three) times daily as needed for muscle spasms.   dabigatran  150 MG Caps capsule Commonly known as: PRADAXA  Take 1 capsule (150 mg total) by mouth 2 (two) times daily.   docusate sodium  100 MG capsule Commonly known as: COLACE Take 100 mg by mouth in the morning.   famotidine  20 MG tablet Commonly known as: PEPCID  Take 20 mg by mouth daily as needed for heartburn or indigestion.   FIBER-CAPS PO Take 1 capsule by mouth in the morning.   furosemide  40 MG tablet Commonly known as: LASIX  Take 1 tablet (40 mg total) by mouth as needed for edema.   L-Lysine 500 MG Caps Take 500 mg by mouth in the morning.   metoprolol  succinate 25 MG 24 hr tablet Commonly known as: Toprol  XL Take 0.5 tablets (12.5 mg total) by mouth daily.   multivitamin with minerals Tabs tablet Take 1 tablet by mouth in the morning.   omeprazole 20 MG tablet Commonly known as: PRILOSEC OTC Take 20 mg by mouth daily as needed (acid reflux).   PROBIOTIC PO Take 1 tablet by mouth in the morning.   SALONPAS PAIN RELIEF PATCH EX Apply 1 patch topically daily as needed (pain).   SALONPAS PAIN RELIEVING EX Apply 1 Application topically daily as needed (pain).   vitamin C 1000 MG tablet Take 1,000 mg by mouth in the morning.   Vitamin D3 125 MCG (5000 UT) Tabs Take 5,000 Units by mouth in the morning.               Discharge Care Instructions  (From admission, onward)           Start     Ordered   03/23/24 0000  Discharge wound care:       Comments: Keep incisions clean and  dry   03/23/24 1126            Discharge Instructions:   [facility]  Discharge Instructions  Endovascular Aortic Aneurysm Repair  Please refer to the following instructions for your post-procedure care. Your surgeon or Physician Assistant will discuss any changes with you.  Activity  You are encouraged to walk as much as you can. You can slowly return to normal activities but must avoid strenuous activity and heavy lifting until your doctor tells you it's OK. Avoid activities such as vacuuming or swinging a gold club. It is normal to feel tired for several weeks after your surgery. Do not drive until your doctor gives the OK and you are no longer taking prescription pain medications. It is also normal to have difficulty with sleep habits, eating, and bowel movements after surgery. These will go away with time.  Bathing/Showering  You may shower after you go home. If you have an incision, do not soak in a bathtub, hot tub, or swim until the incision heals completely.  Incision Care  Shower every day. Clean your incision with mild soap and water. Pat the area dry with a clean towel. You do not need a bandage unless otherwise instructed. Do not apply any ointments or creams to your incision. If you clothing is irritating, you may cover your incision with a dry gauze pad.  Diet  Resume your normal diet. There are no special food restrictions following this procedure. A low fat/low cholesterol diet is recommended for all patients with vascular disease. In order to heal from your surgery, it is CRITICAL to get adequate nutrition. Your body requires vitamins, minerals, and protein. Vegetables are the best source of vitamins and minerals. Vegetables also provide the perfect balance of protein. Processed food has little nutritional value, so try to avoid this.  Medications  Resume taking all of your medications unless your doctor or Physician Assistant tells  you not to. If your incision is causing pain, you may take over-the-counter pain relievers such as acetaminophen  (Tylenol ). If you were prescribed a stronger pain medication, please be aware these medications can cause nausea and constipation. Prevent nausea by taking the medication with a snack or meal. Avoid constipation by drinking plenty of fluids and eating foods with a high amount of fiber, such as fruits, vegetables, and grains. Do not take Tylenol  if you are taking prescription pain medications.   Follow up  Our office will schedule a follow-up appointment with a C.T. scan 4 weeks after your surgery.  Please call us  immediately for any of the following conditions  Severe or worsening pain in your legs or feet or in your abdomen back or chest. Increased pain, redness, drainage (pus) from your incision sit. Increased abdominal pain, bloating, nausea, vomiting or persistent diarrhea. Fever of 101 degrees or higher. Swelling in your leg (s),  Reduce your risk of vascular disease  Stop smoking. If you would like help call [facility] at 1-800-QUIT-NOW (3126130640) or [place] at [phone]. Manage your cholesterol Maintain a desired weight Control your diabetes Keep your blood pressure down  If you have questions, please call the office at [phone].   Disposition: Inpatient Rehab  Patient's condition: is Good  Follow up: 1. Dr. Lanis in 4 weeks with CTA protocol   Ahmed Holster, PA-C [facility] [phone] 03/23/2024  3:15 PM   - For [facility]QI Registry use - Post-op:  Time to Extubation: [x]  In OR, [ ]  < 12 hrs, [ ]  12-24 hrs, [ ]  >=24 hrs Vasopressors Req. Post-op: No MI: No., [ ]  Troponin only, [ ]  EKG or Clinical New Arrhythmia: No CHF: No  ICU Stay: 3 days Transfusion: No     If yes,  units given  Complications: Resp failure: No., [ ]  Pneumonia, [ ]  Ventilator Chg in renal function: No., [ ]  Inc. Cr > 0.5, [ ]  Temp. Dialysis,  [ ]   Permanent dialysis Leg ischemia: No., no Surgery needed, [ ]  Yes, Surgery needed,  [ ]  Amputation Bowel ischemia: No., [ ]  Medical Rx, [ ]  Surgical Rx Wound complication: No., [ ]  Superficial separation/infection, [ ]  Return to OR Return to OR: No  Return to OR for bleeding: No Stroke: Yes.  , [x ] Minor, [ ]  Major  Discharge medications: Statin use:  Yes  ASA use:  Yes  Plavix use:  No  Beta blocker use:  Yes  ARB use:  No ACEI use:  No CCB use:  No Other anticoagulation: Yes, Pradaxa 

## 2024-03-23 NOTE — Progress Notes (Signed)
 Terri Joesph BROCKS, Terri Alexander  Physician Physical Medicine and Rehabilitation   PMR Pre-admission    Signed   Date of Service: 03/23/2024 11:49 AM  Related encounter: Admission (Current) from 03/19/2024 in [facility] 4E CV SURGICAL PROGRESSIVE CARE   Signed     Expand All Collapse All  PMR Admission Coordinator Pre-Admission Assessment   Patient: Terri Alexander is an 73 y.o., female MRN: [id number] DOB: 09/14/1950 Height: 5' 2 (157.5 cm) Weight: 63.5 kg   Insurance Information HMO:     PPO:      PCP:      IPA:      80/20:      OTHER:  PRIMARY: Medicare Part A and B      Policy#: [id number]       Subscriber:  CM Name:       Phone#:      Fax#:  Pre-Cert#: verified Health And Safety Inspector:  Benefits:  Phone #:      Name:  Eff. Date: A/B 04/17/2015     Deduct: $1676      Out of Pocket Max: n/a      Life Max: n/a CIR: 100%      SNF: 20 full days Outpatient: 80%     Co-Pay: 20% Home Health: 100%      Co-Pay:  DME: 80%     Co-Pay: 20% Providers:  SECONDARY: UHC PPO      Policy#: [id number]     Phone#: [phone]   Financial Counselor:       Phone#:    The "Data Collection Information Summary" for patients in Inpatient Rehabilitation Facilities with attached "Privacy Act Statement-Health Care Records" was provided and verbally reviewed with: Patient and Family   Emergency Contact Information Contact Information       Name Relation Home Work Mobile    Katona,Dennis Spouse [phone]   [phone]    [place],Devin Son     [phone]         Other Contacts   None on File        Current Medical History  Patient Admitting Diagnosis: CVA following TEVAR   History of Present Illness: Pt is a 73 y/o female with PMH of afib s/p ablation, osteoporosis, skin cancer, and PVD with type B aortic dissection  with aneurysmal dilation of the descending aorta who was admitted to [facility] on 11/3 for a planned TEVAR with Dr. Lanis.  POD 2 pt developed dizziness, nausea, and unsteadiness with  gait.  Rapid response initiated and recommended sleep/wake cycle and avoiding sedating medications.  Due to persistent symptoms an MRI was completed and revealed small, punctate infarcts in bilateral frontal lobes and left occipital/cerebellar.  Neurology was consulted and workup completed with recommendations for continued pradaxa  and asa.  Therapy evaluations were completed and pt was recommended for CIR.    Complete NIHSS TOTAL: 0   Patient's medical record from [name] has been reviewed by the rehabilitation admission coordinator and physician.   Past Medical History      Past Medical History:  Diagnosis Date   Adenomatous colon polyp     Allergy     Anemia      as a child, no current problem   Arrhythmia     Atrial fibrillation (HCC)     Blood transfusion without reported diagnosis @ 9 months    9 months old   Cataract      bilateral - MD just watching  Dysrhythmia      A. Fib   GERD (gastroesophageal reflux disease)     History of hiatal hernia     Hyperlipidemia     Osteoporosis 04/15/2016   Peripheral vascular disease      Type B Aortic Dissection   Skin cancer 2013    Skin cancer- Nose, face          Has the patient had major surgery during 100 days prior to admission? Yes   Family History   family history includes Arrhythmia in her maternal aunt and mother; Arthritis in her mother; Cancer in her maternal grandfather and maternal grandmother; Depression in her sister and sister; Diabetes in an other family member; Early death in her maternal grandfather and paternal grandfather; Heart disease in her maternal grandfather, mother, and paternal grandfather; Stroke in her mother; Transient ischemic attack (age of onset: 40) in her sister; Vision loss in her mother.   Current Medications  Current Medications    Current Facility-Administered Medications:    acetaminophen  (TYLENOL ) tablet 325-650 mg, 325-650 mg, Oral, Q4H PRN, 650 mg at 03/22/24 2049 **OR**  acetaminophen  (TYLENOL ) suppository 325-650 mg, 325-650 mg, Rectal, Q4H PRN, Eveland, Matthew, PA-C   aspirin EC tablet 81 mg, 81 mg, Oral, Q0600, Bethanie Cough, PA-C, 81 mg at 03/23/24 0500   bisacodyl (DULCOLAX) EC tablet 5 mg, 5 mg, Oral, Daily PRN, Eveland, Matthew, PA-C   chlorhexidine  (PERIDEX ) 0.12 % solution 15 mL, 15 mL, Mouth/Throat, Once, Eveland, Matthew, PA-C   cyclobenzaprine  (FLEXERIL ) tablet 10 mg, 10 mg, Oral, TID PRN, Eveland, Matthew, PA-C, 10 mg at 03/23/24 0500   dabigatran  (PRADAXA ) capsule 150 mg, 150 mg, Oral, Q12H, Eveland, Matthew, PA-C, 150 mg at 03/23/24 0820   docusate sodium  (COLACE) capsule 100 mg, 100 mg, Oral, q AM, Eveland, Matthew, PA-C, 100 mg at 03/23/24 9178   famotidine  (PEPCID ) tablet 20 mg, 20 mg, Oral, Daily PRN, Eveland, Matthew, PA-C, 20 mg at 03/19/24 2226   metoprolol  succinate (TOPROL -XL) 24 hr tablet 12.5 mg, 12.5 mg, Oral, Daily, Eveland, Matthew, PA-C, 12.5 mg at 03/23/24 0820   metoprolol  tartrate (LOPRESSOR ) injection 5 mg, 5 mg, Intravenous, Q2H PRN, Pearline Norman RAMAN, MD   Oral care mouth rinse, 15 mL, Mouth Rinse, PRN, Eveland, Matthew, PA-C   oxyCODONE (Oxy IR/ROXICODONE) immediate release tablet 5-10 mg, 5-10 mg, Oral, Q4H PRN, Eveland, Matthew, PA-C, 10 mg at 03/22/24 1908   pantoprazole (PROTONIX) EC tablet 40 mg, 40 mg, Oral, Daily, Eveland, Matthew, PA-C, 40 mg at 03/23/24 0820   pantoprazole (PROTONIX) EC tablet 40 mg, 40 mg, Oral, Daily PRN, Eveland, Matthew, PA-C   phenol (CHLORASEPTIC) mouth spray 1 spray, 1 spray, Mouth/Throat, PRN, Eveland, Matthew, PA-C   polyethylene glycol (MIRALAX  / GLYCOLAX ) packet 17 g, 17 g, Oral, Daily PRN, Eveland, Matthew, PA-C   potassium chloride  SA (KLOR-CON  M) CR tablet 40-60 mEq, 40-60 mEq, Oral, Daily PRN, Eveland, Matthew, PA-C     Patients Current Diet:  Diet Order                  Diet - low sodium heart healthy             Diet regular Room service appropriate? Yes; Fluid consistency:  Thin  Diet effective now                         Precautions / Restrictions Precautions Precautions: Fall Restrictions Weight Bearing Restrictions Per Provider Order: No  Has the patient had 2 or more falls or a fall with injury in the past year? No   Prior Activity Level Community (5-7x/wk): indep, no DME prior to admit, driving   Prior Functional Level Self Care: Did the patient need help bathing, dressing, using the toilet or eating? Independent   Indoor Mobility: Did the patient need assistance with walking from room to room (with or without device)? Independent   Stairs: Did the patient need assistance with internal or external stairs (with or without device)? Independent   Functional Cognition: Did the patient need help planning regular tasks such as shopping or remembering to take medications? Independent   Patient Information Are you of Hispanic, Latino/a,or Spanish origin?: A. No, not of Hispanic, Latino/a, or Spanish origin What is your race?: A. White Terri Alexander you need or want an interpreter to communicate with a doctor or health care staff?: 0. No   Patient's Response To:  Health Literacy and Transportation Is the patient able to respond to health literacy and transportation needs?: Yes Health Literacy - How often Terri Alexander you need to have someone help you when you read instructions, pamphlets, or other written material from your doctor or pharmacy?: Never In the past 12 months, has lack of transportation kept you from medical appointments or from getting medications?: No In the past 12 months, has lack of transportation kept you from meetings, work, or from getting things needed for daily living?: No   Journalist, Newspaper / Equipment Home Equipment: Shower seat - built in, Grab bars - tub/shower, Hand held shower head   Prior Device Use: Indicate devices/aids used by the patient prior to current illness, exacerbation or injury? None of the above   Current  Functional Level Cognition   Orientation Level: Oriented X4    Extremity Assessment (includes Sensation/Coordination)   Upper Extremity Assessment: Overall WFL for tasks assessed, Right hand dominant (did have a little trouble with alternating finger to nose)  Lower Extremity Assessment: LLE deficits/detail LLE Deficits / Details: grossly 4-/5 LLE Coordination: decreased gross motor, decreased fine motor     ADLs   Overall ADL's : Needs assistance/impaired Eating/Feeding: Independent, Sitting Grooming: Minimal assistance, Wash/dry hands Grooming Details (indicate cue type and reason): for balance standing (posterior lean) Upper Body Bathing: Set up, Sitting Lower Body Bathing: Minimal assistance Lower Body Bathing Details (indicate cue type and reason): for balance in standing and for sit<>stand Upper Body Dressing : Set up, Sitting Lower Body Dressing: Minimal assistance Lower Body Dressing Details (indicate cue type and reason): for balance in standing and for sit<>stand Toilet Transfer: Minimal assistance, Ambulation, Regular Toilet, Grab bars Toilet Transfer Details (indicate cue type and reason): for balance in standing and for sit<>stand Toileting- Clothing Manipulation and Hygiene: Minimal assistance Toileting - Clothing Manipulation Details (indicate cue type and reason): for balance in standing and for sit<>stand General ADL Comments: Did dicuss that a grab bar at door with one step would help with safety up and down that one step     Mobility   Overal bed mobility: Independent General bed mobility comments: pt up in recliner     Transfers   Overall transfer level: Needs assistance Equipment used: Rolling walker (2 wheels) Transfers: Sit to/from Stand Sit to Stand: Min assist General transfer comment: Pt requiring assist due to poor awareness of posture upon standing with pt losing balance posteriorly. Poor coordination of trunk and left LE.     Ambulation / Gait /  Stairs / Psychologist, [employer]  Ambulation/Gait Ambulation/Gait assistance: Mod assist Gait Distance (Feet): 65 Feet Assistive device: None, Rolling walker (2 wheels) Gait Pattern/deviations: Decreased step length - left, Decreased dorsiflexion - left, Decreased stance time - left, Knee flexed in stance - left, Leaning posteriorly, Staggering right, Staggering left, Trunk flexed, Ataxic General Gait Details: Pt needs mod assist to ambulate without device with LOB all directions due to poor coordination of trunk and left LE.  Does better with RW although still sone unsteadiness due to left LE decr dorsiflexion which worsens as she walks further. Gait velocity interpretation: <1.31 ft/sec, indicative of household ambulator     Posture / Balance Balance Overall balance assessment: Needs assistance Sitting-balance support: No upper extremity supported, Feet supported Sitting balance-Leahy Scale: Good Standing balance support: No upper extremity supported, During functional activity Standing balance-Leahy Scale: Poor Standing balance comment: posterior lean in standng and with ambulation intermittently     Special considerations/life events  Skin surgical incisions to bilat groins and LUE and Diabetic management A1C 5.1    Previous Home Environment (from acute therapy documentation) Living Arrangements: Spouse/significant other Available Help at Discharge: Family, Available 24 hours/day Type of Home: House Home Layout: One level Home Access: Stairs to enter [employer] of Steps: 1 Bathroom Shower/Tub: Psychologist, counselling, Engineer, Building Services: Handicapped height Home Care Services: No   Discharge Living Setting Plans for Discharge Living Setting: Patient's home, Lives with (comment) (spouse) Type of Home at Discharge: House Discharge Home Layout: One level Discharge Home Access: Stairs to enter Entrance Stairs-Rails: None Entrance Stairs-Number of Steps: 1 Discharge Bathroom  Shower/Tub: Tub/shower unit, Walk-in shower Discharge Bathroom Toilet: Handicapped height Discharge Bathroom Accessibility: Yes How Accessible: Accessible via walker Does the patient have any problems obtaining your medications?: No   Social/Family/Support Systems Patient Roles: Spouse Anticipated Caregiver: spouse, [name] Anticipated Industrial/product Designer Information: [phone] Ability/Limitations of Caregiver: none stated Caregiver Availability: 24/7 Discharge Plan Discussed with Primary Caregiver: Yes Is Caregiver In Agreement with Plan?: Yes Does Caregiver/Family have Issues with Lodging/Transportation while Pt is in Rehab?: No   Goals Patient/Family Goal for Rehab: PT/OT supervision to mod I, SLP n/a Expected length of stay: 10-12 days Additional Information: Discharge plan: home with spouse, supervision/mod I level Pt/Family Agrees to Admission and willing to participate: Yes Program Orientation Provided & Reviewed with Pt/Caregiver Including Roles  & Responsibilities: Yes   Decrease burden of Care through IP rehab admission: n/a   Possible need for SNF placement upon discharge:  Not anticipated.  Expect supervision to mod I level goals with <2 week LOS.  Home with spouse 24/7.    Patient Condition: I have reviewed medical records from [facility], spoken with [facility] team, and patient and spouse. I met with patient at the bedside for inpatient rehabilitation assessment.  Patient will benefit from ongoing PT and OT, can actively participate in 3 hours of therapy a day 5 days of the week, and can make measurable gains during the admission.  Patient will also benefit from the coordinated team approach during an Inpatient Acute Rehabilitation admission.  The patient will receive intensive therapy as well as Rehabilitation physician, nursing, social worker, and care management interventions.  Due to safety, skin/wound care, disease management, medication administration, pain management, and  patient education the patient requires 24 hour a day rehabilitation nursing.  The patient is currently min to mod assist with mobility and basic ADLs.  Discharge setting and therapy post discharge at home with home health is anticipated.  Patient has agreed to participate in  the Acute Inpatient Rehabilitation Program and will admit today.   Preadmission Screen Completed By:  Reche FORBES Lowers, PT, DPT  03/23/2024 11:35 AM ______________________________________________________________________   Discussed status with Dr. Emeline on 03/23/24  at 11:49 AM  and received approval for admission today.   Admission Coordinator:  Carmine Youngberg E Kewon Statler, PT, DPT time  11:49 AM Pattricia 03/23/24     Assessment/Plan: Diagnosis: Multifocal CVAs s/p TEVAR with subclavian branch endoprosthesis  Does the need for close, 24 hr/day Medical supervision in concert with the patient's rehab needs make it unreasonable for this patient to be served in a less intensive setting? Yes Co-Morbidities requiring supervision/potential complications: Tachycardia, weakness, aphasia, anemia, poor pain control, constipation, and aortic dissection s/p TEVAR Due to safety, skin/wound care, disease management, medication administration, pain management, and patient education, does the patient require 24 hr/day rehab nursing? Yes Does the patient require coordinated care of a physician, rehab nurse, PT, OT  to address physical and functional deficits in the context of the above medical diagnosis(es)? Yes Addressing deficits in the following areas: balance, endurance, locomotion, strength, transferring, bathing, dressing, feeding, grooming, toileting Can the patient actively participate in an intensive therapy program of at least 3 hrs of therapy 5 days a week? Yes The potential for patient to make measurable gains while on inpatient rehab is good Anticipated functional outcomes upon discharge from inpatient rehab: supervision PT, supervision  OT, Estimated rehab length of stay to reach the above functional goals is: 10-12 days Anticipated discharge destination: Home 10. Overall Rehab/Functional Prognosis: good     MD Signature:   Joesph JAYSON Emeline, Terri Alexander 03/23/2024           Revision History

## 2024-03-23 NOTE — Progress Notes (Signed)
 Physical Therapy Treatment Patient Details Name: Terri Alexander MRN: [id number] DOB: [birth year] Today's Date: 03/23/2024   History of Present Illness 73 y.o. female admitted 11/3 due to  history of descending thoracic aortic aneurysm extending from zone 3, to zone 4.  Pt is s/p TEVAR with subclavian branch endoprosthesis performed 11/3. 2 days post op pt with episode and rapid response called with suspected CVA.  MRI  demonstrated an embolic event with small, punctate infarcts involving bilateral hemispheres. Symptomatic orthostatic hypotension during PT session 11/7, RN notified. PMH: afib with ablation, cardioversion    PT Comments  Pt received in chair agreeable to work with PTA. Pt orthostatic upon standing and c/o weakness and fatigue with only short household distance gait trial. Recommend trial of BLE compression for next PT session to see if this helps with hemodynamic stability with postural changes. Pt needing up to heavy minA with HHA for gait trial and transfers without RW. Patient will benefit from intensive inpatient follow-up therapy, >3 hours/day.   03/23/24 1250  Vital Signs  Patient Position (if appropriate) Orthostatic Vitals  Orthostatic Sitting  BP- Sitting 118/56 ((73))  Pulse- Sitting 90  Orthostatic Standing at 0 minutes  BP- Standing at 0 minutes 94/44 ((58))  Pulse- Standing at 0 minutes 95  Orthostatic Standing at 3 minutes  BP- Standing at 3 minutes 101/51 ((67) still c/o weakness/fatigue)  Pulse- Standing at 3 minutes 100   Orthostatic Lying   BP- Lying 105/52 (after return to supine from standing)  Pulse- Lying 87     If plan is discharge home, recommend the following: Assistance with cooking/housework;Assist for transportation;A little help with walking and/or transfers;A little help with bathing/dressing/bathroom;Help with stairs or ramp for entrance   Can travel by private vehicle        Equipment Recommendations  Other (comment)     Recommendations for Other Services       Precautions / Restrictions Precautions Precautions: Fall Recall of Precautions/Restrictions: Intact Restrictions Weight Bearing Restrictions Per Provider Order: No     Mobility  Bed Mobility               General bed mobility comments: pt up in recliner    Transfers Overall transfer level: Needs assistance Equipment used: 1 person hand held assist Transfers: Sit to/from Stand Sit to Stand: Min assist                Ambulation/Gait Ambulation/Gait assistance: Contact guard assist, Min assist   Assistive device: 1 person hand held assist Gait Pattern/deviations: Step-to pattern, Decreased stance time - right, Decreased stride length, Decreased step length - left, Decreased dorsiflexion - left           Stairs             Wheelchair Mobility     Tilt Bed    Modified Rankin (Stroke Patients Only) Modified Rankin (Stroke Patients Only) Pre-Morbid Rankin Score: No symptoms Modified Rankin: Moderately severe disability     Balance Overall balance assessment: Needs assistance Sitting-balance support: No upper extremity supported, Feet supported Sitting balance-Leahy Scale: Good     Standing balance support: Single extremity supported, During functional activity Standing balance-Leahy Scale: Fair                              Hotel Manager: No apparent difficulties  Cognition Arousal: Alert Behavior During Therapy: WFL for tasks assessed/performed   PT - Cognitive impairments: Safety/Judgement,  Memory                         Following commands: Intact      Cueing Cueing Techniques: Verbal cues  Exercises      General Comments General comments (skin integrity, edema, etc.): Discussed sit to stand exercises when pt unable to ambulate in room to increase strength in LEs. RN notified that pt could benefit from compression stocking due to pt being  orthostatic.      Pertinent Vitals/Pain Pain Assessment Pain Assessment: 0-10 Pain Score: 7  Faces Pain Scale: Hurts little more Pain Location: Upper back Pain Descriptors / Indicators: Aching Pain Intervention(s): Limited activity within patient's tolerance, Monitored during session    Home Living Family/patient expects to be discharged to:: Private residence Living Arrangements: Spouse/significant other Available Help at Discharge: Family;Available 24 hours/day Type of Home: House Home Access: Stairs to enter   [employer] of Steps: 1   Home Layout: One level Home Equipment: Shower seat - built in;Grab bars - tub/shower;Hand held shower head      Prior Function            PT Goals (current goals can now be found in the care plan section) Acute Rehab PT Goals Patient Stated Goal: to go home PT Goal Formulation: With patient Time For Goal Achievement: 04/05/24 Progress towards PT goals: Progressing toward goals    Frequency    Min 3X/week      PT Plan      Co-evaluation              AM-PAC PT 6 Clicks Mobility   Outcome Measure  Help needed turning from your back to your side while in a flat bed without using bedrails?: A Little Help needed moving from lying on your back to sitting on the side of a flat bed without using bedrails?: A Little Help needed moving to and from a bed to a chair (including a wheelchair)?: A Lot Help needed standing up from a chair using your arms (e.g., wheelchair or bedside chair)?: A Lot Help needed to walk in hospital room?: A Lot Help needed climbing 3-5 steps with a railing? : A Lot 6 Click Score: 14    End of Session Equipment Utilized During Treatment: Gait belt Activity Tolerance: Patient limited by fatigue Patient left: in bed;with call bell/phone within reach;with bed alarm set Nurse Communication: Mobility status;Other (comment) (compression stockings, pt orthostatic) PT Visit Diagnosis:  Unsteadiness on feet (R26.81);Muscle weakness (generalized) (M62.81)     Time: 8766-8751 PT Time Calculation (min) (ACUTE ONLY): 15 min  Charges:    $Therapeutic Activity: 8-22 mins PT General Charges $$ ACUTE PT VISIT: 1 Visit                     Johnnie Gaynelle RADDLE    [facility] Keisuke Hollabaugh 03/23/2024, 2:25 PM

## 2024-03-23 NOTE — H&P (Signed)
 Physical Medicine and Rehabilitation Admission H&P     Functional deficits due to CVA    HPI: Terri [place] is a 73 year old female with PMHx atrial fibrillation (on Pradaxa ), HLD, PVD, and cataracts with known hx of type B aortic dissection with aneurysmal degeneration of the descending aorta who presented for scheduled TEVAR on 03/19/2024 by Dr. Lanis.  The case was complicated by dissection of the left subclavian artery with need for stenting. Of the axillary and brachial arteries to reestablish flow to left upper extremity.  The patient was recovering well and walking with PT until on 11/4 where she had an episode of transient aphasia that lasted a couple of minutes followed by another episode on 11/5 with unsteadiness while walking.  Neurology was consulted and a MRI of the brain was ordered after recurrent episodes and small acute infarcts in the bilateral frontal lobes, left occipital lobe, and left cerebellum were noted.  EF 60-65%, hemoglobin A1c 5.1, LDL 71.  Neurology recommended resuming simvastatin  as patient has tolerated well in the past.  Continue Pradaxa  and aspirin per vascular surgery. Prior to arrival the patient was independent in the community.  Patient currently requires set-up min A for basic ADLs at RW level. Therapy evaluations completed due to patient decreased functional mobility was admitted for a comprehensive rehab program.            Review of Systems  Constitutional:  Negative for chills and fever.  Eyes:  Negative for blurred vision.  Respiratory:  Negative for cough and shortness of breath.   Cardiovascular:  Positive for chest pain and leg swelling. Negative for palpitations.  Gastrointestinal:  Positive for constipation. Negative for nausea and vomiting.  Genitourinary:  Negative for dysuria and urgency.  Musculoskeletal:  Positive for back pain and neck pain.  Neurological:  Positive for focal weakness. Negative for dizziness, sensory change and headaches.   Psychiatric/Behavioral:  Positive for memory loss. Negative for depression. The patient does not have insomnia.           Past Medical History:  Diagnosis Date   Adenomatous colon polyp     Allergy     Anemia      as a child, no current problem   Arrhythmia     Atrial fibrillation (HCC)     Blood transfusion without reported diagnosis @ 9 months    9 months old   Cataract      bilateral - MD just watching    Dysrhythmia      A. Fib   GERD (gastroesophageal reflux disease)     History of hiatal hernia     Hyperlipidemia     Osteoporosis 04/15/2016   Peripheral vascular disease      Type B Aortic Dissection   Skin cancer 2013    Skin cancer- Nose, face             Past Surgical History:  Procedure Laterality Date   ANGIOPLASTY, ARTERY, SUBCLAVIAN, ENDOVASCULAR, WITH STENT INSERTION Left 03/19/2024    Procedure: LEFT AXILLARY ARTERY STENT INSERTION;  Surgeon: [name], MD;  Location: [facility] OR;  Service: Vascular;  Laterality: Left;   ATRIAL FIBRILLATION ABLATION N/A 02/16/2023    Procedure: ATRIAL FIBRILLATION ABLATION;  Surgeon: [name], MD;  Location: [facility] INVASIVE CV LAB;  Service: Cardiovascular;  Laterality: N/A;   CARDIOVERSION N/A 09/09/2022    Procedure: CARDIOVERSION;  Surgeon: [name], MD;  Location: [facility] INVASIVE CV LAB;  Service: Cardiovascular;  Laterality: N/A;   CESAREAN SECTION   1981    x 1   CESAREAN SECTION       COLONOSCOPY   2016    hx polyp, tics, sigmoid colon mod tortuous Dr [name],    EMBOLECTOMY Left 03/19/2024    Procedure: EMBOLECTOMY OF LEFT BRACHIAL ARTERY;  Surgeon: [name], MD;  Location: [facility] OR;  Service: Vascular;  Laterality: Left;   EYE SURGERY Bilateral      retina repair x 2   FEMORAL ARTERY EXPLORATION Left 03/19/2024    Procedure: EXPOSURE OF BRACHIAL ARTERY WITH STENT INSERTION;  Surgeon: [name], MD;  Location: [facility] OR;  Service: Vascular;  Laterality: Left;   LAPAROTOMY   1979    removal of  ovarian cyst   nissan fundiplication   05/17/1997   REPAIR OF COMPLEX TRACTION RETINAL DETACHMENT Bilateral 01/2019   SVT ABLATION N/A 12/09/2023    Procedure: SVT ABLATION;  Surgeon: [name], MD;  Location: [facility] INVASIVE CV LAB;  Service: Cardiovascular;  Laterality: N/A;   THORACIC AORTIC ENDOVASCULAR STENT GRAFT Left 03/19/2024    Procedure: INSERTION, ENDOVASCULAR STENT GRAFT, AORTA, THORACIC; STENTING X2 OF LEFT SUBCLAVIAN ARTERY;  Surgeon: [name], MD;  Location: [facility] OR;  Service: Vascular;  Laterality: Left;   ULTRASOUND GUIDANCE FOR VASCULAR ACCESS Bilateral 03/19/2024    Procedure: ULTRASOUND GUIDANCE, FOR VASCULAR ACCESS OF BILATERAL FEMORAL AND LEFT RADIAL ARTERY;  Surgeon: [name], MD;  Location: [facility] OR;  Service: Vascular;  Laterality: Bilateral;             Family History  Problem Relation Age of Onset   Arthritis Mother     Heart disease Mother     Vision loss Mother     Stroke Mother     Arrhythmia Mother     Depression Sister     Transient ischemic attack Sister 29   Depression Sister     Cancer Maternal Grandmother     Cancer Maternal Grandfather     Early death Maternal Grandfather     Heart disease Maternal Grandfather     Early death Paternal Grandfather     Heart disease Paternal Grandfather     Diabetes Other          Juvenile   Arrhythmia Maternal Aunt     Colon cancer Neg Hx     Rectal cancer Neg Hx     Prostate cancer Neg Hx          Social History:  reports that she has never smoked. She has never used smokeless tobacco. She reports current alcohol use of about 1.0 standard drink of alcohol per week. She reports that she does not use drugs. Allergies:  Allergies  No Known Allergies         Medications Prior to Admission  Medication Sig Dispense Refill   acetaminophen  (TYLENOL ) 500 MG tablet Take 1,000 mg by mouth every 6 (six) hours as needed for moderate pain (pain score 4-6).       alendronate  (FOSAMAX ) 70 MG tablet  TAKE 1 TABLET BY MOUTH ONCE A WEEK ON AN EMPTY STOMACH WITH A FULL GLASS OF WATER 12 tablet 3   Ascorbic Acid (VITAMIN C) 1000 MG tablet Take 1,000 mg by mouth in the morning.       Biotin 1000 MCG tablet Take 1,000 mcg by mouth daily.       Calcium Carbonate-Vit D-Min (CALCIUM 1200 PO) Take 1 tablet by mouth in  the morning.       Calcium Polycarbophil (FIBER-CAPS PO) Take 1 capsule by mouth in the morning.       cetirizine (ZYRTEC) 10 MG tablet Take 10 mg by mouth daily.       Cholecalciferol (VITAMIN D3) 125 MCG (5000 UT) TABS Take 5,000 Units by mouth in the morning.       Cranberry 500 MG CHEW Chew 500 mg by mouth in the morning.       cyclobenzaprine  (FLEXERIL ) 10 MG tablet Take 1 tablet (10 mg total) by mouth 3 (three) times daily as needed for muscle spasms. 30 tablet 1   docusate sodium  (COLACE) 100 MG capsule Take 100 mg by mouth in the morning.       famotidine  (PEPCID ) 20 MG tablet Take 20 mg by mouth daily as needed for heartburn or indigestion.       furosemide  (LASIX ) 40 MG tablet Take 1 tablet (40 mg total) by mouth as needed for edema. 90 tablet 0   L-Lysine 500 MG CAPS Take 500 mg by mouth in the morning.       Lidocaine  (SALONPAS PAIN RELIEVING EX) Apply 1 Application topically daily as needed (pain).       Menthol-Methyl Salicylate (SALONPAS PAIN RELIEF PATCH EX) Apply 1 patch topically daily as needed (pain).       metoprolol  succinate (TOPROL  XL) 25 MG 24 hr tablet Take 0.5 tablets (12.5 mg total) by mouth daily. 45 tablet 3   Multiple Vitamin (MULTIVITAMIN WITH MINERALS) TABS tablet Take 1 tablet by mouth in the morning.       omeprazole (PRILOSEC OTC) 20 MG tablet Take 20 mg by mouth daily as needed (acid reflux).       Probiotic Product (PROBIOTIC PO) Take 1 tablet by mouth in the morning.       dabigatran  (PRADAXA ) 150 MG CAPS capsule Take 1 capsule (150 mg total) by mouth 2 (two) times daily. 180 capsule 3              Home: Home Living Family/patient expects to  be discharged to:: Private residence Living Arrangements: Spouse/significant other Available Help at Discharge: Family, Available 24 hours/day Type of Home: House Home Access: Stairs to enter [employer] of Steps: 1 Home Layout: One level Bathroom Shower/Tub: Psychologist, counselling, Engineer, Building Services: Handicapped height Home Equipment: Information systems manager - built in, [employer] [facility] - tub/shower, Hand held shower head   Functional History: Prior Function Prior Level of Function : Driving, Independent/Modified Independent Mobility Comments: I PTA no device ADLs Comments: took tub baths per pt, I B/D PTA   Functional Status:  Mobility: Bed Mobility Overal bed mobility: Independent General bed mobility comments: pt up in recliner Transfers Overall transfer level: Needs assistance Equipment used: Rolling walker (2 wheels) Transfers: Sit to/from Stand Sit to Stand: Min assist General transfer comment: Pt requiring assist due to poor awareness of posture upon standing with pt losing balance posteriorly. Poor coordination of trunk and left LE. Ambulation/Gait Ambulation/Gait assistance: Mod assist Gait Distance (Feet): 65 Feet Assistive device: None, Rolling walker (2 wheels) Gait Pattern/deviations: Decreased step length - left, Decreased dorsiflexion - left, Decreased stance time - left, Knee flexed in stance - left, Leaning posteriorly, Staggering right, Staggering left, Trunk flexed, Ataxic General Gait Details: Pt needs mod assist to ambulate without device with LOB all directions due to poor coordination of trunk and left LE.  Does better with RW although still sone unsteadiness due to left LE decr  dorsiflexion which worsens as she walks further. Gait velocity interpretation: <1.31 ft/sec, indicative of household ambulator   ADL: ADL Overall ADL's : Needs assistance/impaired Eating/Feeding: Independent, Sitting Grooming: Minimal assistance, Wash/dry hands Grooming Details (indicate  cue type and reason): for balance standing (posterior lean) Upper Body Bathing: Set up, Sitting Lower Body Bathing: Minimal assistance Lower Body Bathing Details (indicate cue type and reason): for balance in standing and for sit<>stand Upper Body Dressing : Set up, Sitting Lower Body Dressing: Minimal assistance Lower Body Dressing Details (indicate cue type and reason): for balance in standing and for sit<>stand Toilet Transfer: Minimal assistance, Ambulation, Regular Toilet, Grab bars Toilet Transfer Details (indicate cue type and reason): for balance in standing and for sit<>stand Toileting- Clothing Manipulation and Hygiene: Minimal assistance Toileting - Clothing Manipulation Details (indicate cue type and reason): for balance in standing and for sit<>stand General ADL Comments: Did dicuss that a grab bar at door with one step would help with safety up and down that one step   Cognition: Cognition Orientation Level: Oriented X4 Cognition Arousal: Alert Behavior During Therapy: WFL for tasks assessed/performed   Physical Exam: Blood pressure (!) 108/42, pulse 88, temperature (!) 97.4 F (36.3 C), temperature source Oral, resp. rate 20, height 5' 2 (1.575 m), weight 63.5 kg, SpO2 99%. Physical Exam   Constitutional: No apparent distress. Appropriate appearance for age.  Laying in bed. HENT: No JVD. Neck Supple. Trachea midline. Atraumatic, normocephalic.   Eyes: PERRLA. EOMI. Visual fields grossly intact.  Glasses donned Cardiovascular: RRR, no murmurs/rub/gallops.  1+ bilateral edema. Peripheral pulses 2+  Respiratory: CTAB. No rales, rhonchi, or wheezing. On RA.  Abdomen: + bowel sounds, normoactive. No distention or tenderness.  Skin:  Diffuse bruising along left proximal arm, tender with some edema.  Distal access site dermabonded, intact. Right hand IV intact  MSK:      No apparent deformity.       Neurologic exam:  Cognition: AAO to person, place, time and event.  +  Cannot add change, cannot spell world backwards, some mild delay in more complex cognitive tasks Language: Mild dysarthria. Names 2/3 objects correctly.  Memory: Recalls 1/3 objects at 5 minutes, 3 of 3 with cues. Insight: Good insight into current condition.  Mood: Pleasant affect, appropriate mood.  Sensation: To light touch intact in BL UEs and LEs   Reflexes: 2+ in BL UE and LEs. Negative Hoffman's and babinski signs bilaterally.   CN: Mild left facial droop, left tongue deviation Coordination: No apparent tremors. No ataxia on FTN, HTS bilaterally.  Spasticity: MAS 0 in all extremities.       Strength:                RUE: 5-/5 SA, 5-/5 EF, 5-/5 EE, 5-/5 WE, 5/5 FF, 5/5 FA                LUE:  5-/5 SA, 5-/5 EF, 5-/5 EE, 5-/5 WE, 5/5 FF, 5/5 FA                RLE: 5/5 HF, 5/5 KE, 5/5  DF, 5/5  EHL, 5/5  PF                 LLE:  4+/5 HF, 4+/5 KE, 5-/5  DF, 5-/5  EHL, 5-/5  PF     Lab Results Last 48 Hours        Results for orders placed or performed during the hospital encounter of 03/19/24 (from the past 48  hours)  Glucose, capillary     Status: Abnormal    Collection Time: 03/21/24  5:08 PM  Result Value Ref Range    Glucose-Capillary 165 (H) 70 - 99 mg/dL      Comment: Glucose reference range applies only to samples taken after fasting for at least 8 hours.  Basic metabolic panel     Status: Abnormal    Collection Time: 03/22/24 10:41 AM  Result Value Ref Range    Sodium 136 135 - 145 mmol/L    Potassium 3.8 3.5 - 5.1 mmol/L    Chloride 106 98 - 111 mmol/L    CO2 21 (L) 22 - 32 mmol/L    Glucose, Bld 168 (H) 70 - 99 mg/dL      Comment: Glucose reference range applies only to samples taken after fasting for at least 8 hours.    BUN 7 (L) 8 - 23 mg/dL    Creatinine, Ser 9.32 0.44 - 1.00 mg/dL    Calcium 7.9 (L) 8.9 - 10.3 mg/dL    GFR, Estimated >39 >39 mL/min      Comment: (NOTE) Calculated using the CKD-EPI Creatinine Equation (2021)      Anion gap 9 5 - 15       Comment: Performed at [facility], [address], [place], [place] [address]  CBC     Status: Abnormal    Collection Time: 03/22/24 10:41 AM  Result Value Ref Range    WBC 13.4 (H) 4.0 - 10.5 K/uL    RBC 2.78 (L) 3.87 - 5.11 MIL/uL    Hemoglobin 8.5 (L) 12.0 - 15.0 g/dL    HCT 74.8 (L) 63.9 - 46.0 %    MCV 90.3 80.0 - 100.0 fL    MCH 30.6 26.0 - 34.0 pg    MCHC 33.9 30.0 - 36.0 g/dL    RDW 84.8 88.4 - 84.4 %    Platelets 174 150 - 400 K/uL    nRBC 0.0 0.0 - 0.2 %      Comment: Performed at [facility], [address] [place], [place] [address]  Hemoglobin A1c     Status: None    Collection Time: 03/22/24 10:41 AM  Result Value Ref Range    Hgb A1c MFr Bld 5.1 4.8 - 5.6 %      Comment: (NOTE) Diagnosis of Diabetes The following HbA1c ranges recommended by the American Diabetes Association (ADA) may be used as an aid in the diagnosis of diabetes mellitus.   Hemoglobin             Suggested A1C NGSP%              Diagnosis   <5.7                   Non Diabetic   5.7-6.4                Pre-Diabetic   >6.4                   Diabetic   <7.0                   Glycemic control for                       adults with diabetes.        Mean Plasma Glucose 99.67 mg/dL      Comment: Performed at [facility],  [address] [place], [place] [address]  Lipid panel     Status: None    Collection Time: 03/22/24 10:41 AM  Result Value Ref Range    Cholesterol 141 0 - 200 mg/dL    Triglycerides 889 <849 mg/dL    HDL 48 >59 mg/dL    Total CHOL/HDL Ratio 2.9 RATIO    VLDL 22 0 - 40 mg/dL    LDL Cholesterol 71 0 - 99 mg/dL      Comment:        Total Cholesterol/HDL:CHD Risk Coronary Heart Disease Risk Table                     Men   Women  1/2 Average Risk   3.4   3.3  Average Risk       5.0   4.4  2 X Average Risk   9.6   7.1  3 X Average Risk  23.4   11.0        Use the calculated Patient Ratio above and the CHD Risk Table to determine the patient's CHD  Risk.        ATP III CLASSIFICATION (LDL):  <100     mg/dL   Optimal  899-870  mg/dL   Near or Above                    Optimal  130-159  mg/dL   Borderline  839-810  mg/dL   High  >809     mg/dL   Very High Performed at [facility], [address], [place], [place] [address]    Basic metabolic panel     Status: Abnormal    Collection Time: 03/23/24  8:34 AM  Result Value Ref Range    Sodium 136 135 - 145 mmol/L    Potassium 3.7 3.5 - 5.1 mmol/L    Chloride 104 98 - 111 mmol/L    CO2 20 (L) 22 - 32 mmol/L    Glucose, Bld 122 (H) 70 - 99 mg/dL      Comment: Glucose reference range applies only to samples taken after fasting for at least 8 hours.    BUN 11 8 - 23 mg/dL    Creatinine, Ser 9.35 0.44 - 1.00 mg/dL    Calcium 8.1 (L) 8.9 - 10.3 mg/dL    GFR, Estimated >39 >39 mL/min      Comment: (NOTE) Calculated using the CKD-EPI Creatinine Equation (2021)      Anion gap 12 5 - 15      Comment: Performed at [facility], [address] [place], [place] [address]  CBC     Status: Abnormal    Collection Time: 03/23/24 10:43 AM  Result Value Ref Range    WBC 12.9 (H) 4.0 - 10.5 K/uL    RBC 3.12 (L) 3.87 - 5.11 MIL/uL    Hemoglobin 9.5 (L) 12.0 - 15.0 g/dL    HCT 71.5 (L) 63.9 - 46.0 %    MCV 91.0 80.0 - 100.0 fL    MCH 30.4 26.0 - 34.0 pg    MCHC 33.5 30.0 - 36.0 g/dL    RDW 84.6 88.4 - 84.4 %    Platelets 248 150 - 400 K/uL    nRBC 0.0 0.0 - 0.2 %      Comment: Performed at [facility], [address], [place], [place] [address]       Imaging Results (Last  48 hours)  DG Chest Port 1 View Result Date: 03/23/2024 CLINICAL DATA:  Shortness of breath EXAM: PORTABLE CHEST 1 VIEW COMPARISON:  Chest radiograph dated 03/19/2024 FINDINGS: Normal lung volumes. Unchanged dense left retrocardiac opacity. Unchanged trace blunting of the left costophrenic angle. Similar cardiomediastinal silhouette. No acute osseous abnormality. Similar aortic, left common carotid, and  left axillary vascular stent graft. IMPRESSION: 1. Unchanged dense left retrocardiac opacity, likely atelectasis. Aspiration or pneumonia can be considered in the appropriate clinical setting. 2. Unchanged trace left pleural effusion. Electronically Signed   By: Limin  Xu M.D.   On: 03/23/2024 09:10    ECHOCARDIOGRAM COMPLETE Result Date: 03/22/2024    ECHOCARDIOGRAM REPORT   Patient Name:   Terri [place] Date of Exam: 03/22/2024 Medical Rec #:  [id number]      Height:       62.0 in Accession #:    [id number]     Weight:       140.0 lb Date of Birth:  1950-10-14      BSA:          1.643 m Patient Age:    72 years       BP:           118/47 mmHg Patient Gender: F              HR:           90 bpm. Exam Location:  Inpatient Procedure: 2D Echo, Cardiac Doppler and Color Doppler (Both Spectral and Color            Flow Doppler were utilized during procedure). Indications:    Stroke i63.9  History:        Patient has prior history of Echocardiogram examinations, most                 recent 08/18/2022. Arrythmias:Atrial Fibrillation; Risk                 Factors:Dyslipidemia.  Sonographer:    [name] Argyle Referring Phys: [id number] DEVON SHAFER IMPRESSIONS  1. Left ventricular ejection fraction, by estimation, is 60 to 65%. The left ventricle has normal function. The left ventricle has no regional wall motion abnormalities. Left ventricular diastolic parameters were normal.  2. Right ventricular systolic function is normal. The right ventricular size is normal.  3. The mitral valve is normal in structure. Mild mitral valve regurgitation. No evidence of mitral stenosis.  4. The aortic valve is tricuspid. There is mild calcification of the aortic valve. Aortic valve regurgitation is not visualized. Aortic valve sclerosis/calcification is present, without any evidence of aortic stenosis.  5. Aortic dilatation noted. There is borderline dilatation of the ascending aorta, measuring 38 mm.  6. The inferior vena cava is normal in  size with greater than 50% respiratory variability, suggesting right atrial pressure of 3 mmHg. FINDINGS  Left Ventricle: Left ventricular ejection fraction, by estimation, is 60 to 65%. The left ventricle has normal function. The left ventricle has no regional wall motion abnormalities. The left ventricular internal cavity size was normal in size. There is  no left ventricular hypertrophy. Left ventricular diastolic parameters were normal. Right Ventricle: The right ventricular size is normal. No increase in right ventricular wall thickness. Right ventricular systolic function is normal. Left Atrium: Left atrial size was normal in size. Right Atrium: Right atrial size was normal in size. Pericardium: There is no evidence of pericardial effusion. Mitral Valve: The mitral valve is normal in structure. Mild mitral valve  regurgitation. No evidence of mitral valve stenosis. Tricuspid Valve: The tricuspid valve is normal in structure. Tricuspid valve regurgitation is trivial. No evidence of tricuspid stenosis. Aortic Valve: The aortic valve is tricuspid. There is mild calcification of the aortic valve. Aortic valve regurgitation is not visualized. Aortic valve sclerosis/calcification is present, without any evidence of aortic stenosis. Pulmonic Valve: The pulmonic valve was normal in structure. Pulmonic valve regurgitation is not visualized. No evidence of pulmonic stenosis. Aorta: Aortic dilatation noted. There is borderline dilatation of the ascending aorta, measuring 38 mm. Venous: The inferior vena cava is normal in size with greater than 50% respiratory variability, suggesting right atrial pressure of 3 mmHg. IAS/Shunts: No atrial level shunt detected by color flow Doppler.  LEFT VENTRICLE PLAX 2D LVIDd:         4.40 cm   Diastology LVIDs:         3.10 cm   LV e' medial:    8.27 cm/s LV PW:         0.80 cm   LV E/e' medial:  13.8 LV IVS:        0.90 cm   LV e' lateral:   12.70 cm/s LVOT diam:     1.90 cm   LV E/e'  lateral: 9.0 LV SV:         79 LV SV Index:   48 LVOT Area:     2.84 cm  RIGHT VENTRICLE             IVC RV S prime:     12.20 cm/s  IVC diam: 1.30 cm TAPSE (M-mode): 1.9 cm LEFT ATRIUM           Index        RIGHT ATRIUM           Index LA diam:      3.20 cm 1.95 cm/m   RA Area:     12.00 cm LA Vol (A2C): 37.7 ml 22.95 ml/m  RA Volume:   23.30 ml  14.18 ml/m LA Vol (A4C): 32.0 ml 19.48 ml/m  AORTIC VALVE LVOT Vmax:   152.50 cm/s LVOT Vmean:  99.800 cm/s LVOT VTI:    0.277 m  AORTA Ao Root diam: 3.60 cm Ao Asc diam:  3.80 cm MITRAL VALVE MV Area (PHT): 4.06 cm     SHUNTS MV Decel Time: 187 msec     Systemic VTI:  0.28 m MV E velocity: 114.00 cm/s  Systemic Diam: 1.90 cm MV A velocity: 58.20 cm/s MV E/A ratio:  1.96 Toribio Fuel MD Electronically signed by Toribio Fuel MD Signature Date/Time: 03/22/2024/1:10:50 PM    Final     MR ANGIO HEAD WO CONTRAST Result Date: 03/22/2024 CLINICAL DATA:  Follow-up stroke EXAM: MRA HEAD WITHOUT CONTRAST TECHNIQUE: Angiographic images of the Circle of Willis were acquired using MRA technique without intravenous contrast. COMPARISON:  None Available. FINDINGS: MRA intracranial: Right-side: The internal carotid artery is patent without significant stenosis. The middle cerebral artery is patent without significant stenosis or proximal branch occlusion. The anterior cerebral artery is patent without significant stenosis or proximal branch occlusion. No aneurysm. Left side: The internal carotid artery is patent without significant stenosis. The middle cerebral artery is patent without significant stenosis or proximal branch occlusion. The anterior cerebral artery is patent without significant stenosis or proximal branch occlusion. No aneurysm. Posterior circulation: Both vertebral arteries are patent. The basilar artery is patent without significant stenosis. Both posterior cerebral arteries are patent. No aneurysm. Other comments:  None IMPRESSION: Normal Electronically  Signed   By: Nancyann Burns M.D.   On: 03/22/2024 09:21    MR BRAIN WO CONTRAST Result Date: 03/22/2024 EXAM: MRI BRAIN WITHOUT CONTRAST 03/21/2024 08:43:30 PM TECHNIQUE: Multiplanar multisequence MRI of the head/brain was performed without the administration of intravenous contrast. COMPARISON: None available. CLINICAL HISTORY: New onset ataxia. FINDINGS: BRAIN AND VENTRICLES: Small acute infarcts in the right frontal white matter, left occipital lobe, and left cerebllum. Punctate acute infract in the high left frontal lobe (series 5 image 89). No mass. No midline shift. No hydrocephalus. Normal flow voids. ORBITS: No acute abnormality. SINUSES AND MASTOIDS: No acute abnormality. BONES AND SOFT TISSUES: Normal marrow signal. IMPRESSION: 1. Small acute infarcts in the bilateral frontal lobes, left occipital lobe, and left cerebellum. Given involvement of multiple vascular territories, consider embolic etiology. Electronically signed by: Gilmore Molt MD 03/22/2024 12:44 AM EST RP Workstation: [id number]    VAS US  CAROTID Result Date: 03/21/2024 Carotid Arterial Duplex Study Patient Name:  Terri Alexander  Date of Exam:   03/21/2024 Medical Rec #: [id number]       Accession #:    [id number] Date of Birth: 04/08/1951       Patient Gender: F Patient Age:   30 years Exam Location:  [facility] Procedure:      VAS US  CAROTID Referring Phys: FONDA ROBINS --------------------------------------------------------------------------------  Indications:       Carotid artery disease and TEVAR with left subclavian stent.                    Want to ensure adequate carotid and vertebral perfusion. Comparison Study:  No priors. Performing Technologist: Ricka Sturdivant-Jones RDMS, RVT  Examination Guidelines: A complete evaluation includes B-mode imaging, spectral Doppler, color Doppler, and power Doppler as needed of all accessible portions of each vessel. Bilateral testing is considered an integral part of a complete  examination. Limited examinations for reoccurring indications may be performed as noted.  Right Carotid Findings: +----------+--------+--------+--------+------------------+------------------+           PSV cm/sEDV cm/sStenosisPlaque DescriptionComments           +----------+--------+--------+--------+------------------+------------------+ CCA Prox  81      14                                                   +----------+--------+--------+--------+------------------+------------------+ CCA Distal63      20                                intimal thickening +----------+--------+--------+--------+------------------+------------------+ ICA Prox  38      13                                intimal thickening +----------+--------+--------+--------+------------------+------------------+ ICA Mid   141     36                                tortuous           +----------+--------+--------+--------+------------------+------------------+ ICA Distal108     30  tortuous           +----------+--------+--------+--------+------------------+------------------+ ECA       141     22                                                   +----------+--------+--------+--------+------------------+------------------+ +----------+--------+-------+----------------+-------------------+           PSV cm/sEDV cmsDescribe        Arm Pressure (mmHG) +----------+--------+-------+----------------+-------------------+ [id number]            Multiphasic, WNL                    +----------+--------+-------+----------------+-------------------+ +---------+--------+--+--------+--+---------+ VertebralPSV cm/s68EDV cm/s20Antegrade +---------+--------+--+--------+--+---------+ Elevated velocities in the mid ICA due to tortuosity of the vessel. Left Carotid Findings: +----------+--------+--------+--------+------------------+------------------+           PSV  cm/sEDV cm/sStenosisPlaque DescriptionComments           +----------+--------+--------+--------+------------------+------------------+ CCA Prox  111     15                                                   +----------+--------+--------+--------+------------------+------------------+ CCA Distal88      25                                                   +----------+--------+--------+--------+------------------+------------------+ ICA Prox  67      19                                intimal thickening +----------+--------+--------+--------+------------------+------------------+ ICA Mid   83      22                                                   +----------+--------+--------+--------+------------------+------------------+ ICA Distal101     29                                tortuous           +----------+--------+--------+--------+------------------+------------------+ ECA       90      16                                                   +----------+--------+--------+--------+------------------+------------------+ +----------+--------+--------+--------+-------------------+           PSV cm/sEDV cm/sDescribeArm Pressure (mmHG) +----------+--------+--------+--------+-------------------+ Subclavian226                                         +----------+--------+--------+--------+-------------------+ +---------+--------+--+--------+--+---------+ VertebralPSV cm/s71EDV cm/s21Antegrade +---------+--------+--+--------+--+---------+ Patent left subclavian stent  Summary: Right Carotid: The extracranial vessels were near-normal with  only minimal wall                thickening or plaque. Left Carotid: The extracranial vessels were near-normal with only minimal wall               thickening or plaque. Vertebrals:  Bilateral vertebral arteries demonstrate antegrade flow. Subclavians: Normal flow hemodynamics were seen in bilateral subclavian              arteries.  *See table(s) above for measurements and observations.  Electronically signed by Gaile New MD on 03/21/2024 at 5:36:37 PM.    Final            Blood pressure (!) 108/42, pulse 88, temperature (!) 97.4 F (36.3 C), temperature source Oral, resp. rate 20, height 5' 2 (1.575 m), weight 63.5 kg, SpO2 99%.   Medical Problem List and Plan: 1. 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  - Stable to admit to inpatient rehab   2.  Antithrombotics: -DVT/anticoagulation:  Mechanical: Sequential compression devices, below knee Bilateral lower extremities Pharmaceutical: Other (comment) Paradaxa 150 mg BID              -antiplatelet therapy: Aspirin 81 mg    3. Pain Management: On home Flexeril  10 mg. Oxycodone and tylenol  prn    4. Mood/Behavior/Sleep: LCSW to follow for evaluation and support when available.              -antipsychotic agents: n/a   5. Neuropsych/cognition: This patient may be intermittently capable of making decisions on her own behalf.   6. Skin/Wound Care: Routine pressure relief measures.    7. Fluids/Electrolytes/Nutrition: Monitor I&O and weight. Follow up labs CBC/CMP               GERD: Protonix 40 mg daily, using prn Pepcid     8. Status post TEVAR with subclavian branch endoprosthesis: Continue Pradaxa  and Asprin    9. Stroke: MRI  showed Small acute infarcts in the bilateral frontal lobes, left occipital lobe, and left cerebellum. 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  Recommended resuming simvastatin  and follow up with outpatient neurology [facility].   - Simvastatin  10 mg daily resumed on inpatient rehab admission.      10.HLD: Resume Simvastatin  10 mg    11.  Shortness of Breath: Chest x-ray demonstrated a stable small area of atelectasis within the left lung.  O2 sats 96-99% room air             - Incentive spirometer   12.  Tachycardia: Stable, continue Toprol  XL 12.5 mg daily   13.  Constipation.  Last adequate bowel movement prior to admission per patient.  - Has been getting Colace 100 mg twice daily, changed to Senokot-S 1 tab twice daily and daily MiraLAX    Terri LOISE Satterfield, NP 03/23/2024   I have examined the patient independently and edited the note for HPI, ROS, exam, assessment, and plan as appropriate. I am in agreement with the above recommendations.   Terri JAYSON Likes, DO 03/23/2024

## 2024-03-23 NOTE — H&P (Shared)
 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 Lanis.  The case was complicated by dissection of the left subclavian artery with need for stenting. Of the axillary and brachial arteries to reestablish flow to left upper extremity.  The patient was recovering well and walking with PT until on 11/4 where she had an episode of transient aphasia that lasted a couple of minutes followed by another episode on 11/5 with unsteadiness while walking.  Neurology was consulted and a MRI of the brain was ordered after recurrent episodes and small acute infarcts in the bilateral frontal lobes, left occipital lobe, and left cerebellum were noted.  EF 60-65%, hemoglobin A1c 5.1, LDL 71.  Neurology recommended resuming simvastatin  as patient has tolerated well in the past.  Continue Pradaxa  and aspirin per vascular surgery. Prior to arrival the patient was independent in the community.  Patient currently requires set-up min A for basic ADLs at RW level. Therapy evaluations completed due to patient decreased functional mobility was admitted for a comprehensive rehab program.       ROS      Past Medical History:  Diagnosis Date   Adenomatous colon polyp    Allergy    Anemia    as a child, no current problem   Arrhythmia    Atrial fibrillation (HCC)    Blood transfusion without reported diagnosis @ 9 months   9 months old   Cataract    bilateral - MD just watching    Dysrhythmia    A. Fib   GERD (gastroesophageal reflux disease)    History of hiatal hernia    Hyperlipidemia    Osteoporosis 04/15/2016   Peripheral vascular disease    Type B Aortic Dissection   Skin cancer 2013   Skin cancer- Nose,  face   Past Surgical History:  Procedure Laterality Date   ANGIOPLASTY, ARTERY, SUBCLAVIAN, ENDOVASCULAR, WITH STENT INSERTION Left 03/19/2024   Procedure: LEFT AXILLARY ARTERY STENT INSERTION;  Surgeon: [name], MD;  Location: [facility] OR;  Service: Vascular;  Laterality: Left;   ATRIAL FIBRILLATION ABLATION N/A 02/16/2023   Procedure: ATRIAL FIBRILLATION ABLATION;  Surgeon: [name], MD;  Location: [facility] INVASIVE CV LAB;  Service: Cardiovascular;  Laterality: N/A;   CARDIOVERSION N/A 09/09/2022   Procedure: CARDIOVERSION;  Surgeon: [name], MD;  Location: [facility] INVASIVE CV LAB;  Service: Cardiovascular;  Laterality: N/A;   CESAREAN SECTION  1981   x 1   CESAREAN SECTION     COLONOSCOPY  2016   hx polyp, tics, sigmoid colon mod tortuous Dr [name],    EMBOLECTOMY Left 03/19/2024   Procedure: EMBOLECTOMY OF LEFT BRACHIAL ARTERY;  Surgeon: [name], MD;  Location: [facility] OR;  Service: Vascular;  Laterality: Left;   EYE SURGERY Bilateral    retina repair x 2   FEMORAL ARTERY EXPLORATION Left 03/19/2024   Procedure: EXPOSURE OF BRACHIAL ARTERY WITH STENT INSERTION;  Surgeon: [name], MD;  Location: [facility] OR;  Service: Vascular;  Laterality: Left;   LAPAROTOMY  1979   removal of ovarian cyst   nissan fundiplication  05/17/1997   REPAIR OF COMPLEX TRACTION RETINAL DETACHMENT Bilateral 01/2019   SVT ABLATION  N/A 12/09/2023   Procedure: SVT ABLATION;  Surgeon: [name], MD;  Location: [facility];  Service: Cardiovascular;  Laterality: N/A;   THORACIC AORTIC ENDOVASCULAR STENT GRAFT Left 03/19/2024   Procedure: INSERTION, ENDOVASCULAR STENT GRAFT, AORTA, THORACIC; STENTING X2 OF LEFT SUBCLAVIAN ARTERY;  Surgeon: [name], MD;  Location[facility] OR;  Service: Vascular;  Laterality: Left;   ULTRASOUND GUIDANCE FOR VASCULAR ACCESS Bilateral 03/19/2024   Procedure: ULTRASOUND GUIDANCE, FOR VASCULAR ACCESS OF BILATERAL FEMORAL AND LEFT RADIAL ARTERY;  Surgeon:  [name], MD;  Location: [facility] OR;  Service: Vascular;  Laterality: Bilateral;   Family History  Problem Relation Age of Onset   Arthritis Mother    Heart disease Mother    Vision loss Mother    Stroke Mother    Arrhythmia Mother    Depression Sister    Transient ischemic attack Sister 26   Depression Sister    Cancer Maternal Grandmother    Cancer Maternal Grandfather    Early death Maternal Grandfather    Heart disease Maternal Grandfather    Early death Paternal Grandfather    Heart disease Paternal Grandfather    Diabetes Other        Juvenile   Arrhythmia Maternal Aunt    Colon cancer Neg Hx    Rectal cancer Neg Hx    Prostate cancer Neg Hx    Social History:  reports that she has never smoked. She has never used smokeless tobacco. She reports current alcohol use of about 1.0 standard drink of alcohol per week. She reports that she does not use drugs. Allergies: No Known Allergies Medications Prior to Admission  Medication Sig Dispense Refill   acetaminophen  (TYLENOL ) 500 MG tablet Take 1,000 mg by mouth every 6 (six) hours as needed for moderate pain (pain score 4-6).     alendronate  (FOSAMAX ) 70 MG tablet TAKE 1 TABLET BY MOUTH ONCE A WEEK ON AN EMPTY STOMACH WITH A FULL GLASS OF WATER 12 tablet 3   Ascorbic Acid (VITAMIN C) 1000 MG tablet Take 1,000 mg by mouth in the morning.     Biotin 1000 MCG tablet Take 1,000 mcg by mouth daily.     Calcium Carbonate-Vit D-Min (CALCIUM 1200 PO) Take 1 tablet by mouth in the morning.     Calcium Polycarbophil (FIBER-CAPS PO) Take 1 capsule by mouth in the morning.     cetirizine (ZYRTEC) 10 MG tablet Take 10 mg by mouth daily.     Cholecalciferol (VITAMIN D3) 125 MCG (5000 UT) TABS Take 5,000 Units by mouth in the morning.     Cranberry 500 MG CHEW Chew 500 mg by mouth in the morning.     cyclobenzaprine  (FLEXERIL ) 10 MG tablet Take 1 tablet (10 mg total) by mouth 3 (three) times daily as needed for muscle spasms. 30 tablet 1    docusate sodium  (COLACE) 100 MG capsule Take 100 mg by mouth in the morning.     famotidine  (PEPCID ) 20 MG tablet Take 20 mg by mouth daily as needed for heartburn or indigestion.     furosemide  (LASIX ) 40 MG tablet Take 1 tablet (40 mg total) by mouth as needed for edema. 90 tablet 0   L-Lysine 500 MG CAPS Take 500 mg by mouth in the morning.     Lidocaine  (SALONPAS PAIN RELIEVING EX) Apply 1 Application topically daily as needed (pain).     Menthol-Methyl Salicylate (SALONPAS PAIN RELIEF PATCH EX) Apply 1 patch topically daily as  needed (pain).     metoprolol  succinate (TOPROL  XL) 25 MG 24 hr tablet Take 0.5 tablets (12.5 mg total) by mouth daily. 45 tablet 3   Multiple Vitamin (MULTIVITAMIN WITH MINERALS) TABS tablet Take 1 tablet by mouth in the morning.     omeprazole (PRILOSEC OTC) 20 MG tablet Take 20 mg by mouth daily as needed (acid reflux).     Probiotic Product (PROBIOTIC PO) Take 1 tablet by mouth in the morning.     dabigatran  (PRADAXA ) 150 MG CAPS capsule Take 1 capsule (150 mg total) by mouth 2 (two) times daily. 180 capsule 3      Home: Home Living Family/patient expects to be discharged to:: Private residence Living Arrangements: Spouse/significant other Available Help at Discharge: Family, Available 24 hours/day Type of Home: House Home Access: Stairs to enter [employer] of Steps: 1 Home Layout: One level Bathroom Shower/Tub: Psychologist, counselling, Engineer, Building Services: Handicapped height Home Equipment: Information systems manager - built in, [employer] [facility] - tub/shower, Hand held shower head   Functional History: Prior Function Prior Level of Function : Driving, Independent/Modified Independent Mobility Comments: I PTA no device ADLs Comments: took tub baths per pt, I B/D PTA  Functional Status:  Mobility: Bed Mobility Overal bed mobility: Independent General bed mobility comments: pt up in recliner Transfers Overall transfer level: Needs assistance Equipment used:  Rolling walker (2 wheels) Transfers: Sit to/from Stand Sit to Stand: Min assist General transfer comment: Pt requiring assist due to poor awareness of posture upon standing with pt losing balance posteriorly. Poor coordination of trunk and left LE. Ambulation/Gait Ambulation/Gait assistance: Mod assist Gait Distance (Feet): 65 Feet Assistive device: None, Rolling walker (2 wheels) Gait Pattern/deviations: Decreased step length - left, Decreased dorsiflexion - left, Decreased stance time - left, Knee flexed in stance - left, Leaning posteriorly, Staggering right, Staggering left, Trunk flexed, Ataxic General Gait Details: Pt needs mod assist to ambulate without device with LOB all directions due to poor coordination of trunk and left LE.  Does better with RW although still sone unsteadiness due to left LE decr dorsiflexion which worsens as she walks further. Gait velocity interpretation: <1.31 ft/sec, indicative of household ambulator    ADL: ADL Overall ADL's : Needs assistance/impaired Eating/Feeding: Independent, Sitting Grooming: Minimal assistance, Wash/dry hands Grooming Details (indicate cue type and reason): for balance standing (posterior lean) Upper Body Bathing: Set up, Sitting Lower Body Bathing: Minimal assistance Lower Body Bathing Details (indicate cue type and reason): for balance in standing and for sit<>stand Upper Body Dressing : Set up, Sitting Lower Body Dressing: Minimal assistance Lower Body Dressing Details (indicate cue type and reason): for balance in standing and for sit<>stand Toilet Transfer: Minimal assistance, Ambulation, Regular Toilet, Grab bars Toilet Transfer Details (indicate cue type and reason): for balance in standing and for sit<>stand Toileting- Clothing Manipulation and Hygiene: Minimal assistance Toileting - Clothing Manipulation Details (indicate cue type and reason): for balance in standing and for sit<>stand General ADL Comments: Did dicuss  that a grab bar at door with one step would help with safety up and down that one step  Cognition: Cognition Orientation Level: Oriented X4 Cognition Arousal: Alert Behavior During Therapy: WFL for tasks assessed/performed  Physical Exam: Blood pressure (!) 108/42, pulse 88, temperature (!) 97.4 F (36.3 C), temperature source Oral, resp. rate 20, height 5' 2 (1.575 m), weight 63.5 kg, SpO2 99%. Physical Exam  Results for orders placed or performed during the hospital encounter of 03/19/24 (from the past 48 hours)  Glucose, capillary     Status: Abnormal   Collection Time: 03/21/24  5:08 PM  Result Value Ref Range   Glucose-Capillary 165 (H) 70 - 99 mg/dL    Comment: Glucose reference range applies only to samples taken after fasting for at least 8 hours.  Basic metabolic panel     Status: Abnormal   Collection Time: 03/22/24 10:41 AM  Result Value Ref Range   Sodium 136 135 - 145 mmol/L   Potassium 3.8 3.5 - 5.1 mmol/L   Chloride 106 98 - 111 mmol/L   CO2 21 (L) 22 - 32 mmol/L   Glucose, Bld 168 (H) 70 - 99 mg/dL    Comment: Glucose reference range applies only to samples taken after fasting for at least 8 hours.   BUN 7 (L) 8 - 23 mg/dL   Creatinine, Ser 9.32 0.44 - 1.00 mg/dL   Calcium 7.9 (L) 8.9 - 10.3 mg/dL   GFR, Estimated >39 >39 mL/min    Comment: (NOTE) Calculated using the CKD-EPI Creatinine Equation (2021)    Anion gap 9 5 - 15    Comment: Performed at [facility], [address], [place], [place] [address]  CBC     Status: Abnormal   Collection Time: 03/22/24 10:41 AM  Result Value Ref Range   WBC 13.4 (H) 4.0 - 10.5 K/uL   RBC 2.78 (L) 3.87 - 5.11 MIL/uL   Hemoglobin 8.5 (L) 12.0 - 15.0 g/dL   HCT 74.8 (L) 63.9 - 53.9 %   MCV 90.3 80.0 - 100.0 fL   MCH 30.6 26.0 - 34.0 pg   MCHC 33.9 30.0 - 36.0 g/dL   RDW 84.8 88.4 - 84.4 %   Platelets 174 150 - 400 K/uL   nRBC 0.0 0.0 - 0.2 %    Comment: Performed at [facility], [address],  [place], [place] [address]  Hemoglobin A1c     Status: None   Collection Time: 03/22/24 10:41 AM  Result Value Ref Range   Hgb A1c MFr Bld 5.1 4.8 - 5.6 %    Comment: (NOTE) Diagnosis of Diabetes The following HbA1c ranges recommended by the American Diabetes Association (ADA) may be used as an aid in the diagnosis of diabetes mellitus.  Hemoglobin             Suggested A1C NGSP%              Diagnosis  <5.7                   Non Diabetic  5.7-6.4                Pre-Diabetic  >6.4                   Diabetic  <7.0                   Glycemic control for                       adults with diabetes.     Mean Plasma Glucose 99.67 mg/dL    Comment: Performed at [facility], [address], [place], [place] [address]  Lipid panel     Status: None   Collection Time: 03/22/24 10:41 AM  Result Value Ref Range   Cholesterol 141 0 - 200 mg/dL   Triglycerides 889 <849 mg/dL   HDL 48 >59 mg/dL   Total  CHOL/HDL Ratio 2.9 RATIO   VLDL 22 0 - 40 mg/dL   LDL Cholesterol 71 0 - 99 mg/dL    Comment:        Total Cholesterol/HDL:CHD Risk Coronary Heart Disease Risk Table                     Men   Women  1/2 Average Risk   3.4   3.3  Average Risk       5.0   4.4  2 X Average Risk   9.6   7.1  3 X Average Risk  23.4   11.0        Use the calculated Patient Ratio above and the CHD Risk Table to determine the patient's CHD Risk.        ATP III CLASSIFICATION (LDL):  <100     mg/dL   Optimal  899-870  mg/dL   Near or Above                    Optimal  130-159  mg/dL   Borderline  839-810  mg/dL   High  >809     mg/dL   Very High Performed at [facility], [address], [place], [place] [address]   Basic metabolic panel     Status: Abnormal   Collection Time: 03/23/24  8:34 AM  Result Value Ref Range   Sodium 136 135 - 145 mmol/L   Potassium 3.7 3.5 - 5.1 mmol/L   Chloride 104 98 - 111 mmol/L   CO2 20 (L) 22 - 32 mmol/L   Glucose, Bld 122 (H) 70 - 99 mg/dL    Comment:  Glucose reference range applies only to samples taken after fasting for at least 8 hours.   BUN 11 8 - 23 mg/dL   Creatinine, Ser 9.35 0.44 - 1.00 mg/dL   Calcium 8.1 (L) 8.9 - 10.3 mg/dL   GFR, Estimated >39 >39 mL/min    Comment: (NOTE) Calculated using the CKD-EPI Creatinine Equation (2021)    Anion gap 12 5 - 15    Comment: Performed at [facility], [address], [place], [place] [address]  CBC     Status: Abnormal   Collection Time: 03/23/24 10:43 AM  Result Value Ref Range   WBC 12.9 (H) 4.0 - 10.5 K/uL   RBC 3.12 (L) 3.87 - 5.11 MIL/uL   Hemoglobin 9.5 (L) 12.0 - 15.0 g/dL   HCT 71.5 (L) 63.9 - 53.9 %   MCV 91.0 80.0 - 100.0 fL   MCH 30.4 26.0 - 34.0 pg   MCHC 33.5 30.0 - 36.0 g/dL   RDW 84.6 88.4 - 84.4 %   Platelets 248 150 - 400 K/uL   nRBC 0.0 0.0 - 0.2 %    Comment: Performed at [facility], [address] [place], [place] [address]   DG Chest Port 1 View Result Date: 03/23/2024 CLINICAL DATA:  Shortness of breath EXAM: PORTABLE CHEST 1 VIEW COMPARISON:  Chest radiograph dated 03/19/2024 FINDINGS: Normal lung volumes. Unchanged dense left retrocardiac opacity. Unchanged trace blunting of the left costophrenic angle. Similar cardiomediastinal silhouette. No acute osseous abnormality. Similar aortic, left common carotid, and left axillary vascular stent graft. IMPRESSION: 1. Unchanged dense left retrocardiac opacity, likely atelectasis. Aspiration or pneumonia can be considered in the appropriate clinical setting. 2. Unchanged trace left pleural effusion. Electronically Signed   By: Limin  Xu M.D.   On: 03/23/2024  09:10   ECHOCARDIOGRAM COMPLETE Result Date: 03/22/2024    ECHOCARDIOGRAM REPORT   Patient Name:   JAMACIA JESTER Date of Exam: 03/22/2024 Medical Rec #:  [id number]      Height:       62.0 in Accession #:    [id number]     Weight:       140.0 lb Date of Birth:  1950/11/07      BSA:          1.643 m Patient Age:    72 years       BP:           118/47  mmHg Patient Gender: F              HR:           90 bpm. Exam Location:  Inpatient Procedure: 2D Echo, Cardiac Doppler and Color Doppler (Both Spectral and Color            Flow Doppler were utilized during procedure). Indications:    Stroke i63.9  History:        Patient has prior history of Echocardiogram examinations, most                 recent 08/18/2022. Arrythmias:Atrial Fibrillation; Risk                 Factors:Dyslipidemia.  Sonographer:    [name] Argyle Referring Phys: [id number] DEVON SHAFER IMPRESSIONS  1. Left ventricular ejection fraction, by estimation, is 60 to 65%. The left ventricle has normal function. The left ventricle has no regional wall motion abnormalities. Left ventricular diastolic parameters were normal.  2. Right ventricular systolic function is normal. The right ventricular size is normal.  3. The mitral valve is normal in structure. Mild mitral valve regurgitation. No evidence of mitral stenosis.  4. The aortic valve is tricuspid. There is mild calcification of the aortic valve. Aortic valve regurgitation is not visualized. Aortic valve sclerosis/calcification is present, without any evidence of aortic stenosis.  5. Aortic dilatation noted. There is borderline dilatation of the ascending aorta, measuring 38 mm.  6. The inferior vena cava is normal in size with greater than 50% respiratory variability, suggesting right atrial pressure of 3 mmHg. FINDINGS  Left Ventricle: Left ventricular ejection fraction, by estimation, is 60 to 65%. The left ventricle has normal function. The left ventricle has no regional wall motion abnormalities. The left ventricular internal cavity size was normal in size. There is  no left ventricular hypertrophy. Left ventricular diastolic parameters were normal. Right Ventricle: The right ventricular size is normal. No increase in right ventricular wall thickness. Right ventricular systolic function is normal. Left Atrium: Left atrial size was normal in size. Right  Atrium: Right atrial size was normal in size. Pericardium: There is no evidence of pericardial effusion. Mitral Valve: The mitral valve is normal in structure. Mild mitral valve regurgitation. No evidence of mitral valve stenosis. Tricuspid Valve: The tricuspid valve is normal in structure. Tricuspid valve regurgitation is trivial. No evidence of tricuspid stenosis. Aortic Valve: The aortic valve is tricuspid. There is mild calcification of the aortic valve. Aortic valve regurgitation is not visualized. Aortic valve sclerosis/calcification is present, without any evidence of aortic stenosis. Pulmonic Valve: The pulmonic valve was normal in structure. Pulmonic valve regurgitation is not visualized. No evidence of pulmonic stenosis. Aorta: Aortic dilatation noted. There is borderline dilatation of the ascending aorta, measuring 38 mm. Venous: The inferior vena cava is normal in size with  greater than 50% respiratory variability, suggesting right atrial pressure of 3 mmHg. IAS/Shunts: No atrial level shunt detected by color flow Doppler.  LEFT VENTRICLE PLAX 2D LVIDd:         4.40 cm   Diastology LVIDs:         3.10 cm   LV e' medial:    8.27 cm/s LV PW:         0.80 cm   LV E/e' medial:  13.8 LV IVS:        0.90 cm   LV e' lateral:   12.70 cm/s LVOT diam:     1.90 cm   LV E/e' lateral: 9.0 LV SV:         79 LV SV Index:   48 LVOT Area:     2.84 cm  RIGHT VENTRICLE             IVC RV S prime:     12.20 cm/s  IVC diam: 1.30 cm TAPSE (M-mode): 1.9 cm LEFT ATRIUM           Index        RIGHT ATRIUM           Index LA diam:      3.20 cm 1.95 cm/m   RA Area:     12.00 cm LA Vol (A2C): 37.7 ml 22.95 ml/m  RA Volume:   23.30 ml  14.18 ml/m LA Vol (A4C): 32.0 ml 19.48 ml/m  AORTIC VALVE LVOT Vmax:   152.50 cm/s LVOT Vmean:  99.800 cm/s LVOT VTI:    0.277 m  AORTA Ao Root diam: 3.60 cm Ao Asc diam:  3.80 cm MITRAL VALVE MV Area (PHT): 4.06 cm     SHUNTS MV Decel Time: 187 msec     Systemic VTI:  0.28 m MV E velocity:  114.00 cm/s  Systemic Diam: 1.90 cm MV A velocity: 58.20 cm/s MV E/A ratio:  1.96 Toribio Fuel MD Electronically signed by Toribio Fuel MD Signature Date/Time: 03/22/2024/1:10:50 PM    Final    MR ANGIO HEAD WO CONTRAST Result Date: 03/22/2024 CLINICAL DATA:  Follow-up stroke EXAM: MRA HEAD WITHOUT CONTRAST TECHNIQUE: Angiographic images of the Circle of Willis were acquired using MRA technique without intravenous contrast. COMPARISON:  None Available. FINDINGS: MRA intracranial: Right-side: The internal carotid artery is patent without significant stenosis. The middle cerebral artery is patent without significant stenosis or proximal branch occlusion. The anterior cerebral artery is patent without significant stenosis or proximal branch occlusion. No aneurysm. Left side: The internal carotid artery is patent without significant stenosis. The middle cerebral artery is patent without significant stenosis or proximal branch occlusion. The anterior cerebral artery is patent without significant stenosis or proximal branch occlusion. No aneurysm. Posterior circulation: Both vertebral arteries are patent. The basilar artery is patent without significant stenosis. Both posterior cerebral arteries are patent. No aneurysm. Other comments: None IMPRESSION: Normal Electronically Signed   By: Nancyann Burns M.D.   On: 03/22/2024 09:21   MR BRAIN WO CONTRAST Result Date: 03/22/2024 EXAM: MRI BRAIN WITHOUT CONTRAST 03/21/2024 08:43:30 PM TECHNIQUE: Multiplanar multisequence MRI of the head/brain was performed without the administration of intravenous contrast. COMPARISON: None available. CLINICAL HISTORY: New onset ataxia. FINDINGS: BRAIN AND VENTRICLES: Small acute infarcts in the right frontal white matter, left occipital lobe, and left cerebllum. Punctate acute infract in the high left frontal lobe (series 5 image 89). No mass. No midline shift. No hydrocephalus. Normal flow voids. ORBITS: No acute abnormality.  SINUSES AND MASTOIDS: No acute abnormality. BONES AND SOFT TISSUES: Normal marrow signal. IMPRESSION: 1. Small acute infarcts in the bilateral frontal lobes, left occipital lobe, and left cerebellum. Given involvement of multiple vascular territories, consider embolic etiology. Electronically signed by: Gilmore Molt MD 03/22/2024 12:44 AM EST RP Workstation: [id number]   VAS US  CAROTID Result Date: 03/21/2024 Carotid Arterial Duplex Study Patient Name:  CORDIE BEAZLEY  Date of Exam:   03/21/2024 Medical Rec #: [id number]       Accession #:    [id number] Date of Birth: 1950/12/31       Patient Gender: F Patient Age:   68 years Exam Location:  [facility] Procedure:      VAS US  CAROTID Referring Phys: FONDA ROBINS --------------------------------------------------------------------------------  Indications:       Carotid artery disease and TEVAR with left subclavian stent.                    Want to ensure adequate carotid and vertebral perfusion. Comparison Study:  No priors. Performing Technologist: Ricka Sturdivant-Jones RDMS, RVT  Examination Guidelines: A complete evaluation includes B-mode imaging, spectral Doppler, color Doppler, and power Doppler as needed of all accessible portions of each vessel. Bilateral testing is considered an integral part of a complete examination. Limited examinations for reoccurring indications may be performed as noted.  Right Carotid Findings: +----------+--------+--------+--------+------------------+------------------+           PSV cm/sEDV cm/sStenosisPlaque DescriptionComments           +----------+--------+--------+--------+------------------+------------------+ CCA Prox  81      14                                                   +----------+--------+--------+--------+------------------+------------------+ CCA Distal63      20                                intimal thickening  +----------+--------+--------+--------+------------------+------------------+ ICA Prox  38      13                                intimal thickening +----------+--------+--------+--------+------------------+------------------+ ICA Mid   141     36                                tortuous           +----------+--------+--------+--------+------------------+------------------+ ICA Distal108     30                                tortuous           +----------+--------+--------+--------+------------------+------------------+ ECA       141     22                                                   +----------+--------+--------+--------+------------------+------------------+ +----------+--------+-------+----------------+-------------------+           PSV cm/sEDV cmsDescribe        Arm Pressure (  mmHG) +----------+--------+-------+----------------+-------------------+ [id number]            Multiphasic, WNL                    +----------+--------+-------+----------------+-------------------+ +---------+--------+--+--------+--+---------+ VertebralPSV cm/s68EDV cm/s20Antegrade +---------+--------+--+--------+--+---------+ Elevated velocities in the mid ICA due to tortuosity of the vessel. Left Carotid Findings: +----------+--------+--------+--------+------------------+------------------+           PSV cm/sEDV cm/sStenosisPlaque DescriptionComments           +----------+--------+--------+--------+------------------+------------------+ CCA Prox  111     15                                                   +----------+--------+--------+--------+------------------+------------------+ CCA Distal88      25                                                   +----------+--------+--------+--------+------------------+------------------+ ICA Prox  67      19                                intimal thickening  +----------+--------+--------+--------+------------------+------------------+ ICA Mid   83      22                                                   +----------+--------+--------+--------+------------------+------------------+ ICA Distal101     29                                tortuous           +----------+--------+--------+--------+------------------+------------------+ ECA       90      16                                                   +----------+--------+--------+--------+------------------+------------------+ +----------+--------+--------+--------+-------------------+           PSV cm/sEDV cm/sDescribeArm Pressure (mmHG) +----------+--------+--------+--------+-------------------+ Subclavian226                                         +----------+--------+--------+--------+-------------------+ +---------+--------+--+--------+--+---------+ VertebralPSV cm/s71EDV cm/s21Antegrade +---------+--------+--+--------+--+---------+ Patent left subclavian stent  Summary: Right Carotid: The extracranial vessels were near-normal with only minimal wall                thickening or plaque. Left Carotid: The extracranial vessels were near-normal with only minimal wall               thickening or plaque. Vertebrals:  Bilateral vertebral arteries demonstrate antegrade flow. Subclavians: Normal flow hemodynamics were seen in bilateral subclavian              arteries. *See table(s) above for measurements and observations.  Electronically signed by Gaile New MD on 03/21/2024 at 5:36:37 PM.  Final       Blood pressure (!) 108/42, pulse 88, temperature (!) 97.4 F (36.3 C), temperature source Oral, resp. rate 20, height 5' 2 (1.575 m), weight 63.5 kg, SpO2 99%.  Medical Problem List and Plan: 1. Functional deficits secondary to ***  -patient may *** shower  -ELOS/Goals: ***  2.  Antithrombotics: -DVT/anticoagulation:  Mechanical: Sequential compression devices, below  knee Bilateral lower extremities Pharmaceutical: Other (comment) Paradaxa 150 mg BID   -antiplatelet therapy: Aspirin 81 mg   3. Pain Management: On home Flexeril  10 mg. Oxycodone and tylenol  prn   4. Mood/Behavior/Sleep: LCSW to follow for evaluation and support when available.   -antipsychotic agents: n/a  5. Neuropsych/cognition: This patient *** capable of making decisions on *** own behalf.  6. Skin/Wound Care: Routine pressure relief measures.   7. Fluids/Electrolytes/Nutrition: Monitor I&O and weight. Follow up labs CBC/CMP     GERD: Protonix 40 mg daily, using prn Pepcid    8. Status post TEVAR with subclavian branch endoprosthesis: Continue Pradaxa  and Asprin   9. Stroke: MRI  showed Small acute infarcts in the bilateral frontal lobes, left occipital lobe, and left cerebellum. 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  Recommended resuming simvastatin  and follow up with outpatient neurology [facility].  Simvastatin  not resumed will inquire about this.   10.HLD: Resume Simvastatin  10 mg   11. Shortness of Breath: Chest x-ray demonstrated a stable small area of atelectasis within the left lung.  O2 sats 96-99% room air  - Incentive spirometer  12.  Tachycardia: Stable, continue Toprol  XL 12.5 mg daily     ***  Daphne LOISE Satterfield, NP 03/23/2024

## 2024-03-23 NOTE — Discharge Instructions (Signed)
  [facility]   Discharge Instructions  Endovascular Aortic Aneurysm Repair  Please refer to the following instructions for your post-procedure care. Your surgeon or Physician Assistant will discuss any changes with you.  Activity  You are encouraged to walk as much as you can. You can slowly return to normal activities but must avoid strenuous activity and heavy lifting until your doctor tells you it's OK. Avoid activities such as vacuuming or swinging a gold club. It is normal to feel tired for several weeks after your surgery. Do not drive until your doctor gives the OK and you are no longer taking prescription pain medications. It is also normal to have difficulty with sleep habits, eating, and bowel movements after surgery. These will go away with time.  Bathing/Showering  Shower daily after you go home.  Do not soak in a bathtub, hot tub, or swim until the incision heals completely.  If you have incisions in your groin, wash the groin wounds with soap and water daily and pat dry. (No tub bath-only shower)  Then put a dry gauze or washcloth there to keep this area dry to help prevent wound infection daily and as needed.  Do not use Vaseline or neosporin on your incisions.  Only use soap and water on your incisions and then protect and keep dry.  Incision Care  Shower every day. Clean your incision with mild soap and water. Pat the area dry with a clean towel. You do not need a bandage unless otherwise instructed. Do not apply any ointments or creams to your incision. If you clothing is irritating, you may cover your incision with a dry gauze pad.  Diet  Resume your normal diet. There are no special food restrictions following this procedure. A low fat/low cholesterol diet is recommended for all patients with vascular disease. In order to heal from your surgery, it is CRITICAL to get adequate nutrition. Your body requires vitamins, minerals, and protein.  Vegetables are the best source of vitamins and minerals. Vegetables also provide the perfect balance of protein. Processed food has little nutritional value, so try to avoid this.  Medications  Resume taking all of your medications unless your doctor or nurse practitioner tells you not to. If your incision is causing pain, you may take over-the-counter pain relievers such as acetaminophen  (Tylenol ). If you were prescribed a stronger pain medication, please be aware these medications can cause nausea and constipation. Prevent nausea by taking the medication with a snack or meal. Avoid constipation by drinking plenty of fluids and eating foods with a high amount of fiber, such as fruits, vegetables, and grains.  Do not take Tylenol  if you are taking prescription pain medications.   Follow up  Our office will schedule a follow-up appointment with a CT scan 4 weeks after your surgery.  Please call us  immediately for any of the following conditions  Severe or worsening pain in your legs or feet or in your abdomen back or chest. Increased pain, redness, drainage (pus) from your incision site. Increased abdominal pain, bloating, nausea, vomiting or persistent diarrhea. Fever of 101 degrees or higher. Swelling in your leg (s),  Reduce your risk of vascular disease  Stop smoking. If you would like help call [facility] at 1-800-QUIT-NOW ((814)129-5586) or [place] at [phone] [phone]. Manage your cholesterol Maintain a desired weight Control your diabetes Keep your blood pressure down  If you have questions, please call the office at [phone].

## 2024-03-23 NOTE — TOC Transition Note (Signed)
 Transition of Care [facility]) - Discharge Note   Patient Details  Name: Terri Alexander MRN: [id number] Date of Birth: 06-27-1950  [facility] CM/SW Contact:  Roxie KANDICE Stain, RN Phone Number: 03/23/2024, 12:55 PM   Clinical Narrative:    Patient stable to discharge to CIR today.    Final next level of care: IP Rehab Facility Barriers to Discharge: Barriers Resolved   Patient Goals and CMS Choice         IP-rehab   Discharge Placement                       Discharge Plan and Services Additional resources added to the After Visit Summary for                                       Social Drivers of Health (SDOH) Interventions SDOH Screenings   Food Insecurity: No Food Insecurity (03/19/2024)  Housing: Low Risk  (03/19/2024)  Transportation Needs: No Transportation Needs (03/19/2024)  Utilities: Not At Risk (03/19/2024)  Alcohol Screen: Low Risk  (06/30/2023)  Depression (PHQ2-9): Low Risk  (11/14/2023)  Financial Resource Strain: Low Risk  (11/14/2023)  Physical Activity: Insufficiently Active (11/14/2023)  Social Connections: Unknown (03/19/2024)  Stress: No Stress Concern Present (11/14/2023)  Tobacco Use: Low Risk  (03/19/2024)  Health Literacy: Adequate Health Literacy (06/30/2023)     Readmission Risk Interventions     No data to display

## 2024-03-23 NOTE — Progress Notes (Signed)
 Mobility Specialist Progress Note:    03/23/24 1122  Mobility  Activity Ambulated with assistance  Level of Assistance Contact guard assist, steadying assist  Assistive Device Front wheel walker  Distance Ambulated (ft) 200 ft  Activity Response Tolerated well  Mobility Referral Yes  Mobility visit 1 Mobility  Mobility Specialist Start Time (ACUTE ONLY) 1121  Mobility Specialist Stop Time (ACUTE ONLY) 1131  Mobility Specialist Time Calculation (min) (ACUTE ONLY) 10 min   Pt received in bed, agreeable and eager for session. Ambulated in hallway with RW. MinG to correct posterior lean. Tolerated well, asx throughout. Returned to bed, sitting up in chair with all needs met.    Mandeep Kiser Mobility Specialist Please contact via Special Educational Needs Teacher or  Rehab office at [phone]

## 2024-03-24 DIAGNOSIS — I639 Cerebral infarction, unspecified: Secondary | ICD-10-CM

## 2024-03-24 LAB — CBC WITH DIFFERENTIAL/PLATELET
Abs Immature Granulocytes: 0.09 K/uL — ABNORMAL HIGH (ref 0.00–0.07)
Basophils Absolute: 0.1 K/uL (ref 0.0–0.1)
Basophils Relative: 1 %
Eosinophils Absolute: 0 K/uL (ref 0.0–0.5)
Eosinophils Relative: 0 %
HCT: 23.8 % — ABNORMAL LOW (ref 36.0–46.0)
Hemoglobin: 8.2 g/dL — ABNORMAL LOW (ref 12.0–15.0)
Immature Granulocytes: 1 %
Lymphocytes Relative: 15 %
Lymphs Abs: 1.7 K/uL (ref 0.7–4.0)
MCH: 30.4 pg (ref 26.0–34.0)
MCHC: 34.5 g/dL (ref 30.0–36.0)
MCV: 88.1 fL (ref 80.0–100.0)
Monocytes Absolute: 1.5 K/uL — ABNORMAL HIGH (ref 0.1–1.0)
Monocytes Relative: 14 %
Neutro Abs: 7.6 K/uL (ref 1.7–7.7)
Neutrophils Relative %: 69 %
Platelets: 250 K/uL (ref 150–400)
RBC: 2.7 MIL/uL — ABNORMAL LOW (ref 3.87–5.11)
RDW: 15.2 % (ref 11.5–15.5)
WBC: 10.9 K/uL — ABNORMAL HIGH (ref 4.0–10.5)
nRBC: 0 % (ref 0.0–0.2)

## 2024-03-24 LAB — COMPREHENSIVE METABOLIC PANEL WITH GFR
ALT: 22 U/L (ref 0–44)
AST: 27 U/L (ref 15–41)
Albumin: 2.5 g/dL — ABNORMAL LOW (ref 3.5–5.0)
Alkaline Phosphatase: 58 U/L (ref 38–126)
Anion gap: 10 (ref 5–15)
BUN: 9 mg/dL (ref 8–23)
CO2: 22 mmol/L (ref 22–32)
Calcium: 7.9 mg/dL — ABNORMAL LOW (ref 8.9–10.3)
Chloride: 102 mmol/L (ref 98–111)
Creatinine, Ser: 0.68 mg/dL (ref 0.44–1.00)
GFR, Estimated: >60 mL/min (ref >60)
Glucose, Bld: 113 mg/dL — ABNORMAL HIGH (ref 70–99)
Potassium: 4 mmol/L (ref 3.5–5.1)
Sodium: 134 mmol/L — ABNORMAL LOW (ref 135–145)
Total Bilirubin: 1 mg/dL (ref 0.0–1.2)
Total Protein: 5.6 g/dL — ABNORMAL LOW (ref 6.5–8.1)

## 2024-03-24 MED ORDER — MAGNESIUM HYDROXIDE 400 MG/5ML PO SUSP
30.0000 mL | Freq: Once | ORAL | Status: DC
  Filled 2024-03-24: qty 30

## 2024-03-24 NOTE — Plan of Care (Signed)
  Problem: Consults Goal: RH STROKE PATIENT EDUCATION Description: See Patient Education module for education specifics  Outcome: Progressing Goal: Nutrition Consult-if indicated Outcome: Progressing   Problem: RH BOWEL ELIMINATION Goal: RH STG MANAGE BOWEL WITH ASSISTANCE Description: STG Manage Bowel with mod I Assistance. Outcome: Progressing Goal: RH STG MANAGE BOWEL W/MEDICATION W/ASSISTANCE Description: STG Manage Bowel with Medication with mod I  Assistance. Outcome: Progressing   Problem: RH SAFETY Goal: RH STG ADHERE TO SAFETY PRECAUTIONS W/ASSISTANCE/DEVICE Description: STG Adhere to Safety Precautions With cues Assistance/Device. Outcome: Progressing   Problem: RH PAIN MANAGEMENT Goal: RH STG PAIN MANAGED AT OR BELOW PT'S PAIN GOAL Description: Pain < 4 with prns Outcome: Progressing   Problem: RH KNOWLEDGE DEFICIT Goal: RH STG INCREASE KNOWLEGDE OF HYPERLIPIDEMIA Description: Patient and spouse will be able to manage HLD using educational resources for medications and dietary modification independently Outcome: Progressing Goal: RH STG INCREASE KNOWLEDGE OF STROKE PROPHYLAXIS Description: Patient and spouse will be able to manage secondary risks using educational resources for medications and dietary modification independently Outcome: Progressing

## 2024-03-24 NOTE — Progress Notes (Signed)
 Inpatient Rehabilitation Admission Medication Review by a Pharmacist  A complete drug regimen review was completed for this patient to identify any potential clinically significant medication issues.  High Risk Drug Classes Is patient taking? Indication by Medication  Antipsychotic Yes, as an intravenous medication Compazine - nausea/vomiting  Anticoagulant Yes Pradaxa  - atrial fibrillation  Antibiotic No   Opioid Yes Oxycodone - pain  Antiplatelet Yes bASA - vascular stent  Hypoglycemics/insulin No   Vasoactive Medication Yes Metoprolol  succinate - heart rhythm and hypertension  Chemotherapy No   Other Yes fleet enema , PEG , docusate , bisacodyl , Senokot-S , and - constipation Pantoprazole- reflux  Famotidine - reflux  Diphenhydramine - itching  Acetaminophen - pain  Robitussin- cough   trazodone and -insomnia Simvastatin  - HLD APAP,flexeril  - pain Chloraseptic - mouth pain     Type of Medication Issue Identified Description of Issue Recommendation(s)  Drug Interaction(s) (clinically significant)     Duplicate Therapy     Allergy     No Medication Administration End Date     Incorrect Dose     Additional Drug Therapy Needed     Significant med changes from prior encounter (inform family/care partners about these prior to discharge).  Communicate relevant medication changes to patient/family members at discharge from CIR.   Restart or discontinue PTA meds not resumed in CIR at discharge if clinically indicated.   Other       Clinically significant medication issues were identified that warrant physician communication and completion of prescribed/recommended actions by midnight of the next day:  No  Name of provider notified for urgent issues identified:   Provider Method of Notification:     Pharmacist comments:   Time spent performing this drug regimen review (minutes):  15  Larraine Brazier, PharmD Clinical Pharmacist 03/24/2024  10:40 AM **Pharmacist phone  directory can now be found on [email or web address] ([email or web address]).  Listed under [facility].

## 2024-03-24 NOTE — Evaluation (Signed)
 Physical Therapy Assessment and Plan  Patient Details  Name: Terri Alexander MRN: [id number] Date of Birth: 04-01-1951  PT Diagnosis: Difficulty walking, Hemiparesis non-dominant, Muscle spasms, Muscle weakness, and Pain in upper back Rehab Potential: Good ELOS: 14-16 days   Today's Date: 03/24/2024 PT Individual Time: 0803-0919 PT Individual Time Calculation (min): 76 min    Hospital Problem: Principal Problem:   CVA (cerebral vascular accident) [facility])   Past Medical History:  Past Medical History:  Diagnosis Date   Adenomatous colon polyp    Allergy    Anemia    as a child, no current problem   Arrhythmia    Atrial fibrillation (HCC)    Blood transfusion without reported diagnosis @ 9 months   9 months old   Cataract    bilateral - MD just watching    Dysrhythmia    A. Fib   GERD (gastroesophageal reflux disease)    History of hiatal hernia    Hyperlipidemia    Osteoporosis 04/15/2016   Peripheral vascular disease    Type B Aortic Dissection   Skin cancer 2013   Skin cancer- Nose, face   Past Surgical History:  Past Surgical History:  Procedure Laterality Date   ANGIOPLASTY, ARTERY, SUBCLAVIAN, ENDOVASCULAR, WITH STENT INSERTION Left 03/19/2024   Procedure: LEFT AXILLARY ARTERY STENT INSERTION;  Surgeon: [name], MD;  Location: [facility] OR;  Service: Vascular;  Laterality: Left;   ATRIAL FIBRILLATION ABLATION N/A 02/16/2023   Procedure: ATRIAL FIBRILLATION ABLATION;  Surgeon: [name], MD;  Location: [facility] INVASIVE CV LAB;  Service: Cardiovascular;  Laterality: N/A;   CARDIOVERSION N/A 09/09/2022   Procedure: CARDIOVERSION;  Surgeon: [name], MD;  Location: [facility] INVASIVE CV LAB;  Service: Cardiovascular;  Laterality: N/A;   CESAREAN SECTION  1981   x 1   CESAREAN SECTION     COLONOSCOPY  2016   hx polyp, tics, sigmoid colon mod tortuous Dr [name],    EMBOLECTOMY Left 03/19/2024   Procedure: EMBOLECTOMY OF LEFT BRACHIAL ARTERY;  Surgeon: [name], MD;  Location: [facility] OR;  Service: Vascular;  Laterality: Left;   EYE SURGERY Bilateral    retina repair x 2   FEMORAL ARTERY EXPLORATION Left 03/19/2024   Procedure: EXPOSURE OF BRACHIAL ARTERY WITH STENT INSERTION;  Surgeon: [name], MD;  Location: [facility] OR;  Service: Vascular;  Laterality: Left;   LAPAROTOMY  1979   removal of ovarian cyst   nissan fundiplication  05/17/1997   REPAIR OF COMPLEX TRACTION RETINAL DETACHMENT Bilateral 01/2019   SVT ABLATION N/A 12/09/2023   Procedure: SVT ABLATION;  Surgeon: [name], MD;  Location: [facility] INVASIVE CV LAB;  Service: Cardiovascular;  Laterality: N/A;   THORACIC AORTIC ENDOVASCULAR STENT GRAFT Left 03/19/2024   Procedure: INSERTION, ENDOVASCULAR STENT GRAFT, AORTA, THORACIC; STENTING X2 OF LEFT SUBCLAVIAN ARTERY;  Surgeon: [name], MD;  Location: [facility] OR;  Service: Vascular;  Laterality: Left;   ULTRASOUND GUIDANCE FOR VASCULAR ACCESS Bilateral 03/19/2024   Procedure: ULTRASOUND GUIDANCE, FOR VASCULAR ACCESS OF BILATERAL FEMORAL AND LEFT RADIAL ARTERY;  Surgeon: [name], MD;  Location: [facility] OR;  Service: Vascular;  Laterality: Bilateral;    Assessment & Plan Clinical Impression: Patient is a 73 y.o. female with PMHx atrial fibrillation (on Pradaxa ), HLD, PVD, and cataracts with known hx of type B aortic dissection with aneurysmal degeneration of the descending aorta who presented for scheduled TEVAR on 03/19/2024 by Dr. Lanis. The case was complicated by dissection  of the left subclavian artery with need for stenting. Of the axillary and brachial arteries to reestablish flow to left upper extremity. The patient was recovering well and walking with PT until on 11/4 where she had an episode of transient aphasia that lasted a couple of minutes followed by another episode on 11/5 with unsteadiness while walking. Neurology was consulted and a MRI of the brain was ordered after recurrent episodes and small acute infarcts in the  bilateral frontal lobes, left occipital lobe, and left cerebellum were noted. EF 60-65%, hemoglobin A1c 5.1, LDL 71. Neurology recommended resuming simvastatin  as patient has tolerated well in the past. Continue Pradaxa  and aspirin per vascular surgery. Prior to arrival the patient was independent in the community. Patient currently requires set-up min A for basic ADLs at RW level. Therapy evaluations completed due to patient decreased functional mobility was admitted for a comprehensive rehab program. Patient transferred to CIR on 03/23/2024 .   Patient currently requires min assist with mobility secondary to muscle weakness, decreased cardiorespiratoy endurance, unbalanced muscle activation, decreased initiation and decreased attention, and decreased standing balance, decreased balance strategies, and hemipareisis.  Prior to hospitalization, patient was independent  with mobility and lived with Spouse in a House (townhouse).  Home access is 1Stairs to enter.  Patient will benefit from skilled PT intervention to maximize safe functional mobility, minimize fall risk, and decrease caregiver burden for planned discharge home with 24 hour assist.  Anticipate patient will benefit from follow up OP at discharge.  PT - End of Session Activity Tolerance: Tolerates 30+ min activity with multiple rests Endurance Deficit: Yes PT Assessment Rehab Potential (ACUTE/IP ONLY): Good PT Barriers to Discharge: Inaccessible home environment;Decreased caregiver support PT Patient demonstrates impairments in the following area(s): Balance;Edema;Endurance;Motor;Safety;Perception PT Transfers Functional Problem(s): Bed Mobility;Bed to Chair;Car;Furniture PT Locomotion Functional Problem(s): Ambulation;Stairs PT Plan PT Intensity: Minimum of 1-2 x/day ,45 to 90 minutes PT Frequency: 5 out of 7 days PT Duration Estimated Length of Stay: 14-16 days PT Treatment/Interventions: Ambulation/gait training;Community  reintegration;DME/adaptive equipment instruction;Neuromuscular re-education;Psychosocial support;Stair training;UE/LE Strength taining/ROM;Balance/vestibular training;Discharge planning;Functional electrical stimulation;Pain management;Skin care/wound management;Therapeutic Activities;UE/LE Coordination activities;Visual/perceptual remediation/compensation;Therapeutic Exercise;Splinting/orthotics;Patient/family education;Functional mobility training;Disease management/prevention;Cognitive remediation/compensation PT Transfers Anticipated Outcome(s): Mod I PT Locomotion Anticipated Outcome(s): Mod I/ supervision PT Recommendation Recommendations for Other Services: Therapeutic Recreation consult Therapeutic Recreation Interventions: Pet therapy;Kitchen group;Stress management Follow Up Recommendations: Home health PT;24 hour supervision/assistance Patient destination: Home Equipment Recommended: To be determined   PT Evaluation Precautions/Restrictions Precautions Precautions: Fall Precaution/Restrictions Comments: L weakness Restrictions Weight Bearing Restrictions Per Provider Order: No General   Vital SignsTherapy Vitals Pulse Rate: 77 BP: 113/72 Patient Position (if appropriate): Sitting Oxygen Therapy SpO2: 90 % O2 Device: Room Air Patient Activity (if Appropriate): In chair Pain Pain Assessment Pain Scale: 0-10 Pain Score: 3  (does have chronic spasms to RUE) Pain Type: Chronic pain Pain Location: Back Pain Orientation: Upper Pain Descriptors / Indicators: Aching;Discomfort Pain Onset: On-going Pain Intervention(s): Medication (See eMAR) Pain Interference Pain Interference Pain Effect on Sleep: 1. Rarely or not at all Pain Interference with Therapy Activities: 1. Rarely or not at all Pain Interference with Day-to-Day Activities: 2. Occasionally Home Living/Prior Functioning Home Living Available Help at Discharge: Family;Available 24 hours/day Type of Home: House  (townhouse) Home Access: Stairs to enter [employer] of Steps: 1 Home Layout: One level Bathroom Shower/Tub: Walk-in Contractor: Handicapped height Bathroom Accessibility: Yes  Lives With: Spouse Prior Function Level of Independence: Independent with basic ADLs;Independent with homemaking with ambulation;Independent with gait;Independent  with transfers  Able to Take Stairs?: Reciprically Driving: Yes Vocation: Retired Gaffer: crime movies, reading, tries exercises each morning 3x/wk Vision/Perception  Vision - History Ability to See in Adequate Light: 0 Adequate  Cognition Overall Cognitive Status: Within Functional Limits for tasks assessed Arousal/Alertness: Awake/alert Orientation Level: Oriented X4 Attention: Focused;Sustained Focused Attention: Appears intact Sustained Attention: Appears intact Memory: Appears intact Awareness: Impaired Problem Solving: Appears intact Safety/Judgment: Appears intact Sensation Sensation Light Touch: Appears Intact Coordination Gross Motor Movements are Fluid and Coordinated: No Fine Motor Movements are Fluid and Coordinated: No Coordination and Movement Description: slightly slow to initiate, L hemibody weaker than R hemibody Motor  Motor Motor: Hemiplegia Motor - Skilled Clinical Observations: slightly slow to initiate, L hemibody weaker than R hemibody   Trunk/Postural Assessment  Cervical Assessment Cervical Assessment: Within Functional Limits Thoracic Assessment Thoracic Assessment: Within Functional Limits Lumbar Assessment Lumbar Assessment: Within Functional Limits Postural Control Postural Control: Deficits on evaluation Righting Reactions: delayed Protective Responses: delayed  Balance Balance Balance Assessed: Yes Static Sitting Balance Static Sitting - Balance Support: Feet supported Static Sitting - Level of Assistance: 7: Independent Dynamic Sitting  Balance Dynamic Sitting - Balance Support: Feet supported Dynamic Sitting - Level of Assistance: 7: Independent Dynamic Sitting Balance - Compensations: limited by back pain Static Standing Balance Static Standing - Balance Support: Bilateral upper extremity supported Static Standing - Level of Assistance: Other (comment) (CGA) Static Standing - Comment/# of Minutes: mild posterior bias in initial rise to stand Dynamic Standing Balance Dynamic Standing - Balance Support: No upper extremity supported Dynamic Standing - Level of Assistance: 4: Min assist Extremity Assessment      RLE Assessment General Strength Comments: grossly 3+ to 4-/5 LLE Assessment LLE Assessment: Within Functional Limits General Strength Comments: grossly 4/5  Care Tool Care Tool Bed Mobility Roll left and right activity   Roll left and right assist level: Supervision/Verbal cueing    Sit to lying activity   Sit to lying assist level: Minimal Assistance - Patient > 75%    Lying to sitting on side of bed activity   Lying to sitting on side of bed assist level: the ability to move from lying on the back to sitting on the side of the bed with no back support.: Contact Guard/Touching assist     Care Tool Transfers Sit to stand transfer   Sit to stand assist level: Minimal Assistance - Patient > 75%    Chair/bed transfer   Chair/bed transfer assist level: Minimal Assistance - Patient > 75%    Car transfer   Car transfer assist level: Minimal Assistance - Patient > 75%;Contact Guard/Touching assist      Care Tool Locomotion Ambulation   Assist level: Minimal Assistance - Patient > 75% Assistive device: Walker-rolling Max distance: 90 ft  Walk 10 feet activity   Assist level: Minimal Assistance - Patient > 75% Assistive device: Walker-rolling   Walk 50 feet with 2 turns activity   Assist level: Minimal Assistance - Patient > 75% Assistive device: Walker-rolling  Walk 150 feet activity Walk 150  feet activity did not occur: Safety/medical concerns      Walk 10 feet on uneven surfaces activity Walk 10 feet on uneven surfaces activity did not occur: Safety/medical concerns      Stairs   Assist level: Minimal Assistance - Patient > 75% Stairs assistive device: 2 hand rails Max number of stairs: 4 (6)  Walk up/down 1 step activity   Walk up/down 1 step (curb)  assist level: Minimal Assistance - Patient > 75% Walk up/down 1 step or curb assistive device: 2 hand rails  Walk up/down 4 steps activity   Walk up/down 4 steps assist level: Minimal Assistance - Patient > 75% Walk up/down 4 steps assistive device: 2 hand rails  Walk up/down 12 steps activity Walk up/down 12 steps activity did not occur: Safety/medical concerns      Pick up small objects from floor   Pick up small object from the floor assist level: Maximal Assistance - Patient 25 - 49% Pick up small object from the floor assistive device: RW  Wheelchair Is the patient using a wheelchair?: Yes (will use for convenience while in rehab, not expecte to use upon d/c home) Type of Wheelchair: Manual   Wheelchair assist level: Total Assistance - Patient < 25% Max wheelchair distance: 200 ft  Wheel 50 feet with 2 turns activity   Assist Level: Total Assistance - Patient < 25%  Wheel 150 feet activity   Assist Level: Total Assistance - Patient < 25%    Refer to Care Plan for Long Term Goals  SHORT TERM GOAL WEEK 1    Recommendations for other services: Therapeutic Recreation  Kitchen group and Stress management  Skilled Therapeutic Intervention Mobility Bed Mobility Bed Mobility: Supine to Sit;Sit to Supine Supine to Sit: Contact Guard/Touching assist Sit to Supine: Minimal Assistance - Patient > 75%;Contact Guard/Touching assist Transfers Transfers: Sit to Stand;Stand to Sit;Stand Pivot Transfers Sit to Stand: Minimal Assistance - Patient > 75% Stand to Sit: Minimal Assistance - Patient > 75% Stand Pivot  Transfers: Minimal Assistance - Patient > 75% Stand Pivot Transfer Details: Verbal cues for technique;Verbal cues for precautions/safety Transfer (Assistive device): Rolling walker Locomotion  Gait Ambulation: Yes Gait Assistance: Minimal Assistance - Patient > 75%;Contact Guard/Touching assist Gait Distance (Feet): 95 Feet Assistive device: Rolling walker Gait Assistance Details: Verbal cues for gait pattern;Verbal cues for safe use of DME/AE;Verbal cues for precautions/safety Gait Gait: Yes Gait Pattern: Impaired Gait Pattern: Decreased step length - left;Decreased stance time - left;Decreased hip/knee flexion - left;Wide base of support Gait velocity: decreased, grossly <0.4 m/s Stairs / Additional Locomotion Stairs: Yes Stairs Assistance: Minimal Assistance - Patient > 75%;Contact Guard/Touching assist Stair Management Technique: Two rails;Alternating pattern;Forwards Number of Stairs: 4 Height of Stairs: 6 Wheelchair Mobility Wheelchair Mobility: No  Skilled Intervention: PT Evaluation completed; see above for results. PT educated patient in roles of PT vs OT, PT POC, rehab potential, rehab goals, and discharge recommendations along with recommendation for follow-up rehabilitation services. Individual treatment initiated:  Patient seated upright in recliner upon PT arrival. Patient alert and agreeable to PT session.   No pain complaint during session.  Pt performs all mobility as listed above. Initially requires MinA for standing transfers d/t slight delay in initiation and posterior bias in stance, but is able to improve to CGA by end of session with NMR intervention for improved setup resulting in improved balance. Pt tending to discard RW to side prior to toilet transfer in order to use safety rail. VC provided to continue to use RW to complete turn. Performs pericare with supervision/ Mod I.   Car transfer performed well with good technique and sits to seat prior to pivoting  BLE into footwell of car.   Gait training requires vc for increased focus to LLE performance. Initially produces step-to gait pattern with pauses in advancement of RW but quickly improves in step-through gait with improved confidence in movement. Requires increased focus to  LLE for improved overall quality of gait. Advances each step well into BOS of RW while maintaining RW at good distance ahead of self.  Patient seated in recliner at end of session with brakes locked, no alarm set as husband providing supervision and pt with good cognition, and all needs within reach.   Discharge Criteria: Patient will be discharged from PT if patient refuses treatment 3 consecutive times without medical reason, if treatment goals not met, if there is a change in medical status, if patient makes no progress towards goals or if patient is discharged from hospital.  The above assessment, treatment plan, treatment alternatives and goals were discussed and mutually agreed upon: by patient and by family  Mliss DELENA Milliner PT, DPT, CSRS 03/24/2024, 12:14 PM

## 2024-03-24 NOTE — Progress Notes (Addendum)
  Progress Note    03/24/2024 10:02 AM * No surgery found *  Subjective: Says her shoulders are little bit sore.  Also endorses some soreness in the chest    Vitals:   03/24/24 0419 03/24/24 0735  BP: (!) 122/50 112/74  Pulse: 87 81  Resp: 17   Temp: 98.7 F (37.1 C)   SpO2: 94%     Physical Exam: General: Sitting in the chair at therapy Lungs: Nonlabored Incisions: Left brachial incision well-appearing.  Bilateral groin incisions dry Extremities: Bilateral lower extremities well-perfused.  Left hand warm and well-perfused with intact motor and sensation.  Bruising in the left arm is slowly improving   CBC    Component Value Date/Time   WBC 10.9 (H) 03/24/2024 0509   RBC 2.70 (L) 03/24/2024 0509   HGB 8.2 (L) 03/24/2024 0509   HGB 13.6 11/09/2023 0842   HCT 23.8 (L) 03/24/2024 0509   HCT 42.7 11/09/2023 0842   PLT 250 03/24/2024 0509   PLT 335 11/09/2023 0842   MCV 88.1 03/24/2024 0509   MCV 94 11/09/2023 0842   MCH 30.4 03/24/2024 0509   MCHC 34.5 03/24/2024 0509   RDW 15.2 03/24/2024 0509   RDW 12.6 11/09/2023 0842   LYMPHSABS 1.7 03/24/2024 0509   MONOABS 1.5 (H) 03/24/2024 0509   EOSABS 0.0 03/24/2024 0509   BASOSABS 0.1 03/24/2024 0509    BMET    Component Value Date/Time   NA 134 (L) 03/24/2024 0509   NA 139 11/09/2023 0841   K 4.0 03/24/2024 0509   CL 102 03/24/2024 0509   CO2 22 03/24/2024 0509   GLUCOSE 113 (H) 03/24/2024 0509   BUN 9 03/24/2024 0509   BUN 21 11/09/2023 0841   CREATININE 0.68 03/24/2024 0509   CREATININE 1.13 (H) 10/03/2023 0956   CALCIUM 7.9 (L) 03/24/2024 0509   GFRNONAA >60 03/24/2024 0509   GFRNONAA 88 09/18/2019 1039   GFRAA 102 09/18/2019 1039    INR    Component Value Date/Time   INR 1.3 (H) 03/19/2024 1700    No intake or output data in the 24 hours ending 03/24/24 1002    Assessment/Plan:  73 y.o. female is 5 days post op, s/p:  TEVAR with subclavian branch endoprosthesis    - She says she is feeling  better this morning.  She is currently mobilizing with physical therapy.  She does endorse some soreness in her chest, likely related to stretching of the aorta due to stent graft repair. -Bilateral groin and left arm incisions are well-appearing without further bleeding.  Improved bruising of the left arm -Left upper extremity remains warm and well-perfused with intact motor and sensation -Continue aspirin, statin and Pradaxa    Ahmed Holster, PA-C [facility] [phone] 03/24/2024 10:02 AM  VASCULAR STAFF ADDENDUM: I have independently interviewed and examined the patient. I agree with the above.   Debby SAILOR. Magda, MD [facility] Phone Number: [phone] 03/24/2024 10:10 AM

## 2024-03-24 NOTE — Plan of Care (Signed)
  Problem: RH Balance Goal: LTG Patient will maintain dynamic standing balance (PT) Description: LTG:  Patient will maintain dynamic standing balance with assistance during mobility activities (PT) Flowsheets (Taken 03/24/2024 1706) LTG: Pt will maintain dynamic standing balance during mobility activities with:: Independent with assistive device    Problem: Sit to Stand Goal: LTG:  Patient will perform sit to stand with assistance level (PT) Description: LTG:  Patient will perform sit to stand with assistance level (PT) Flowsheets (Taken 03/24/2024 1706) LTG: PT will perform sit to stand in preparation for functional mobility with assistance level: Independent with assistive device   Problem: RH Bed Mobility Goal: LTG Patient will perform bed mobility with assist (PT) Description: LTG: Patient will perform bed mobility with assistance, with/without cues (PT). Flowsheets (Taken 03/24/2024 1706) LTG: Pt will perform bed mobility with assistance level of: Independent   Problem: RH Bed to Chair Transfers Goal: LTG Patient will perform bed/chair transfers w/assist (PT) Description: LTG: Patient will perform bed to chair transfers with assistance (PT). Flowsheets (Taken 03/24/2024 1706) LTG: Pt will perform Bed to Chair Transfers with assistance level: Independent with assistive device    Problem: RH Car Transfers Goal: LTG Patient will perform car transfers with assist (PT) Description: LTG: Patient will perform car transfers with assistance (PT). Flowsheets (Taken 03/24/2024 1706) LTG: Pt will perform car transfers with assist:: Set up assist    Problem: RH Furniture Transfers Goal: LTG Patient will perform furniture transfers w/assist (OT/PT) Description: LTG: Patient will perform furniture transfers  with assistance (OT/PT). Flowsheets (Taken 03/24/2024 1706) LTG: Pt will perform furniture transfers with assist:: Supervision/Verbal cueing   Problem: RH Ambulation Goal: LTG Patient will  ambulate in home environment (PT) Description: LTG: Patient will ambulate in home environment, # of feet with assistance (PT). Flowsheets (Taken 03/24/2024 1706) LTG: Pt will ambulate in home environ  assist needed:: Supervision/Verbal cueing LTG: Ambulation distance in home environment: up to 50 ft using LRAD Goal: LTG Patient will ambulate in community environment (PT) Description: LTG: Patient will ambulate in community environment, # of feet with assistance (PT). Flowsheets (Taken 03/24/2024 1706) LTG: Pt will ambulate in community environ  assist needed:: Supervision/Verbal cueing LTG: Ambulation distance in community environment: more than 400 ft using LRAD   Problem: RH Stairs Goal: LTG Patient will ambulate up and down stairs w/assist (PT) Description: LTG: Patient will ambulate up and down # of stairs with assistance (PT) Flowsheets (Taken 03/24/2024 1706) LTG: Pt will ambulate up/down stairs assist needed:: Supervision/Verbal cueing LTG: Pt will  ambulate up and down number of stairs: at least one step with no HR setup as per home environment

## 2024-03-24 NOTE — Evaluation (Signed)
 Occupational Therapy Assessment and Plan  Patient Details  Name: Terri Alexander MRN: [id number] Date of Birth: 1950/07/10  OT Diagnosis: muscle weakness (generalized) Rehab Potential: Rehab Potential (ACUTE ONLY): Good ELOS: 2wks   Today's Date: 03/24/2024 OT Individual Time: 1045-1200 OT Individual Time Calculation (min): 75 min     Hospital Problem: Principal Problem:   CVA (cerebral vascular accident) [facility])   Past Medical History:  Past Medical History:  Diagnosis Date   Adenomatous colon polyp    Allergy    Anemia    as a child, no current problem   Arrhythmia    Atrial fibrillation (HCC)    Blood transfusion without reported diagnosis @ 9 months   9 months old   Cataract    bilateral - MD just watching    Dysrhythmia    A. Fib   GERD (gastroesophageal reflux disease)    History of hiatal hernia    Hyperlipidemia    Osteoporosis 04/15/2016   Peripheral vascular disease    Type B Aortic Dissection   Skin cancer 2013   Skin cancer- Nose, face   Past Surgical History:  Past Surgical History:  Procedure Laterality Date   ANGIOPLASTY, ARTERY, SUBCLAVIAN, ENDOVASCULAR, WITH STENT INSERTION Left 03/19/2024   Procedure: LEFT AXILLARY ARTERY STENT INSERTION;  Surgeon: [name], MD;  Location: [facility] OR;  Service: Vascular;  Laterality: Left;   ATRIAL FIBRILLATION ABLATION N/A 02/16/2023   Procedure: ATRIAL FIBRILLATION ABLATION;  Surgeon: [name], MD;  Location: [facility] INVASIVE CV LAB;  Service: Cardiovascular;  Laterality: N/A;   CARDIOVERSION N/A 09/09/2022   Procedure: CARDIOVERSION;  Surgeon: [name], MD;  Location: [facility] INVASIVE CV LAB;  Service: Cardiovascular;  Laterality: N/A;   CESAREAN SECTION  1981   x 1   CESAREAN SECTION     COLONOSCOPY  2016   hx polyp, tics, sigmoid colon mod tortuous Dr [name],    EMBOLECTOMY Left 03/19/2024   Procedure: EMBOLECTOMY OF LEFT BRACHIAL ARTERY;  Surgeon: [name], MD;  Location: [facility] OR;  Service:  Vascular;  Laterality: Left;   EYE SURGERY Bilateral    retina repair x 2   FEMORAL ARTERY EXPLORATION Left 03/19/2024   Procedure: EXPOSURE OF BRACHIAL ARTERY WITH STENT INSERTION;  Surgeon: [name], MD;  Location: [facility] OR;  Service: Vascular;  Laterality: Left;   LAPAROTOMY  1979   removal of ovarian cyst   nissan fundiplication  05/17/1997   REPAIR OF COMPLEX TRACTION RETINAL DETACHMENT Bilateral 01/2019   SVT ABLATION N/A 12/09/2023   Procedure: SVT ABLATION;  Surgeon: [name], MD;  Location: [facility] INVASIVE CV LAB;  Service: Cardiovascular;  Laterality: N/A;   THORACIC AORTIC ENDOVASCULAR STENT GRAFT Left 03/19/2024   Procedure: INSERTION, ENDOVASCULAR STENT GRAFT, AORTA, THORACIC; STENTING X2 OF LEFT SUBCLAVIAN ARTERY;  Surgeon: [name], MD;  Location: [facility] OR;  Service: Vascular;  Laterality: Left;   ULTRASOUND GUIDANCE FOR VASCULAR ACCESS Bilateral 03/19/2024   Procedure: ULTRASOUND GUIDANCE, FOR VASCULAR ACCESS OF BILATERAL FEMORAL AND LEFT RADIAL ARTERY;  Surgeon: [name], MD;  Location: [facility] OR;  Service: Vascular;  Laterality: Bilateral;    Assessment & Plan Clinical Impression: Terri Alexander is a 73 year old female with PMHx atrial fibrillation (on Pradaxa ), HLD, PVD, and cataracts with known hx of type B aortic dissection with aneurysmal degeneration of the descending aorta who presented for scheduled TEVAR on 03/19/2024 by Dr. Lanis.  The case was complicated by dissection of the  left subclavian artery with need for stenting. Of the axillary and brachial arteries to reestablish flow to left upper extremity.  The patient was recovering well and walking with PT until on 11/4 where she had an episode of transient aphasia that lasted a couple of minutes followed by another episode on 11/5 with unsteadiness while walking.  Neurology was consulted and a MRI of the brain was ordered after recurrent episodes and small acute infarcts in the bilateral frontal lobes,  left occipital lobe, and left cerebellum were noted.  EF 60-65%, hemoglobin A1c 5.1, LDL 71.  Neurology recommended resuming simvastatin  as patient has tolerated well in the past.  Continue Pradaxa  and aspirin per vascular surgery. Prior to arrival the patient was independent in the community.  Patient currently requires set-up min A for basic ADLs at RW level. Therapy evaluations completed due to patient decreased functional mobility was admitted for a comprehensive rehab program.  Patient transferred to CIR on 03/23/2024 .    Patient currently requires MinA to CGA with basic self-care skills secondary to muscle weakness and decreased coordination.  Prior to hospitalization, patient could complete BADL with independent .  Patient will benefit from skilled intervention to decrease level of assist with basic self-care skills prior to discharge home with care partner.  Anticipate patient will require 24 hour supervision and .  OT - End of Session Activity Tolerance: Tolerates 30+ min activity with multiple rests Endurance Deficit: Yes Endurance Deficit Description: Patient present with fatigue of 7 on 0-10 OT Assessment Rehab Potential (ACUTE ONLY): Good OT Barriers to Discharge: None OT Patient demonstrates impairments in the following area(s): Balance;Endurance;Motor OT Basic ADL's Functional Problem(s): Toileting;Bathing OT Advanced ADL's Functional Problem(s): Simple Meal Preparation;Light Housekeeping OT Transfers Functional Problem(s): Tub/Shower OT Plan OT Intensity: Minimum of 1-2 x/day, 45 to 90 minutes OT Frequency: 5 out of 7 days OT Duration/Estimated Length of Stay: 2wks OT Treatment/Interventions: Self Care/advanced ADL retraining;Therapeutic Activities;UE/LE Coordination activities;Therapeutic Exercise;Functional mobility training;UE/LE Strength taining/ROM;Neuromuscular re-education;Balance/vestibular training OT Self Feeding Anticipated Outcome(s): Ind OT Basic Self-Care  Anticipated Outcome(s): Ind OT Toileting Anticipated Outcome(s): Ind OT Bathroom Transfers Anticipated Outcome(s): Ind OT Recommendation Recommendations for Other Services: None Patient destination: Home Follow Up Recommendations: None Equipment Recommended: 3 in 1 bedside comode   OT Evaluation Precautions/Restrictions  Precautions Precautions: Fall Recall of Precautions/Restrictions: Intact Precaution/Restrictions Comments: L weakness Restrictions Weight Bearing Restrictions Per Provider Order: No General Chart Reviewed: Yes Family/Caregiver Present: Yes Vital Signs Therapy Vitals Pulse Rate: 77 BP: 113/72 Patient Position (if appropriate): Sitting Oxygen Therapy SpO2: 90 % O2 Device: Room Air Patient Activity (if Appropriate): In chair Pain Pain Assessment Pain Scale: 0-10 Pain Score: 6  Pain Type: Chronic pain Pain Location: Shoulder Pain Orientation: Left Pain Descriptors / Indicators: Aching Pain Onset: On-going Pain Intervention(s): Medication (See eMAR) Multiple Pain Sites: Yes 2nd Pain Site Pain Score: 4 Pain Location: Chest Pain Orientation: Left Pain Descriptors / Indicators: Aching Patient's Stated Pain Goal: 2 Pain Intervention(s): Medication (See eMAR) Home Living/Prior Functioning Home Living Family/patient expects to be discharged to:: Private residence Living Arrangements: Spouse/significant other Available Help at Discharge: Family, Available 24 hours/day Type of Home: House (Townhouse) Home Access: Stairs to enter (1 step going into the garage, her typical route.and 4-5 at the front entrance) Entrance Stairs-Number of Steps: 1 Home Layout: One level Bathroom Shower/Tub: Walk-in shower, Curtain (the tub/shower combo has suction grab bar, The walkin has built in grab bar and seat with magnitized hand held shower nozzle) Bathroom Toilet: Handicapped height  Bathroom Accessibility: Yes  Lives With: Spouse IADL History Homemaking  Responsibilities: Yes Meal Prep Responsibility: Primary Laundry Responsibility: Primary Cleaning Responsibility: Primary Current License: Yes Occupation: Retired Type of Occupation: Heritage Manager: reading, cooking, walking Prior Function Level of Independence: Independent with basic ADLs, Independent with homemaking with ambulation, Independent with gait, Independent with transfers  Able to Take Stairs?: Reciprically Driving: Yes Vocation: Retired Gaffer: crime movies, reading, tries exercises each morning 3x/wk Vision Baseline Vision/History: 1 Wears glasses Ability to See in Adequate Light: 0 Adequate Patient Visual Report: No change from baseline Vision Assessment?: No apparent visual deficits Tracking/Visual Pursuits: Able to track stimulus in all quads without difficulty Convergence: Within functional limits Visual Fields: No apparent deficits Perception  Perception: Within Functional Limits Praxis   Cognition Cognition Overall Cognitive Status: Within Functional Limits for tasks assessed Arousal/Alertness: Awake/alert Orientation Level: Person;Situation;Place Person: Oriented Place: Oriented Situation: Oriented Memory: Appears intact Attention: Focused;Sustained Focused Attention: Appears intact Sustained Attention: Appears intact Awareness: Appears intact Problem Solving: Appears intact Safety/Judgment: Appears intact Brief Interview for Mental Status (BIMS) Repetition of Three Words (First Attempt): 3 Temporal Orientation: Year: Correct Temporal Orientation: Month: Accurate within 5 days Temporal Orientation: Day: Correct Recall: Sock: No, could not recall Recall: Blue: Yes, no cue required Recall: Bed: Yes, no cue required BIMS Summary Score: 13 Sensation Sensation Light Touch: Appears Intact Coordination Gross Motor Movements are Fluid and Coordinated: No Fine Motor Movements are Fluid and Coordinated:  No Coordination and Movement Description: Required additional time with gen weakness impacating how she approach her environment. Motor  Motor Motor: Hemiplegia Motor - Skilled Clinical Observations: movements a little guarded  Trunk/Postural Assessment  Cervical Assessment Cervical Assessment: Within Functional Limits Thoracic Assessment Thoracic Assessment: Within Functional Limits Lumbar Assessment Lumbar Assessment: Exceptions to [facility] Postural Control Postural Control: Within Functional Limits  Balance Balance Balance Assessed: Yes Static Sitting Balance Static Sitting - Balance Support: Feet supported Static Sitting - Level of Assistance: 7: Independent Dynamic Sitting Balance Dynamic Sitting - Balance Support: Feet supported Dynamic Sitting - Level of Assistance: 7: Independent Extremity/Trunk Assessment RUE Assessment RUE Assessment: Within Functional Limits Passive Range of Motion (PROM) Comments: WFL Active Range of Motion (AROM) Comments: WFL General Strength Comments: 3+/5MMT LUE Assessment Passive Range of Motion (PROM) Comments: WFL Active Range of Motion (AROM) Comments: WFL General Strength Comments: 3/5MMT  Care Tool Care Tool Self Care Eating   Eating Assist Level: Set up assist    Oral Care    Oral Care Assist Level: Set up assist    Bathing   Body parts bathed by patient: Right arm;Left arm;Chest;Abdomen;Front perineal area;Buttocks;Right upper leg;Left upper leg;Right lower leg;Left lower leg;Face (requiring frequent restbreaks and CGA for standing)     Assist Level: Contact Guard/Touching assist    Upper Body Dressing(including orthotics)   What is the patient wearing?: Pull over shirt   Assist Level: Set up assist    Lower Body Dressing (excluding footwear)   What is the patient wearing?: Underwear/pull up;Pants Assist for lower body dressing: Set up assist    Putting on/Taking off footwear   What is the patient wearing?:  Socks;Shoes Assist for footwear: Set up assist       Care Tool Toileting Toileting activity   Assist for toileting: Supervision/Verbal cueing     Care Tool Bed Mobility Roll left and right activity   Roll left and right assist level: Supervision/Verbal cueing    Sit to lying activity   Sit to lying assist  level: Minimal Assistance - Patient > 75%    Lying to sitting on side of bed activity   Lying to sitting on side of bed assist level: the ability to move from lying on the back to sitting on the side of the bed with no back support.: Contact Guard/Touching assist     Care Tool Transfers Sit to stand transfer   Sit to stand assist level: Minimal Assistance - Patient > 75% (MinA to CGA)    Chair/bed transfer   Chair/bed transfer assist level: Minimal Assistance - Patient > 75%     Toilet transfer   Assist Level: Supervision/Verbal cueing (CGA to S)     Care Tool Cognition  Expression of Ideas and Wants Expression of Ideas and Wants: 4. Without difficulty (complex and basic) - expresses complex messages without difficulty and with speech that is clear and easy to understand  Understanding Verbal and Non-Verbal Content Understanding Verbal and Non-Verbal Content: 4. Understands (complex and basic) - clear comprehension without cues or repetitions   Memory/Recall Ability Memory/Recall Ability : Current season;That he or she is in a hospital/hospital unit   Refer to Care Plan for Long Term Goals  SHORT TERM GOAL WEEK 1 OT Short Term Goal 1 (Week 1): The pt will safety complete all functional t/f's to all surfaces with ModI. OT Short Term Goal 2 (Week 1): The pt will safely bathe UB/LB with ModI at incorporating AE as needed. OT Short Term Goal 3 (Week 1): The ppt will dress UB/LB with ModI incorporating AE as needed. OT Short Term Goal 4 (Week 1): The pt will safely tolerate > 30 minutes of OT acivity requiring  < 4 rest breaks. OT Short Term Goal 5 (Week 1): The pt will  safely complete a simple home making task requiring < 3 rest breaks.  Recommendations for other services: None    Skilled Therapeutic Intervention  Patient seen on today for skilled Occupational Therapy Assessment. Patient was in good spirits with a willingness to participate.  The patient presented with a pain response of 4 on a 0-10 for her L shld and chest. The pt went on to say that nursing was able ot address her pain response with medication.  The pt reports being able to complete BADL related task at an independent LOF prior to her most recent episode. The pt is currently able to complete bathing of UB/LB with closeS incorporating the grab bars as needed. The pt is Min to [facility] for transfers to all surfaces incorporating the RW.  The patient is met with challenges in relation to activity tolerance in relation to work over time and would benefit from Occupational Therapy to improve her level of endurance and her core strength for a safe and independent return to home to reduce the burden of care for the care provider. The pt is recommended for 2 weeks of Occupational Therapy to improve her functional outcome towards goal attainment with the current objectives in place with modification in the POC as needed. Additional services to be determined prior to d/c.  ADL ADL Equipment Provided: Other (comment) (RW, w/c) Eating: Set up (based on funcitnal status) Where Assessed-Eating: Chair Grooming: Setup Where Assessed-Grooming: Sitting at sink Upper Body Bathing: Setup Where Assessed-Upper Body Bathing: Sitting at sink Lower Body Bathing: Setup;Supervision/safety Where Assessed-Lower Body Bathing: Sitting at sink;Standing at sink Upper Body Dressing: Setup Where Assessed-Upper Body Dressing: Sitting at sink Lower Body Dressing: Setup Where Assessed-Lower Body Dressing: Sitting at sink Toileting: Supervision/safety Where  Assessed-Toileting: Teacher, Adult Education: Company Secretary Method: Facilities Manager: Engineer, Technical Sales: International Aid/development Worker Method: Ambulating (based on performance during funcitonal task performance) Tub/Shower Equipment: Information systems manager with back Film/video Editor: Minimal assistance Film/video Editor Method: Designer, Industrial/product: Shower seat with back (based on performance at sink LOF) Mobility  Bed Mobility Bed Mobility: Supine to Sit;Sit to Supine Supine to Sit: Contact Guard/Touching assist;Supervision/Verbal cueing Sit to Supine: Minimal Assistance - Patient > 75%;Contact Guard/Touching assist Transfers Sit to Stand: Minimal Assistance - Patient > 75%;Contact Guard/Touching assist Stand to Sit: Minimal Assistance - Patient > 75%;Contact Guard/Touching assist   Discharge Criteria: Patient will be discharged from OT if patient refuses treatment 3 consecutive times without medical reason, if treatment goals not met, if there is a change in medical status, if patient makes no progress towards goals or if patient is discharged from hospital.  The above assessment, treatment plan, treatment alternatives and goals were discussed and mutually agreed upon: by patient and by family  [name] 03/24/2024, 3:52 PM

## 2024-03-24 NOTE — Progress Notes (Signed)
 PROGRESS NOTE   Subjective/Complaints: No complaints this morning Appreciate pharmacy medication review and vascular surgery following Hypotensive  ROS: +shoulder soreness  Objective:   DG Chest Port 1 View Result Date: 03/23/2024 CLINICAL DATA:  Shortness of breath EXAM: PORTABLE CHEST 1 VIEW COMPARISON:  Chest radiograph dated 03/19/2024 FINDINGS: Normal lung volumes. Unchanged dense left retrocardiac opacity. Unchanged trace blunting of the left costophrenic angle. Similar cardiomediastinal silhouette. No acute osseous abnormality. Similar aortic, left common carotid, and left axillary vascular stent graft. IMPRESSION: 1. Unchanged dense left retrocardiac opacity, likely atelectasis. Aspiration or pneumonia can be considered in the appropriate clinical setting. 2. Unchanged trace left pleural effusion. Electronically Signed   By: Limin  Xu M.D.   On: 03/23/2024 09:10   Recent Labs    03/23/24 1043 03/24/24 0509  WBC 12.9* 10.9*  HGB 9.5* 8.2*  HCT 28.4* 23.8*  PLT 248 250   Recent Labs    03/23/24 0834 03/24/24 0509  NA 136 134*  K 3.7 4.0  CL 104 102  CO2 20* 22  GLUCOSE 122* 113*  BUN 11 9  CREATININE 0.64 0.68  CALCIUM 8.1* 7.9*   No intake or output data in the 24 hours ending 03/24/24 1313      Physical Exam: Vital Signs Blood pressure 113/72, pulse 77, temperature 98.7 F (37.1 C), temperature source Oral, resp. rate 17, height 5' 2 (1.575 m), weight 66.9 kg, SpO2 90%. Gen: no distress, normal appearing HEENT: oral mucosa pink and moist, NCAT Cardio: Reg rate Chest: normal effort, normal rate of breathing Abd: soft, non-distended Ext: no edema Psych: pleasant, normal affect Skin: intact Neurologic exam:  Cognition: AAO to person, place, time and event.  + Cannot add change, cannot spell world backwards, some mild delay in more complex cognitive tasks Language: Mild dysarthria. Names 2/3 objects  correctly.  Memory: Recalls 1/3 objects at 5 minutes, 3 of 3 with cues. Insight: Good insight into current condition.  Mood: Pleasant affect, appropriate mood.  Sensation: To light touch intact in BL UEs and LEs   Reflexes: 2+ in BL UE and LEs. Negative Hoffman's and babinski signs bilaterally.   CN: Mild left facial droop, left tongue deviation Coordination: No apparent tremors. No ataxia on FTN, HTS bilaterally.  Spasticity: MAS 0 in all extremities.       Strength:                RUE: 5-/5 SA, 5-/5 EF, 5-/5 EE, 5-/5 WE, 5/5 FF, 5/5 FA                LUE:  5-/5 SA, 5-/5 EF, 5-/5 EE, 5-/5 WE, 5/5 FF, 5/5 FA                RLE: 5/5 HF, 5/5 KE, 5/5  DF, 5/5  EHL, 5/5  PF                 LLE:  4+/5 HF, 4+/5 KE, 5-/5  DF, 5-/5  EHL, 5-/5  PF, stable 11/8  Assessment/Plan: 1. Functional deficits which require 3+ hours per day of interdisciplinary therapy in a comprehensive inpatient rehab setting. Physiatrist is providing close team supervision  and 24 hour management of active medical problems listed below. Physiatrist and rehab team continue to assess barriers to discharge/monitor patient progress toward functional and medical goals  Care Tool:  Bathing    Body parts bathed by patient: Right arm, Left arm, Chest, Abdomen, Front perineal area, Buttocks, Right upper leg, Left upper leg, Right lower leg, Left lower leg, Face (requiring frequent restbreaks and CGA for standing)         Bathing assist Assist Level: Contact Guard/Touching assist     Upper Body Dressing/Undressing Upper body dressing   What is the patient wearing?: Pull over shirt    Upper body assist Assist Level: Set up assist    Lower Body Dressing/Undressing Lower body dressing      What is the patient wearing?: Underwear/pull up, Pants     Lower body assist Assist for lower body dressing: Set up assist     Toileting Toileting    Toileting assist Assist for toileting: Supervision/Verbal cueing      Transfers Chair/bed transfer  Transfers assist     Chair/bed transfer assist level: Minimal Assistance - Patient > 75%     Locomotion Ambulation   Ambulation assist      Assist level: Minimal Assistance - Patient > 75% Assistive device: Walker-rolling Max distance: 90 ft   Walk 10 feet activity   Assist     Assist level: Minimal Assistance - Patient > 75% Assistive device: Walker-rolling   Walk 50 feet activity   Assist    Assist level: Minimal Assistance - Patient > 75% Assistive device: Walker-rolling    Walk 150 feet activity   Assist Walk 150 feet activity did not occur: Safety/medical concerns         Walk 10 feet on uneven surface  activity   Assist Walk 10 feet on uneven surfaces activity did not occur: Safety/medical concerns         Wheelchair     Assist Is the patient using a wheelchair?: Yes (will use for convenience while in rehab, not expecte to use upon d/c home) Type of Wheelchair: Manual    Wheelchair assist level: Total Assistance - Patient < 25% Max wheelchair distance: 200 ft    Wheelchair 50 feet with 2 turns activity    Assist        Assist Level: Total Assistance - Patient < 25%   Wheelchair 150 feet activity     Assist      Assist Level: Total Assistance - Patient < 25%   Blood pressure 113/72, pulse 77, temperature 98.7 F (37.1 C), temperature source Oral, resp. rate 17, height 5' 2 (1.575 m), weight 66.9 kg, SpO2 90%.  Medical Problem List and Plan: 1. Functional deficits secondary to Multifocal CVAs s/p TEVAR with subclavian branch endoprosthesis              -patient may shower             -ELOS/Goals: 10 to 12 days, supervision PT/OT             - On admission assessment, patient with cognitive and language deficits necessitating SLP evaluation; ordered, message team, can wait till Monday if needed.             -Continue CIR   2.  Antithrombotics: -DVT/anticoagulation:  Mechanical:  Sequential compression devices, below knee Bilateral lower extremities Pharmaceutical: Other (comment) Paradaxa 150 mg BID              -antiplatelet therapy: Aspirin  81 mg    3. Pain Management: On home Flexeril  10 mg. Oxycodone and tylenol  prn    4. Mood/Behavior/Sleep: LCSW to follow for evaluation and support when available.              -antipsychotic agents: n/a   5. Neuropsych/cognition: This patient may be intermittently capable of making decisions on her own behalf.   6. Skin/Wound Care: Routine pressure relief measures.    7. Fluids/Electrolytes/Nutrition: Monitor I&O and weight. Follow up labs CBC/CMP               GERD: Protonix 40 mg daily, using prn Pepcid     8. Status post TEVAR with subclavian branch endoprosthesis: continue Pradaxa  and Asprin    9. Stroke: MRI  showed Small acute infarcts in the bilateral frontal lobes, left occipital lobe, and left cerebellum. 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  Recommended resuming simvastatin  and follow up with outpatient neurology [facility].   - Simvastatin  10 mg daily resumed on inpatient rehab admission.      10.HLD: continue Simvastatin  10 mg    11. Shortness of Breath: Chest x-ray demonstrated a stable small area of atelectasis within the left lung.  O2 sats 96-99% room air             - Incentive spirometer   12.  Tachycardia: Stable, continue Toprol  XL 12.5 mg daily   13.  Constipation.  LBM 11/2- messaged nursing to confirm accuracyy.             -Continue senokot-S 1 tab twice daily and daily MiraLAX   14. Anemia: stool occult ordered, repeat CBC tomorrow  LOS: 1 days A FACE TO FACE EVALUATION WAS PERFORMED  Kathrin Folden P Rieley Khalsa 03/24/2024, 1:13 PM

## 2024-03-24 NOTE — Progress Notes (Signed)
 Occupational Therapy Session Note  Patient Details  Name: Terri Alexander MRN: [id number] Date of Birth: [birth year]  Today's Date: 03/24/2024 OT Individual Time: 9854-9754 OT Individual Time Calculation (min): 60 min    Short Term Goals: Week 1:  OT Short Term Goal 1 (Week 1): The pt will safety complete all functional t/f's to all surfaces with ModI. OT Short Term Goal 2 (Week 1): The pt will safely bathe UB/LB with ModI at incorporating AE as needed. OT Short Term Goal 3 (Week 1): The ppt will dress UB/LB with ModI incorporating AE as needed. OT Short Term Goal 4 (Week 1): The pt will safely tolerate > 30 minutes of OT acivity requiring  < 4 rest breaks. OT Short Term Goal 5 (Week 1): The pt will safely complete a simple home making task requiring < 3 rest breaks.  Skilled Therapeutic Interventions/Progress Updates:    BUE Exercise: Patient was seated in the room with family present at the time of treatment.  The pt was in agreement with completing UB exercise to improve core strength for safe and Ind function. The pt was able to complete 2 sets of 10 for shld flexion, shld abduction, shld rotation ,and lg circles with rest breaks as needed, requiring 3 rest breaks. The pt went on to complete the UB cycle  for 10 minutes induration with rest breaks as needed, the pt required 3 rest breaks.    LB Task Performance: The pt completed a simulated  task in LB dressing  using resistive band  3x's,  incorporating the RW and the arm of the recliner for coming into stand at [facility]. The pt went on to say that she needed to go to the restroom, the pt was able to come from sit to stand using the RW with MinA.  The pt was able to ambulate to the restroom and was able to doff her LB garments with SBA  inclusive of   her pants and underwear. The pt was able to complete toileting hygiene with close S. The pt was able to donn her LB clothing with SBA. The pt was able to ambulate to the sink area using the RW  with  CGA , she was able to  wash her hands with s/uA. The pt was able to ambulate to the recliner with CGA for placement with the call light and bedside table within reach and all additional needs addressed.   Self Care/advanced ADL retraining;Therapeutic Activities;UE/LE Coordination activities;Therapeutic Exercise;Functional mobility training;UE/LE Strength taining/ROM;Neuromuscular re-education;Balance/vestibular training   Therapy Documentation Precautions:  Precautions Precautions: Fall Recall of Precautions/Restrictions: Intact Precaution/Restrictions Comments: L weakness Restrictions Weight Bearing Restrictions Per Provider Order: No General: General Chart Reviewed: Yes Family/Caregiver Present: Yes Vital Signs: Therapy Vitals Pulse Rate: 77 BP: 113/72 Patient Position (if appropriate): Sitting Oxygen Therapy SpO2: 90 % O2 Device: Room Air Patient Activity (if Appropriate): In chair Pain: Pain Assessment Pain Scale: 0-10 Pain Score: 6  Pain Type: Chronic pain Pain Location: Shoulder Pain Orientation: Left Pain Descriptors / Indicators: Aching Pain Onset: On-going Pain Intervention(s): Medication (See eMAR) Multiple Pain Sites: Yes 2nd Pain Site Pain Score: 4 Pain Location: Chest Pain Orientation: Left Pain Descriptors / Indicators: Aching Patient's Stated Pain Goal: 2 Pain Intervention(s): Medication (See eMAR) ADL: ADL Equipment Provided: Other (comment) (RW, w/c) Eating: Set up (based on funcitnal status) Where Assessed-Eating: Chair Grooming: Setup Where Assessed-Grooming: Sitting at sink Upper Body Bathing: Setup Where Assessed-Upper Body Bathing: Sitting at sink Lower Body Bathing: Setup, Supervision/safety Where  Assessed-Lower Body Bathing: Sitting at sink, Standing at sink Upper Body Dressing: Setup Where Assessed-Upper Body Dressing: Sitting at sink Lower Body Dressing: Setup Where Assessed-Lower Body Dressing: Sitting at sink Toileting:  Supervision/safety Where Assessed-Toileting: Teacher, Adult Education: Curator Method: Facilities Manager: Engineer, Technical Sales: International Aid/development Worker Method: Ambulating (based on performance during funcitonal task performance) Tub/Shower Equipment: Information systems manager with back Film/video Editor: Minimal assistance Film/video Editor Method: Manufacturing Systems Engineer with back (based on performance at sink LOF) Vision Baseline Vision/History: 1 Wears glasses Patient Visual Report: No change from baseline Vision Assessment?: No apparent visual deficits Tracking/Visual Pursuits: Able to track stimulus in all quads without difficulty Convergence: Within functional limits Visual Fields: No apparent deficits Perception  Perception: Within Functional Limits Praxis   Balance Balance Balance Assessed: Yes Static Sitting Balance Static Sitting - Balance Support: Feet supported Static Sitting - Level of Assistance: 7: Independent Dynamic Sitting Balance Dynamic Sitting - Balance Support: Feet supported Dynamic Sitting - Level of Assistance: 7: Independent Exercises:   Other Treatments:     Therapy/Group: Individual Therapy  [name] 03/24/2024, 3:54 PM

## 2024-03-24 NOTE — Progress Notes (Signed)
 Reports at dinner noticing when eating food would have a sensation of food wanting to come back up. Denies any sensation of N/V at this time. Pain improving. Two bowel movements reported by patient today.

## 2024-03-25 DIAGNOSIS — I639 Cerebral infarction, unspecified: Secondary | ICD-10-CM | POA: Diagnosis not present

## 2024-03-25 LAB — CBC WITH DIFFERENTIAL/PLATELET
Abs Immature Granulocytes: 0.06 K/uL (ref 0.00–0.07)
Basophils Absolute: 0 K/uL (ref 0.0–0.1)
Basophils Relative: 0 %
Eosinophils Absolute: 0 K/uL (ref 0.0–0.5)
Eosinophils Relative: 0 %
HCT: 24.6 % — ABNORMAL LOW (ref 36.0–46.0)
Hemoglobin: 8.4 g/dL — ABNORMAL LOW (ref 12.0–15.0)
Immature Granulocytes: 1 %
Lymphocytes Relative: 14 %
Lymphs Abs: 1.4 K/uL (ref 0.7–4.0)
MCH: 30 pg (ref 26.0–34.0)
MCHC: 34.1 g/dL (ref 30.0–36.0)
MCV: 87.9 fL (ref 80.0–100.0)
Monocytes Absolute: 1.2 K/uL — ABNORMAL HIGH (ref 0.1–1.0)
Monocytes Relative: 12 %
Neutro Abs: 7.2 K/uL (ref 1.7–7.7)
Neutrophils Relative %: 73 %
Platelets: 293 K/uL (ref 150–400)
RBC: 2.8 MIL/uL — ABNORMAL LOW (ref 3.87–5.11)
RDW: 15 % (ref 11.5–15.5)
WBC: 10 K/uL (ref 4.0–10.5)
nRBC: 0 % (ref 0.0–0.2)

## 2024-03-25 LAB — MAGNESIUM: Magnesium: 2.3 mg/dL (ref 1.7–2.4)

## 2024-03-25 NOTE — Progress Notes (Signed)
 Occupational Therapy Session Note  Patient Details  Name: Terri Alexander MRN: [id number] Date of Birth: [birth year]  Today's Date: 03/25/2024 OT Individual Time: 0100-0220 OT Individual Time Calculation (min): 80 min    Short Term Goals: Week 1:  OT Short Term Goal 1 (Week 1): The pt will safety complete all functional t/f's to all surfaces with ModI. OT Short Term Goal 2 (Week 1): The pt will safely bathe UB/LB with ModI at incorporating AE as needed. OT Short Term Goal 3 (Week 1): The ppt will dress UB/LB with ModI incorporating AE as needed. OT Short Term Goal 4 (Week 1): The pt will safely tolerate > 30 minutes of OT acivity requiring  < 4 rest breaks. OT Short Term Goal 5 (Week 1): The pt will safely complete a simple home making task requiring < 3 rest breaks.  Skilled Therapeutic Interventions/Progress Updates:    The pt was seated in the room with family present at the time of treatment.  The pt indicated that she rested well during the night with no pain to report at the time of treatment. The pt completed a simulated task in LB dressing using theraband incorporating the RW with CGA .  The pt was able to come from sit to stand using the RW with  CGA for ambulating to the window for removing suction pegs by alternating hands. The pt was able to come from sit to stand  with CGA for ambulate around the room retrieving pieces of paper from the floor using the reacher.  The pt was able to ambulate back to EOB incorporating th RW for additional balance. The pt indicated that she needed to go to the restroom and was able to ambulate to the restroom using the RW for additional balance with CGA. The pt was able to manage LB clothing incorporating the grab bar for lowering herself to the commode with close S.  The pt was able to void and was able to complete toileting hygiene with close S, she was closeS for managing her LB clothing, underwear and pants using the grab bar.  The pt was able to ambulate  back to her living quarters using the RW with CGA. The pt  was able to go to the sink for washing her hands at s/uA.  The pt was able to ambulate to the bed and  go from standing to sitting EOB with close S. The call light and bedside table were placed within reach and all additional needs were addressed prior to exiting the room.   Therapy Documentation Precautions:  Precautions Precautions: Fall Recall of Precautions/Restrictions: Intact Precaution/Restrictions Comments: L weakness Restrictions Weight Bearing Restrictions Per Provider Order: No  Therapy/Group: Individual Therapy  [name] 03/25/2024, 5:39 PM

## 2024-03-25 NOTE — Evaluation (Signed)
 Speech Language Pathology Assessment and Plan  Patient Details  Name: Terri Alexander MRN: [id number] Date of Birth: [birth year]  SLP Diagnosis: Cognitive Impairments (at baseline for communication and cognition per pt and husband report)  Rehab Potential: Good ELOS: n/a    Today's Date: 03/25/2024 SLP Individual Time: 9084-8989 SLP Individual Time Calculation (min): 55 min   Hospital Problem: Principal Problem:   CVA (cerebral vascular accident) [facility])  Past Medical History:  Past Medical History:  Diagnosis Date   Adenomatous colon polyp    Allergy    Anemia    as a child, no current problem   Arrhythmia    Atrial fibrillation (HCC)    Blood transfusion without reported diagnosis @ 9 months   9 months old   Cataract    bilateral - MD just watching    Dysrhythmia    A. Fib   GERD (gastroesophageal reflux disease)    History of hiatal hernia    Hyperlipidemia    Osteoporosis 04/15/2016   Peripheral vascular disease    Type B Aortic Dissection   Skin cancer 2013   Skin cancer- Nose, face   Past Surgical History:  Past Surgical History:  Procedure Laterality Date   ANGIOPLASTY, ARTERY, SUBCLAVIAN, ENDOVASCULAR, WITH STENT INSERTION Left 03/19/2024   Procedure: LEFT AXILLARY ARTERY STENT INSERTION;  Surgeon: [name], MD;  Location: [facility] OR;  Service: Vascular;  Laterality: Left;   ATRIAL FIBRILLATION ABLATION N/A 02/16/2023   Procedure: ATRIAL FIBRILLATION ABLATION;  Surgeon: [name], MD;  Location: [facility] INVASIVE CV LAB;  Service: Cardiovascular;  Laterality: N/A;   CARDIOVERSION N/A 09/09/2022   Procedure: CARDIOVERSION;  Surgeon: [name], MD;  Location: [facility] INVASIVE CV LAB;  Service: Cardiovascular;  Laterality: N/A;   CESAREAN SECTION  1981   x 1   CESAREAN SECTION     COLONOSCOPY  2016   hx polyp, tics, sigmoid colon mod tortuous Dr [name],    EMBOLECTOMY Left 03/19/2024   Procedure: EMBOLECTOMY OF LEFT BRACHIAL ARTERY;  Surgeon: [name], MD;  Location: [facility] OR;  Service: Vascular;  Laterality: Left;   EYE SURGERY Bilateral    retina repair x 2   FEMORAL ARTERY EXPLORATION Left 03/19/2024   Procedure: EXPOSURE OF BRACHIAL ARTERY WITH STENT INSERTION;  Surgeon: [name], MD;  Location: [facility] OR;  Service: Vascular;  Laterality: Left;   LAPAROTOMY  1979   removal of ovarian cyst   nissan fundiplication  05/17/1997   REPAIR OF COMPLEX TRACTION RETINAL DETACHMENT Bilateral 01/2019   SVT ABLATION N/A 12/09/2023   Procedure: SVT ABLATION;  Surgeon: [name], MD;  Location: [facility] INVASIVE CV LAB;  Service: Cardiovascular;  Laterality: N/A;   THORACIC AORTIC ENDOVASCULAR STENT GRAFT Left 03/19/2024   Procedure: INSERTION, ENDOVASCULAR STENT GRAFT, AORTA, THORACIC; STENTING X2 OF LEFT SUBCLAVIAN ARTERY;  Surgeon: [name], MD;  Location: [facility] OR;  Service: Vascular;  Laterality: Left;   ULTRASOUND GUIDANCE FOR VASCULAR ACCESS Bilateral 03/19/2024   Procedure: ULTRASOUND GUIDANCE, FOR VASCULAR ACCESS OF BILATERAL FEMORAL AND LEFT RADIAL ARTERY;  Surgeon: [name], MD;  Location: [facility] OR;  Service: Vascular;  Laterality: Bilateral;    Assessment / Plan / Recommendation Clinical Impression  HPI: Terri Alexander is a 73 year old female with PMHx atrial fibrillation (on Pradaxa ), HLD, PVD, and cataracts with known hx of type B aortic dissection with aneurysmal degeneration of the descending aorta who presented for scheduled TEVAR on 03/19/2024 by Dr.  Robins. The case was complicated by dissection of the left subclavian artery with need for stenting. Of the axillary and brachial arteries to reestablish flow to left upper extremity. The patient was recovering well and walking with PT until on 11/4 where she had an episode of transient aphasia that lasted a couple of minutes followed by another episode on 11/5 with unsteadiness while walking. Neurology was consulted and a MRI of the brain was ordered after recurrent episodes and  small acute infarcts in the bilateral frontal lobes, left occipital lobe, and left cerebellum were noted. EF 60-65%, hemoglobin A1c 5.1, LDL 71. Neurology recommended resuming simvastatin  as patient has tolerated well in the past. Continue Pradaxa  and aspirin per vascular surgery. Prior to arrival the patient was independent in the community. Patient currently requires set-up min A for basic ADLs at RW level. Therapy evaluations completed due to patient decreased functional mobility was admitted for a comprehensive rehab program.    Skilled Therapeutic Interventions          Pt presents at baseline for speech, language and cognitive functions per SLP assessment and pt/husband report. Furthermore, she and her husband politely declined SLP intervention beyond SLP evaluation at this time.   Pt reported some memory deficits and word-finding deficits at baseline, which she uses compensatory strategies.  She feels that she is back to her cognitive-communication baseline following instances of prior transient aphasia symptoms. Per SLUMS assessment, pt presents with mild cognitive challenges, though functional cognition, however, tasks that were challenging in assessment are challenging for pt at baseline. For example, pt uses a calculator to complete calculations and writes information down for short term recall.   SLUMS:  Orientation: 3/3 Problem Solving: 1/3 Attention: Generative naming: 2/3 Delayed Recall: 5/5 Serial sequence reversal: 2/2 Clock Drawing: 4/4 Visuospatial: 2/2 Story Recall: 6/8  Total Score: 25/30  WAB-Bedside Form: 100%. Speech 100% fluent and intelligible. No anomia during this assessment.   Based on assessment results and pt's goals for rehabilitation, SLP intervention is not recommended at this time. Pt and her husband educated on cognitive strategies for safety at home and are well aware of SLP services available at next level of care if needed. SLP will sign off.     SLP  Assessment  Patient does not need any further Speech Lanaguage Pathology Services    Recommendations  Patient destination: Home Follow up Recommendations: None Equipment Recommended: None recommended by SLP    SLP Frequency  (d/c SLP therapy)   SLP Duration  SLP Intensity  SLP Treatment/Interventions n/a   (d/c SLP therapy)   (d/c SLP therapy)    Pain Pain Assessment Pain Scale: 0-10 Pain Score: 0-No pain Pain Location: Shoulder Pain Intervention(s): Medication (See eMAR)  Prior Functioning Cognitive/Linguistic Baseline: Baseline deficits Baseline deficit details: pt reported baseline memory deficits and occasional word finding deficits all managed with compensatory strategies. Type of Home: House  Lives With: Spouse Available Help at Discharge: Family;Available 24 hours/day Vocation: Retired  Architectural Technologist Overall Cognitive Status: History of cognitive impairments - at baseline Arousal/Alertness: Awake/alert Orientation Level: Oriented X4 Year: 2025 Day of Week: Correct Attention: Focused;Sustained Focused Attention: Appears intact Sustained Attention: Appears intact Memory: Appears intact Problem Solving: Impaired Problem Solving Impairment: Verbal complex (challenged with math problem, though pt reported she uses a calculator at baseline.) Safety/Judgment: Appears intact  Comprehension Auditory Comprehension Overall Auditory Comprehension: Appears within functional limits for tasks assessed Yes/No Questions: Within Functional Limits Commands: Within Functional Limits Conversation: Complex Visual Recognition/Discrimination Discrimination:  Within Function Limits Reading Comprehension Reading Status: Within funtional limits Expression Expression Primary Mode of Expression: Verbal Verbal Expression Overall Verbal Expression: Appears within functional limits for tasks assessed Initiation: No impairment Automatic Speech: Name;Social  Response Level of Generative/Spontaneous Verbalization: Conversation Repetition: No impairment Naming: No impairment Pragmatics: No impairment Non-Verbal Means of Communication: Not applicable Written Expression Dominant Hand: Right Written Expression: Within Functional Limits Oral Motor Oral Motor/Sensory Function Overall Oral Motor/Sensory Function: Within functional limits Motor Speech Overall Motor Speech: Appears within functional limits for tasks assessed Respiration: Within functional limits Phonation: Normal Resonance: Within functional limits Articulation: Within functional limitis Intelligibility: Intelligible Motor Planning: Within functional limits Motor Speech Errors: Not applicable  Care Tool Care Tool Cognition Ability to hear (with hearing aid or hearing appliances if normally used Ability to hear (with hearing aid or hearing appliances if normally used): 0. Adequate - no difficulty in normal conservation, social interaction, listening to TV   Expression of Ideas and Wants Expression of Ideas and Wants: 4. Without difficulty (complex and basic) - expresses complex messages without difficulty and with speech that is clear and easy to understand   Understanding Verbal and Non-Verbal Content Understanding Verbal and Non-Verbal Content: 4. Understands (complex and basic) - clear comprehension without cues or repetitions  Memory/Recall Ability Memory/Recall Ability : Current season;That he or she is in a hospital/hospital unit    Recommendations for other services: None   Discharge Criteria: Patient will be discharged from SLP if patient refuses treatment 3 consecutive times without medical reason, if treatment goals not met, if there is a change in medical status, if patient makes no progress towards goals or if patient is discharged from hospital.  The above assessment, treatment plan, treatment alternatives and goals were discussed and mutually agreed upon: by  patient and by family  Alynah Schone J Verble Styron 03/25/2024, 1:14 PM

## 2024-03-25 NOTE — Progress Notes (Addendum)
 PROGRESS NOTE   Subjective/Complaints: No new complaints this morning WBC decreased and Hgb increased Seen walking to and from bathroom to sink this morning with RW S  ROS: +shoulder soreness, denies pain  Objective:   No results found.  Recent Labs    03/24/24 0509 03/25/24 0451  WBC 10.9* 10.0  HGB 8.2* 8.4*  HCT 23.8* 24.6*  PLT 250 293   Recent Labs    03/23/24 0834 03/24/24 0509  NA 136 134*  K 3.7 4.0  CL 104 102  CO2 20* 22  GLUCOSE 122* 113*  BUN 11 9  CREATININE 0.64 0.68  CALCIUM 8.1* 7.9*    Intake/Output Summary (Last 24 hours) at 03/25/2024 1353 Last data filed at 03/25/2024 1340 Gross per 24 hour  Intake 720 ml  Output --  Net 720 ml        Physical Exam: Vital Signs Blood pressure (!) 111/52, pulse 82, temperature 98.7 F (37.1 C), temperature source Oral, resp. rate 17, height 5' 2 (1.575 m), weight 66.9 kg, SpO2 97%. Gen: no distress, normal appearing HEENT: oral mucosa pink and moist, NCAT Cardio: Reg rate Chest: normal effort, normal rate of breathing Abd: soft, non-distended Ext: no edema Psych: pleasant, normal affect Skin: intact Neurologic exam:  Cognition: AAO to person, place, time and event.  + Cannot add change, cannot spell world backwards, some mild delay in more complex cognitive tasks Language: Mild dysarthria. Names 2/3 objects correctly.  Memory: Recalls 1/3 objects at 5 minutes, 3 of 3 with cues. Insight: Good insight into current condition.  Mood: Pleasant affect, appropriate mood.  Sensation: To light touch intact in BL UEs and LEs   Reflexes: 2+ in BL UE and LEs. Negative Hoffman's and babinski signs bilaterally.   CN: Mild left facial droop, left tongue deviation Coordination: No apparent tremors. No ataxia on FTN, HTS bilaterally.  Spasticity: MAS 0 in all extremities.       Strength:                RUE: 5-/5 SA, 5-/5 EF, 5-/5 EE, 5-/5 WE, 5/5 FF, 5/5  FA                LUE:  5-/5 SA, 5-/5 EF, 5-/5 EE, 5-/5 WE, 5/5 FF, 5/5 FA                RLE: 5/5 HF, 5/5 KE, 5/5  DF, 5/5  EHL, 5/5  PF                 LLE:  4+/5 HF, 4+/5 KE, 5-/5  DF, 5-/5  EHL, 5-/5  PF, stable 11/9  Assessment/Plan: 1. Functional deficits which require 3+ hours per day of interdisciplinary therapy in a comprehensive inpatient rehab setting. Physiatrist is providing close team supervision and 24 hour management of active medical problems listed below. Physiatrist and rehab team continue to assess barriers to discharge/monitor patient progress toward functional and medical goals  Care Tool:  Bathing    Body parts bathed by patient: Right arm, Left arm, Chest, Abdomen, Front perineal area, Buttocks, Right upper leg, Left upper leg, Right lower leg, Left lower leg, Face (requiring frequent  restbreaks and CGA for standing)         Bathing assist Assist Level: Minimal Assistance - Patient > 75%     Upper Body Dressing/Undressing Upper body dressing   What is the patient wearing?: Pull over shirt    Upper body assist Assist Level: Set up assist    Lower Body Dressing/Undressing Lower body dressing      What is the patient wearing?: Underwear/pull up, Pants     Lower body assist Assist for lower body dressing: Set up assist     Toileting Toileting    Toileting assist Assist for toileting: Supervision/Verbal cueing     Transfers Chair/bed transfer  Transfers assist     Chair/bed transfer assist level: Minimal Assistance - Patient > 75%     Locomotion Ambulation   Ambulation assist      Assist level: Minimal Assistance - Patient > 75% Assistive device: Walker-rolling Max distance: 90 ft   Walk 10 feet activity   Assist     Assist level: Minimal Assistance - Patient > 75% Assistive device: Walker-rolling   Walk 50 feet activity   Assist    Assist level: Minimal Assistance - Patient > 75% Assistive device: Walker-rolling     Walk 150 feet activity   Assist Walk 150 feet activity did not occur: Safety/medical concerns         Walk 10 feet on uneven surface  activity   Assist Walk 10 feet on uneven surfaces activity did not occur: Safety/medical concerns         Wheelchair     Assist Is the patient using a wheelchair?: Yes (will use for convenience while in rehab, not expecte to use upon d/c home) Type of Wheelchair: Manual    Wheelchair assist level: Total Assistance - Patient < 25% Max wheelchair distance: 200 ft    Wheelchair 50 feet with 2 turns activity    Assist        Assist Level: Total Assistance - Patient < 25%   Wheelchair 150 feet activity     Assist      Assist Level: Total Assistance - Patient < 25%   Blood pressure (!) 111/52, pulse 82, temperature 98.7 F (37.1 C), temperature source Oral, resp. rate 17, height 5' 2 (1.575 m), weight 66.9 kg, SpO2 97%.  Medical Problem List and Plan: 1. Functional deficits secondary to Multifocal CVAs s/p TEVAR with subclavian branch endoprosthesis              -patient may shower             -ELOS/Goals: 10 to 12 days, supervision PT/OT             - On admission assessment, patient with cognitive and language deficits necessitating SLP evaluation; ordered, message team, can wait till Monday if needed.             -Continue CIR   2.  Antithrombotics: -DVT/anticoagulation:  Mechanical: Sequential compression devices, below knee Bilateral lower extremities Pharmaceutical: Other (comment) Paradaxa 150 mg BID              -antiplatelet therapy: Aspirin 81 mg    3. Pain Management: On home Flexeril  10 mg. Oxycodone and tylenol  prn    4. Mood/Behavior/Sleep: LCSW to follow for evaluation and support when available.              -antipsychotic agents: n/a   5. Neuropsych/cognition: This patient may be intermittently capable of making decisions on her  own behalf.   6. Skin/Wound Care: Routine pressure relief  measures.    7. Fluids/Electrolytes/Nutrition: Monitor I&O and weight. Follow up labs CBC/CMP               GERD: Protonix 40 mg daily, using prn Pepcid     8. Status post TEVAR with subclavian branch endoprosthesis: continue Pradaxa  and Asprin    9. Stroke: MRI  showed Small acute infarcts in the bilateral frontal lobes, left occipital lobe, and left cerebellum. 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  Recommended resuming simvastatin  and follow up with outpatient neurology [facility].   - Simvastatin  10 mg daily resumed on inpatient rehab admission.      10.HLD: continue Simvastatin  10 mg    11. Shortness of Breath: Chest x-ray demonstrated a stable small area of atelectasis within the left lung.  O2 sats 96-99% room air             - continue Incentive spirometer   12.  Tachycardia: resolved and patient is hypotensive, d/c toprol  XL   13.  Constipation.  LBM 11/8. Magnesium level reviewed and was 1.8 on 11//3, will add on level today.              -Continue senokot-S 1 tab twice daily and daily MiraLAX   14. Anemia: Hgb reviewed and is uptrending, will d/c stool occult  LOS: 2 days A FACE TO FACE EVALUATION WAS PERFORMED  Mayline Dragon P Donique Hammonds 03/25/2024, 1:53 PM

## 2024-03-25 NOTE — Plan of Care (Signed)
  Problem: RH BOWEL ELIMINATION Goal: RH STG MANAGE BOWEL WITH ASSISTANCE Description: STG Manage Bowel with mod I Assistance. Outcome: Progressing   Problem: RH SAFETY Goal: RH STG ADHERE TO SAFETY PRECAUTIONS W/ASSISTANCE/DEVICE Description: STG Adhere to Safety Precautions With cues Assistance/Device. Outcome: Progressing   Problem: RH PAIN MANAGEMENT Goal: RH STG PAIN MANAGED AT OR BELOW PT'S PAIN GOAL Description: Pain < 4 with prns Outcome: Progressing

## 2024-03-25 NOTE — Progress Notes (Signed)
 Physical Therapy Session Note  Patient Details  Name: Terri Alexander MRN: [id number] Date of Birth: 12-18-1950  Today's Date: 03/25/2024 PT Individual Time: 0800-0915 PT Individual Time Calculation (min): 75 min   Short Term Goals: Week 1:  PT Short Term Goal 1 (Week 1): Pt will demonstrate improved quality of gait with less restrictive AD than RW and overall CGA/ SBA. PT Short Term Goal 2 (Week 1): Pt will ambulate at least 150 ft using less restrictive AD than RW with overall CGA/ SBA. PT Short Term Goal 3 (Week 1): Pt will perform standing balance activities with no UE support and reduced posterior LOB. PT Short Term Goal 4 (Week 1): Pt will demosntrate ability to perform bed mobility with Mod I. PT Short Term Goal 5 (Week 1): Pt will perform all standing transfers with consistent supervision.  Skilled Therapeutic Interventions/Progress Updates:   Pt received sitting EOB, totally dressed, with husband present. Pt denies pain but states her shoulders ached all night which is normal for me. Pt agreeable to PT.  NMR: Standing bil UE flexion x 20 with neutral base of support to work on balance. Standing RW marches x 20; standing SLR x 20 to increase single limb balance with RW. Forward/backward weightshifts x 1 min; side to side weightshifts x 1 min. Weightshifts were without UE support.   Therex: STS light CGA throughout session for functional strengthening and after rest breaks and x 10 reps. SLR x 10 each side, supine hip abduction/SLR combo x 10 each side, hamstring digs supine x 15, bridge x 20, yellow weighted ball squeeze bridge x 20, SLR/hip circle into ER combo x 20. STS from EOM x 20 with concentration on control; standing glute set x 20. ISO LAQ with ankle pump x 20; supine clam shell x 20; supine hip flexion with core tight x 20; SLR hip alphabet x 1 set  GT: GT > 300  ft with RW with PT CGA and verbal cues for stride, posture and maintaining cadence. GT with RW x 175 ft with PT  CGA and verbal cues for posture, stride and pacing herself. With increased fatigue, pt noted to stub her toe more bil L > R. Forward/backward walking x 10 lbs x 10 feet each direction with RW with PT CGA for safety with pt difficulty controlling retro gait with PT CGA. GT x 198 ft with RW with PT CGA and verbal cues as above.  Pt left in recliner, husband present, all needs within reach. Pt with still complaints of shoulder soreness; states she will ask nursing for pain meds when ready.      Therapy Documentation Precautions:  Precautions Precautions: Fall Recall of Precautions/Restrictions: Intact Precaution/Restrictions Comments: L weakness Restrictions Weight Bearing Restrictions Per Provider Order: No     Therapy/Group: Individual Therapy  Alger Ada 03/25/2024, 7:13 AM

## 2024-03-26 ENCOUNTER — Other Ambulatory Visit: Payer: Self-pay

## 2024-03-26 DIAGNOSIS — M7918 Myalgia, other site: Secondary | ICD-10-CM | POA: Diagnosis not present

## 2024-03-26 DIAGNOSIS — D62 Acute posthemorrhagic anemia: Secondary | ICD-10-CM | POA: Diagnosis not present

## 2024-03-26 DIAGNOSIS — I878 Other specified disorders of veins: Secondary | ICD-10-CM

## 2024-03-26 DIAGNOSIS — I6310 Cerebral infarction due to embolism of unspecified precerebral artery: Secondary | ICD-10-CM | POA: Diagnosis not present

## 2024-03-26 DIAGNOSIS — I7123 Aneurysm of the descending thoracic aorta, without rupture: Secondary | ICD-10-CM

## 2024-03-26 LAB — BASIC METABOLIC PANEL WITH GFR
Anion gap: 10 (ref 5–15)
BUN: 10 mg/dL (ref 8–23)
CO2: 20 mmol/L — ABNORMAL LOW (ref 22–32)
Calcium: 8.1 mg/dL — ABNORMAL LOW (ref 8.9–10.3)
Chloride: 108 mmol/L (ref 98–111)
Creatinine, Ser: 0.54 mg/dL (ref 0.44–1.00)
GFR, Estimated: >60 mL/min (ref >60)
Glucose, Bld: 111 mg/dL — ABNORMAL HIGH (ref 70–99)
Potassium: 3.7 mmol/L (ref 3.5–5.1)
Sodium: 138 mmol/L (ref 135–145)

## 2024-03-26 LAB — CBC
HCT: 24.1 % — ABNORMAL LOW (ref 36.0–46.0)
Hemoglobin: 8 g/dL — ABNORMAL LOW (ref 12.0–15.0)
MCH: 29.5 pg (ref 26.0–34.0)
MCHC: 33.2 g/dL (ref 30.0–36.0)
MCV: 88.9 fL (ref 80.0–100.0)
Platelets: 379 K/uL (ref 150–400)
RBC: 2.71 MIL/uL — ABNORMAL LOW (ref 3.87–5.11)
RDW: 15.2 % (ref 11.5–15.5)
WBC: 11.2 K/uL — ABNORMAL HIGH (ref 4.0–10.5)
nRBC: 0 % (ref 0.0–0.2)

## 2024-03-26 MED ORDER — TROLAMINE SALICYLATE 10 % EX CREA: TOPICAL_CREAM | Freq: Two times a day (BID) | CUTANEOUS | Status: DC | PRN

## 2024-03-26 MED ORDER — DICLOFENAC SODIUM 1 % EX GEL
2.0000 g | Freq: Two times a day (BID) | CUTANEOUS | Status: DC | PRN
  Administered 2024-03-26 – 2024-03-29 (×4): 2 g via TOPICAL
  Filled 2024-03-26: qty 100

## 2024-03-26 NOTE — Progress Notes (Signed)
 [facility] Individual Statement of Services  Patient Name:  Terri Alexander  Date:  03/26/2024  Welcome to the [facility].  Our goal is to provide you with an individualized program based on your diagnosis and situation, designed to meet your specific needs.  With this comprehensive rehabilitation program, you will be expected to participate in at least 3 hours of rehabilitation therapies Monday-Friday, with modified therapy programming on the weekends.  Your rehabilitation program will include the following services:  Physical Therapy (PT), Occupational Therapy (OT), Speech Therapy (ST), 24 hour per day rehabilitation nursing, Care Coordinator, Rehabilitation Medicine, Nutrition Services, and Pharmacy Services  Weekly team conferences will be held on Wednesday to discuss your progress.  Your Inpatient Rehabilitation Care Coordinator will talk with you frequently to get your input and to update you on team discussions.  Team conferences with you and your family in attendance may also be held.  Expected length of stay: 2 weeks  Overall anticipated outcome: supervision-mod/I level  Depending on your progress and recovery, your program may change. Your Inpatient Rehabilitation Care Coordinator will coordinate services and will keep you informed of any changes. Your Inpatient Rehabilitation Care Coordinator's name and contact numbers are listed  below.  The following services may also be recommended but are not provided by the Inpatient [facility]:   Home Health Rehabiltiation Services Outpatient Rehabilitation Services    Arrangements will be made to provide these services after discharge if needed.  Arrangements include referral to agencies that provide these services.  Your insurance has been verified to be:  Medicare & UHC Your primary doctor is:  Butler Burr  Pertinent information will be shared with your doctor and your insurance  company.  Inpatient Rehabilitation Care Coordinator:  Rhoda Clement, KEN [phone] or ELIGAH BASQUES  Information discussed with and copy given to patient by: Clement Asberry MATSU, 03/26/2024, 8:32 AM

## 2024-03-26 NOTE — Progress Notes (Signed)
 Physical Therapy Session Note  Patient Details  Name: ELECIA SERAFIN MRN: [id number] Date of Birth: April 12, 1951  Today's Date: 03/26/2024 PT Individual Time: 0930-1013 PT Individual Time Calculation (min): 43 min   Short Term Goals: Week 1:  PT Short Term Goal 1 (Week 1): Pt will demonstrate improved quality of gait with less restrictive AD than RW and overall CGA/ SBA. PT Short Term Goal 2 (Week 1): Pt will ambulate at least 150 ft using less restrictive AD than RW with overall CGA/ SBA. PT Short Term Goal 3 (Week 1): Pt will perform standing balance activities with no UE support and reduced posterior LOB. PT Short Term Goal 4 (Week 1): Pt will demosntrate ability to perform bed mobility with Mod I. PT Short Term Goal 5 (Week 1): Pt will perform all standing transfers with consistent supervision.  Skilled Therapeutic Interventions/Progress Updates:      Pt presents sitting in wheelchair with her husband present. Pt reports chronic B shoulder pain, unrated, and did not affect her performance in therapy treatment.   Offered a wheelchair transport to gym for energy conservation but patient deferring and requesting to walk. Sit<>stand to RW with CGA with cues for hand placement. She ambulated with CGA and RW ~150' to day room gym. Cues for forward gaze and upright posture.   Pt requesting to work on ambulation without a RW, as this is her goal. Sit<>stand with no AD at [facility] level, ambulates with CGA ~165' around 4W nurses station - similar cues as above, more unsteadiness with turns compared to with RW. No overt LOB.   Administered functional outcome measures for fall risk assessment and balance testing. BERG, TUG, and 5xSTS. See below for details.   Pt ambulated back to her room ~150' with no AD at [facility] level with cues as listed above. Pt fatigued at this point of session, decreased safety approach to w/c with quick turns and flopping into chair. Pt aware she should have taken her time and  sat slowly.   Left with needs met at end.   Therapy Documentation Precautions:  Precautions Precautions: Fall Recall of Precautions/Restrictions: Intact Precaution/Restrictions Comments: L weakness Restrictions Weight Bearing Restrictions Per Provider Order: No General:    Balance: Balance Balance Assessed: Yes Standardized Balance Assessment Standardized Balance Assessment: Berg Balance Test;Timed Up and Go Test Berg Balance Test Sit to Stand: Able to stand without using hands and stabilize independently Standing Unsupported: Able to stand 2 minutes with supervision Sitting with Back Unsupported but Feet Supported on Floor or Stool: Able to sit safely and securely 2 minutes Stand to Sit: Sits safely with minimal use of hands Transfers: Able to transfer with verbal cueing and /or supervision Standing Unsupported with Eyes Closed: Able to stand 10 seconds with supervision Standing Ubsupported with Feet Together: Able to place feet together independently and stand for 1 minute with supervision From Standing, Reach Forward with Outstretched Arm: Can reach forward >12 cm safely (5) From Standing Position, Pick up Object from Floor: Able to pick up shoe, needs supervision From Standing Position, Turn to Look Behind Over each Shoulder: Looks behind one side only/other side shows less weight shift Turn 360 Degrees: Needs close supervision or verbal cueing Standing Unsupported, Alternately Place Feet on Step/Stool: Able to complete 4 steps without aid or supervision Standing Unsupported, One Foot in Front: Able to take small step independently and hold 30 seconds Standing on One Leg: Tries to lift leg/unable to hold 3 seconds but remains standing independently Total Score:  38 Timed Up and Go Test TUG: Normal TUG Normal TUG (seconds): 10.95 5xSTS 13.4s, 10.3s   Therapy/Group: Individual Therapy  Arnol Mcgibbon P Jahn Franchini 03/26/2024, 10:12 AM

## 2024-03-26 NOTE — Plan of Care (Signed)
  Problem: RH BOWEL ELIMINATION Goal: RH STG MANAGE BOWEL WITH ASSISTANCE Description: STG Manage Bowel with mod I Assistance. Outcome: Progressing   Problem: RH SAFETY Goal: RH STG ADHERE TO SAFETY PRECAUTIONS W/ASSISTANCE/DEVICE Description: STG Adhere to Safety Precautions With cues Assistance/Device. Outcome: Progressing   Problem: RH PAIN MANAGEMENT Goal: RH STG PAIN MANAGED AT OR BELOW PT'S PAIN GOAL Description: Pain < 4 with prns Outcome: Progressing

## 2024-03-26 NOTE — Progress Notes (Signed)
 PROGRESS NOTE   Subjective/Complaints:  Bilateral post neck upper back pain, per husband had this PTA but worse since surgery , no radiating pain down the arms, no hand numbness  ROS: +shoulder soreness, denies pain  Objective:   No results found.  Recent Labs    03/25/24 0451 03/26/24 0504  WBC 10.0 11.2*  HGB 8.4* 8.0*  HCT 24.6* 24.1*  PLT 293 379   Recent Labs    03/24/24 0509 03/26/24 0504  NA 134* 138  K 4.0 3.7  CL 102 108  CO2 22 20*  GLUCOSE 113* 111*  BUN 9 10  CREATININE 0.68 0.54  CALCIUM 7.9* 8.1*    Intake/Output Summary (Last 24 hours) at 03/26/2024 1119 Last data filed at 03/26/2024 0735 Gross per 24 hour  Intake 600 ml  Output --  Net 600 ml        Physical Exam: Vital Signs Blood pressure 130/62, pulse 85, temperature 98 F (36.7 C), temperature source Oral, resp. rate 16, height 5' 2 (1.575 m), weight 66.9 kg, SpO2 98%.  General: No acute distress Mood and affect are appropriate Heart: Regular rate and rhythm no rubs murmurs or extra sounds Lungs: Clear to auscultation, breathing unlabored, no rales or wheezes Abdomen: Positive bowel sounds, soft nontender to palpation, nondistended Extremities: No clubbing, cyanosis, or edema Skin: No evidence of breakdown, no evidence of rash, LUE forearm incision with dermabond , + ecchymosis MSK- no shoulder pain , mild upper trap tenderness no pain with neck ROM Spasticity: MAS 0 in all extremities.       Strength:                RUE: 5-/5 SA, 5-/5 EF, 5-/5 EE, 5-/5 WE, 5/5 FF, 5/5 FA                LUE:  5-/5 SA, 5-/5 EF, 5-/5 EE, 5-/5 WE, 5/5 FF, 5/5 FA                RLE: 5/5 HF, 5/5 KE, 5/5  DF, 5/5  EHL, 5/5  PF                 LLE:  4+/5 HF, 4+/5 KE, 5-/5  DF, 5-/5  EHL, 5-/5  PF,   Assessment/Plan: 1. Functional deficits which require 3+ hours per day of interdisciplinary therapy in a comprehensive inpatient rehab  setting. Physiatrist is providing close team supervision and 24 hour management of active medical problems listed below. Physiatrist and rehab team continue to assess barriers to discharge/monitor patient progress toward functional and medical goals  Care Tool:  Bathing    Body parts bathed by patient: Right arm, Left arm, Chest, Abdomen, Front perineal area, Buttocks, Right upper leg, Left upper leg, Right lower leg, Left lower leg, Face (requiring frequent restbreaks and CGA for standing)         Bathing assist Assist Level: Minimal Assistance - Patient > 75%     Upper Body Dressing/Undressing Upper body dressing   What is the patient wearing?: Pull over shirt    Upper body assist Assist Level: Set up assist    Lower Body Dressing/Undressing Lower body dressing  What is the patient wearing?: Underwear/pull up, Pants     Lower body assist Assist for lower body dressing: Set up assist     Toileting Toileting    Toileting assist Assist for toileting: Independent with assistive device     Transfers Chair/bed transfer  Transfers assist     Chair/bed transfer assist level: Minimal Assistance - Patient > 75%     Locomotion Ambulation   Ambulation assist      Assist level: Minimal Assistance - Patient > 75% Assistive device: Walker-rolling Max distance: 90 ft   Walk 10 feet activity   Assist     Assist level: Minimal Assistance - Patient > 75% Assistive device: Walker-rolling   Walk 50 feet activity   Assist    Assist level: Minimal Assistance - Patient > 75% Assistive device: Walker-rolling    Walk 150 feet activity   Assist Walk 150 feet activity did not occur: Safety/medical concerns         Walk 10 feet on uneven surface  activity   Assist Walk 10 feet on uneven surfaces activity did not occur: Safety/medical concerns         Wheelchair     Assist Is the patient using a wheelchair?: Yes (will use for convenience  while in rehab, not expecte to use upon d/c home) Type of Wheelchair: Manual    Wheelchair assist level: Total Assistance - Patient < 25% Max wheelchair distance: 200 ft    Wheelchair 50 feet with 2 turns activity    Assist        Assist Level: Total Assistance - Patient < 25%   Wheelchair 150 feet activity     Assist      Assist Level: Total Assistance - Patient < 25%   Blood pressure 130/62, pulse 85, temperature 98 F (36.7 C), temperature source Oral, resp. rate 16, height 5' 2 (1.575 m), weight 66.9 kg, SpO2 98%.  Medical Problem List and Plan: 1. Functional deficits secondary to Multifocal CVAs s/p TEVAR with subclavian branch endoprosthesis              -patient may shower             -ELOS/Goals: 10 to 12 days, supervision PT/OT             - On admission assessment, patient with cognitive and language deficits necessitating SLP evaluation; ordered,              -Continue CIR   2.  Antithrombotics: -DVT/anticoagulation:  Mechanical: Sequential compression devices, below knee Bilateral lower extremities Pharmaceutical: Other (comment) Paradaxa 150 mg BID              -antiplatelet therapy: Aspirin 81 mg    3. Pain Management: On home Flexeril  10 mg. Oxycodone and tylenol  prn    4. Mood/Behavior/Sleep: LCSW to follow for evaluation and support when available.              -antipsychotic agents: n/a   5. Neuropsych/cognition: This patient may be intermittently capable of making decisions on her own behalf.   6. Skin/Wound Care: Routine pressure relief measures.    7. Fluids/Electrolytes/Nutrition: Monitor I&O and weight. Follow up labs CBC/CMP               GERD: Protonix 40 mg daily, using prn Pepcid     8. Status post TEVAR with subclavian branch endoprosthesis: continue Pradaxa  and Asprin    9. Stroke: MRI  showed Small acute infarcts in the  bilateral frontal lobes, left occipital lobe, and left cerebellum. 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   Recommended resuming simvastatin  and follow up with outpatient neurology [facility].   - Simvastatin  10 mg daily resumed on inpatient rehab admission.      10.HLD: continue Simvastatin  10 mg    11. Shortness of Breath: Chest x-ray demonstrated a stable small area of atelectasis within the left lung.  O2 sats 96-99% room air             - continue Incentive spirometer   12.  Tachycardia: resolved and patient is hypotensive, d/c toprol  XL   13.  Constipation.  LBM 11/8. Magnesium level reviewed and was 1.8 on 11//3, will add on level today.              -Continue senokot-S 1 tab twice daily and daily MiraLAX   14. Anemia: Hgb stable but still low contributing to poor endurance     Latest Ref Rng & Units 03/26/2024    5:04 AM 03/25/2024    4:51 AM 03/24/2024    5:09 AM  CBC  WBC 4.0 - 10.5 K/uL 11.2  10.0  10.9   Hemoglobin 12.0 - 15.0 g/dL 8.0  8.4  8.2   Hematocrit 36.0 - 46.0 % 24.1  24.6  23.8   Platelets 150 - 400 K/uL 379  293  250     LOS: 3 days A FACE TO FACE EVALUATION WAS PERFORMED  Prentice BRAVO Romari Gasparro 03/26/2024, 11:19 AM

## 2024-03-26 NOTE — Progress Notes (Signed)
 Occupational Therapy Session Note  Patient Details  Name: Terri Alexander MRN: [id number] Date of Birth: 04-Mar-1951  Today's Date: 03/26/2024 OT Individual Time: 8697-8584 OT Individual Time Calculation (min): 73 min    Short Term Goals: Week 1:  OT Short Term Goal 1 (Week 1): The pt will safety complete all functional t/f's to all surfaces with ModI. OT Short Term Goal 2 (Week 1): The pt will safely bathe UB/LB with ModI at incorporating AE as needed. OT Short Term Goal 3 (Week 1): The ppt will dress UB/LB with ModI incorporating AE as needed. OT Short Term Goal 4 (Week 1): The pt will safely tolerate > 30 minutes of OT acivity requiring  < 4 rest breaks. OT Short Term Goal 5 (Week 1): The pt will safely complete a simple home making task requiring < 3 rest breaks.  Skilled Therapeutic Interventions/Progress Updates:     Pt received sitting up in wc with husband present in room. Pt presenting to be in good spirits receptive to skilled OT session reporting 3/10 pain in shoulders d/t chronic muscle stiffness- OT offering intermittent rest breaks, repositioning, and therapeutic support to optimize participation in therapy session. Pt requesting to take shower. Focused this session on ADL retraining with emphasis on activity tolerance, safety awareness, and increasing overall independence. She completed ambulatory transfers throughout session no AD with CGA-MIN A required- mild increased lateral weight shifting and intermittent LOB present, able to correct with MIN A. Toileting completed on BSC positioned over toilet with CGA provided for safety. Doffed OH shirt seated with SUP and pants CGA. Ambulatory transfer to TTB positioned in walk-in shower CGA using grab bars for balance. Pt sat for majority of bathing tasks for energy conservation, however able to maintain standing balance when rinsing hair and buttocks with close SUP using grab bar for balance. Appropriate safety awareness noted during  shower. Pt able to dry self following with close SUP-CGA for safety and balance. U/LB dressing completed sitting EOB donning OH shirt with set-up A and LB dressing close SUP provided for safety-mild posterior lean noted, however no LOB. Stand pivot EOB > wc to RW CGA. Completed grooming/hygiene tasks blow drying her hair and brushing teeth with SUP- min verbal cues required to initiate rest break.  Transported Pt total A to therapy gym in wc for time management. Engaged Pt in completing series of visual scanning and dynamic reaching activities on BITs while standing on compliant surface no AD to work on reactionary balance, anterior/posterior weight shifting, and correcting balance after reaching outside BOS. She was able to complete 3 activities with CGA-MIN A for balance and seated rest breaks provided between each activity. No significant LOB noted, however Pt did report she felt like she was having difficulty weight shifting to maintain her balance. She completed functional mobility back to her room ~150 ft no AD with CGA-MIN A provided for balance and safety- intermittent scissoring noted, however Pt aware of this and able to correct with verbal cues. Pt was left resting in recliner with call bell in reach, chair alarm on, and all needs met.    Therapy Documentation Precautions:  Precautions Precautions: Fall Recall of Precautions/Restrictions: Intact Precaution/Restrictions Comments: L weakness Restrictions Weight Bearing Restrictions Per Provider Order: No   Therapy/Group: Individual Therapy  Katheryn SHAUNNA [place] 03/26/2024, 1:24 PM

## 2024-03-26 NOTE — Progress Notes (Signed)
  Progress Note    03/26/2024 12:36 PM * No surgery found *  Subjective: feeling good. Happy with how she is progressing    Vitals:   03/25/24 1955 03/26/24 0434  BP: (!) 120/53 130/62  Pulse: 88 85  Resp: 18 16  Temp: 98.2 F (36.8 C) 98 F (36.7 C)  SpO2: 97% 98%   Physical Exam: Cardiac: normal rate Lungs:  non labored on RA Incisions:  groin incisions c/d/I; L arm incision with bruising but no hematoma Extremities:  palpable L ulnar; brisk palmar arch by doppler; palpable DP pulses; moving all ext well Neurologic: A&O  CBC    Component Value Date/Time   WBC 11.2 (H) 03/26/2024 0504   RBC 2.71 (L) 03/26/2024 0504   HGB 8.0 (L) 03/26/2024 0504   HGB 13.6 11/09/2023 0842   HCT 24.1 (L) 03/26/2024 0504   HCT 42.7 11/09/2023 0842   PLT 379 03/26/2024 0504   PLT 335 11/09/2023 0842   MCV 88.9 03/26/2024 0504   MCV 94 11/09/2023 0842   MCH 29.5 03/26/2024 0504   MCHC 33.2 03/26/2024 0504   RDW 15.2 03/26/2024 0504   RDW 12.6 11/09/2023 0842   LYMPHSABS 1.4 03/25/2024 0451   MONOABS 1.2 (H) 03/25/2024 0451   EOSABS 0.0 03/25/2024 0451   BASOSABS 0.0 03/25/2024 0451    BMET    Component Value Date/Time   NA 138 03/26/2024 0504   NA 139 11/09/2023 0841   K 3.7 03/26/2024 0504   CL 108 03/26/2024 0504   CO2 20 (L) 03/26/2024 0504   GLUCOSE 111 (H) 03/26/2024 0504   BUN 10 03/26/2024 0504   BUN 21 11/09/2023 0841   CREATININE 0.54 03/26/2024 0504   CREATININE 1.13 (H) 10/03/2023 0956   CALCIUM 8.1 (L) 03/26/2024 0504   GFRNONAA >60 03/26/2024 0504   GFRNONAA 88 09/18/2019 1039   GFRAA 102 09/18/2019 1039    INR    Component Value Date/Time   INR 1.3 (H) 03/19/2024 1700     Intake/Output Summary (Last 24 hours) at 03/26/2024 1236 Last data filed at 03/26/2024 0735 Gross per 24 hour  Intake 600 ml  Output --  Net 600 ml     Assessment/Plan:  73 y.o. female is s/p TEVAR with subclavian branch endoprosthesis   I am very happy with how Terri Alexander  is doing.  I saw her ambulate, and this is significantly improved from last week. Palpable in all extremities. Will continue to follow.  No plans for further intervention.  Appreciate all of the care and hard work from geophysicist/field seismologist.   [name] MD [facility] Phone Number: [phone] 03/26/2024 12:36 PM

## 2024-03-26 NOTE — IPOC Note (Signed)
 Overall Plan of Care [facility]) Patient Details Name: Terri Alexander MRN: [id number] DOB: 31-Mar-1951  Admitting Diagnosis: CVA (cerebral vascular accident) [facility])  Hospital Problems: Principal Problem:   CVA (cerebral vascular accident) [facility])     Functional Problem List: Nursing Safety, Bowel, Endurance, Medication Management, Pain  PT Balance, Edema, Endurance, Motor, Safety, Perception  OT Balance, Endurance, Motor  SLP    TR         Basic ADL's: OT Toileting, Bathing     Advanced  ADL's: OT Simple Meal Preparation, Light Housekeeping     Transfers: PT Bed Mobility, Bed to Chair, Set Designer, [employer]  OT Tub/Shower     Locomotion: PT Ambulation, Stairs     Additional Impairments: OT    SLP None      TR      Anticipated Outcomes Item Anticipated Outcome  Self Feeding Ind  Swallowing  n/a   Basic self-care  Ind  Toileting  Ind   Bathroom Transfers Ind  Bowel/Bladder  manage bowel w mod I assist  Transfers  Mod I  Locomotion  Mod I/ supervision  Communication  n/a  Cognition  n/a  Pain  Pain < 4 with prns  Safety/Judgment  manage safety w cues   Therapy Plan: PT Intensity: Minimum of 1-2 x/day ,45 to 90 minutes PT Frequency: 5 out of 7 days PT Duration Estimated Length of Stay: 14-16 days OT Intensity: Minimum of 1-2 x/day, 45 to 90 minutes OT Frequency: 5 out of 7 days OT Duration/Estimated Length of Stay: 2wks SLP Intensity:  (d/c SLP therapy) SLP Frequency:  (d/c SLP therapy) SLP Duration/Estimated Length of Stay: n/a   Team Interventions: Nursing Interventions Patient/Family Education, Medication Management, Pain Management, Bowel Management, Disease Management/Prevention, Discharge Planning  PT interventions Ambulation/gait training, Community reintegration, DME/adaptive equipment instruction, Neuromuscular re-education, Psychosocial support, Stair training, UE/LE Strength taining/ROM, Warden/ranger, Discharge planning, Functional  electrical stimulation, Pain management, Skin care/wound management, Therapeutic Activities, UE/LE Coordination activities, Visual/perceptual remediation/compensation, Therapeutic Exercise, Splinting/orthotics, Patient/family education, Functional mobility training, Disease management/prevention, Cognitive remediation/compensation  OT Interventions Self Care/advanced ADL retraining, Therapeutic Activities, UE/LE Coordination activities, Therapeutic Exercise, Functional mobility training, UE/LE Strength taining/ROM, Neuromuscular re-education, Balance/vestibular training  SLP Interventions  (d/c SLP therapy)  TR Interventions    SW/CM Interventions Discharge Planning, Psychosocial Support, Patient/Family Education   Barriers to Discharge MD  Medical stability and Home enviroment access/loayout  Nursing Decreased caregiver support 1 level 1 ste w spouse  PT Inaccessible home environment, Decreased caregiver support    OT None    SLP      SW       Team Discharge Planning: Destination: PT-Home ,OT- Home , SLP-Home Projected Follow-up: PT-Home health PT, 24 hour supervision/assistance, OT-  None, SLP-None Projected Equipment Needs: PT-To be determined, OT- 3 in 1 bedside comode, SLP-None recommended by SLP Equipment Details: PT- , OT-  Patient/family involved in discharge planning: PT- Patient, Family member/caregiver,  OT-Patient, Family member/caregiver, SLP-Patient, Family member/caregiver  MD ELOS: 7-10d Medical Rehab Prognosis:  Excellent Assessment: The patient has been admitted for CIR therapies with the diagnosis of CVA. The team will be addressing functional mobility, strength, stamina, balance, safety, adaptive techniques and equipment, self-care, bowel and bladder mgt, patient and caregiver education, anemia and fatigue . Goals have been set at mod I. Anticipated discharge destination is Home .        See Team Conference Notes for weekly updates to the plan of care

## 2024-03-26 NOTE — Progress Notes (Addendum)
 Patient ID: Terri Alexander, female   DOB: 11/22/1950, 73 y.o.   MRN: [id number] Met with the patient and spouse to review current medical situation, rehab process, team conference and plan of care. Patient reports doing well overall except for coughing and shoulder pain. New orders for Voltaren gel.  Has cough medication and chloraseptic spray prn. Discussed medications and dietary modification for HH diet and Toprol  discontinued due to tachycardia and hypotension per MD.  Continue to follow along to address educational needs to facilitate preparation for discharge. Fredericka Barnie NOVAK

## 2024-03-26 NOTE — Plan of Care (Signed)
  Problem: Consults Goal: RH STROKE PATIENT EDUCATION Description: See Patient Education module for education specifics  Outcome: Progressing Goal: Nutrition Consult-if indicated Outcome: Progressing   Problem: RH BOWEL ELIMINATION Goal: RH STG MANAGE BOWEL WITH ASSISTANCE Description: STG Manage Bowel with mod I Assistance. Outcome: Progressing Goal: RH STG MANAGE BOWEL W/MEDICATION W/ASSISTANCE Description: STG Manage Bowel with Medication with mod I  Assistance. Outcome: Progressing   Problem: RH SAFETY Goal: RH STG ADHERE TO SAFETY PRECAUTIONS W/ASSISTANCE/DEVICE Description: STG Adhere to Safety Precautions With cues Assistance/Device. Outcome: Progressing   Problem: RH PAIN MANAGEMENT Goal: RH STG PAIN MANAGED AT OR BELOW PT'S PAIN GOAL Description: Pain < 4 with prns Outcome: Progressing   Problem: RH KNOWLEDGE DEFICIT Goal: RH STG INCREASE KNOWLEGDE OF HYPERLIPIDEMIA Description: Patient and spouse will be able to manage HLD using educational resources for medications and dietary modification independently Outcome: Progressing Goal: RH STG INCREASE KNOWLEDGE OF STROKE PROPHYLAXIS Description: Patient and spouse will be able to manage secondary risks using educational resources for medications and dietary modification independently Outcome: Progressing

## 2024-03-26 NOTE — Progress Notes (Addendum)
 Inpatient Rehabilitation Care Coordinator Assessment and Plan Patient Details  Name: Terri Alexander MRN: [id number] Date of Birth: 10/29/1950  Today's Date: 03/26/2024  Hospital Problems: Principal Problem:   CVA (cerebral vascular accident) [facility])  Past Medical History:  Past Medical History:  Diagnosis Date   Adenomatous colon polyp    Allergy    Anemia    as a child, no current problem   Arrhythmia    Atrial fibrillation (HCC)    Blood transfusion without reported diagnosis @ 9 months   9 months old   Cataract    bilateral - MD just watching    Dysrhythmia    A. Fib   GERD (gastroesophageal reflux disease)    History of hiatal hernia    Hyperlipidemia    Osteoporosis 04/15/2016   Peripheral vascular disease    Type B Aortic Dissection   Skin cancer 2013   Skin cancer- Nose, face   Past Surgical History:  Past Surgical History:  Procedure Laterality Date   ANGIOPLASTY, ARTERY, SUBCLAVIAN, ENDOVASCULAR, WITH STENT INSERTION Left 03/19/2024   Procedure: LEFT AXILLARY ARTERY STENT INSERTION;  Surgeon: [name], MD;  Location: [facility] OR;  Service: Vascular;  Laterality: Left;   ATRIAL FIBRILLATION ABLATION N/A 02/16/2023   Procedure: ATRIAL FIBRILLATION ABLATION;  Surgeon: [name], MD;  Location: [facility] INVASIVE CV LAB;  Service: Cardiovascular;  Laterality: N/A;   CARDIOVERSION N/A 09/09/2022   Procedure: CARDIOVERSION;  Surgeon: [name], MD;  Location: [facility] INVASIVE CV LAB;  Service: Cardiovascular;  Laterality: N/A;   CESAREAN SECTION  1981   x 1   CESAREAN SECTION     COLONOSCOPY  2016   hx polyp, tics, sigmoid colon mod tortuous Dr [name],    EMBOLECTOMY Left 03/19/2024   Procedure: EMBOLECTOMY OF LEFT BRACHIAL ARTERY;  Surgeon: [name], MD;  Location: [facility] OR;  Service: Vascular;  Laterality: Left;   EYE SURGERY Bilateral    retina repair x 2   FEMORAL ARTERY EXPLORATION Left 03/19/2024   Procedure: EXPOSURE OF BRACHIAL ARTERY WITH STENT  INSERTION;  Surgeon: [name], MD;  Location: [facility] OR;  Service: Vascular;  Laterality: Left;   LAPAROTOMY  1979   removal of ovarian cyst   nissan fundiplication  05/17/1997   REPAIR OF COMPLEX TRACTION RETINAL DETACHMENT Bilateral 01/2019   SVT ABLATION N/A 12/09/2023   Procedure: SVT ABLATION;  Surgeon: [name], MD;  Location: [facility] INVASIVE CV LAB;  Service: Cardiovascular;  Laterality: N/A;   THORACIC AORTIC ENDOVASCULAR STENT GRAFT Left 03/19/2024   Procedure: INSERTION, ENDOVASCULAR STENT GRAFT, AORTA, THORACIC; STENTING X2 OF LEFT SUBCLAVIAN ARTERY;  Surgeon: [name], MD;  Location: [facility] OR;  Service: Vascular;  Laterality: Left;   ULTRASOUND GUIDANCE FOR VASCULAR ACCESS Bilateral 03/19/2024   Procedure: ULTRASOUND GUIDANCE, FOR VASCULAR ACCESS OF BILATERAL FEMORAL AND LEFT RADIAL ARTERY;  Surgeon: [name], MD;  Location: [facility] OR;  Service: Vascular;  Laterality: Bilateral;   Social History:  reports that she has never smoked. She has never used smokeless tobacco. She reports current alcohol use of about 1.0 standard drink of alcohol per week. She reports that she does not use drugs.  Family / Support Systems Marital Status: Married Patient Roles: Spouse, Parent, Other (Comment) (grandparent) Spouse/Significant Other: [name] [phone] Children: Wendie [phone] Other Supports: DIL and grandchildren Anticipated Caregiver: Husband Ability/Limitations of Caregiver: None-in good health and can provide assist if needed Caregiver Availability: 24/7 Family Dynamics: Close with family thye moved  from [place] to be closer to son's family and grandchildren. They do have a few friends, but son and his family are their main supports  Social History Preferred language: English Religion: Methodist Cultural Background: NA Education: Charity Fundraiser - How often do you need to have someone help you when you read instructions, pamphlets, or other written  material from your doctor or pharmacy?: Never Writes: Yes Employment Status: Retired Marine Scientist Issues: No issues Guardian/Conservator: None-according to MD pt is capable of making her own decisions while here. Husband is present and supportive   Abuse/Neglect Abuse/Neglect Assessment Can Be Completed: Yes Physical Abuse: Denies Verbal Abuse: Denies Sexual Abuse: Denies Exploitation of patient/patient's resources: Denies Self-Neglect: Denies  Patient response to: Social Isolation - How often do you feel lonely or isolated from those around you?: Never  Emotional Status Pt's affect, behavior and adjustment status: Pt is motivated to do well and recover from this surgery and now CVA. She has always been independent and and took care of herself prior to admission. Both pt and husband are active. Recent Psychosocial Issues: other health issues-working on coordination/balance from this stroke Psychiatric History: No history/issues seems to be doing well and recovering from her surgery and stroke. She is able to verbalize her concerns and questions Substance Abuse History: NA  Patient / Family Perceptions, Expectations & Goals Pt/Family understanding of illness & functional limitations: Pt and husband can explain her surgery and CVA post op-she is hoping to see Dr Silver today since he said he would. Both seem to have a good understanding of her plan moving forward and hopes to be a short length of stay here Premorbid pt/family roles/activities: wife, retiree, mom, grandmother, etc Anticipated changes in roles/activities/participation: resume Pt/family expectations/goals: Pt states:  I hope to do well and not have to be here long. I really want to take a shower.  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  Community Resources [employer]: None Premorbid Home Care/DME Agencies: None Transportation available at discharge: husband Is the  patient able to respond to transportation needs?: Yes In the past 12 months, has lack of transportation kept you from medical appointments or from getting medications?: No In the past 12 months, has lack of transportation kept you from meetings, work, or from getting things needed for daily living?: No  Discharge Planning Living Arrangements: Spouse/significant other Support Systems: Spouse/significant other, Children, Other relatives, Friends/neighbors Type of Residence: Private residence Insurance Resources: Electrical Engineer Resources: Social Security, Family Support Financial Screen Referred: No Living Expenses: Own Money Management: Patient, Spouse Does the patient have any problems obtaining your medications?: No Home Management: both pt and husband Patient/Family Preliminary Plans: Return home with husband who is able to assist if needed. He is here and provides support. Aware goals are set at mod/i-supervision level. Will work on discharge needs and message OT-Lori regarding her wanting to take a shower today.  Clinical Impression Pleasant female who is motivated to do well and be able to go home soon. Her husband is present and is very supportive. Will work on discharge needs. Pt concerned does not have PT session on her schedule have messaged Ben-Therapy supervisor regarding this  Raymonde Asberry MATSU 03/26/2024, 9:42 AM

## 2024-03-26 NOTE — Progress Notes (Signed)
 Inpatient Rehabilitation  Patient information reviewed and entered into eRehab system by [facility]. Karen Kays., CCC/SLP, PPS Coordinator.  Information including medical coding, functional ability and quality indicators will be reviewed and updated through discharge.

## 2024-03-27 DIAGNOSIS — M7918 Myalgia, other site: Secondary | ICD-10-CM | POA: Diagnosis not present

## 2024-03-27 DIAGNOSIS — I6310 Cerebral infarction due to embolism of unspecified precerebral artery: Secondary | ICD-10-CM | POA: Diagnosis not present

## 2024-03-27 DIAGNOSIS — D62 Acute posthemorrhagic anemia: Secondary | ICD-10-CM | POA: Diagnosis not present

## 2024-03-27 MED ORDER — FAMOTIDINE 20 MG PO TABS
10.0000 mg | ORAL_TABLET | Freq: Two times a day (BID) | ORAL | Status: DC
  Administered 2024-03-27 – 2024-03-30 (×7): 10 mg via ORAL
  Filled 2024-03-27 (×7): qty 1

## 2024-03-27 NOTE — Progress Notes (Signed)
 Physical Therapy Session Note  Patient Details  Name: Terri Alexander MRN: [id number] Date of Birth: 1950/12/28  Today's Date: 03/27/2024 PT Individual Time: 8484-8395 PT Individual Time Calculation (min): 49 min   Short Term Goals: Week 1:  PT Short Term Goal 1 (Week 1): Pt will demonstrate improved quality of gait with less restrictive AD than RW and overall CGA/ SBA. PT Short Term Goal 2 (Week 1): Pt will ambulate at least 150 ft using less restrictive AD than RW with overall CGA/ SBA. PT Short Term Goal 3 (Week 1): Pt will perform standing balance activities with no UE support and reduced posterior LOB. PT Short Term Goal 4 (Week 1): Pt will demosntrate ability to perform bed mobility with Mod I. PT Short Term Goal 5 (Week 1): Pt will perform all standing transfers with consistent supervision.  Skilled Therapeutic Interventions/Progress Updates:  Patient ambulating with RW and Terri Alexander providing supervision on entrance to room. Returning from bathroom. Patient alert and agreeable to PT session.   Patient with no pain complaint at start of session.  Therapeutic Activity: Transfers: Pt performed sit<>stand and stand pivot transfers throughout session with supervision. Provided vc/ tc for improved technique and focus to performance for improved balance upon stance. However significant improvement noted since [facility] with reduced need for BLE extension into seated surface.  Gait Training:  Pt ambulated throughout department completing bouts of at least 300 ft using RW and without AD with CGA. Demonstrated intermittent missteps and crossover steps d/t reduced balance.  Provided vc/ tc for maintaining higher level focus to LLE and placement/ positioning and hold of musculature from proximal to distal to increase stability in balance.   Pt guided in Functional Gait Assessment. She demonstrates moderate  fall risk as noted by score of 20/30 on  Functional Gait Assessment.   <22/30 = predictive of  falls, <20/30 = fall in 6 months, <18/30 = predictive of falls in PD MCID: 5 points stroke population, 4 points geriatric population (ANPTA Core Set of Outcome Measures for Adults with Neurologic Conditions, 2018)   Neuromuscular Re-ed: Prior to NMR, pt guided in seated low back stretch using large theraball with forearm placement on ball and roll out. Pt is able to note stretch/ pull to areas of stenting in LUE and adequately adjusts to limit ROM to pain-free ranges. Good stretch noted to low back.  Related that this can be done with hands to ottoman which pt has at home.   NMR facilitated during session with focus on dynamic standing balance. Pt guided in obstacle course of A[employer] balance beam performed fwd and then laterally, stepping over 6 hurdles, ambulating up moderate ramped surface with pause for gastrocsoleus stretch, ambulation/ stance on cobbleboards, stand on Airex with 1# weighted ball performing AP arm extensions and then pot-stirrers CW/ CCW x10. Pt's largest difficulty with with pliant surfaces and more narrow BOS items espcially balance beam and ambulation up/ down ramped wedge.   NMR performed for improvements in motor control and coordination, balance, sequencing, judgement, and self confidence/ efficacy in performing all aspects of mobility at highest level of independence.   Patient seated upright in recliner at end of session with brakes locked, no alarm set as Terri Alexander present and providing supervision, and all needs within reach.   Therapy Documentation Precautions:  Precautions Precautions: Fall Recall of Precautions/Restrictions: Intact Precaution/Restrictions Comments: L weakness Restrictions Weight Bearing Restrictions Per Provider Order: No  Pain: Pain Assessment Pain Scale: 0-10 Pain Score: 5  Pain Location: Shoulder  Pain Intervention(s): Medication (See eMAR)  Balance: Balance Balance Assessed: Yes Standardized Balance Assessment Standardized Balance  Assessment: Functional Gait Assessment Functional Gait  Assessment Gait assessed : Yes Gait Level Surface: Walks 20 ft in less than 7 sec but greater than 5.5 sec, uses assistive device, slower speed, mild gait deviations, or deviates 6-10 in outside of the 12 in walkway width. Change in Gait Speed: Makes only minor adjustments to walking speed, or accomplishes a change in speed with significant gait deviations, deviates 10-15 in outside the 12 in walkway width, or changes speed but loses balance but is able to recover and continue walking. (increased difficulty noted with slower pace) Gait with Horizontal Head Turns: Performs head turns smoothly with slight change in gait velocity (eg, minor disruption to smooth gait path), deviates 6-10 in outside 12 in walkway width, or uses an assistive device. Gait with Vertical Head Turns: Performs head turns with no change in gait. Deviates no more than 6 in outside 12 in walkway width. Gait and Pivot Turn: Pivot turns safely in greater than 3 sec and stops with no loss of balance, or pivot turns safely within 3 sec and stops with mild imbalance, requires small steps to catch balance. Step Over Obstacle: Is able to step over one shoe box (4.5 in total height) without changing gait speed. No evidence of imbalance. Gait with Narrow Base of Support: Ambulates 7-9 steps. Gait with Eyes Closed: Walks 20 ft, uses assistive device, slower speed, mild gait deviations, deviates 6-10 in outside 12 in walkway width. Ambulates 20 ft in less than 9 sec but greater than 7 sec. Ambulating Backwards: Walks 20 ft, uses assistive device, slower speed, mild gait deviations, deviates 6-10 in outside 12 in walkway width. Steps: Alternating feet, must use rail. Total Score: 20 FGA comment:: Medium Fall Risk    Therapy/Group: Individual Therapy  Terri Alexander PT, DPT, CSRS 03/27/2024, 5:13 PM

## 2024-03-27 NOTE — Progress Notes (Signed)
 Occupational Therapy Session Note  Patient Details  Name: Terri Alexander MRN: [id number] Date of Birth: 1951-03-12  Today's Date: 03/27/2024 OT Individual Time: 8696-8585 OT Individual Time Calculation (min): 71 min    Short Term Goals: Week 1:  OT Short Term Goal 1 (Week 1): The pt will safety complete all functional t/f's to all surfaces with ModI. OT Short Term Goal 2 (Week 1): The pt will safely bathe UB/LB with ModI at incorporating AE as needed. OT Short Term Goal 3 (Week 1): The ppt will dress UB/LB with ModI incorporating AE as needed. OT Short Term Goal 4 (Week 1): The pt will safely tolerate > 30 minutes of OT acivity requiring  < 4 rest breaks. OT Short Term Goal 5 (Week 1): The pt will safely complete a simple home making task requiring < 3 rest breaks.  Skilled Therapeutic Interventions/Progress Updates:     Pt received sitting up in recliner, dressed and ready for the day with husband present in room. Pt presenting to be in good spirits receptive to skilled OT session reporting 0/10 pain, however fatigued 2/2 full day of therapy- OT offering intermittent rest breaks, repositioning, and therapeutic support to optimize participation in therapy session. Pt's husband present and participating throughout session.   Functional mobility to therapy gym completed with CGA with single LOB noted- Pt able to self correct demonstrating improved stepping strategies and improved body awareness.   U/LB endurance training completed on NuStep to increase overall activity tolerance for ADLs and IADLs. She completed 2 bouts of 5 min with vitals assessed following each bout and short rest breaks provided. Pt noted to be SOB, however SPO2 remained >97% throughout exercise and breath rate improved with PLB'ing. HR 100 BPM following first bout and 105 following second bout.   Dynamic stepping balance activity completed without AD to challenge Pt's single leg balance, body awareness, and motor control.  Pt tasked with stepping in square pattern over cones and side stepping laterally over cones completing 5 trials of each alternating leading with R/L LE. She required CGA-MIN A overall for balance with increased challenges noted with increased challenges noted leading with L>R.   Seated B UE exercises completing with 3# weighted dowel to increase strength for ADLs. Pt completed 2x10 reps of the following exercise with short rest breaks provided between each exercise:   -Bicep Curls  -Chest Press  -Overhead press  -Anterior shoulder flexion  Pt completed dynamic balance dual tasking ball toss activity with forwards and backwards walking incorporated into activity to work on dual tasking, coordination, and reactionary balance. Pt completed 2 trials with CGA-MIN A with increased challenges noted when walking backwards d/t increased posterior lean and decreased confidence in this position.   Ended session by engaging Pt in shag dancing activity with her husband to work on activity tolerance, dynamic balance, coordination, and to improve moral. Pt was able to complete total of 4 min with B UE support from husband and close SUP from OT. Pt with SOB following activity, respiratory rate improved with seated rest break and PLB'ing.   Pt was left resting in recliner with call bell in reach, chair alarm on, and all needs met.    Therapy Documentation Precautions:  Precautions Precautions: Fall Recall of Precautions/Restrictions: Intact Precaution/Restrictions Comments: L weakness Restrictions Weight Bearing Restrictions Per Provider Order: No   Therapy/Group: Individual Therapy  Katheryn SHAUNNA [place] 03/27/2024, 3:15 PM

## 2024-03-27 NOTE — Progress Notes (Signed)
 PROGRESS NOTE   Subjective/Complaints:  No issues overnite but feels food gets caught in epigastric area, took prn omeprazole at home , we discussed possiblitiy of esophageal dysmotility and further w/u with UGI , pt declinse at this time Also c/o multiple loose BM   ROS: +shoulder soreness, denies pain  Objective:   No results found.  Recent Labs    03/25/24 0451 03/26/24 0504  WBC 10.0 11.2*  HGB 8.4* 8.0*  HCT 24.6* 24.1*  PLT 293 379   Recent Labs    03/26/24 0504  NA 138  K 3.7  CL 108  CO2 20*  GLUCOSE 111*  BUN 10  CREATININE 0.54  CALCIUM 8.1*    Intake/Output Summary (Last 24 hours) at 03/27/2024 0834 Last data filed at 03/27/2024 0727 Gross per 24 hour  Intake 456 ml  Output --  Net 456 ml        Physical Exam: Vital Signs Blood pressure (!) 148/68, pulse 91, temperature 98.9 F (37.2 C), temperature source Oral, resp. rate 18, height 5' 2 (1.575 m), weight 66.9 kg, SpO2 95%.  General: No acute distress Mood and affect are appropriate Heart: Regular rate and rhythm no rubs murmurs or extra sounds Lungs: Clear to auscultation, breathing unlabored, no rales or wheezes Abdomen: Positive bowel sounds, soft nontender to palpation, nondistended Extremities: No clubbing, cyanosis, or edema Skin: No evidence of breakdown, no evidence of rash, LUE forearm incision with dermabond , + ecchymosis MSK- no shoulder pain , mild upper trap tenderness no pain with neck ROM Spasticity: MAS 0 in all extremities.       Strength:                RUE: 5-/5 SA, 5-/5 EF, 5-/5 EE, 5-/5 WE, 5/5 FF, 5/5 FA                LUE:  5-/5 SA, 5-/5 EF, 5-/5 EE, 5-/5 WE, 5/5 FF, 5/5 FA                RLE: 5/5 HF, 5/5 KE, 5/5  DF, 5/5  EHL, 5/5  PF                 LLE:  4+/5 HF, 4+/5 KE, 5-/5  DF, 5-/5  EHL, 5-/5  PF,   Assessment/Plan: 1. Functional deficits which require 3+ hours per day of interdisciplinary therapy in  a comprehensive inpatient rehab setting. Physiatrist is providing close team supervision and 24 hour management of active medical problems listed below. Physiatrist and rehab team continue to assess barriers to discharge/monitor patient progress toward functional and medical goals  Care Tool:  Bathing    Body parts bathed by patient: Right arm, Left arm, Chest, Abdomen, Front perineal area, Buttocks, Right upper leg, Left upper leg, Right lower leg, Left lower leg, Face (requiring frequent restbreaks and CGA for standing)         Bathing assist Assist Level: Minimal Assistance - Patient > 75%     Upper Body Dressing/Undressing Upper body dressing   What is the patient wearing?: Pull over shirt    Upper body assist Assist Level: Set up assist  Lower Body Dressing/Undressing Lower body dressing      What is the patient wearing?: Underwear/pull up, Pants     Lower body assist Assist for lower body dressing: Set up assist     Toileting Toileting    Toileting assist Assist for toileting: Independent with assistive device     Transfers Chair/bed transfer  Transfers assist     Chair/bed transfer assist level: Minimal Assistance - Patient > 75%     Locomotion Ambulation   Ambulation assist      Assist level: Minimal Assistance - Patient > 75% Assistive device: Walker-rolling Max distance: 90 ft   Walk 10 feet activity   Assist     Assist level: Minimal Assistance - Patient > 75% Assistive device: Walker-rolling   Walk 50 feet activity   Assist    Assist level: Minimal Assistance - Patient > 75% Assistive device: Walker-rolling    Walk 150 feet activity   Assist Walk 150 feet activity did not occur: Safety/medical concerns         Walk 10 feet on uneven surface  activity   Assist Walk 10 feet on uneven surfaces activity did not occur: Safety/medical concerns         Wheelchair     Assist Is the patient using a wheelchair?:  Yes (will use for convenience while in rehab, not expecte to use upon d/c home) Type of Wheelchair: Manual    Wheelchair assist level: Total Assistance - Patient < 25% Max wheelchair distance: 200 ft    Wheelchair 50 feet with 2 turns activity    Assist        Assist Level: Total Assistance - Patient < 25%   Wheelchair 150 feet activity     Assist      Assist Level: Total Assistance - Patient < 25%   Blood pressure (!) 148/68, pulse 91, temperature 98.9 F (37.2 C), temperature source Oral, resp. rate 18, height 5' 2 (1.575 m), weight 66.9 kg, SpO2 95%.  Medical Problem List and Plan: 1. Functional deficits secondary to Multifocal CVAs s/p TEVAR with subclavian branch endoprosthesis              -patient may shower             -ELOS/Goals: 10 to 12 days, supervision PT/OT             - On admission assessment, patient with cognitive and language deficits necessitating SLP evaluation; ordered,              -Continue CIR, team conf in am    2.  Antithrombotics: -DVT/anticoagulation:  Mechanical: Sequential compression devices, below knee Bilateral lower extremities Pharmaceutical: Other (comment) Paradaxa 150 mg BID              -antiplatelet therapy: Aspirin 81 mg    3. Pain Management: On home Flexeril  10 mg. Oxycodone and tylenol  prn    4. Mood/Behavior/Sleep: LCSW to follow for evaluation and support when available.              -antipsychotic agents: n/a   5. Neuropsych/cognition: This patient may be intermittently capable of making decisions on her own behalf.   6. Skin/Wound Care: Routine pressure relief measures.    7. Fluids/Electrolytes/Nutrition: Monitor I&O and weight. Follow up labs CBC/CMP               GERD: Protonix 40 mg daily, using prn Pepcid     8. Status post TEVAR with subclavian branch endoprosthesis:  continue Pradaxa  and Asprin    9. Stroke: MRI  showed Small acute infarcts in the bilateral frontal lobes, left occipital lobe, and left  cerebellum. 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  Recommended resuming simvastatin  and follow up with outpatient neurology [facility].   - Simvastatin  10 mg daily resumed on inpatient rehab admission.      10.HLD: continue Simvastatin  10 mg    11. Shortness of Breath: Chest x-ray demonstrated a stable small area of atelectasis within the left lung.  O2 sats 96-99% room air             - continue Incentive spirometer   12.  Tachycardia: resolved and patient is hypotensive, d/c toprol  XL   13.  Constipation.             LBM 11/10- pt states multiple and loose but this was not documented  D/c colace, cont prn miralax   14. Anemia: Hgb stable but still low contributing to poor endurance     Latest Ref Rng & Units 03/26/2024    5:04 AM 03/25/2024    4:51 AM 03/24/2024    5:09 AM  CBC  WBC 4.0 - 10.5 K/uL 11.2  10.0  10.9   Hemoglobin 12.0 - 15.0 g/dL 8.0  8.4  8.2   Hematocrit 36.0 - 46.0 % 24.1  24.6  23.8   Platelets 150 - 400 K/uL 379  293  250     LOS: 4 days A FACE TO FACE EVALUATION WAS PERFORMED  Prentice FORBES Compton 03/27/2024, 8:34 AM

## 2024-03-27 NOTE — Progress Notes (Signed)
 Physical Therapy Session Note  Patient Details  Name: Terri Alexander MRN: [id number] Date of Birth: 15-Sep-1950  Today's Date: 03/27/2024 PT Individual Time: 1109-1201 PT Individual Time Calculation (min): 52 min   Short Term Goals: Week 1:  PT Short Term Goal 1 (Week 1): Pt will demonstrate improved quality of gait with less restrictive AD than RW and overall CGA/ SBA. PT Short Term Goal 2 (Week 1): Pt will ambulate at least 150 ft using less restrictive AD than RW with overall CGA/ SBA. PT Short Term Goal 3 (Week 1): Pt will perform standing balance activities with no UE support and reduced posterior LOB. PT Short Term Goal 4 (Week 1): Pt will demosntrate ability to perform bed mobility with Mod I. PT Short Term Goal 5 (Week 1): Pt will perform all standing transfers with consistent supervision.  Skilled Therapeutic Interventions/Progress Updates:     PT arrived with pt seated in [facility] and agreeable to therapy with husband at bedside. Reports mild soreness of BLE d/t prior therapy.  Pt ambulated throughout session without AD and CGA with minimal verbal cueing to keep LLE strong to prevent knee buckling. PT ambulated to main gym. Performed standing reaches with CGA and without UE support from L>R outside of BOS to stack cones  from various heights x2. Pt able maintain balance using stepping strategy with minor missteps. Pt performed reaches from low surface, around 6in, to grab clip and reach crossbody to clip it onto basketball net that was elevated ~73ft while keeping feet stable. Pt had difficulty completing exercises without moving feet, noting that taking a step was just natural. PT then had pt return clips from net to low surface using same technique. No LOB noted.  Pt performed standing heel raises with an OH reach with minimal difficulty and CGA for safety 2x15. Completed 6in four stair navigation x6 with CGA. UE use decreased each set. PT instructed pt on leading with the stronger LE when  asc and the weaker LE when desc. One incident of LOB noted, pt was able catch self using UE on rails and gain balance using LE with CGA on gait belt from PT. Seated rest breaks were provided throughout session in between sets when necessary. Pt finished session ambulating ~237ft with CGA back to room. PT provided verbal cueing to keep LLE strong, and change walking pace (fast vs. slow vs. normal). Pt demonstrated increased difficulty with slow paced walking, noted missteps and crossover stepping. Pt was left seated in WC, brakes locks, with husband at bedside.  Therapy Documentation Precautions:  Precautions Precautions: Fall Recall of Precautions/Restrictions: Intact Precaution/Restrictions Comments: L weakness Restrictions Weight Bearing Restrictions Per Provider Order: No   Therapy/Group: Individual Therapy  Kylea Berrong SPT 03/27/2024, 12:15 PM

## 2024-03-27 NOTE — Progress Notes (Signed)
  Progress Note    03/27/2024 9:16 AM * No surgery found *  Subjective: feeling good. Out of bed ready for the day    Vitals:   03/26/24 1913 03/27/24 0522  BP: (!) 147/74 (!) 148/68  Pulse: 92 91  Resp: 16 18  Temp: 98.3 F (36.8 C) 98.9 F (37.2 C)  SpO2: 100% 95%   Physical Exam: Cardiac: normal rate Lungs:  non labored on RA Incisions:  groin incisions c/d/I; L arm incision with bruising but no hematoma Extremities:  palpable L ulnar; palpable DP pulses; moving all ext well Neurologic: A&O  CBC    Component Value Date/Time   WBC 11.2 (H) 03/26/2024 0504   RBC 2.71 (L) 03/26/2024 0504   HGB 8.0 (L) 03/26/2024 0504   HGB 13.6 11/09/2023 0842   HCT 24.1 (L) 03/26/2024 0504   HCT 42.7 11/09/2023 0842   PLT 379 03/26/2024 0504   PLT 335 11/09/2023 0842   MCV 88.9 03/26/2024 0504   MCV 94 11/09/2023 0842   MCH 29.5 03/26/2024 0504   MCHC 33.2 03/26/2024 0504   RDW 15.2 03/26/2024 0504   RDW 12.6 11/09/2023 0842   LYMPHSABS 1.4 03/25/2024 0451   MONOABS 1.2 (H) 03/25/2024 0451   EOSABS 0.0 03/25/2024 0451   BASOSABS 0.0 03/25/2024 0451    BMET    Component Value Date/Time   NA 138 03/26/2024 0504   NA 139 11/09/2023 0841   K 3.7 03/26/2024 0504   CL 108 03/26/2024 0504   CO2 20 (L) 03/26/2024 0504   GLUCOSE 111 (H) 03/26/2024 0504   BUN 10 03/26/2024 0504   BUN 21 11/09/2023 0841   CREATININE 0.54 03/26/2024 0504   CREATININE 1.13 (H) 10/03/2023 0956   CALCIUM 8.1 (L) 03/26/2024 0504   GFRNONAA >60 03/26/2024 0504   GFRNONAA 88 09/18/2019 1039   GFRAA 102 09/18/2019 1039    INR    Component Value Date/Time   INR 1.3 (H) 03/19/2024 1700     Intake/Output Summary (Last 24 hours) at 03/27/2024 0916 Last data filed at 03/27/2024 0727 Gross per 24 hour  Intake 456 ml  Output --  Net 456 ml     Assessment/Plan:  73 y.o. female is s/p TEVAR with subclavian branch endoprosthesis   Continues to improve. Feels like she is getting better every  day. Will continue to follow.  No plans for further intervention.  Appreciate all of the care and hard work from geophysicist/field seismologist.   [name] MD [facility] Phone Number: [phone] 03/27/2024 9:16 AM

## 2024-03-27 NOTE — Progress Notes (Signed)
 Occupational Therapy Session Note  Patient Details  Name: Terri Alexander MRN: [id number] Date of Birth: April 29, 1951  Today's Date: 03/27/2024 OT Individual Time: 0800-0830 OT Individual Time Calculation (min): 30 min    Short Term Goals: Week 1:  OT Short Term Goal 1 (Week 1): The pt will safety complete all functional t/f's to all surfaces with ModI. OT Short Term Goal 2 (Week 1): The pt will safely bathe UB/LB with ModI at incorporating AE as needed. OT Short Term Goal 3 (Week 1): The ppt will dress UB/LB with ModI incorporating AE as needed. OT Short Term Goal 4 (Week 1): The pt will safely tolerate > 30 minutes of OT acivity requiring  < 4 rest breaks. OT Short Term Goal 5 (Week 1): The pt will safely complete a simple home making task requiring < 3 rest breaks.  Skilled Therapeutic Interventions/Progress Updates:      Therapy Documentation Precautions:  Precautions Precautions: Fall Recall of Precautions/Restrictions: Intact Precaution/Restrictions Comments: L weakness Restrictions Weight Bearing Restrictions Per Provider Order: No General: Pt seated in W/C upon OT arrival, agreeable to OT. Husband present   Pain: no pain reported  Exercises: Pt completed the following exercise circuit in order to improve functional activity, strength and endurance to prepare for ADLs such as bathing. Pt completed the following exercises with noted LOB/SOB and various repetitions on each exercise: -modified squats with resistance band around knees for increased glute activation, pt completing at supervision  -resisted knee flexion -resisted clam shells   Pt seated in W/C at end of session with call light within reach and 4Ps assessed. Husband present.    Therapy/Group: Individual Therapy  Camie Hoe, OTD, OTR/L 03/27/2024, 9:56 AM

## 2024-03-27 NOTE — Plan of Care (Signed)
  Problem: Consults Goal: RH STROKE PATIENT EDUCATION Description: See Patient Education module for education specifics  Outcome: Progressing Goal: Nutrition Consult-if indicated Outcome: Progressing   Problem: RH BOWEL ELIMINATION Goal: RH STG MANAGE BOWEL WITH ASSISTANCE Description: STG Manage Bowel with mod I Assistance. Outcome: Progressing Goal: RH STG MANAGE BOWEL W/MEDICATION W/ASSISTANCE Description: STG Manage Bowel with Medication with mod I  Assistance. Outcome: Progressing   Problem: RH SAFETY Goal: RH STG ADHERE TO SAFETY PRECAUTIONS W/ASSISTANCE/DEVICE Description: STG Adhere to Safety Precautions With cues Assistance/Device. Outcome: Progressing   Problem: RH PAIN MANAGEMENT Goal: RH STG PAIN MANAGED AT OR BELOW PT'S PAIN GOAL Description: Pain < 4 with prns Outcome: Progressing   Problem: RH KNOWLEDGE DEFICIT Goal: RH STG INCREASE KNOWLEGDE OF HYPERLIPIDEMIA Description: Patient and spouse will be able to manage HLD using educational resources for medications and dietary modification independently Outcome: Progressing Goal: RH STG INCREASE KNOWLEDGE OF STROKE PROPHYLAXIS Description: Patient and spouse will be able to manage secondary risks using educational resources for medications and dietary modification independently Outcome: Progressing

## 2024-03-28 ENCOUNTER — Other Ambulatory Visit (HOSPITAL_COMMUNITY): Payer: Self-pay

## 2024-03-28 DIAGNOSIS — M7918 Myalgia, other site: Secondary | ICD-10-CM | POA: Diagnosis not present

## 2024-03-28 DIAGNOSIS — D62 Acute posthemorrhagic anemia: Secondary | ICD-10-CM | POA: Diagnosis not present

## 2024-03-28 DIAGNOSIS — I6310 Cerebral infarction due to embolism of unspecified precerebral artery: Secondary | ICD-10-CM | POA: Diagnosis not present

## 2024-03-28 MED ORDER — ACETAMINOPHEN 325 MG PO TABS: 325.0000 mg | ORAL_TABLET | ORAL | Status: AC | PRN

## 2024-03-28 MED ORDER — FUROSEMIDE 40 MG PO TABS
40.0000 mg | ORAL_TABLET | ORAL | 0 refills | Status: DC | PRN
  Filled 2024-03-28: qty 30, 30d supply, fill #0

## 2024-03-28 MED ORDER — FAMOTIDINE 10 MG PO TABS
10.0000 mg | ORAL_TABLET | Freq: Two times a day (BID) | ORAL | 0 refills | Status: AC
  Filled 2024-03-28: qty 60, 30d supply, fill #0

## 2024-03-28 MED ORDER — SENNOSIDES-DOCUSATE SODIUM 8.6-50 MG PO TABS
1.0000 | ORAL_TABLET | Freq: Two times a day (BID) | ORAL | 0 refills | Status: AC
  Filled 2024-03-28: qty 60, 30d supply, fill #0

## 2024-03-28 MED ORDER — OXYCODONE HCL 5 MG PO TABS
5.0000 mg | ORAL_TABLET | ORAL | 0 refills | Status: AC | PRN
  Filled 2024-03-28: qty 28, 5d supply, fill #0

## 2024-03-28 MED ORDER — POLYETHYLENE GLYCOL 3350 17 G PO PACK: 17.0000 g | PACK | Freq: Every day | ORAL | Status: AC

## 2024-03-28 MED ORDER — SIMVASTATIN 10 MG PO TABS
10.0000 mg | ORAL_TABLET | Freq: Every day | ORAL | 0 refills | Status: DC
  Filled 2024-03-28: qty 30, 30d supply, fill #0

## 2024-03-28 NOTE — Discharge Instructions (Addendum)
 Inpatient Rehab Discharge Instructions  Terri Alexander  Discharge date and time: 03/30/2024  Activities/Precautions/ Functional Status:  Activity: no lifting, driving, or strenuous exercise for until cleared by provider  Diet: regular diet  Wound Care: none needed   Functional status:  ___ No restrictions     ___ Walk up steps independently ___ 24/7 supervision/assistance   ___ Walk up steps with assistance __x_ Intermittent supervision/assistance  ___ Bathe/dress independently ___ Walk with walker     ___ Bathe/dress with assistance ___ Walk Independently    ___ Shower independently ___ Walk with assistance    ___ Shower with assistance __X_ No alcohol     ___ Return to work/school ________  Special Instructions:    My questions have been answered and I understand these instructions. I will adhere to these goals and the provided educational materials after my discharge from the hospital.  Patient/Caregiver Signature _______________________________ Date __________  Clinician Signature _______________________________________ Date __________  Please bring this form and your medication list with you to all your follow-up doctor's appointments.       COMMUNITY REFERRALS UPON DISCHARGE:    Outpatient: PT             Agency:[facility]  [address] [place] [address] Phone:[phone]              Appointment Date/Time:WILL CALL TO SET UP FOLLOW UP APPOINTMENT  Medical Equipment/Items Ordered:NONE RECOMMENDED                                                 Agency/Supplier:NA       STROKE/TIA DISCHARGE INSTRUCTIONS SMOKING Cigarette smoking nearly doubles your risk of having a stroke & is the single most alterable risk factor  If you smoke or have smoked in the last 12 months, you are advised to quit smoking for your health. Most of the excess cardiovascular risk related to smoking disappears within a year of stopping. Ask you doctor  about anti-smoking medications [place] Quit Line: 1-800-QUIT NOW Free Smoking Cessation Classes [phone]  CHOLESTEROL Know your levels; limit fat & cholesterol in your diet  Lipid Panel     Component Value Date/Time   CHOL 141 03/22/2024 1041   TRIG 110 03/22/2024 1041   HDL 48 03/22/2024 1041   CHOLHDL 2.9 03/22/2024 1041   VLDL 22 03/22/2024 1041   LDLCALC 71 03/22/2024 1041   LDLCALC 64 06/07/2022 0757     Many patients benefit from treatment even if their cholesterol is at goal. Goal: Total Cholesterol (CHOL) less than 160 Goal:  Triglycerides (TRIG) less than 150 Goal:  HDL greater than 40 Goal:  LDL (LDLCALC) less than 100   BLOOD PRESSURE American Stroke Association blood pressure target is less that 120/80 mm/Hg  Your discharge blood pressure is:  BP: (!) 134/50 Monitor your blood pressure Limit your salt and alcohol intake Many individuals will require more than one medication for high blood pressure  DIABETES (A1c is a blood sugar average for last 3 months) Goal HGBA1c is under 7% (HBGA1c is blood sugar average for last 3 months)  Diabetes: No known diagnosis of diabetes    Lab Results  Component Value Date   HGBA1C 5.1 03/22/2024    Your HGBA1c can be lowered with medications, healthy diet, and exercise. Check your blood sugar as directed  by your physician Call your physician if you experience unexplained or low blood sugars.  PHYSICAL ACTIVITY/REHABILITATION Goal is 30 minutes at least 4 days per week  Activity: Increase activity slowly, and No driving, Therapies: Physical Therapy: Outpatient and Occupational Therapy: Outpatient Return to work: n/a Activity decreases your risk of heart attack and stroke and makes your heart stronger.  It helps control your weight and blood pressure; helps you relax and can improve your mood. Participate in a regular exercise program. Talk with your doctor about the best form of exercise for you (dancing, walking, swimming,  cycling).  DIET/WEIGHT Goal is to maintain a healthy weight  Your discharge diet is:  Diet Order             Diet regular Room service appropriate? Yes; Fluid consistency: Thin  Diet effective now                  Your height is:  Height: 5' 2 (157.5 cm) Your current weight is: Weight: 66.9 kg Your Body Mass Index (BMI) is:  BMI (Calculated): 26.97 Following the type of diet specifically designed for you will help prevent another stroke. Your goal Body Mass Index (BMI) is 19-24. Healthy food habits can help reduce 3 risk factors for stroke:  High cholesterol, hypertension, and excess weight.  RESOURCES Stroke/Support Group:  Call [phone]   STROKE EDUCATION PROVIDED/REVIEWED AND GIVEN TO PATIENT Stroke warning signs and symptoms How to activate emergency medical system (call 911). Medications prescribed at discharge. Need for follow-up after discharge. Personal risk factors for stroke. Pneumonia vaccine given: No Flu vaccine given: No My questions have been answered, the writing is legible, and I understand these instructions.  I will adhere to these goals & educational materials that have been provided to me after my discharge from the hospital.

## 2024-03-28 NOTE — Progress Notes (Signed)
 Physical Therapy Note  Patient Details  Name: MAGIE CIAMPA MRN: [id number] Date of Birth: 15-Jun-1950 Today's Date: 03/28/2024    Physical Therapist participated in the interdisciplinary team conference, providing clinical information regarding the patient's current status, treatment goals, and weekly focus, including any barriers that need to be addressed. Please see the Inpatient Rehabilitation Team Conference and Plan of Care Update for further details.    Mliss DELENA Milliner PT, DPT, CSRS 03/28/2024, 12:55 PM

## 2024-03-28 NOTE — Progress Notes (Signed)
 Occupational Therapy Note  Patient Details  Name: Terri Alexander MRN: [id number] Date of Birth: 11/03/1950  Occupational Therapist participated in the interdisciplinary team conference, providing clinical information regarding the patient's current status, treatment goals, and weekly focus, including any barriers that need to be addressed. Please see the Inpatient Rehabilitation Team Conference and Plan of Care Update for further details.     Katheryn SQUIBB Woodson 03/28/2024, 10:43 AM

## 2024-03-28 NOTE — Progress Notes (Signed)
 Physical Therapy Session Note  Patient Details  Name: Terri Alexander MRN: [id number] Date of Birth: [birth year]  Today's Date: 03/28/2024 PT Individual Time: 0805-0901 PT Individual Time Calculation (min): 56 min   Short Term Goals: Week 1:  PT Short Term Goal 1 (Week 1): Pt will demonstrate improved quality of gait with less restrictive AD than RW and overall CGA/ SBA. PT Short Term Goal 2 (Week 1): Pt will ambulate at least 150 ft using less restrictive AD than RW with overall CGA/ SBA. PT Short Term Goal 3 (Week 1): Pt will perform standing balance activities with no UE support and reduced posterior LOB. PT Short Term Goal 4 (Week 1): Pt will demosntrate ability to perform bed mobility with Mod I. PT Short Term Goal 5 (Week 1): Pt will perform all standing transfers with consistent supervision.  Skilled Therapeutic Interventions/Progress Updates:  Patient in bathroom on entrance to room. Husband present. Completes toileting and clothing mgmt with Mod I/ IND. Patient alert and agreeable to PT session.   Patient with no pain complaint at start of session.  Therapeutic Activity: Transfers: Pt performed sit<>stand and stand pivot transfers throughout session with Mod I. No cueing required.  Pt guided in continuous reciprocation of BUE and BLE using NuStep L3 x , brief seated rest break the L3x5min with focus on  maintaining pace with use of pace partner program. She is able to maintain an average of 54 steps/ min throughout and completing 1,007 steps over distance of 0.6 mi. Averages 1.7 METs during bout. No LUE pain noted with activity. Pt relates similar exercise equipment in nearby gym for her neighborhood. Recommended use of gym even if she has to drive there initially. But if can walk to activity center, will get even more exercise required for maintaining CV health.   Gait Training:  6 Min Walk Test:  Instructed patient to ambulate as quickly and as safely as possible for 6  minutes using LRAD. Patient was allowed to take standing rest breaks without stopping the test, but if the patient required a sitting rest break the clock would be stopped and the test would be over.  Results: 965 feet (294.1 meters, Avg speed 0.82 m/s) using no AD with supervision. Results indicate that the patient has good endurance with ambulation compared to age matched norms and is a tourist information centre manager. Age Matched Norms: 74-69 yo M: 59 F: 25, 34-79 yo M: 53 F: 471, 13-89 yo M: 417 F: 392 MDC: 58.21 meters (190.98 feet) or 50 meters (ANPTA Core Set of Outcome Measures for Adults with Neurologic Conditions, 2018)   Only one misstep noted during walk test and pt able to self correct with no cueing or assist.  Neuromuscular Re-ed: NMR facilitated during session with focus on standing balance. Pt guided in functional reaching activity with dynamic stepping fwd/ bkwd/ laterally. No LOB noted and good coordination/ proactive balance noted. NMR performed for improvements in motor control and coordination, balance, sequencing, judgement, and self confidence/ efficacy in performing all aspects of mobility at highest level of independence.   Patient seated upright in recliner at end of session with brakes locked, no alarm set, and all needs within reach.   Therapy Documentation Precautions:  Precautions Precautions: Fall Recall of Precautions/Restrictions: Intact Precaution/Restrictions Comments: L weakness Restrictions Weight Bearing Restrictions Per Provider Order: No  Pain:  Minimal stretch noted in LUE with reach/ shoulder extension toward 74*. Pt worked within pain free range.   Therapy/Group: Individual Therapy  [facility]OR  Thaddeus PT, DPT, CSRS 03/28/2024, 8:30 AM

## 2024-03-28 NOTE — Discharge Summary (Signed)
 Physician Discharge Summary  Patient ID: [name] MRN: [id number] DOB/AGE: Aug 19, 1950 73 y.o.  Admit date: 03/23/2024 Discharge date: 03/28/2024  Discharge Diagnoses:  Principal Problem:   CVA (cerebral vascular accident) [facility]) Active Problems:   Dyslipidemia   Aortic dissection distal to left subclavian (HCC)   Atrial fibrillation with RVR (HCC)   Persistent atrial fibrillation (HCC)   Hypercoagulable state due to persistent atrial fibrillation (HCC)   S/P AAA (abdominal aortic aneurysm) repair   Thoracic aortic aneurysm, without rupture, unspecified   Discharged Condition: stable  Significant Diagnostic Studies: DG Chest Port 1 View Result Date: 03/23/2024 CLINICAL DATA:  Shortness of breath EXAM: PORTABLE CHEST 1 VIEW COMPARISON:  Chest radiograph dated 03/19/2024 FINDINGS: Normal lung volumes. Unchanged dense left retrocardiac opacity. Unchanged trace blunting of the left costophrenic angle. Similar cardiomediastinal silhouette. No acute osseous abnormality. Similar aortic, left common carotid, and left axillary vascular stent graft. IMPRESSION: 1. Unchanged dense left retrocardiac opacity, likely atelectasis. Aspiration or pneumonia can be considered in the appropriate clinical setting. 2. Unchanged trace left pleural effusion. Electronically Signed   By: Limin  Xu M.D.   On: 03/23/2024 09:10   ECHOCARDIOGRAM COMPLETE Result Date: 03/22/2024    ECHOCARDIOGRAM REPORT   Patient Name:   Terri Alexander Date of Exam: 03/22/2024 Medical Rec #:  [id number]      Height:       62.0 in Accession #:    [id number]     Weight:       140.0 lb Date of Birth:  [birth year]      BSA:          1.643 m Patient Age:    72 years       BP:           118/47 mmHg Patient Gender: F              HR:           90 bpm. Exam Location:  Inpatient Procedure: 2D Echo, Cardiac Doppler and Color Doppler (Both Spectral and Color            Flow Doppler were utilized during procedure). Indications:    Stroke i63.9   History:        Patient has prior history of Echocardiogram examinations, most                 recent 08/18/2022. Arrythmias:Atrial Fibrillation; Risk                 Factors:Dyslipidemia.  Sonographer:    [name] Argyle Referring Phys: [id number] DEVON SHAFER IMPRESSIONS  1. Left ventricular ejection fraction, by estimation, is 60 to 65%. The left ventricle has normal function. The left ventricle has no regional wall motion abnormalities. Left ventricular diastolic parameters were normal.  2. Right ventricular systolic function is normal. The right ventricular size is normal.  3. The mitral valve is normal in structure. Mild mitral valve regurgitation. No evidence of mitral stenosis.  4. The aortic valve is tricuspid. There is mild calcification of the aortic valve. Aortic valve regurgitation is not visualized. Aortic valve sclerosis/calcification is present, without any evidence of aortic stenosis.  5. Aortic dilatation noted. There is borderline dilatation of the ascending aorta, measuring 38 mm.  6. The inferior vena cava is normal in size with greater than 50% respiratory variability, suggesting right atrial pressure of 3 mmHg. FINDINGS  Left Ventricle: Left ventricular ejection fraction, by estimation, is 60 to 65%. The left ventricle has normal  function. The left ventricle has no regional wall motion abnormalities. The left ventricular internal cavity size was normal in size. There is  no left ventricular hypertrophy. Left ventricular diastolic parameters were normal. Right Ventricle: The right ventricular size is normal. No increase in right ventricular wall thickness. Right ventricular systolic function is normal. Left Atrium: Left atrial size was normal in size. Right Atrium: Right atrial size was normal in size. Pericardium: There is no evidence of pericardial effusion. Mitral Valve: The mitral valve is normal in structure. Mild mitral valve regurgitation. No evidence of mitral valve stenosis. Tricuspid Valve: The  tricuspid valve is normal in structure. Tricuspid valve regurgitation is trivial. No evidence of tricuspid stenosis. Aortic Valve: The aortic valve is tricuspid. There is mild calcification of the aortic valve. Aortic valve regurgitation is not visualized. Aortic valve sclerosis/calcification is present, without any evidence of aortic stenosis. Pulmonic Valve: The pulmonic valve was normal in structure. Pulmonic valve regurgitation is not visualized. No evidence of pulmonic stenosis. Aorta: Aortic dilatation noted. There is borderline dilatation of the ascending aorta, measuring 38 mm. Venous: The inferior vena cava is normal in size with greater than 50% respiratory variability, suggesting right atrial pressure of 3 mmHg. IAS/Shunts: No atrial level shunt detected by color flow Doppler.  LEFT VENTRICLE PLAX 2D LVIDd:         4.40 cm   Diastology LVIDs:         3.10 cm   LV e' medial:    8.27 cm/s LV PW:         0.80 cm   LV E/e' medial:  13.8 LV IVS:        0.90 cm   LV e' lateral:   12.70 cm/s LVOT diam:     1.90 cm   LV E/e' lateral: 9.0 LV SV:         79 LV SV Index:   48 LVOT Area:     2.84 cm  RIGHT VENTRICLE             IVC RV S prime:     12.20 cm/s  IVC diam: 1.30 cm TAPSE (M-mode): 1.9 cm LEFT ATRIUM           Index        RIGHT ATRIUM           Index LA diam:      3.20 cm 1.95 cm/m   RA Area:     12.00 cm LA Vol (A2C): 37.7 ml 22.95 ml/m  RA Volume:   23.30 ml  14.18 ml/m LA Vol (A4C): 32.0 ml 19.48 ml/m  AORTIC VALVE LVOT Vmax:   152.50 cm/s LVOT Vmean:  99.800 cm/s LVOT VTI:    0.277 m  AORTA Ao Root diam: 3.60 cm Ao Asc diam:  3.80 cm MITRAL VALVE MV Area (PHT): 4.06 cm     SHUNTS MV Decel Time: 187 msec     Systemic VTI:  0.28 m MV E velocity: 114.00 cm/s  Systemic Diam: 1.90 cm MV A velocity: 58.20 cm/s MV E/A ratio:  1.96 Toribio Fuel MD Electronically signed by Toribio Fuel MD Signature Date/Time: 03/22/2024/1:10:50 PM    Final    MR ANGIO HEAD WO CONTRAST Result Date:  03/22/2024 CLINICAL DATA:  Follow-up stroke EXAM: MRA HEAD WITHOUT CONTRAST TECHNIQUE: Angiographic images of the Circle of Willis were acquired using MRA technique without intravenous contrast. COMPARISON:  None Available. FINDINGS: MRA intracranial: Right-side: The internal carotid artery is patent without significant stenosis.  The middle cerebral artery is patent without significant stenosis or proximal branch occlusion. The anterior cerebral artery is patent without significant stenosis or proximal branch occlusion. No aneurysm. Left side: The internal carotid artery is patent without significant stenosis. The middle cerebral artery is patent without significant stenosis or proximal branch occlusion. The anterior cerebral artery is patent without significant stenosis or proximal branch occlusion. No aneurysm. Posterior circulation: Both vertebral arteries are patent. The basilar artery is patent without significant stenosis. Both posterior cerebral arteries are patent. No aneurysm. Other comments: None IMPRESSION: Normal Electronically Signed   By: Nancyann Burns M.D.   On: 03/22/2024 09:21   MR BRAIN WO CONTRAST Result Date: 03/22/2024 EXAM: MRI BRAIN WITHOUT CONTRAST 03/21/2024 08:43:30 PM TECHNIQUE: Multiplanar multisequence MRI of the head/brain was performed without the administration of intravenous contrast. COMPARISON: None available. CLINICAL HISTORY: New onset ataxia. FINDINGS: BRAIN AND VENTRICLES: Small acute infarcts in the right frontal white matter, left occipital lobe, and left cerebllum. Punctate acute infract in the high left frontal lobe (series 5 image 89). No mass. No midline shift. No hydrocephalus. Normal flow voids. ORBITS: No acute abnormality. SINUSES AND MASTOIDS: No acute abnormality. BONES AND SOFT TISSUES: Normal marrow signal. IMPRESSION: 1. Small acute infarcts in the bilateral frontal lobes, left occipital lobe, and left cerebellum. Given involvement of multiple vascular  territories, consider embolic etiology. Electronically signed by: Gilmore Molt MD 03/22/2024 12:44 AM EST RP Workstation: [id number]   VAS US  CAROTID Result Date: 03/21/2024 Carotid Arterial Duplex Study Patient Name:  TECKLA CHRISTIANSEN  Date of Exam:   03/21/2024 Medical Rec #: [id number]       Accession #:    [id number] Date of Birth: May 15, 1951       Patient Gender: F Patient Age:   63 years Exam Location:  [facility] Procedure:      VAS US  CAROTID Referring Phys: FONDA ROBINS --------------------------------------------------------------------------------  Indications:       Carotid artery disease and TEVAR with left subclavian stent.                    Want to ensure adequate carotid and vertebral perfusion. Comparison Study:  No priors. Performing Technologist: Ricka Sturdivant-Jones RDMS, RVT  Examination Guidelines: A complete evaluation includes B-mode imaging, spectral Doppler, color Doppler, and power Doppler as needed of all accessible portions of each vessel. Bilateral testing is considered an integral part of a complete examination. Limited examinations for reoccurring indications may be performed as noted.  Right Carotid Findings: +----------+--------+--------+--------+------------------+------------------+           PSV cm/sEDV cm/sStenosisPlaque DescriptionComments           +----------+--------+--------+--------+------------------+------------------+ CCA Prox  81      14                                                   +----------+--------+--------+--------+------------------+------------------+ CCA Distal63      20                                intimal thickening +----------+--------+--------+--------+------------------+------------------+ ICA Prox  38      13  intimal thickening +----------+--------+--------+--------+------------------+------------------+ ICA Mid   141     36                                tortuous            +----------+--------+--------+--------+------------------+------------------+ ICA Distal108     30                                tortuous           +----------+--------+--------+--------+------------------+------------------+ ECA       141     22                                                   +----------+--------+--------+--------+------------------+------------------+ +----------+--------+-------+----------------+-------------------+           PSV cm/sEDV cmsDescribe        Arm Pressure (mmHG) +----------+--------+-------+----------------+-------------------+ [id number]            Multiphasic, WNL                    +----------+--------+-------+----------------+-------------------+ +---------+--------+--+--------+--+---------+ VertebralPSV cm/s68EDV cm/s20Antegrade +---------+--------+--+--------+--+---------+ Elevated velocities in the mid ICA due to tortuosity of the vessel. Left Carotid Findings: +----------+--------+--------+--------+------------------+------------------+           PSV cm/sEDV cm/sStenosisPlaque DescriptionComments           +----------+--------+--------+--------+------------------+------------------+ CCA Prox  111     15                                                   +----------+--------+--------+--------+------------------+------------------+ CCA Distal88      25                                                   +----------+--------+--------+--------+------------------+------------------+ ICA Prox  67      19                                intimal thickening +----------+--------+--------+--------+------------------+------------------+ ICA Mid   83      22                                                   +----------+--------+--------+--------+------------------+------------------+ ICA Distal101     29                                tortuous            +----------+--------+--------+--------+------------------+------------------+ ECA       90      16                                                   +----------+--------+--------+--------+------------------+------------------+ +----------+--------+--------+--------+-------------------+  PSV cm/sEDV cm/sDescribeArm Pressure (mmHG) +----------+--------+--------+--------+-------------------+ Subclavian226                                         +----------+--------+--------+--------+-------------------+ +---------+--------+--+--------+--+---------+ VertebralPSV cm/s71EDV cm/s21Antegrade +---------+--------+--+--------+--+---------+ Patent left subclavian stent  Summary: Right Carotid: The extracranial vessels were near-normal with only minimal wall                thickening or plaque. Left Carotid: The extracranial vessels were near-normal with only minimal wall               thickening or plaque. Vertebrals:  Bilateral vertebral arteries demonstrate antegrade flow. Subclavians: Normal flow hemodynamics were seen in bilateral subclavian              arteries. *See table(s) above for measurements and observations.  Electronically signed by Gaile New MD on 03/21/2024 at 5:36:37 PM.    Final    DG Chest Port 1 View Result Date: 03/20/2024 EXAM: 1 VIEW(S) XRAY OF THE CHEST 03/19/2024 04:56:00 PM COMPARISON: Comparison to CTA of the chest of 03/02/2024. CLINICAL HISTORY: S/P insertion of endovascular thoracic aortic stent graft. FINDINGS: LINES, TUBES AND DEVICES: Interval placement of a transverse and proximal descending aortic stent graft. Residual aneurysmal dilatation of the aorta. Left axillary vascular stent. LUNGS AND PLEURA: Mild left base atelectasis. No pulmonary edema. No pleural effusion. No pneumothorax. HEART AND MEDIASTINUM: No acute abnormality of the cardiac and mediastinal silhouettes. BONES AND SOFT TISSUES: No acute osseous abnormality. IMPRESSION: 1. Interval  placement of a transverse and proximal descending aortic stent graft with residual aneurysmal dilatation of the aorta. Electronically signed by: Rockey Kilts MD 03/20/2024 09:14 AM EST RP Workstation: [id number]   HYBRID OR IMAGING ([facility] ONLY) Result Date: 03/19/2024 There is no interpretation for this exam.  This order is for images obtained during a surgical procedure.  Please See Surgeries Tab for more information regarding the procedure.   CT CORONARY MORPH W/CTA COR W/SCORE W/CA W/CM &/OR WO/CM Addendum Date: 03/09/2024 ADDENDUM REPORT: 03/09/2024 21:10 EXAM: OVER-READ INTERPRETATION  CT CHEST The following report is an over-read performed by radiologist Dr. Andrea Gasman of [facility] on 03/09/2024. This over-read does not include interpretation of cardiac or coronary anatomy or pathology. The coronary CTA interpretation by the cardiologist is attached. COMPARISON:  Full field of view chest CTA performed concurrently FINDINGS: Aneurysmal dilatation is partially included, please reference full field of view chest CT for complete assessment. The included lungs are clear. IMPRESSION: Aneurysmal dilatation of the ascending aorta, partially included, please reference full field of view chest CTA for complete assessment. Electronically Signed   By: Andrea Gasman M.D.   On: 03/09/2024 21:10   Result Date: 03/09/2024 CLINICAL DATA:  30F with a chronic Type B aortic dissection, atrial fibrillation s/p ablation, and exertional chest discomfort for EXAM: Cardiac/Coronary  CT TECHNIQUE: The patient was scanned on a GE Apex scanner. PROTOCOL: A non-contrast, gated CT scan was obtained with axial slices of 2.5 mm through the heart for calcium scoring. Calcium scoring was performed using the Agatston method. A 120 kV prospective, gated, contrast cardiac CT scan was obtained. Gantry rotation speed was 230 msec and collimation was 0.63 mm. Two sublingual nitroglycerin  tablets (0.8 mg) were given.  The 3D data set was reconstructed with motion correction for the best systolic or diastolic phase. Images were analyzed on a dedicated workstation using MPR, MIP, and  VRT modes. The patient received 95 cc of contrast. FINDINGS: Aorta: Ascending aorta mildly dilated. 4.0 cm. Aortic atheroscleroses. Descending aorta lumen irregularities. Reported history of Type B dissection. Cannot rule out intramural hematoma. Aortic Valve:  Trileaflet.  No calcifications. Coronary Arteries:  Normal coronary origin.  Right dominance. RCA is a large dominant artery that gives rise to PDA and PLVB. There is no plaque. Left main is a large artery that gives rise to LAD and LCX arteries. LAD is a large vessel that has no plaque.  D1 has no plaque. LCX is a non-dominant artery that gives rise to a tiny OM1 and a large OM2 branch. There is no plaque. Coronary Calcium Score: 0 Other findings: Normal pulmonary vein drainage into the left atrium. Normal let atrial appendage without a thrombus. Normal size of the pulmonary artery. Non-cardiac: See separate report from [facility]. IMPRESSION: 1. Coronary calcium score of 0. This was 0 percentile for age-, sex, and race-matched controls. 2. Normal coronary arteries.  CAD-RADS 0. 3. Normal coronary origin with right dominance. 4. Ascending aorta mildly dilated. 4.0 cm. Aortic atheroscleroses. Descending aorta lumen irregularities. Reported history of Type B dissection. Cannot rule out intramural hematoma. RECOMMENDATIONS: CAD-RADS 0: No evidence of CAD (0%). Consider non-atherosclerotic causes of chest pain. Annabella Scarce, MD Electronically Signed: By: Annabella Scarce M.D. On: 03/02/2024 15:55   CT ANGIO CHEST/ABD/PEL FOR DISSECTION W &/OR WO CONTRAST Result Date: 03/06/2024 CLINICAL DATA:  Enlarging chronic thoracic aortic dissection EXAM: CT ANGIOGRAPHY CHEST, ABDOMEN AND PELVIS TECHNIQUE: Non-contrast CT of the chest was initially obtained. Multidetector CT imaging through  the chest, abdomen and pelvis was performed using the standard protocol during bolus administration of intravenous contrast. Multiplanar reconstructed images and MIPs were obtained and reviewed to evaluate the vascular anatomy. RADIATION DOSE REDUCTION: This exam was performed according to the departmental dose-optimization program which includes automated exposure control, adjustment of the mA and/or kV according to patient size and/or use of iterative reconstruction technique. CONTRAST:  OMNIPAQUE  IOHEXOL  350 MG/ML SOLN COMPARISON:  CT chest, 02/16/2024 FINDINGS: CTA CHEST FINDINGS VASCULAR Aorta: Satisfactory opacification of the aorta. Unchanged partially thrombosed chronic aneurysmal dissection of the distal thoracic aortic arch measuring up to 5.7 x 5.5 cm in caliber (series 308, image 40, series 604, image 133). Unchanged enlargement of the tubular ascending thoracic aorta measuring up to 4.3 x 4.1 cm. Unchanged enlargement of the descending thoracic aorta measuring 3.3 x 3.3 cm in the midportion of the vessel. No evidence of aneurysm, dissection, or other acute aortic pathology. Cardiovascular: No evidence of pulmonary embolism on limited non-tailored examination. Normal heart size. No pericardial effusion. Review of the MIP images confirms the above findings. NON VASCULAR Mediastinum/Nodes: No enlarged mediastinal, hilar, or axillary lymph nodes. Small hiatal hernia. Thyroid  gland, trachea, and esophagus demonstrate no significant findings. Lungs/Pleura: Mild bibasilar scarring or atelectasis. No pleural effusion or pneumothorax. Musculoskeletal: No chest wall abnormality. No acute osseous findings. Review of the MIP images confirms the above findings. CTA ABDOMEN AND PELVIS FINDINGS VASCULAR Normal caliber of the abdominal aorta. No evidence of aneurysm, dissection, or other acute aortic pathology. 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 Marked tortuosity of the iliac systems, particularly of the right common iliac artery. Review of the MIP images confirms the above findings. NON-VASCULAR Hepatobiliary: No solid liver abnormality is seen. No gallstones,  gallbladder wall thickening, or biliary dilatation. Pancreas: Unremarkable. No pancreatic ductal dilatation or surrounding inflammatory changes. Spleen: Normal in size without significant abnormality. Adrenals/Urinary Tract: Adrenal glands are unremarkable. Pelvic left kidney. No hydronephrosis. Right kidney is normal, without renal calculi, solid lesion, or hydronephrosis. Bladder is unremarkable. Stomach/Bowel: Stomach is within normal limits. Appendix not clearly visualized. No evidence of bowel wall thickening, distention, or inflammatory changes. Sigmoid diverticulosis. Lymphatic: No enlarged abdominal or pelvic lymph nodes. Reproductive: No mass or other significant abnormality. Other: No abdominal wall hernia or abnormality. No ascites. Musculoskeletal: No acute osseous findings. IMPRESSION: 1. Unchanged partially thrombosed chronic aneurysmal dissection of the distal thoracic aortic arch measuring up to 5.7 x 5.5 cm in caliber. 2. Unchanged enlargement of the tubular ascending thoracic aorta measuring up to 4.3 x 4.1 cm. 3. Unchanged enlargement of the descending thoracic aorta measuring 3.3 x 3.3 cm in the midportion of the vessel. 4. No evidence of abdominal aortic aneurysm, dissection, or other acute aortic pathology. 5. Pelvic left kidney. 6. Sigmoid diverticulosis without evidence of acute diverticulitis. Electronically Signed   By: Marolyn JONETTA Jaksch M.D.   On: 03/06/2024 11:26    Labs:  Basic Metabolic Panel: Recent Labs  Lab 03/22/24 1041 03/23/24 0834 03/24/24 0509 03/25/24 0451 03/26/24 0504  NA 136 136 134*  --  138  K 3.8 3.7 4.0  --  3.7  CL 106 104 102  --  108  CO2  21* 20* 22  --  20*  GLUCOSE 168* 122* 113*  --  111*  BUN 7* 11 9  --  10  CREATININE 0.67 0.64 0.68  --  0.54  CALCIUM 7.9* 8.1* 7.9*  --  8.1*  MG  --   --   --  2.3  --     CBC: Recent Labs  Lab 03/24/24 0509 03/25/24 0451 03/26/24 0504  WBC 10.9* 10.0 11.2*  NEUTROABS 7.6 7.2  --   HGB 8.2* 8.4* 8.0*  HCT 23.8* 24.6* 24.1*  MCV 88.1 87.9 88.9  PLT 250 293 379    CBG: Recent Labs  Lab 03/21/24 1708  GLUCAP 165*    Brief HPI:   Terri Alexander is a 73 y.o. female with PMHx atrial fibrillation (on Pradaxa ), HLD, PVD, and cataracts with known hx of type B aortic dissection with aneurysmal degeneration of the descending aorta who presented for scheduled TEVAR on 03/19/2024 by Dr. Lanis. The case was complicated by dissection of the left subclavian artery with need for stenting. Of the axillary and brachial arteries to reestablish flow to left upper extremity. The patient was recovering well and walking with PT until on 11/4 where she had an episode of transient aphasia that lasted a couple of minutes followed by another episode on 11/5 with unsteadiness while walking. Neurology was consulted and a MRI of the brain was ordered after recurrent episodes and small acute infarcts in the bilateral frontal lobes, left occipital lobe, and left cerebellum were noted. EF 60-65%, hemoglobin A1c 5.1, LDL 71. Neurology recommended resuming simvastatin  as patient has tolerated well in the past. Continue Pradaxa  and aspirin  per vascular surgery. Prior to arrival the patient was independent in the community. Patient currently requires set-up min A for basic ADLs at RW level. Therapy evaluations completed due to patient decreased functional mobility was admitted for a comprehensive rehab program.     Inpatient Rehabilitation Course: Terri Alexander was admitted to rehab 03/23/2024 for inpatient therapies to consist of PT, ST and OT at  least three hours five days a week. Past admission physiatrist,  therapy team and rehab RN have worked together to provide customized collaborative inpatient rehab.  Anticoagulation: Pradaxa  150 mg twice daily  Pain Management: Continue home Flexeril  10 mg.  Follow-up with PCP for further pain management oxycodone  as needed.    GERD: Continue PPI and as needed Pepcid .  Status post TEVAR with subclavian branch endoprosthesis: continue Pradaxa  and Asprin   Stroke: Continue simvastatin  10 mg daily and follow-up outpatient with neurology.  Tachycardia: Heart rate stable, patient experienced episode of hypotension where Toprol -XL was discontinued. Orthostatic hypotension was monitored, and blood pressures remained controlled. Patient advised to get blood pressure cuff for monitoring at home.   Hyperlipidemia: Continue simvastatin  10 mg  Constipation: Bowel regimen initiated last bowel movement 11/10 patient reported multiple loose stools, Colace was discontinued.  Continue MiraLAX  as needed.  Anemia: Hemoglobin low but stable and felt to be contributing to poor endurance.  Leukocytosis: Mild persistent leukocytosis noted, patient did not exhibit any fevers.  Follow-up PCP outpatient   Planned Outpatient Follow-Up:  -[facility] -PCP   Rehab course: During patient's stay in rehab weekly team conferences were held to monitor patient's progress, set goals and discuss barriers to discharge. At admission, patient required set-up min A for basic ADLs at RW level.   Occupational Therapy: Patient has met all long term goals due to improved activity tolerance, improved balance, postural control.  Patient to discharge at overall modified independent level, with good understanding and adherence to precautions. Patient's care partner independent to provide the necessary physical assistance at discharge.  Outpatient OT not recommended, patient back at baseline.    Physical Therapy: Patient has met all long term goals due to improved activity tolerance,  improved balance, improved postural control, increased strength, decreased pain, ability to compensate for deficits, improved attention, improved awareness, and improved coordination.  Patient to discharge at an ambulatory level. She will benefit from ongoing skilled PT services in neuro outpatient setting to continue to advance safe functional mobility, address ongoing impairments in strength, ROM, balance, endurance, gait, and minimize fall risk.   Discharge plan was discussed with patient and/or family member and they verbalized understanding and agreed with it.      Disposition:  Discharge disposition: 01-Home or Self Care        Diet: Heart Healthy   Special Instructions:  -No driving or operating heavy machinery until cleared by provider  -No smoking or alcohol or illicit drug use    Discharge Instructions     Ambulatory referral to Neurology   Complete by: As directed    An appointment is requested in approximately: 4 weeks   Ambulatory referral to Occupational Therapy   Complete by: As directed    Eval and treat   Ambulatory referral to Physical Medicine Rehab   Complete by: As directed    Ambulatory referral to Physical Therapy   Complete by: As directed       Allergies as of 03/28/2024   No Known Allergies      Medication List     PAUSE taking these medications    metoprolol  succinate 25 MG 24 hr tablet Wait to take this until your doctor or other care provider tells you to start again. Commonly known as: Toprol  XL Take 0.5 tablets (12.5 mg total) by mouth daily.       TAKE these medications    acetaminophen  325 MG tablet Commonly known as: TYLENOL  Take 1-2 tablets (325-650 mg  total) by mouth every 4 (four) hours as needed for mild pain (pain score 1-3). What changed:  medication strength how much to take when to take this reasons to take this   alendronate  70 MG tablet Commonly known as: FOSAMAX  TAKE 1 TABLET BY MOUTH ONCE A WEEK ON AN  EMPTY STOMACH WITH A FULL GLASS OF WATER   aspirin  EC 81 MG tablet Take 1 tablet (81 mg total) by mouth daily at 6 (six) AM. Swallow whole.   Biotin 1000 MCG tablet Take 1,000 mcg by mouth daily.   CALCIUM 1200 PO Take 1 tablet by mouth in the morning.   cetirizine 10 MG tablet Commonly known as: ZYRTEC Take 10 mg by mouth daily.   Cranberry 500 MG Chew Chew 500 mg by mouth in the morning.   cyclobenzaprine  10 MG tablet Commonly known as: FLEXERIL  Take 1 tablet (10 mg total) by mouth 3 (three) times daily as needed for muscle spasms.   dabigatran  150 MG Caps capsule Commonly known as: PRADAXA  Take 1 capsule (150 mg total) by mouth 2 (two) times daily.   docusate sodium  100 MG capsule Commonly known as: COLACE Take 100 mg by mouth in the morning.   famotidine  10 MG tablet Commonly known as: PEPCID  Take 1 tablet (10 mg total) by mouth 2 (two) times daily. What changed:  medication strength how much to take when to take this reasons to take this   FIBER-CAPS PO Take 1 capsule by mouth in the morning.   furosemide  40 MG tablet Commonly known as: LASIX  Take 1 tablet (40 mg total) by mouth as needed for edema.   L-Lysine 500 MG Caps Take 500 mg by mouth in the morning.   multivitamin with minerals Tabs tablet Take 1 tablet by mouth in the morning.   omeprazole  20 MG tablet Commonly known as: PRILOSEC  OTC Take 20 mg by mouth daily as needed (acid reflux).   oxyCODONE  5 MG immediate release tablet Commonly known as: Oxy IR/ROXICODONE  Take 1 tablet (5 mg total) by mouth every 4 (four) hours as needed for moderate pain (pain score 4-6).   polyethylene glycol 17 g packet Commonly known as: MIRALAX  / GLYCOLAX  Take 17 g by mouth daily. Start taking on: March 29, 2024   PROBIOTIC PO Take 1 tablet by mouth in the morning.   SALONPAS PAIN RELIEF PATCH EX Apply 1 patch topically daily as needed (pain).   SALONPAS PAIN RELIEVING EX Apply 1 Application  topically daily as needed (pain).   senna-docusate 8.6-50 MG tablet Commonly known as: Senokot-S Take 1 tablet by mouth 2 (two) times daily.   simvastatin  10 MG tablet Commonly known as: ZOCOR  Take 1 tablet (10 mg total) by mouth at bedtime.   vitamin C 1000 MG tablet Take 1,000 mg by mouth in the morning.   Vitamin D3 125 MCG (5000 UT) Tabs Take 5,000 Units by mouth in the morning.        Follow-up Information     [name], MD Follow up.   Specialty: Family Medicine Why: Call for a hospital follow-up appointment. Contact information: [address] [place] [address] [phone]         Emeline Search C, DO Follow up.   Specialty: Physical Medicine and Rehabilitation Why: Office will call for appointment Contact information: [address] [place] [address] [phone]         [facility] Follow up.   Specialty:  Neurology Why: Call for an appointment. Contact information: [address] [place]  [address] [phone]                Signed: Daphne LOISE Satterfield 03/28/2024, 3:43 PM

## 2024-03-28 NOTE — Progress Notes (Signed)
 Physical Therapy Session Note  Patient Details  Name: Terri Alexander MRN: [id number] Date of Birth: 1950/08/22  Today's Date: 03/28/2024 PT Individual Time: 1445-1526 PT Individual Time Calculation (min): 41 min   Short Term Goals: Week 1:  PT Short Term Goal 1 (Week 1): Pt will demonstrate improved quality of gait with less restrictive AD than RW and overall CGA/ SBA. PT Short Term Goal 2 (Week 1): Pt will ambulate at least 150 ft using less restrictive AD than RW with overall CGA/ SBA. PT Short Term Goal 3 (Week 1): Pt will perform standing balance activities with no UE support and reduced posterior LOB. PT Short Term Goal 4 (Week 1): Pt will demosntrate ability to perform bed mobility with Mod I. PT Short Term Goal 5 (Week 1): Pt will perform all standing transfers with consistent supervision.  Skilled Therapeutic Interventions/Progress Updates:     Pt seated in recliner upon arrival, pt reports 4/10 soreness in her shoulder blade area. Agreeable to therapy. Nursing administered medication for pain during session. Session emphasized dynamic balance and NMR. Pt tossed soccer ball toward bowling pins - bending down to retreive ball and pins - CGA/min A overall - 2 occurrences of LOB while throwing ball requiring mod/max A to recover. Pt performed dribbling and kicking soccer ball into goal - multiple times pt retrieved ball using R LE working on L SLS balance - CGA overall - 1 occurrence of LOB requiring mod A to recover. Pt performed weighted ball (1.1# and 2.2#) toss to rebounder on floor and foam surfaces with CGA overall - no LOB noted though less steady on foam. Pt able to throw and catch 1.1# ball with single UE (L and R) at times while maintaining balance. Functional ambulation to/from main gym without AD with CGA/SBA. Pt returned to recliner, seat alarm on, and all needs within reach at end of session.  Therapy Documentation Precautions:  Precautions Precautions: Fall Recall of  Precautions/Restrictions: Intact Precaution/Restrictions Comments: L weakness Restrictions Weight Bearing Restrictions Per Provider Order: No  Therapy/Group: Individual Therapy  Comer CHRISTELLA Levora Comer Levora, PT, DPT 03/28/2024, 1:06 PM

## 2024-03-28 NOTE — Progress Notes (Signed)
 Patient doing well this morning.  No concerns, no complaints. Working with physical therapy, stating she feels stronger every day.  I am happy with how she is doing.  I will continue to follow her daily.  Appreciate the excellent care from my rehab colleagues.  Terri Alexander Rim MD

## 2024-03-28 NOTE — Progress Notes (Signed)
 Occupational Therapy Session Note  Patient Details  Name: Terri Alexander MRN: [id number] Date of Birth: June 20, 1950  Today's Date: 03/28/2024 OT Individual Time: 0916-1000 OT Individual Time Calculation (min): 44 min    Short Term Goals: Week 1:  OT Short Term Goal 1 (Week 1): The pt will safety complete all functional t/f's to all surfaces with ModI. OT Short Term Goal 2 (Week 1): The pt will safely bathe UB/LB with ModI at incorporating AE as needed. OT Short Term Goal 3 (Week 1): The ppt will dress UB/LB with ModI incorporating AE as needed. OT Short Term Goal 4 (Week 1): The pt will safely tolerate > 30 minutes of OT acivity requiring  < 4 rest breaks. OT Short Term Goal 5 (Week 1): The pt will safely complete a simple home making task requiring < 3 rest breaks.  Skilled Therapeutic Interventions/Progress Updates:    Pt received resting in [facility] with husband present presenting to be in good spirits receptive to skilled OT session reporting 0/10 pain- OT offering intermittent rest breaks, repositioning, and therapeutic support to optimize participation in therapy session. Upon OT arrival, Pt requested to complete morning routine. Completed functional mobility to bathroom with close SUP with no AD. Pt doffed clothes with close SUP for safety. Completed shower with SUP for safety. Completed functional mobility back to bed with CGA for safety. Pt completed U/LB dressing while sitting EOB with SUP. Completed hair brushing and drying while standing at the sink with close SUP for safety to challenge standing balance and activity tolerance during task. Pt with noticeable fatigue after shower and hair drying, provided rest breaks as needed. Pt reported arms getting tired from completing hair care. Pt donned lotion while seated EOB with setup A. Noticeable improved balance and coordination during functional mobility, transfers, and ADLs. Pt was left resting in bed with call bell in reach, husband present and  all needs met.    Therapy Documentation Precautions:  Precautions Precautions: Fall Recall of Precautions/Restrictions: Intact Precaution/Restrictions Comments: L weakness Restrictions Weight Bearing Restrictions Per Provider Order: No  Therapy/Group: Individual Therapy  [name] 03/28/2024, 10:15 AM

## 2024-03-28 NOTE — Progress Notes (Signed)
 Physical Therapy Session Note  Patient Details  Name: Terri Alexander MRN: [id number] Date of Birth: 12-Nov-1950  Today's Date: 03/28/2024 PT Individual Time: 1415-1445 PT Individual Time Calculation (min): 30 min   Short Term Goals: Week 1:  PT Short Term Goal 1 (Week 1): Pt will demonstrate improved quality of gait with less restrictive AD than RW and overall CGA/ SBA. PT Short Term Goal 2 (Week 1): Pt will ambulate at least 150 ft using less restrictive AD than RW with overall CGA/ SBA. PT Short Term Goal 3 (Week 1): Pt will perform standing balance activities with no UE support and reduced posterior LOB. PT Short Term Goal 4 (Week 1): Pt will demosntrate ability to perform bed mobility with Mod I. PT Short Term Goal 5 (Week 1): Pt will perform all standing transfers with consistent supervision.  Skilled Therapeutic Interventions/Progress Updates: Pt sitting in recliner. Pt ambulated short distance in room with RW to void bladder with light CGA. Pt performed self pericare independently. Pt ambulated from room<>day room gym without AD and with CGA for safety. Pt participated in dynamic standing balance and coordination by ambulating twice around nsg/day room loop while tossing up beach ball and cued to look in various directions called out by PTA. Pt required CGA throughout and demonstrated safe pivot turns when cued to (180*). Pt cued to toss ball up to wall and to catch, or to toss to other therapist or kicking back to therapist with no LOB noted. Pt demonstrated stepping strategy to avoid LOB to the R. Pt required seated rest break. Pt ambulated around nsg/day room loop x 2 with tennis racket in R hand while having to balance soft red ball. Pt with light CGA for safety and demonstrated stepping strategies appropriately to maintain standing balance.      Therapy Documentation Precautions:  Precautions Precautions: Fall Recall of Precautions/Restrictions: Intact Precaution/Restrictions  Comments: L weakness Restrictions Weight Bearing Restrictions Per Provider Order: No  Therapy/Group: Individual Therapy  Aleph Nickson PTA 03/28/2024, 3:17 PM

## 2024-03-28 NOTE — Progress Notes (Addendum)
 PROGRESS NOTE   Subjective/Complaints:  Also c/o multiple loose BM on 11/10 only one on 11/11 Appreciate VVS note   ROS: +shoulder soreness, denies pain  Objective:   No results found.  Recent Labs    03/26/24 0504  WBC 11.2*  HGB 8.0*  HCT 24.1*  PLT 379   Recent Labs    03/26/24 0504  NA 138  K 3.7  CL 108  CO2 20*  GLUCOSE 111*  BUN 10  CREATININE 0.54  CALCIUM 8.1*    Intake/Output Summary (Last 24 hours) at 03/28/2024 0856 Last data filed at 03/28/2024 0750 Gross per 24 hour  Intake 120 ml  Output --  Net 120 ml        Physical Exam: Vital Signs Blood pressure (!) 134/50, pulse 77, temperature 98.6 F (37 C), temperature source Oral, resp. rate 16, height 5' 2 (1.575 m), weight 66.9 kg, SpO2 95%.  General: No acute distress Mood and affect are appropriate Heart: Regular rate and rhythm no rubs murmurs or extra sounds Lungs: Clear to auscultation, breathing unlabored, no rales or wheezes Abdomen: Positive bowel sounds, soft nontender to palpation, nondistended Extremities: No clubbing, cyanosis, or edema Skin: No evidence of breakdown, no evidence of rash, LUE forearm incision with dermabond , + ecchymosis MSK- no shoulder pain , mild upper trap tenderness no pain with neck ROM Spasticity: MAS 0 in all extremities.       Strength:                RUE: 5-/5 SA, 5-/5 EF, 5-/5 EE, 5-/5 WE, 5/5 FF, 5/5 FA                LUE:  5-/5 SA, 5-/5 EF, 5-/5 EE, 5-/5 WE, 5/5 FF, 5/5 FA                RLE: 5/5 HF, 5/5 KE, 5/5  DF, 5/5  EHL, 5/5  PF                 LLE:  4+/5 HF, 4+/5 KE, 5-/5  DF, 5-/5  EHL, 5-/5  PF,   Assessment/Plan: 1. Functional deficits which require 3+ hours per day of interdisciplinary therapy in a comprehensive inpatient rehab setting. Physiatrist is providing close team supervision and 24 hour management of active medical problems listed below. Physiatrist and rehab team  continue to assess barriers to discharge/monitor patient progress toward functional and medical goals  Care Tool:  Bathing    Body parts bathed by patient: Right arm, Left arm, Chest, Abdomen, Front perineal area, Buttocks, Right upper leg, Left upper leg, Right lower leg, Left lower leg, Face (requiring frequent restbreaks and CGA for standing)         Bathing assist Assist Level: Minimal Assistance - Patient > 75%     Upper Body Dressing/Undressing Upper body dressing   What is the patient wearing?: Pull over shirt    Upper body assist Assist Level: Set up assist    Lower Body Dressing/Undressing Lower body dressing      What is the patient wearing?: Underwear/pull up, Pants     Lower body assist Assist for lower body dressing:  Set up assist     Toileting Toileting    Toileting assist Assist for toileting: Independent with assistive device     Transfers Chair/bed transfer  Transfers assist     Chair/bed transfer assist level: Minimal Assistance - Patient > 75%     Locomotion Ambulation   Ambulation assist      Assist level: Minimal Assistance - Patient > 75% Assistive device: Walker-rolling Max distance: 90 ft   Walk 10 feet activity   Assist     Assist level: Minimal Assistance - Patient > 75% Assistive device: Walker-rolling   Walk 50 feet activity   Assist    Assist level: Minimal Assistance - Patient > 75% Assistive device: Walker-rolling    Walk 150 feet activity   Assist Walk 150 feet activity did not occur: Safety/medical concerns         Walk 10 feet on uneven surface  activity   Assist Walk 10 feet on uneven surfaces activity did not occur: Safety/medical concerns         Wheelchair     Assist Is the patient using a wheelchair?: Yes (will use for convenience while in rehab, not expecte to use upon d/c home) Type of Wheelchair: Manual    Wheelchair assist level: Total Assistance - Patient < 25% Max  wheelchair distance: 200 ft    Wheelchair 50 feet with 2 turns activity    Assist        Assist Level: Total Assistance - Patient < 25%   Wheelchair 150 feet activity     Assist      Assist Level: Total Assistance - Patient < 25%   Blood pressure (!) 134/50, pulse 77, temperature 98.6 F (37 C), temperature source Oral, resp. rate 16, height 5' 2 (1.575 m), weight 66.9 kg, SpO2 95%.  Medical Problem List and Plan: 1. 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 Formulized individual treatment plan based on medical history, underlying problem and comorbidities.   do not anticipate need for PMR f/u 2.  Antithrombotics: -DVT/anticoagulation:  Mechanical: Sequential compression devices, below knee Bilateral lower extremities Pharmaceutical: Other (comment) Pradaxa  150 mg BID              -antiplatelet therapy: Aspirin 81 mg    3. Pain Management: On home Flexeril  10 mg. Oxycodone and tylenol  prn    4. Mood/Behavior/Sleep: LCSW to follow for evaluation and support when available.              -antipsychotic agents: n/a   5. Neuropsych/cognition: This patient may be intermittently capable of making decisions on her own behalf.   6. Skin/Wound Care: Routine pressure relief measures.    7. Fluids/Electrolytes/Nutrition: Monitor I&O and weight. Follow up labs CBC/CMP               GERD: Protonix 40 mg daily, using prn Pepcid     8. Status post TEVAR with subclavian branch  endoprosthesis: continue Pradaxa  and Asprin    9. Stroke: MRI  showed Small acute  infarcts in the bilateral frontal lobes, left occipital lobe, and left cerebellum. 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  Recommended resuming simvastatin  and follow up with outpatient neurology [facility].   - Simvastatin  10 mg daily resumed on inpatient rehab admission.      10.HLD: continue Simvastatin  10 mg    11. Shortness of Breath: Chest x-ray demonstrated a stable small area of atelectasis within the left lung.  O2 sats 96-99% room air             - continue Incentive spirometer   12.  Tachycardia: resolved and patient is hypotensive, d/c toprol  XL   13.  Constipation.             LBM 11/10- pt states multiple and loose but this was not documented  D/c colace, cont prn miralax   14. Anemia: Hgb stable but still low contributing to poor endurance     Latest Ref Rng & Units 03/26/2024    5:04 AM 03/25/2024    4:51 AM 03/24/2024    5:09 AM  CBC  WBC 4.0 - 10.5 K/uL 11.2  10.0  10.9   Hemoglobin 12.0 - 15.0 g/dL 8.0  8.4  8.2   Hematocrit 36.0 - 46.0 % 24.1  24.6  23.8   Platelets 150 - 400 K/uL 379  293  250     LOS: 5 days A FACE TO FACE EVALUATION WAS PERFORMED  Prentice FORBES Compton 03/28/2024, 8:56 AM

## 2024-03-28 NOTE — Progress Notes (Signed)
 Patient ID: Terri Alexander, female   DOB: 1950/06/30, 73 y.o.   MRN: [id number]  Met with pt and husband to give team conference update regarding goals of mod/I-supervision level with target discharge date of 11/14. She is ready to go home. She is willing to use a cane but not a walker. Discussed OPPT and closer one is OP on [address]. Will get PA to make referral and they will reach out to pt to set up follow up appointments.

## 2024-03-29 ENCOUNTER — Other Ambulatory Visit (HOSPITAL_COMMUNITY): Payer: Self-pay

## 2024-03-29 DIAGNOSIS — I6310 Cerebral infarction due to embolism of unspecified precerebral artery: Secondary | ICD-10-CM | POA: Diagnosis not present

## 2024-03-29 LAB — CBC
HCT: 26.4 % — ABNORMAL LOW (ref 36.0–46.0)
Hemoglobin: 8.7 g/dL — ABNORMAL LOW (ref 12.0–15.0)
MCH: 29.9 pg (ref 26.0–34.0)
MCHC: 33 g/dL (ref 30.0–36.0)
MCV: 90.7 fL (ref 80.0–100.0)
Platelets: 569 K/uL — ABNORMAL HIGH (ref 150–400)
RBC: 2.91 MIL/uL — ABNORMAL LOW (ref 3.87–5.11)
RDW: 15.7 % — ABNORMAL HIGH (ref 11.5–15.5)
WBC: 11.2 K/uL — ABNORMAL HIGH (ref 4.0–10.5)
nRBC: 0 % (ref 0.0–0.2)

## 2024-03-29 NOTE — Progress Notes (Signed)
 Occupational Therapy Discharge Summary  Patient Details  Name: Terri Alexander MRN: [id number] Date of Birth: Jul 20, 1950  Date of Discharge from OT service:March 29, 2024  Today's Date: 03/29/2024 OT Individual Time: 9052-8969 OT Individual Time Calculation (min): 43 min    Patient has met 9 of 9 long term goals due to improved activity tolerance, improved balance, postural control, ability to compensate for deficits, improved attention, improved awareness, and improved coordination.  Patient to discharge at overall Modified Independent level.  Patient's care partner is independent to provide the necessary physical assistance at discharge.    Reasons goals not met: All goals met  Recommendation:  No further OT services recommended at this time as Pt is at her baseline for ADLs.   Equipment: No equipment provided. Pt owns shower seat.   Reasons for discharge: treatment goals met and discharge from hospital  Patient/family agrees with progress made and goals achieved: Yes  OT Discharge Skilled Therapeutic Interventions/Progress Updates:  Exercises Pt received sitting up in wc presenting to be in good spirits receptive to skilled OT session reporting 0/10 pain- OT offering intermittent rest breaks, repositioning, and therapeutic support to optimize participation in therapy session. Spent time at beginning of session completing grad day activities. Focused remainder of session on HEP education and endurance training.   Pt completed functional mobility to/from therapy gym with distant SUP provided for safety.   She completed B U/LE endurance training on NuStep to increase overall activity tolerance for ADLs and IADLs. Pt able to tolerate completing 2 bouts of 5 min on level 3 resistance with short rest breaks provided between and following trials.   Issued Pt a yellow (light resistance) therband and print out of B UE HEP. Provided demonstration and education on technique and engaged  Pt in completing the following exercises with OT providing verbal cues for technique:  - Seated Shoulder Horizontal Abduction with Resistance  - 1 x daily - 7 x weekly - 3 sets - 10 reps - Seated Bilateral Shoulder External Rotation with Resistance  - 1 x daily - 7 x weekly - 3 sets - 10 reps - Seated Elbow Extension with Self-Anchored Resistance  - 1 x daily - 7 x weekly - 3 sets - 10 reps - Seated Elbow Flexion with Self-Anchored Resistance  - 1 x daily - 7 x weekly - 3 sets - 10 reps - Seated Shoulder Row with Resistance Anchored at Feet  - 1 x daily - 7 x weekly - 3 sets - 10 reps - Seated Scapular Protraction with Resistance  - 1 x daily - 7 x weekly - 3 sets - 10 reps  Pt was left resting in wc with call bell in reach, husband present in room, and all needs met.    Precautions/Restrictions  Precautions Precautions: Fall Recall of Precautions/Restrictions: Intact Precaution/Restrictions Comments: mild L weakness, rare demo of LE dyscoordination Restrictions Weight Bearing Restrictions Per Provider Order: No Pain Pain Assessment Pain Scale: 0-10 Pain Score: 0-No pain ADL ADL Equipment Provided: Other (comment) (RW, w/c) Eating: Independent Where Assessed-Eating: Chair Grooming: Modified independent Where Assessed-Grooming: Standing at sink Upper Body Bathing: Modified independent Where Assessed-Upper Body Bathing: Shower Lower Body Bathing: Modified independent Where Assessed-Lower Body Bathing: Shower Upper Body Dressing: Modified independent (Device) Where Assessed-Upper Body Dressing: Edge of bed Lower Body Dressing: Modified independent Where Assessed-Lower Body Dressing: Edge of bed Toileting: Modified independent Where Assessed-Toileting: Teacher, Adult Education: Modified Community Education Officer Method: Ambulating, Surveyor, Minerals: Grab bars  Tub/Shower Transfer: Distant supervision Tub/Shower Transfer Method: Curator: Information systems manager with back Film/video Editor: Modified independent Film/video Editor Method: Designer, Industrial/product: Information systems manager with back Vision Baseline Vision/History: 1 Wears glasses Patient Visual Report: No change from baseline Vision Assessment?: No apparent visual deficits Eye Alignment: Within Functional Limits Ocular Range of Motion: Within Functional Limits Tracking/Visual Pursuits: Able to track stimulus in all quads without difficulty Convergence: Within functional limits Visual Fields: No apparent deficits Perception  Perception: Within Functional Limits Praxis Praxis: WFL Cognition Cognition Overall Cognitive Status: Within Functional Limits for tasks assessed Arousal/Alertness: Awake/alert Orientation Level: Person;Situation;Place Person: Oriented Place: Oriented Situation: Oriented Memory: Appears intact Attention: Focused;Sustained Focused Attention: Appears intact Sustained Attention: Appears intact Awareness: Appears intact Problem Solving: Appears intact Safety/Judgment: Appears intact Brief Interview for Mental Status (BIMS) Repetition of Three Words (First Attempt): 3 Temporal Orientation: Year: Correct Temporal Orientation: Month: Accurate within 5 days Temporal Orientation: Day: Correct Recall: Sock: Yes, no cue required Recall: Blue: Yes, no cue required Recall: Bed: Yes, no cue required BIMS Summary Score: 15 Sensation Sensation Light Touch: Appears Intact Hot/Cold: Appears Intact Proprioception: Appears Intact Coordination Gross Motor Movements are Fluid and Coordinated: Yes Fine Motor Movements are Fluid and Coordinated: Yes Heel Shin Test: [facility] Motor  Motor Motor:  (hemiparesis) Motor - Discharge Observations: significant improvement from eval with limitations in LUE d/t recent surgery Mobility  Bed Mobility Bed Mobility: Supine to Sit;Sit to Supine Supine to Sit: Independent with assistive  device Sit to Supine: Independent with assistive device Transfers Sit to Stand: Independent with assistive device Stand to Sit: Independent with assistive device  Trunk/Postural Assessment  Cervical Assessment Cervical Assessment: Within Functional Limits Thoracic Assessment Thoracic Assessment: Within Functional Limits Lumbar Assessment Lumbar Assessment: Within Functional Limits Postural Control Postural Control: Within Functional Limits Righting Reactions: delayed Protective Responses: improved from eval  Balance Balance Balance Assessed: Yes Static Sitting Balance Static Sitting - Balance Support: Feet supported Static Sitting - Level of Assistance: 7: Independent Dynamic Sitting Balance Dynamic Sitting - Balance Support: Feet supported Dynamic Sitting - Level of Assistance: 7: Independent Static Standing Balance Static Standing - Balance Support: No upper extremity supported Static Standing - Level of Assistance: 6: Modified independent (Device/Increase time) Dynamic Standing Balance Dynamic Standing - Balance Support: No upper extremity supported Dynamic Standing - Level of Assistance: 5: Stand by assistance;6: Modified independent (Device/Increase time) Extremity/Trunk Assessment RUE Assessment RUE Assessment: Within Functional Limits Passive Range of Motion (PROM) Comments: WFL Active Range of Motion (AROM) Comments: WFL General Strength Comments: 4+/5 LUE Assessment LUE Assessment: Exceptions to [facility] Passive Range of Motion (PROM) Comments: WFL Active Range of Motion (AROM) Comments: WFL General Strength Comments: 3/5MMT; limited d/t recept surgery   Katheryn SHAUNNA [place] 03/29/2024, 10:53 AM

## 2024-03-29 NOTE — Progress Notes (Signed)
 PROGRESS NOTE   Subjective/Complaints: Discussed with vascular PA, patient still intermittently using laxatives looking forward to going home tomorrow.  No other complaints today labs reviewed and are stable  ROS: +shoulder soreness, denies pain  Objective:   No results found.  Recent Labs    03/29/24 0533  WBC 11.2*  HGB 8.7*  HCT 26.4*  PLT 569*   No results for input(s): NA, K, CL, CO2, GLUCOSE, BUN, CREATININE, CALCIUM in the last 72 hours.   Intake/Output Summary (Last 24 hours) at 03/29/2024 1016 Last data filed at 03/29/2024 0800 Gross per 24 hour  Intake 713 ml  Output --  Net 713 ml        Physical Exam: Vital Signs Blood pressure (!) 128/54, pulse 86, temperature 98.7 F (37.1 C), temperature source Oral, resp. rate 20, height 5' 2 (1.575 m), weight 66.9 kg, SpO2 100%.  General: No acute distress Mood and affect are appropriate Heart: Regular rate and rhythm no rubs murmurs or extra sounds Lungs: Clear to auscultation, breathing unlabored, no rales or wheezes Abdomen: Positive bowel sounds, soft nontender to palpation, nondistended Extremities: No clubbing, cyanosis, or edema Skin: No evidence of breakdown, no evidence of rash, LUE forearm incision with dermabond , + ecchymosis MSK- no shoulder pain , mild upper trap tenderness no pain with neck ROM Spasticity: MAS 0 in all extremities.       Strength:                RUE: 5-/5 SA, 5-/5 EF, 5-/5 EE, 5-/5 WE, 5/5 FF, 5/5 FA                LUE:  5-/5 SA, 5-/5 EF, 5-/5 EE, 5-/5 WE, 5/5 FF, 5/5 FA                RLE: 5/5 HF, 5/5 KE, 5/5  DF, 5/5  EHL, 5/5  PF                 LLE:  4+/5 HF, 4+/5 KE, 5-/5  DF, 5-/5  EHL, 5-/5  PF,   Assessment/Plan: 1. Functional deficits which require 3+ hours per day of interdisciplinary therapy in a comprehensive inpatient rehab setting. Physiatrist is providing close team supervision and 24 hour  management of active medical problems listed below. Physiatrist and rehab team continue to assess barriers to discharge/monitor patient progress toward functional and medical goals  Care Tool:  Bathing    Body parts bathed by patient: Right arm, Left arm, Chest, Abdomen, Front perineal area, Buttocks, Right upper leg, Left upper leg, Right lower leg, Left lower leg, Face (requiring frequent restbreaks and CGA for standing)         Bathing assist Assist Level: Minimal Assistance - Patient > 75%     Upper Body Dressing/Undressing Upper body dressing   What is the patient wearing?: Pull over shirt    Upper body assist Assist Level: Set up assist    Lower Body Dressing/Undressing Lower body dressing      What is the patient wearing?: Underwear/pull up, Pants     Lower body assist Assist for lower body dressing: Set up assist  Toileting Toileting    Toileting assist Assist for toileting: Independent with assistive device     Transfers Chair/bed transfer  Transfers assist     Chair/bed transfer assist level: Independent with assistive device     Locomotion Ambulation   Ambulation assist      Assist level: Supervision/Verbal cueing Assistive device: No Device Max distance: 965 ft   Walk 10 feet activity   Assist     Assist level: Independent Assistive device: No Device   Walk 50 feet activity   Assist    Assist level: Supervision/Verbal cueing Assistive device: No Device    Walk 150 feet activity   Assist Walk 150 feet activity did not occur: Safety/medical concerns  Assist level: Supervision/Verbal cueing Assistive device: No Device    Walk 10 feet on uneven surface  activity   Assist Walk 10 feet on uneven surfaces activity did not occur: Safety/medical concerns   Assist level: Supervision/Verbal cueing Assistive device: Other (comment) (no device)   Wheelchair     Assist Is the patient using a wheelchair?: No (did not  use for household distances during stay, will not have on d/c) Type of Wheelchair: Manual Wheelchair activity did not occur: Refused  Wheelchair assist level: Total Assistance - Patient < 25% Max wheelchair distance: 200 ft    Wheelchair 50 feet with 2 turns activity    Assist    Wheelchair 50 feet with 2 turns activity did not occur: Refused   Assist Level: Total Assistance - Patient < 25%   Wheelchair 150 feet activity     Assist  Wheelchair 150 feet activity did not occur: Refused   Assist Level: Total Assistance - Patient < 25%   Blood pressure (!) 128/54, pulse 86, temperature 98.7 F (37.1 C), temperature source Oral, resp. rate 20, height 5' 2 (1.575 m), weight 66.9 kg, SpO2 100%.  Medical Problem List and Plan: 1. Functional deficits secondary to Multifocal CVAs s/p TEVAR with subclavian branch endoprosthesis              -patient may shower             -ELOS/Goals: 10 to 12 days, supervision PT/OT             - On admission assessment, patient with cognitive and language deficits necessitating SLP evaluation; ordered,         do not anticipate need for PMR f/u 2.  Antithrombotics: -DVT/anticoagulation:  Mechanical: Sequential compression devices, below knee Bilateral lower extremities Pharmaceutical: Other (comment) Pradaxa  150 mg BID              -antiplatelet therapy: Aspirin 81 mg    3. Pain Management: On home Flexeril  10 mg. Oxycodone and tylenol  prn    4. Mood/Behavior/Sleep: LCSW to follow for evaluation and support when available.              -antipsychotic agents: n/a   5. Neuropsych/cognition: This patient may be intermittently capable of making decisions on her own behalf.   6. Skin/Wound Care: Routine pressure relief measures.    7. Fluids/Electrolytes/Nutrition: Monitor I&O and weight. Follow up labs CBC/CMP               GERD: Protonix 40 mg daily, using prn Pepcid     8. Status post TEVAR with subclavian branch endoprosthesis:  continue Pradaxa  and Asprin    9. Stroke: MRI  showed Small acute infarcts in the bilateral frontal lobes, left occipital lobe, and left cerebellum. Neurology felt  to be embolic etiology.  Recommended resuming simvastatin  and follow up with outpatient neurology [facility].   - Simvastatin  10 mg daily resumed on inpatient rehab admission.      10.HLD: continue Simvastatin  10 mg    11. Shortness of Breath: Chest x-ray demonstrated a stable small area of atelectasis within the left lung.  O2 sats 96-99% room air             - continue Incentive spirometer   12.  Tachycardia: resolved and patient is hypotensive, d/c toprol  XL   13.  Constipation.             LBM 11/10- pt states multiple and loose but this was not documented  D/c colace, cont prn miralax   14. Anemia: Hgb stable but still low contributing to poor endurance     Latest Ref Rng & Units 03/29/2024    5:33 AM 03/26/2024    5:04 AM 03/25/2024    4:51 AM  CBC  WBC 4.0 - 10.5 K/uL 11.2  11.2  10.0   Hemoglobin 12.0 - 15.0 g/dL 8.7  8.0  8.4   Hematocrit 36.0 - 46.0 % 26.4  24.1  24.6   Platelets 150 - 400 K/uL 569  379  293     LOS: 6 days A FACE TO FACE EVALUATION WAS PERFORMED  Prentice BRAVO Terri Alexander 03/29/2024, 10:16 AM

## 2024-03-29 NOTE — Progress Notes (Signed)
 Inpatient Rehabilitation Care Coordinator Discharge Note   Patient Details  Name: Terri Alexander MRN: [id number] Date of Birth: 1951/04/28   Discharge location: HOME WITH HUSBAND WHO IS ABLE TO PROVIDE 24/7 SUPERVISION  Length of Stay: 7 DAYS  Discharge activity level: MOD/I-SUPERVISION LEVEL  Home/community participation: ACTIVE  Patient response un:Yzjouy Literacy - How often do you need to have someone help you when you read instructions, pamphlets, or other written material from your doctor or pharmacy?: Never  Patient response un:Dnrpjo Isolation - How often do you feel lonely or isolated from those around you?: Never  Services provided included: MD, RD, PT, OT, SLP, RN, CM, TR, Pharmacy, Neuropsych, SW  Financial Services:  Financial Services Utilized: Medicare    Choices offered to/list presented to: PT AND HUSBAND  Follow-up services arranged:  Outpatient, Patient/Family has no preference for HH/DME agencies    Outpatient Servicies: [facility] WILL CALL TO SET UP FOLLOW UP APPOINTMENTS    ONLY DME PT IS AGREEABLE TO IS A [facility] NO ROLLING WALKER OR OTHER PIECES OF EQUIPMENT  Patient response to transportation need: Is the patient able to respond to transportation needs?: Yes In the past 12 months, has lack of transportation kept you from medical appointments or from getting medications?: No In the past 12 months, has lack of transportation kept you from meetings, work, or from getting things needed for daily living?: No   Patient/Family verbalized understanding of follow-up arrangements:  Yes  Individual responsible for coordination of the follow-up plan: DENNIS-HUSBAND  [phone]  Confirmed correct DME delivered: Raymonde Asberry [facility] 03/29/2024    Comments (or additional information): HUSBAND WAS HERE DAILY AND OBSERVED IN THERAPIES. BOTH FEEL READY TO GO HOME.   Summary of Stay    Date/Time Discharge Planning CSW  03/28/24 [phone]  Home with husband who has been here and observed in therapies. Awaiting therapy recommendations. RGD       Khaleef Ruby G

## 2024-03-29 NOTE — Group Note (Signed)
 Patient Details Name: Terri Alexander MRN: [id number] DOB: [birth year] Today's Date: 03/29/2024  Time Calculation: OT Group Time Calculation OT Group Start Time: 1100 OT Group Stop Time: 1200 OT Group Time Calculation (min): 60 min      Group Description: Dance Group: Pt participated in dance group with an emphasis on social interaction, motor planning, increasing overall activity tolerance and bimanual tasks. All songs were selected by group members. Dance moves included AROM of BUE/BLE gross motor movements with an emphasis on building functional endurance.   Individual level documentation: Patient completed group from sitting level. Patient needed supervision to complete various dance moves with cues to initiate rest breaks.  Patient able to create her own modifications modifications during group.  Pain: Pain Assessment Pain Scale: 0-10 Pain Score: 0-No pain  Precautions: Precautions: Felton Katheryn SQUIBB [facility] 03/29/2024, 12:33 PM

## 2024-03-29 NOTE — Plan of Care (Signed)
  Problem: RH Balance Goal: LTG Patient will maintain dynamic standing with ADLs (OT) Description: LTG:  Patient will maintain dynamic standing balance with assist during activities of daily living (OT)  Outcome: Completed/Met   Problem: Sit to Stand Goal: LTG:  Patient will perform sit to stand in prep for activites of daily living with assistance level (OT) Description: LTG:  Patient will perform sit to stand in prep for activites of daily living with assistance level (OT) Outcome: Completed/Met   Problem: RH Bathing Goal: LTG Patient will bathe all body parts with assist levels (OT) Description: LTG: Patient will bathe all body parts with assist levels (OT) Outcome: Completed/Met   Problem: RH Dressing Goal: LTG Patient will perform upper body dressing (OT) Description: LTG Patient will perform upper body dressing with assist, with/without cues (OT). Outcome: Completed/Met Goal: LTG Patient will perform lower body dressing w/assist (OT) Description: LTG: Patient will perform lower body dressing with assist, with/without cues in positioning using equipment (OT) Outcome: Completed/Met   Problem: RH Simple Meal Prep Goal: LTG Patient will perform simple meal prep w/assist (OT) Description: LTG: Patient will perform simple meal prep with assistance, with/without cues (OT). Outcome: Completed/Met   Problem: RH Light Housekeeping Goal: LTG Patient will perform light housekeeping w/assist (OT) Description: LTG: Patient will perform light housekeeping with assistance, with/without cues (OT). Outcome: Completed/Met   Problem: RH Toilet Transfers Goal: LTG Patient will perform toilet transfers w/assist (OT) Description: LTG: Patient will perform toilet transfers with assist, with/without cues using equipment (OT) Outcome: Completed/Met   Problem: RH Tub/Shower Transfers Goal: LTG Patient will perform tub/shower transfers w/assist (OT) Description: LTG: Patient will perform tub/shower  transfers with assist, with/without cues using equipment (OT) Outcome: Completed/Met

## 2024-03-29 NOTE — Progress Notes (Signed)
  Progress Note    03/29/2024 9:39 AM  Subjective:  Doing well.  Excited to go home tomorrow   Vitals:   03/28/24 2040 03/29/24 0418  BP: (!) 129/57 (!) 128/54  Pulse: 78 86  Resp: 20 20  Temp: 98.1 F (36.7 C) 98.7 F (37.1 C)  SpO2: 97% 100%   Physical Exam: Lungs:  non labored Incisions:  L arm incision c/d/I; groin incisions healed Extremities:  feet warm and well perfused; palpable L ulnar Neurologic: A&O  CBC    Component Value Date/Time   WBC 11.2 (H) 03/29/2024 0533   RBC 2.91 (L) 03/29/2024 0533   HGB 8.7 (L) 03/29/2024 0533   HGB 13.6 11/09/2023 0842   HCT 26.4 (L) 03/29/2024 0533   HCT 42.7 11/09/2023 0842   PLT 569 (H) 03/29/2024 0533   PLT 335 11/09/2023 0842   MCV 90.7 03/29/2024 0533   MCV 94 11/09/2023 0842   MCH 29.9 03/29/2024 0533   MCHC 33.0 03/29/2024 0533   RDW 15.7 (H) 03/29/2024 0533   RDW 12.6 11/09/2023 0842   LYMPHSABS 1.4 03/25/2024 0451   MONOABS 1.2 (H) 03/25/2024 0451   EOSABS 0.0 03/25/2024 0451   BASOSABS 0.0 03/25/2024 0451    BMET    Component Value Date/Time   NA 138 03/26/2024 0504   NA 139 11/09/2023 0841   K 3.7 03/26/2024 0504   CL 108 03/26/2024 0504   CO2 20 (L) 03/26/2024 0504   GLUCOSE 111 (H) 03/26/2024 0504   BUN 10 03/26/2024 0504   BUN 21 11/09/2023 0841   CREATININE 0.54 03/26/2024 0504   CREATININE 1.13 (H) 10/03/2023 0956   CALCIUM 8.1 (L) 03/26/2024 0504   GFRNONAA >60 03/26/2024 0504   GFRNONAA 88 09/18/2019 1039   GFRAA 102 09/18/2019 1039    INR    Component Value Date/Time   INR 1.3 (H) 03/19/2024 1700     Intake/Output Summary (Last 24 hours) at 03/29/2024 0939 Last data filed at 03/29/2024 0800 Gross per 24 hour  Intake 713 ml  Output --  Net 713 ml     Assessment/Plan:  73 y.o. female is s/p TEVAR with subclavian branch endoprosthesis   Overall doing very well.  She is walking much better.  She is excited for discharge home tomorrow.  L arm incision is well appearing.  She  has a palpable L ulnar pulse and symmetrical grip strength.  Groin incisions have healed.  Continue aspirin, Pradaxa , statin.  Office will call to arrange CTA c/a/p   Donnice Sender, PA-C [facility] [phone] 03/29/2024 9:39 AM

## 2024-03-29 NOTE — Patient Care Conference (Signed)
 Inpatient RehabilitationTeam Conference and Plan of Care Update Date: 03/28/2024   Time: 10:41 AM    Patient Name: Terri Alexander      Medical Record Number: [id number]  Date of Birth: 07-03-1950 Sex: Female         Room/Bed: 4W03C/4W03C-01 Payor Info: Payor: MEDICARE / Plan: MEDICARE PART A AND B / Product Type: *No Product type* /    Admit Date/Time:  03/23/2024  4:27 PM  Primary Diagnosis:  CVA (cerebral vascular accident) [facility])  Hospital Problems: Principal Problem:   CVA (cerebral vascular accident) [facility])    Expected Discharge Date: Expected Discharge Date: 03/30/24  Team Members Present: Physician leading conference: Dr. Prentice Compton Social Worker Present: Rhoda Clement, LCSW Nurse Present: Barnie Ronde, RN PT Present: Recardo Milliner, PT OT Present: Katheryn Mines, OT SLP Present: Blaise Alderman, SLP PPS Coordinator present : Eleanor Colon, SLP     Current Status/Progress Goal Weekly Team Focus  Bowel/Bladder      Continent of bowel and bladder          Swallow/Nutrition/ Hydration               ADL's   SUP for ADLs, CGA-MIN A functional mobility, SUP-CGA transfers   MOD I   balance, coordination, ADLs, strength, endurance, functional mobility    Mobility   Bed mobility = supervision/ Mod I, transfers = supervision/ CGA, ambulation = supervision/ CGA for missteps/ balance, reaches up to 300 ft distances with and without RW, quality of gait and balance improved with use of RW, stairs require vc for focus without use of UE support, supervision with UE support - all performed with intermittent need for slight increase in LOA for balance.   supervision/ IND  Barriers: mild L hemi, intermittent coordination deficits with LE, LUE weakness, needs ability to hold increased focus to mobility /// Work on: L hemibody NMR, quality of gait, standing dynamic balance, family education    Building Services Engineer Observations                Pain     Left Shoulder pain     Pain < 4 with prns   Assess pain q shift and need for and effectiveness of prn meds   Skin      Incision left groin and left arm with skin glue   No new skin issues    Incisions healing    Discharge Planning:  Home with husband who has been here and observed in therapies. Awaiting therapy recommendations.   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  Progress limited by tachycardia and hypotension.  Patient on target to meet rehab goals: yes, currently needs supervision for ADLs and CGA for mobility.  Completes mod I for bed mobility and CGA for transfers. Working on balance, endurance and coordination.    *See Care Plan and progress notes for long and short-term goals.   Revisions to Treatment Plan:  N/A   Teaching Needs: Safety, medications, transfers, toileting, etc.   Current Barriers to Discharge: Decreased caregiver support  Possible Resolutions to Barriers: Family education OP follow up services     Medical Summary Current Status: [place]oulder pain improving , poor endurance, anemia  Barriers to Discharge: Other (comments);Symptomatic Anemia   Possible Resolutions to  Barriers/Weekly Focus: Fatigue,   Continued Need for Acute Rehabilitation Level of Care: The patient requires daily medical management by a physician with specialized training in physical medicine and rehabilitation for the following reasons: Direction of a multidisciplinary physical rehabilitation program to maximize functional independence : Yes Medical management of patient stability for increased activity during participation in an intensive rehabilitation regime.: Yes Analysis of laboratory values and/or radiology reports with any subsequent need for medication adjustment and/or medical  intervention. : Yes   I attest that I was present, lead the team conference, and concur with the assessment and plan of the team.   [name] B 03/29/2024, 11:03 AM

## 2024-03-29 NOTE — Progress Notes (Signed)
 Physical Therapy Session Note  Patient Details  Name: Terri Alexander MRN: [id number] Date of Birth: 01-Jan-1951  Today's Date: 03/29/2024 PT Individual Time: 1445-1545 PT Individual Time Calculation (min): 60 min   Short Term Goals: Week 1:  PT Short Term Goal 1 (Week 1): Pt will demonstrate improved quality of gait with less restrictive AD than RW and overall CGA/ SBA. PT Short Term Goal 2 (Week 1): Pt will ambulate at least 150 ft using less restrictive AD than RW with overall CGA/ SBA. PT Short Term Goal 3 (Week 1): Pt will perform standing balance activities with no UE support and reduced posterior LOB. PT Short Term Goal 4 (Week 1): Pt will demosntrate ability to perform bed mobility with Mod I. PT Short Term Goal 5 (Week 1): Pt will perform all standing transfers with consistent supervision.  Skilled Therapeutic Interventions/Progress Updates:      Pt sitting in the wheelchair to start - has no reports of pain. Pt is eager to DC home tomorrow, feels ready.  Sit<>stand from w/c independently without an AD. She ambulated independently from her room to the day gym, ~132ft.   Restested her BERG balance test. Scores listed below. (Pt scored 38/56 on 11/10). Pt with a 14 point improvement!  Patient demonstrates increased fall risk as noted by score of  52/56 on Berg Balance Scale.  (<36= high risk for falls, close to 100%; 37-45 significant >80%; 46-51 moderate >50%; 52-55 lower >25%)  Pt provided an HEP and printable handouts of exercises (OTAGO). Reviewed these exercises with the patient verbally and through demonstration, seated rest breaks as needed.   Access Code: [email or web address] URL: [email or web address] Date: 03/29/2024 Prepared by: Sherlean Perks  Exercises - Standing Tandem Balance with Counter Support  - 1 x daily - 7 x weekly - 3 sets - 10 reps - Mini Squat with Counter Support  - 1 x daily - 7 x weekly - 3 sets - 10 reps - Heel Raises with Counter Support  -  1 x daily - 7 x weekly - 3 sets - 10 reps - Side Stepping with Resistance at Ankles and Counter Support  - 1 x daily - 7 x weekly - 3 sets - 10 reps  Pt finished session on seated Nustep with L1 resistance with visual trail program to help distract her from the muscle soreness she's experiencing in her legs. The RN was made aware of her request for some Tylenol . Nustep completed for 10 minutes total.   Pt returned to her room and she was left sitting up in wheelchair with her needs met. Educated on her patient binder and placed HEP handouts in this. Reviewed her File of Life magnet as well.     Therapy Documentation Precautions:  Precautions Precautions: Fall Recall of Precautions/Restrictions: Intact Precaution/Restrictions Comments: L weakness Restrictions Weight Bearing Restrictions Per Provider Order: No General:      Therapy/Group: Individual Therapy  Sherlean SHAUNNA Perks 03/29/2024, 7:58 AM

## 2024-03-29 NOTE — Progress Notes (Signed)
 Physical Therapy Session Note  Patient Details  Name: ROHINI JAROSZEWSKI MRN: [id number] Date of Birth: 1951/02/24  Today's Date: 03/29/2024 PT Individual Time: 1310-1345 PT Individual Time Calculation (min): 35 min   Short Term Goals: Week 1:  PT Short Term Goal 1 (Week 1): Pt will demonstrate improved quality of gait with less restrictive AD than RW and overall CGA/ SBA. PT Short Term Goal 2 (Week 1): Pt will ambulate at least 150 ft using less restrictive AD than RW with overall CGA/ SBA. PT Short Term Goal 3 (Week 1): Pt will perform standing balance activities with no UE support and reduced posterior LOB. PT Short Term Goal 4 (Week 1): Pt will demosntrate ability to perform bed mobility with Mod I. PT Short Term Goal 5 (Week 1): Pt will perform all standing transfers with consistent supervision.  Skilled Therapeutic Interventions/Progress Updates: Pt presented in w/c agreeable to therapy. Pt denies pain. Pt transported to ortho gym for time management. Participated in dynamic balance challenges performing visual scanning - gap sequencing and visual pursuit on both compliant and non-compliant surfaces. Pt with 97% accuracy on all activities. PTA providing education on setting home up for d/c (removing rugs etc) with pt voicing understanding. Pt transported back to room at end of session and remained in w/c with call bell within reach and husband present.      Therapy Documentation Precautions:  Precautions Precautions: Fall Recall of Precautions/Restrictions: Intact Precaution/Restrictions Comments: mild L weakness, rare demo of LE dyscoordination Restrictions Weight Bearing Restrictions Per Provider Order: No General:   Vital Signs: Therapy Vitals Temp: 98.3 F (36.8 C) Temp Source: Oral Pulse Rate: 83 Resp: 20 BP: (!) 139/51 Patient Position (if appropriate): Sitting Oxygen Therapy SpO2: 100 % O2 Device: Room Air    Therapy/Group: Individual Therapy  Mileydi Milsap 03/29/2024, 4:15 PM

## 2024-03-29 NOTE — Progress Notes (Signed)
 Physical Therapy Discharge Summary  Patient Details  Name: Terri Alexander MRN: [id number] Date of Birth: [birth year]  Date of Discharge from PT service:March 29, 2024   Patient has met 9 of 9 long term goals due to improved activity tolerance, improved balance, increased strength, decreased pain, and functional use of  left upper extremity and left lower extremity.  Patient to discharge at an ambulatory level Supervision.   Patient's care partner is independent to provide the necessary physical assistance at discharge.  Reasons goals not met: na  Recommendation:  Patient will benefit from ongoing skilled PT services in neuro outpatient setting for continued progress in balance to continue to advance safe functional mobility, address ongoing impairments in strength, coordination, balance, activity tolerance, safety awareness, and to minimize fall risk.  Equipment: No equipment provided  Reasons for discharge: treatment goals met  Patient/family agrees with progress made and goals achieved: Yes  PT Discharge Precautions/Restrictions Precautions Precautions: Fall Precaution/Restrictions Comments: mild L weakness, rare demo of LE dyscoordination Restrictions Weight Bearing Restrictions Per Provider Order: No Pain Interference Pain Interference Pain Effect on Sleep: 1. Rarely or not at all Pain Interference with Therapy Activities: 1. Rarely or not at all Pain Interference with Day-to-Day Activities: 2. Occasionally Vision/Perception  Vision - History Ability to See in Adequate Light: 0 Adequate Perception Perception: Within Functional Limits Praxis Praxis: WFL  Cognition Overall Cognitive Status: Within Functional Limits for tasks assessed Arousal/Alertness: Awake/alert Orientation Level: Oriented X4 Attention: Focused;Sustained Focused Attention: Appears intact Sustained Attention: Appears intact Memory: Appears intact Awareness: Appears intact Problem Solving:  Appears intact Safety/Judgment: Appears intact Sensation Sensation Light Touch: Appears Intact Hot/Cold: Appears Intact Proprioception: Appears Intact Coordination Gross Motor Movements are Fluid and Coordinated: Yes Fine Motor Movements are Fluid and Coordinated: Yes (mild reduced coordination with UE>LE) Heel Shin Test: [facility] Motor  Motor Motor: Other (comment) (hemipareisis) Motor - Discharge Observations: significant improvement from eval with limitations in LUE d/t recent surgery  Mobility Bed Mobility Bed Mobility: Supine to Sit;Sit to Supine Supine to Sit: Independent with assistive device Sit to Supine: Independent with assistive device Transfers Transfers: Sit to Stand;Stand to Sit;Stand Pivot Transfers Sit to Stand: Independent with assistive device Stand to Sit: Independent with assistive device Stand Pivot Transfers: Supervision/Verbal cueing;Independent with assistive device Stand Pivot Transfer Details: Verbal cues for precautions/safety Transfer (Assistive device): None Locomotion  Gait Ambulation: Yes Gait Assistance: Supervision/Verbal cueing Gait Distance (Feet): 965 Feet Assistive device: None Gait Gait: Yes Gait Pattern: Within Functional Limits (rare dyscoordinated missteps but pt able to self correct) Gait velocity: grossly <0.4 m/s at eval and improved to 0.82 m/s Stairs / Additional Locomotion Stairs: Yes Stairs Assistance: Supervision/Verbal cueing Stair Management Technique: No rails;Alternating pattern;Forwards Number of Stairs: 12 Height of Stairs: 6 Ramp: Supervision/Verbal cueing Curb: Supervision/Verbal cueing Wheelchair Mobility Wheelchair Mobility: No  Trunk/Postural Assessment  Cervical Assessment Cervical Assessment: Within Functional Limits Thoracic Assessment Thoracic Assessment: Within Functional Limits Lumbar Assessment Lumbar Assessment: Within Functional Limits Postural Control Postural Control: Within Functional  Limits Protective Responses: improved from eval  Balance Balance Balance Assessed: Yes Static Sitting Balance Static Sitting - Balance Support: Feet supported Static Sitting - Level of Assistance: 7: Independent Dynamic Sitting Balance Dynamic Sitting - Balance Support: Feet supported Dynamic Sitting - Level of Assistance: 7: Independent Static Standing Balance Static Standing - Balance Support: No upper extremity supported Static Standing - Level of Assistance: 6: Modified independent (Device/Increase time) (intermittent reaching for balance) Dynamic Standing Balance Dynamic Standing - Balance Support: No  upper extremity supported Dynamic Standing - Level of Assistance: 5: Stand by assistance;6: Modified independent (Device/Increase time) Extremity Assessment      RLE Assessment General Strength Comments: grossly 4- to 4/5 LLE Assessment LLE Assessment: Within Functional Limits General Strength Comments: grossly 4 to 4+/5   Mliss DELENA Milliner PT, DPT, CSRS 03/29/2024, 9:48 AM

## 2024-03-30 ENCOUNTER — Other Ambulatory Visit (HOSPITAL_COMMUNITY): Payer: Self-pay

## 2024-03-30 DIAGNOSIS — I6310 Cerebral infarction due to embolism of unspecified precerebral artery: Secondary | ICD-10-CM | POA: Diagnosis not present

## 2024-03-30 MED ORDER — SIMETHICONE 80 MG PO CHEW: 80.0000 mg | CHEWABLE_TABLET | Freq: Four times a day (QID) | ORAL | Status: DC | PRN

## 2024-03-30 MED ORDER — SIMETHICONE 80 MG PO CHEW: 80.0000 mg | CHEWABLE_TABLET | Freq: Four times a day (QID) | ORAL | Status: AC | PRN

## 2024-03-30 NOTE — Progress Notes (Addendum)
 PROGRESS NOTE   Subjective/Complaints: No issues overnight, reviewed labs as well as vital signs.  Patient feels ready to go home today ROS: +shoulder soreness, denies pain  Objective:   No results found.  Recent Labs    03/29/24 0533  WBC 11.2*  HGB 8.7*  HCT 26.4*  PLT 569*   No results for input(s): NA, K, CL, CO2, GLUCOSE, BUN, CREATININE, CALCIUM in the last 72 hours.   Intake/Output Summary (Last 24 hours) at 03/30/2024 0948 Last data filed at 03/30/2024 0837 Gross per 24 hour  Intake 712 ml  Output --  Net 712 ml        Physical Exam: Vital Signs Blood pressure (!) 159/65, pulse 83, temperature 98 F (36.7 C), temperature source Oral, resp. rate 18, height 5' 2 (1.575 m), weight 66.9 kg, SpO2 95%.  General: No acute distress Mood and affect are appropriate Heart: Regular rate and rhythm no rubs murmurs or extra sounds Lungs: Clear to auscultation, breathing unlabored, no rales or wheezes Abdomen: Positive bowel sounds, soft nontender to palpation, nondistended Extremities: No clubbing, cyanosis, or edema Skin: No evidence of breakdown, no evidence of rash, LUE forearm incision with dermabond , + ecchymosis MSK- no shoulder pain , mild upper trap tenderness no pain with neck ROM Spasticity: MAS 0 in all extremities.       Strength:                RUE: 5-/5 SA, 5-/5 EF, 5-/5 EE, 5-/5 WE, 5/5 FF, 5/5 FA                LUE:  5-/5 SA, 5-/5 EF, 5-/5 EE, 5-/5 WE, 5/5 FF, 5/5 FA                RLE: 5/5 HF, 5/5 KE, 5/5  DF, 5/5  EHL, 5/5  PF                 LLE:  4+/5 HF, 4+/5 KE, 5-/5  DF, 5-/5  EHL, 5-/5  PF,   Assessment/Plan: 1. Functional deficits due to CVA Stable for D/C today F/u PCP in 1-2 weeks F/u VVS 2 weeks F/u Neuro 1-2 mo No PMR f/u  See D/C summary See D/C instructions  Care Tool:  Bathing    Body parts bathed by patient: Right arm, Left arm, Chest, Abdomen, Front  perineal area, Buttocks, Right upper leg, Left upper leg, Right lower leg, Left lower leg, Face         Bathing assist Assist Level: Independent with assistive device     Upper Body Dressing/Undressing Upper body dressing   What is the patient wearing?: Pull over shirt    Upper body assist Assist Level: Independent with assistive device    Lower Body Dressing/Undressing Lower body dressing      What is the patient wearing?: Underwear/pull up, Pants     Lower body assist Assist for lower body dressing: Independent with assitive device     Toileting Toileting    Toileting assist Assist for toileting: Independent with assistive device     Transfers Chair/bed transfer  Transfers assist     Chair/bed transfer assist level:  Independent with assistive device     Locomotion Ambulation   Ambulation assist      Assist level: Supervision/Verbal cueing Assistive device: No Device Max distance: 965 ft   Walk 10 feet activity   Assist     Assist level: Independent Assistive device: No Device   Walk 50 feet activity   Assist    Assist level: Supervision/Verbal cueing Assistive device: No Device    Walk 150 feet activity   Assist Walk 150 feet activity did not occur: Safety/medical concerns  Assist level: Supervision/Verbal cueing Assistive device: No Device    Walk 10 feet on uneven surface  activity   Assist Walk 10 feet on uneven surfaces activity did not occur: Safety/medical concerns   Assist level: Supervision/Verbal cueing Assistive device: Other (comment) (no device)   Wheelchair     Assist Is the patient using a wheelchair?: No (did not use for household distances during stay, will not have on d/c) Type of Wheelchair: Manual Wheelchair activity did not occur: Refused  Wheelchair assist level: Total Assistance - Patient < 25% Max wheelchair distance: 200 ft    Wheelchair 50 feet with 2 turns activity    Assist     Wheelchair 50 feet with 2 turns activity did not occur: Refused   Assist Level: Total Assistance - Patient < 25%   Wheelchair 150 feet activity     Assist  Wheelchair 150 feet activity did not occur: Refused   Assist Level: Total Assistance - Patient < 25%   Blood pressure (!) 159/65, pulse 83, temperature 98 F (36.7 C), temperature source Oral, resp. rate 18, height 5' 2 (1.575 m), weight 66.9 kg, SpO2 95%.  Medical Problem List and Plan: 1. Functional deficits secondary to Multifocal CVAs s/p TEVAR with subclavian branch endoprosthesis      D/c home today 11/14   do not anticipate need for PMR f/u 2.  Antithrombotics: -DVT/anticoagulation:  Mechanical: Sequential compression devices, below knee Bilateral lower extremities Pharmaceutical: Other (comment) Pradaxa  150 mg BID              -antiplatelet therapy: Aspirin 81 mg    3. Pain Management: On home Flexeril  10 mg. Oxycodone and tylenol  prn, follow-up with PCP for further pain management although I do not think from postsurgical standpoint she will need chronic opioids.   4. Mood/Behavior/Sleep: LCSW to follow for evaluation and support when available.              -antipsychotic agents: n/a   5. Neuropsych/cognition: This patient may be intermittently capable of making decisions on her own behalf.   6. Skin/Wound Care: Routine pressure relief measures.    7. Fluids/Electrolytes/Nutrition: Monitor I&O and weight. Follow up labs CBC/CMP               GERD: Protonix 40 mg daily, using prn Pepcid     8. Status post TEVAR with subclavian branch endoprosthesis: continue Pradaxa  and Asprin    9. Stroke: MRI  showed Small acute infarcts in the bilateral frontal lobes, left occipital lobe, and left cerebellum. 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  Recommended resuming simvastatin  and follow up with outpatient neurology [facility].   - Simvastatin  10 mg daily resumed on inpatient rehab admission.      10.HLD: continue  Simvastatin  10 mg    11. Shortness of Breath: Chest x-ray demonstrated a stable small area of atelectasis within the left lung.  O2 sats 96-99% room air             -  continue Incentive spirometer   12.  Tachycardia: resolved and patient is hypotensive, d/c toprol  XL   13.  Constipation.             LBM 11/10- pt states multiple and loose but this was not documented  D/c colace, cont prn miralax   14. Anemia: Hgb stable but still low contributing to poor endurance     Latest Ref Rng & Units 03/29/2024    5:33 AM 03/26/2024    5:04 AM 03/25/2024    4:51 AM  CBC  WBC 4.0 - 10.5 K/uL 11.2  11.2  10.0   Hemoglobin 12.0 - 15.0 g/dL 8.7  8.0  8.4   Hematocrit 36.0 - 46.0 % 26.4  24.1  24.6   Platelets 150 - 400 K/uL 569  379  293   There is also a mild persistent leukocytosis, afebrile, follow-up with PCP as outpatient LOS: 7 days A FACE TO FACE EVALUATION WAS PERFORMED  Prentice FORBES Compton 03/30/2024, 9:48 AM

## 2024-03-30 NOTE — Progress Notes (Signed)
 Inpatient Rehabilitation Admission Medication Review by a Pharmacist   A complete drug regimen review was completed for this patient to identify any potential clinically significant medication issues.   High Risk Drug Classes Is patient taking? Indication by Medication  Antipsychotic No   Anticoagulant Yes Pradaxa  - atrial fibrillation  Antibiotic No    Opioid Yes Oxycodone - pain  Antiplatelet Yes bASA - vascular stent  Hypoglycemics/insulin No    Vasoactive Medication Yes Lasix  - edema  Chemotherapy No    Other Yes Flexeril : muscle spasms PEG, docusate, Fiber cap, Senokot-S, Miralax , probiotic - constipation/gut health Simethicone: flatulence Pepcid , Prilosec- reflux   Zyrtec - allegies Tylenol , Salonpas-  pain  Simvastatin  - HLD Alendronate  - Osteo Calcium, Lysine, MVI, Vit C, Vit D, Cranberry, Biotin- supplements        Type of Medication Issue Identified Description of Issue Recommendation(s)  Drug Interaction(s) (clinically significant)        Duplicate Therapy        Allergy        No Medication Administration End Date        Incorrect Dose        Additional Drug Therapy Needed        Significant med changes from prior encounter (inform family/care partners about these prior to discharge). Metoprolol  is on hold due to hypotensive - Patient to discuss with PCP at next visit Communicate relevant medication changes to patient/family members at discharge from CIR.       Other            Clinically significant medication issues were identified that warrant physician communication and completion of prescribed/recommended actions by midnight of the next day:  No   Time spent performing this drug regimen review (minutes):  30  Thank you for allowing pharmacy to be a part of this patient's care.   Bascom JAYSON Louder, PharmD 03/30/2024 10:43 AM  **Pharmacist phone directory can be found on [email or web address] listed under [facility]**

## 2024-04-01 NOTE — Therapy (Signed)
 OUTPATIENT PHYSICAL THERAPY NEURO EVALUATION   Patient Name: Terri Alexander MRN: [id number] DOB:01/20/1951, 73 y.o., female Today's Date: 04/05/2024   PCP: [name], MD REFERRING PROVIDER: [name], NP  END OF SESSION:  PT End of Session - 04/04/24 1324     Visit Number 1    Number of Visits 17   16 visits plus eval   Date for Recertification  06/15/24    Authorization Type Medicare A&B/[employer]    Progress Note Due on Visit 10   Follow Medicare Guidelines   PT Start Time 1322   Therapist running late   PT Stop Time 1408    PT Time Calculation (min) 46 min    Equipment Utilized During Treatment Gait belt    Activity Tolerance Patient limited by fatigue    Behavior During Therapy WFL for tasks assessed/performed          Past Medical History:  Diagnosis Date   Adenomatous colon polyp    Allergy    Anemia    as a child, no current problem   Arrhythmia    Atrial fibrillation (HCC)    Blood transfusion without reported diagnosis @ 9 months   9 months old   Cataract    bilateral - MD just watching    Dysrhythmia    A. Fib   GERD (gastroesophageal reflux disease)    History of hiatal hernia    Hyperlipidemia    Osteoporosis 04/15/2016   Peripheral vascular disease    Type B Aortic Dissection   Skin cancer 2013   Skin cancer- Nose, face   Past Surgical History:  Procedure Laterality Date   ANGIOPLASTY, ARTERY, SUBCLAVIAN, ENDOVASCULAR, WITH STENT INSERTION Left 03/19/2024   Procedure: LEFT AXILLARY ARTERY STENT INSERTION;  Surgeon: [name], MD;  Location: [facility] OR;  Service: Vascular;  Laterality: Left;   ATRIAL FIBRILLATION ABLATION N/A 02/16/2023   Procedure: ATRIAL FIBRILLATION ABLATION;  Surgeon: [name], MD;  Location: [facility] INVASIVE CV LAB;  Service: Cardiovascular;  Laterality: N/A;   CARDIOVERSION N/A 09/09/2022   Procedure: CARDIOVERSION;  Surgeon: [name], MD;  Location: [facility] INVAS[facility] CV [facility];  Service: Cardiovascular;   Laterality: N/A;   CESAREAN SECTION  1981   x 1   CESAREAN SECTION     COLONOSCOPY  2016   hx polyp, tics, sigmoid colon mod tortuous Dr [name],    EMBOLECTOMY Left 03/19/2024   Procedure: EMBOLECTOMY OF LEFT BRACHIAL ARTERY;  Surgeon: [name], MD;  Location: [facility] OR;  Service: Vascular;  Laterality: Left;   EYE SURGERY Bilateral    retina repair x 2   FEMORAL ARTERY EXPLORATION Left 03/19/2024   Procedure: EXPOSURE OF BRACHIAL ARTERY WITH STENT INSERTION;  Surgeon: [name], MD;  Location: [facility] OR;  Service: Vascular;  Laterality: Left;   LAPAROTOMY  1979   removal of ovarian cyst   nissan fundiplication  05/17/1997   REPAIR OF COMPLEX TRACTION RETINAL DETACHMENT Bilateral 01/2019   SVT ABLATION N/A 12/09/2023   Procedure: SVT ABLATION;  Surgeon: [name], MD;  Location: [facility] CV [facility];  Service: Cardiovascular;  Laterality: N/A;   THORACIC AORTIC ENDOVASCULAR STENT GRAFT Left 03/19/2024   Procedure: INSERTION, ENDOVASCULAR STENT GRAFT, AORTA, THORACIC; STENTING X2 OF LEFT SUBCLAVIAN ARTERY;  Surgeon: [name], MD;  Location: [facility] OR;  Service: Vascular;  Laterality: Left;   ULTRASOUND GUIDANCE FOR VASCULAR ACCESS Bilateral 03/19/2024   Procedure: ULTRASOUND GUIDANCE, FOR VASCULAR ACCESS OF BILATERAL  FEMORAL AND LEFT RADIAL ARTERY;  Surgeon: [name], MD;  Location: [facility] OR;  Service: Vascular;  Laterality: Bilateral;   Patient Active Problem List   Diagnosis Date Noted   CVA (cerebral vascular accident) (HCC) 03/23/2024   S/P AAA (abdominal aortic aneurysm) repair 03/19/2024   Thoracic aortic aneurysm, without rupture, unspecified 03/19/2024   Persistent atrial fibrillation (HCC) 08/25/2022   Hypercoagulable state due to persistent atrial fibrillation (HCC) 08/25/2022   Atrial fibrillation with RVR (HCC) 08/18/2022   Aortic dissection (HCC) 08/17/2022   Aortic dissection distal to left subclavian (HCC) 08/17/2022   Diverticulosis 04/19/2022    Diverticulosis of sigmoid colon 04/19/2022   Palpitations 06/24/2021   Premature atrial contraction 06/24/2021   PVC (premature ventricular contraction) 06/24/2021   Abnormal EKG 06/24/2021   Dyslipidemia 03/24/2018   Postmenopausal estrogen deficiency 03/24/2018   Squamous cell carcinoma 08/10/2017   HSV-1 (herpes simplex virus 1) infection 03/16/2017   Osteoporosis 04/15/2016   Vulvar atrophy 11/07/2014   Abnormal Pap smear of cervix 11/07/2014    ONSET DATE: 03/28/2024 (date of referral)  REFERRING DIAG: I63.10 (ICD-10-CM) - Cerebrovascular accident (CVA) due to embolism of precerebral artery (HCC)  THERAPY DIAG:  Other abnormalities of gait and mobility  Muscle weakness (generalized)  Generalized edema  Other lack of coordination  Rationale for Evaluation and Treatment: Rehabilitation  SUBJECTIVE: Likes to be called Terri Alexander.                                                                                                                                                                                           SUBJECTIVE STATEMENT: Pt arrived ambulating with SPC.  Pt reports still feeling weak and not having a lot of stamina.  Does ok in home (can hold onto furniture if needed).  Has balance issues.  Pt reports finding out she has a UTI and anemia since discharge from inpatient rehab.  Had balance issues and h/o L knee giving out prior surgery. Pt accompanied by: self and significant other (Husband [name] drove pt).  PERTINENT HISTORY: Multifocal CVA's s/p TEVAR with subclavian branch endoprosthesis 03/19/24 (LUE and B groin incisions) to repair thoracic/abdominal aortic aneurysm (chronic type B aortic dissection with aneurysmal degeneration of the descending aorta).  Case complicated by dissection of the left subclavian artery with need for stenting of the axillary and brachial arteries to reestablish flow to left upper extremity. Pt with episode of transient aphasia that lasted a  couple of minutes 03/20/24; episode of unsteadiness while walking 03/21/24.  03/21/24 imaging: Small acute infarcts: B frontal lobes, L occipital lobe, and L cerebellum.  Inpatient rehab stay 03/23/24-03/30/24.  PMH  also includes anemia, a-fib, arrhythmia, h/o hiatal hernia, PVD (type B aortic dissection), skin CA (nose, face).  PAIN:  Are you having pain? Yes: NPRS scale: 1/10 Pain location: B shoulders Pain description: muscle spasms R side Aggravating factors: unknown Relieving factors: heat; salon pass (patches)  PRECAUTIONS: Fall; no lifting anything heavy with L UE (less than gallon of milk) per pt  RED FLAGS: None   WEIGHT BEARING RESTRICTIONS: No  FALLS: Has patient fallen in last 6 months? No  LIVING ENVIRONMENT: Lives with: lives with their family (husband) Lives in: House/apartment (townhouse; 1 level) Stairs: Yes: External: 1 steps; none Has following equipment at home: Single point cane, shower chair, and Grab bars  PLOF: Independent prior to surgery.  CGA/SBA ambulation without AD in IP rehab.  PATIENT GOALS: Get strength back.  Improve balance.  OBJECTIVE:  Note: Objective measures were completed at Evaluation unless otherwise noted.  DIAGNOSTIC FINDINGS:  Per MRI Brain 03/21/24: 1. Small acute infarcts in the bilateral frontal lobes, left occipital lobe, and left cerebellum. Given involvement of multiple vascular territories, consider embolic etiology.  COGNITION: Overall cognitive status: Within functional limits for tasks assessed   SENSATION: WFL (light touch)  COORDINATION: Good B heel to shin coordination in sitting.  Difficulty with rapid alternating toe taps in sitting B.  EDEMA:  General LE swelling (has had since surgery)  MUSCLE TONE: WNL  POSTURE: rounded shoulders and forward head  LOWER EXTREMITY MMT:    MMT Right Eval Left Eval  Hip flexion 4/5 4/5  Hip extension    Hip abduction    Hip adduction    Hip internal rotation    Hip  external rotation    Knee flexion 4/5 4/5  Knee extension 4+/5 4+/5  Ankle dorsiflexion 4+/5 4+/5  Ankle plantarflexion At least 3/5 AROM At least 3/5 AROM  Ankle inversion    Ankle eversion    (Blank rows = not tested)  BED MOBILITY:  Independent  TRANSFERS: Sit to stand: Modified independence  Assistive device utilized: None     Stand to sit: Modified independence  Assistive device utilized: None      GAIT: Findings: Gait Characteristics: step through gait pattern, Distance walked: clinic distances, Assistive device utilized:None, Level of assistance: SBA, and Comments: no loss of balance ambulating without AD use; pt appearing fatigued though after  FUNCTIONAL TESTS:  5 times sit to stand: 12.09 seconds 10 meter walk test: 0.87 m/sec (11.38 seconds); HR 104 bpm post activity SLS: R LE 5.10 seconds; L LE unable to perform without assist for balance (52/56 on BERG 03/29/24 in inpatient rehab)  PATIENT SURVEYS:  TBA  VITALS: Vitals:   04/04/24 1337  BP: 114/60  Pulse: 98                                                                                                                               TREATMENT DATE: 04/04/24   Self Care: Pt  education (see pt education below for details).  PATIENT EDUCATION: Education details: Eval results; POC.  Consider scheduling OT eval (has referral; pt reports not sure she needs it; wants to think about it).  Pacing, activity modification, and energy conservation.  Continue HEP from inpatient rehab (pt reports having HEP handout already). Person educated: Patient and Spouse Education method: Explanation and Verbal cues Education comprehension: verbalized understanding, verbal cues required, and needs further education  HOME EXERCISE PROGRAM: Continue HEP from inpatient rehab per pt tolerance: Access Code: [email or web address] URL: [email or web address]  Exercises - Standing Tandem Balance with Counter Support  - 1 x daily -  7 x weekly - 3 sets - 10 reps - Mini Squat with Counter Support  - 1 x daily - 7 x weekly - 3 sets - 10 reps - Heel Raises with Counter Support  - 1 x daily - 7 x weekly - 3 sets - 10 reps - Side Stepping with Resistance at Ankles and Counter Support  - 1 x daily - 7 x weekly - 3 sets - 10 reps  GOALS: Goals reviewed with patient? Yes  SHORT TERM GOALS: Target date: 05/02/2024  Pt will be independent with initial HEP in order to improve strength and balance in order to decrease fall risk and improve function at home for ADL's. Baseline:  TBA Goal status: INITIAL  2.  Assess FGA and update LTG as needed. Baseline: TBA Goal status: INITIAL  3.  Assess and update LTG as needed. Baseline: TBA Goal status: INITIAL  4.  Pt will increase by at least 0.13 m/s in order to demonstrate clinically significant improvement in community ambulation.  Baseline: 0.87 m/sec Goal status: INITIAL  LONG TERM GOALS: Target date: 05/30/2024  Pt will be independent with final HEP in order to improve strength and balance in order to decrease fall risk and improve function at home for ADL's. Baseline:  Goal status: INITIAL  2.  Patient will increase Functional Gait Assessment score to > or equal to 22/30 as to reduce fall risk and improve dynamic gait safety with community ambulation. Baseline: TBA Goal status: INITIAL  3.  Patient will tolerate 10 seconds of single leg stance B LE's without loss of balance to improve ability to get in and out of shower safely. Baseline: R LE 5.10 seconds; L LE unable to perform without assist for balance Goal status: INITIAL  4.  Pt will increase by at least 55m (176ft) in order to demonstrate clinically significant improvement in cardiopulmonary endurance and community ambulation. Baseline: TBA Goal status: INITIAL   ASSESSMENT:  CLINICAL IMPRESSION: Patient is a 73 y.o. female who was seen today for physical therapy evaluation and treatment for  CVA.  Patient presents with impairments with balance, generalized weakness, LE coordination, and aerobic tolerance. These impairments are limiting patient from daily and community activities.  Evaluation included the following assessment tools: 5 time sit to stand, , and SLS.  Pt scored 12.09 seconds on the 5 time sit to stand test indicating pt is not at increased risk of falls (>15 seconds = increased risk of falls).  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  Pt's SLS R LE was 5.10 seconds and unable to perform SLS on L LE without assist for balance.  Pt fatigued with sessions activities demonstrating impaired activity tolerance and endurance.   Patient  will benefit from skilled PT to address noted impairments, improve overall function, and progress towards long term goals.  OBJECTIVE IMPAIRMENTS: Abnormal gait, cardiopulmonary status limiting activity, decreased activity tolerance, decreased balance, decreased coordination, decreased endurance, decreased knowledge of condition, decreased knowledge of use of DME, decreased mobility, difficulty walking, decreased strength, improper body mechanics, postural dysfunction, and pain.   ACTIVITY LIMITATIONS: carrying, lifting, bending, squatting, stairs, bathing, toileting, dressing, and locomotion level  PARTICIPATION LIMITATIONS: meal prep, cleaning, laundry, driving, shopping, community activity, and yard work  PERSONAL FACTORS: Age, Past/current experiences, Transportation, and 1-2 comorbidities: a-fib and PVD are also affecting patient's functional outcome.   REHAB POTENTIAL: Good  CLINICAL DECISION MAKING: Evolving/moderate complexity  EVALUATION COMPLEXITY: Moderate  PLAN:  PT FREQUENCY: 2x/week  PT DURATION: 8 weeks  PLANNED INTERVENTIONS: [id number]- PT Re-evaluation, 97750- Physical Performance Testing,  97110-Therapeutic exercises, 97530- Therapeutic activity, [id number]- Neuromuscular re-education, 97535- Self Care, [id number]- Manual therapy, [id number]- Gait training, [phone]- Orthotic Initial, [phone]- Orthotic/Prosthetic subsequent, [phone]- Aquatic Therapy, [phone]- Electrical stimulation (manual), [id number]- Ultrasound, [phone] (1-2 muscles), [id number] (3+ muscles)- Dry Needling, Patient/Family education, Balance training, Stair training, Taping, Joint mobilization, Spinal mobilization, Scar mobilization, Compression bandaging, DME instructions, Cryotherapy, Moist heat, and Biofeedback  PLAN FOR NEXT SESSION: Assess FGA and (update LTG's as needed).  Update HEP.  LE strengthening; LE coordination; balance; aerobic endurance; single leg balance.   Damien Caulk, PT 04/05/2024, 8:31 AM

## 2024-04-02 ENCOUNTER — Encounter: Payer: Self-pay | Admitting: Family Medicine

## 2024-04-02 ENCOUNTER — Ambulatory Visit: Admitting: Family Medicine

## 2024-04-02 ENCOUNTER — Ambulatory Visit: Admitting: Cardiology

## 2024-04-02 VITALS — BP 110/64 | HR 86 | Temp 97.6°F | Ht 62.0 in | Wt 134.8 lb

## 2024-04-02 DIAGNOSIS — I6310 Cerebral infarction due to embolism of unspecified precerebral artery: Secondary | ICD-10-CM

## 2024-04-02 DIAGNOSIS — R300 Dysuria: Secondary | ICD-10-CM

## 2024-04-02 DIAGNOSIS — I712 Thoracic aortic aneurysm, without rupture, unspecified: Secondary | ICD-10-CM

## 2024-04-02 DIAGNOSIS — I4891 Unspecified atrial fibrillation: Secondary | ICD-10-CM | POA: Diagnosis not present

## 2024-04-02 DIAGNOSIS — Z8679 Personal history of other diseases of the circulatory system: Secondary | ICD-10-CM | POA: Diagnosis not present

## 2024-04-02 DIAGNOSIS — Z9889 Other specified postprocedural states: Secondary | ICD-10-CM | POA: Diagnosis not present

## 2024-04-02 MED ORDER — SIMVASTATIN 10 MG PO TABS: 10.0000 mg | ORAL_TABLET | Freq: Every day | ORAL | 3 refills | Status: AC

## 2024-04-02 MED ORDER — SULFAMETHOXAZOLE-TRIMETHOPRIM 800-160 MG PO TABS: 1.0000 | ORAL_TABLET | Freq: Two times a day (BID) | ORAL | 0 refills | Status: DC

## 2024-04-02 NOTE — Progress Notes (Signed)
 Subjective:    Patient ID: Terri Alexander, female    DOB: 1950/09/17, 73 y.o.   MRN: [id number]  Admission Date: 03/19/2024   Discharge Date: 03/23/2024   Physician: [name], MD   Admission Diagnosis: Aneurysm of descending thoracic aorta without rupture [I71.23] S/P AAA (abdominal aortic aneurysm) repair [S01.109, Z86.79] Thoracic aortic aneurysm, without rupture, unspecified [I71.20]   Discharge Day Diagnosis: Aneurysm of descending thoracic aorta without rupture [I71.23] S/P AAA (abdominal aortic aneurysm) repair [S01.109, Z86.79] Thoracic aortic aneurysm, without rupture, unspecified [I71.20]   Hospital Course:  The patient was admitted to the hospital and taken to the operating room on 03/19/2024 and underwent the following surgery: 1) percutaneous access bilateral common femoral arteries, 2) TEVAR with left subclavian sidebranch endoprosthesis, reinforced with VBX, 3) selective catheterization of the left radial artery, 4) left upper extremity angiogram, 5) left brachial artery cutdown with left upper extremity Fogarty embolectomy, 6) primary repair of left brachial artery and 7) left axillary, brachial, and subclavian artery stenting.  She tolerated the procedure well was transferred to the PACU in stable condition.  She was then transferred to the ICU for overnight monitoring.   POD 1 (03/20/2024) - She was doing fairly well with minimal pain.  Left upper extremity and bilateral lower extremities were well-perfused.  She had palpable pedal pulses and brisk left ulnar/radial/palmar arch Doppler signals.  All of her incisions were intact with significant bruising but no hematoma.  She was mildly tachycardic and was given 500 cc saline bolus.  Hemoglobin with slight drop from surgery, at 8.6.  Repeat hemoglobin later that evening was 9.6.  She was cleared to mobilize with PT/OT.   POD 2 (03/21/2024) - She had a dizziness event in the evening of POD 1 and rapid response was  called.  NIH was negative.  Her dizziness improved afterwards.  Again in the morning of POD 2 she had global weakness with standing and some dizziness.  MRI brain was ordered to rule out stroke.  We tried to obtain CTA of the chest, however her IV access was lost. All of her incisions remained well-appearing without evidence of further bleeding.  Her left arm remained well-perfused with brisk ulnar/radial/palmar arch Doppler signals.  Her Pradaxa  was restarted.  She was also restarted on metoprolol  for tachycardia.   POD 3 (03/22/2024) - MRI of the brain resulted with an embolic event with small, punctate infarcts involving bilateral hemispheres.  This could have been due to thrombus formation within the aorta after surgery.  Neurology was consulted.  They recommended no further intervention and continued medical management with daily aspirin and Pradaxa .  Overall her dizziness and weakness improved.  She continued to have good perfusion to both feet and her left hand.    POD 4 (03/23/2024)- Her mobility was improving with PT/OT and she was recommended to go to CIR at discharge.  She had no further dizziness or weakness events.  She had no other focal neurological deficits.  All of her incisions were well-appearing and intact without further bleeding.  She had palpable pedal pulses bilaterally.  She had brisk left ulnar/radial/palmar arch Doppler signals.  Her tachycardia had improved at rest.  Echo was also performed and was negative for decreased ventricular function or thrombus. CXR was ordered for some increased work of breathing, which demonstrated a stable, small area of atelectasis within the left lung.  Incentive spirometry was encouraged.  She was also to resume her home statin.   Overall she  was tolerating a normal diet without swallowing difficulties.  She was also mobilized PT/OT.  She was also voiding without difficulty.  She was discharged to CIR on 03/23/2024.  04/02/24 [name] was recently  admitted to repair thoracic aortic aneurysm.  Postoperative course was complicated by small embolic punctate infarcts of the brain.  She is currently on aspirin and Pradaxa .  Aspirin for the stents placed by vascular surgery and Pradaxa  to prevent embolic infarcts from atrial fibrillation.  Unfortunately, her postoperative hemoglobin dropped from 13-8.  It was 8.7 at the time of discharge.  Today she reports mild tachycardia to 106 with activity, fatigue, and shortness of breath with activity.  She denies any melena or hematochezia.  She is not taking any iron.  She does report dysuria urgency and frequency.  She denies any cough.  She does report stomach upset.  She has a history of a Niesen fundoplication.  She states that she is having dysphagia with food sticking in the distal esophagus near her stomach.  She also reports some burning pain in that area.  Of note she is on Fosamax .   Past Medical History:  Diagnosis Date   Adenomatous colon polyp    Allergy    Anemia    as a child, no current problem   Arrhythmia    Atrial fibrillation (HCC)    Blood transfusion without reported diagnosis @ 9 months   9 months old   Cataract    bilateral - MD just watching    Dysrhythmia    A. Fib   GERD (gastroesophageal reflux disease)    History of hiatal hernia    Hyperlipidemia    Osteoporosis 04/15/2016   Peripheral vascular disease    Type B Aortic Dissection   Skin cancer 2013   Skin cancer- Nose, face   Current Outpatient Medications on File Prior to Visit  Medication Sig Dispense Refill   acetaminophen  (TYLENOL ) 325 MG tablet Take 1-2 tablets (325-650 mg total) by mouth every 4 (four) hours as needed for mild pain (pain score 1-3).     alendronate  (FOSAMAX ) 70 MG tablet TAKE 1 TABLET BY MOUTH ONCE A WEEK ON AN EMPTY STOMACH WITH A FULL GLASS OF WATER 12 tablet 3   Ascorbic Acid (VITAMIN C) 1000 MG tablet Take 1,000 mg by mouth in the morning.     aspirin EC 81 MG tablet Take 1 tablet (81 mg  total) by mouth daily at 6 (six) AM. Swallow whole. 30 tablet 12   Biotin 1000 MCG tablet Take 1,000 mcg by mouth daily.     Calcium Carbonate-Vit D-Min (CALCIUM 1200 PO) Take 1 tablet by mouth in the morning.     Calcium Polycarbophil (FIBER-CAPS PO) Take 1 capsule by mouth in the morning.     cetirizine (ZYRTEC) 10 MG tablet Take 10 mg by mouth daily.     Cholecalciferol (VITAMIN D3) 125 MCG (5000 UT) TABS Take 5,000 Units by mouth in the morning.     Cranberry 500 MG CHEW Chew 500 mg by mouth in the morning.     cyclobenzaprine  (FLEXERIL ) 10 MG tablet Take 1 tablet (10 mg total) by mouth 3 (three) times daily as needed for muscle spasms. 30 tablet 1   dabigatran  (PRADAXA ) 150 MG CAPS capsule Take 1 capsule (150 mg total) by mouth 2 (two) times daily. 180 capsule 3   docusate sodium  (COLACE) 100 MG capsule Take 100 mg by mouth in the morning.     famotidine  (PEPCID )  10 MG tablet Take 1 tablet (10 mg total) by mouth 2 (two) times daily. 60 tablet 0   furosemide  (LASIX ) 40 MG tablet Take 1 tablet (40 mg total) by mouth as needed for edema. 30 tablet 0   L-Lysine 500 MG CAPS Take 500 mg by mouth in the morning.     Lidocaine  (SALONPAS PAIN RELIEVING EX) Apply 1 Application topically daily as needed (pain).     Menthol-Methyl Salicylate (SALONPAS PAIN RELIEF PATCH EX) Apply 1 patch topically daily as needed (pain).     [Paused] metoprolol  succinate (TOPROL  XL) 25 MG 24 hr tablet Take 0.5 tablets (12.5 mg total) by mouth daily. 45 tablet 3   Multiple Vitamin (MULTIVITAMIN WITH MINERALS) TABS tablet Take 1 tablet by mouth in the morning.     omeprazole (PRILOSEC OTC) 20 MG tablet Take 20 mg by mouth daily as needed (acid reflux).     oxyCODONE (OXY IR/ROXICODONE) 5 MG immediate release tablet Take 1 tablet (5 mg total) by mouth every 4 (four) hours as needed for moderate pain (pain score 4-6). 28 tablet 0   polyethylene glycol (MIRALAX  / GLYCOLAX ) 17 g packet Take 17 g by mouth daily.     Probiotic  Product (PROBIOTIC PO) Take 1 tablet by mouth in the morning.     senna-docusate (SENOKOT-S) 8.6-50 MG tablet Take 1 tablet by mouth 2 (two) times daily. 60 tablet 0   simethicone (MYLICON) 80 MG chewable tablet Chew 1 tablet (80 mg total) by mouth 4 (four) times daily as needed for flatulence.     simvastatin  (ZOCOR ) 10 MG tablet Take 1 tablet (10 mg total) by mouth at bedtime. 30 tablet 0   No current facility-administered medications on file prior to visit.   No Known Allergies Social History   Socioeconomic History   Marital status: Married    Spouse name: Not on file   Number of children: Not on file   Years of education: Not on file   Highest education level: 12th grade  Occupational History   Not on file  Tobacco Use   Smoking status: Never   Smokeless tobacco: Never   Tobacco comments:    Never smoked 12/16/23  Vaping Use   Vaping status: Never Used  Substance and Sexual Activity   Alcohol use: Yes    Alcohol/week: 1.0 standard drink of alcohol    Types: 1 Glasses of wine per week    Comment: rarely   Drug use: No   Sexual activity: Not Currently    Birth control/protection: Post-menopausal  Other Topics Concern   Not on file  Social History Narrative   Not on file   Social Drivers of Health   Financial Resource Strain: Low Risk  (11/14/2023)   Overall Financial Resource Strain (CARDIA)    Difficulty of Paying Living Expenses: Not hard at all  Food Insecurity: No Food Insecurity (03/19/2024)   Hunger Vital Sign    Worried About Running Out of Food in the Last Year: Never true    Ran Out of Food in the Last Year: Never true  Transportation Needs: No Transportation Needs (03/19/2024)   PRAPARE - Administrator, Civil Service (Medical): No    Lack of Transportation (Non-Medical): No  Physical Activity: Insufficiently Active (11/14/2023)   Exercise Vital Sign    Days of Exercise per Week: 3 days    Minutes of Exercise per Session: 30 min  Stress: No  Stress Concern Present (11/14/2023)   [employer]  of Occupational Health - Occupational Stress Questionnaire    Feeling of Stress: Only a little  Social Connections: Unknown (03/19/2024)   Social Connection and Isolation Panel    Frequency of Communication with Friends and Family: Not on file    Frequency of Social Gatherings with Friends and Family: Not on file    Attends Religious Services: 1 to 4 times per year    Active Member of [employer] or Organizations: Not on file    Attends Banker Meetings: Not on file    Marital Status: Not on file  Intimate Partner Violence: Not At Risk (03/19/2024)   Humiliation, Afraid, Rape, and Kick questionnaire    Fear of Current or Ex-Partner: No    Emotionally Abused: No    Physically Abused: No    Sexually Abused: No      Review of Systems  All other systems reviewed and are negative.      Objective:   Physical Exam Vitals reviewed.  Constitutional:      General: She is not in acute distress.    Appearance: Normal appearance. She is not ill-appearing, toxic-appearing or diaphoretic.  HENT:     Head: Normocephalic and atraumatic.     Nose: Nose normal.     Mouth/Throat:     Mouth: Mucous membranes are moist.     Pharynx: Oropharynx is clear. No oropharyngeal exudate or posterior oropharyngeal erythema.  Eyes:     Extraocular Movements: Extraocular movements intact.     Conjunctiva/sclera: Conjunctivae normal.     Pupils: Pupils are equal, round, and reactive to light.  Neck:     Vascular: No carotid bruit.  Cardiovascular:     Rate and Rhythm: Normal rate and regular rhythm.     Pulses: Normal pulses.     Heart sounds: Normal heart sounds. No murmur heard.    No friction rub. No gallop.  Pulmonary:     Effort: Pulmonary effort is normal. No respiratory distress.     Breath sounds: No stridor. No wheezing, rhonchi or rales.  Chest:     Chest wall: No tenderness.  Abdominal:     General: Abdomen is flat. Bowel  sounds are normal. There is no distension.     Palpations: Abdomen is soft. There is no mass.     Tenderness: There is no abdominal tenderness. There is no guarding or rebound.     Hernia: No hernia is present.  Musculoskeletal:     Cervical back: Normal range of motion and neck supple. No tenderness.     Right lower leg: No edema.     Left lower leg: No edema.  Lymphadenopathy:     Cervical: No cervical adenopathy.  Skin:    General: Skin is warm.     Coloration: Skin is not jaundiced or pale.     Findings: No bruising, erythema, lesion or rash.  Neurological:     General: No focal deficit present.     Mental Status: She is alert and oriented to person, place, and time. Mental status is at baseline.     Cranial Nerves: No cranial nerve deficit.     Sensory: No sensory deficit.     Motor: No weakness.     Coordination: Coordination normal.     Gait: Gait normal.     Deep Tendon Reflexes: Reflexes normal.  Psychiatric:        Mood and Affect: Mood normal.        Behavior: Behavior normal.  Thought Content: Thought content normal.        Judgment: Judgment normal.           Assessment & Plan:  Atrial fibrillation, unspecified type (HCC) - Plan: CBC with Differential/Platelet, Comprehensive metabolic panel with GFR, Iron  Dysuria - Plan: Urinalysis, Routine w reflex microscopic  S/P AAA (abdominal aortic aneurysm) repair  Thoracic aortic aneurysm without rupture, unspecified part  Cerebrovascular accident (CVA) due to embolism of precerebral artery (HCC) Biggest issue appears to be postoperative anemia.  I will check a CBC today.  If hemoglobin is 8.5 or higher, I feel that this rules out ongoing blood loss.  I would recommend ferrous sulfate 325 mg daily until hemoglobin improves.  If hemoglobin is dropping, I would be concerned about possible upper GI bleed due to the combination of aspirin as well as Pradaxa .  Temporarily continue to hold metoprolol  due to soft  blood pressure.  The patient is in normal sinus rhythm and does not need rate control at the present time.  I do not want to drop her blood pressure further until I am sure that she is not losing blood.  There is no indication today on her exam for Lasix .  Discontinue Fosamax  temporarily given the dysphagia.  Increase Prilosec to 40 mg a day.  If not improving, we will need to consider an EGD however I do not want to stop her blood thinners given her recent stroke unless there is concern over bleeding

## 2024-04-03 ENCOUNTER — Ambulatory Visit: Payer: Self-pay | Admitting: Family Medicine

## 2024-04-03 LAB — COMPREHENSIVE METABOLIC PANEL WITH GFR
AG Ratio: 1.2 (calc) (ref 1.0–2.5)
ALT: 21 U/L (ref 6–29)
AST: 19 U/L (ref 10–35)
Albumin: 3.9 g/dL (ref 3.6–5.1)
Alkaline phosphatase (APISO): 83 U/L (ref 37–153)
BUN: 17 mg/dL (ref 7–25)
CO2: 26 mmol/L (ref 20–32)
Calcium: 9.3 mg/dL (ref 8.6–10.4)
Chloride: 103 mmol/L (ref 98–110)
Creat: 0.68 mg/dL (ref 0.60–1.00)
Globulin: 3.2 g/dL (ref 1.9–3.7)
Glucose, Bld: 93 mg/dL (ref 65–99)
Potassium: 5.2 mmol/L (ref 3.5–5.3)
Sodium: 141 mmol/L (ref 135–146)
Total Bilirubin: 0.5 mg/dL (ref 0.2–1.2)
Total Protein: 7.1 g/dL (ref 6.1–8.1)
eGFR: 92 mL/min/1.73m2 (ref >=60)

## 2024-04-03 LAB — MICROSCOPIC MESSAGE

## 2024-04-03 LAB — URINALYSIS, ROUTINE W REFLEX MICROSCOPIC
Bilirubin Urine: NEGATIVE
Glucose, UA: NEGATIVE
Ketones, ur: NEGATIVE
Nitrite: NEGATIVE
Specific Gravity, Urine: 1.022 (ref 1.001–1.035)
pH: 5.5 (ref 5.0–8.0)

## 2024-04-03 LAB — CBC WITH DIFFERENTIAL/PLATELET
Absolute Lymphocytes: 1848 {cells}/uL (ref 850–3900)
Absolute Monocytes: 1307 {cells}/uL — ABNORMAL HIGH (ref 200–950)
Basophils Absolute: 66 {cells}/uL (ref 0–200)
Basophils Relative: 0.5 %
Eosinophils Absolute: 0 {cells}/uL — ABNORMAL LOW (ref 15–500)
Eosinophils Relative: 0 %
HCT: 32.2 % — ABNORMAL LOW (ref 35.0–45.0)
Hemoglobin: 10.1 g/dL — ABNORMAL LOW (ref 11.7–15.5)
MCH: 28.9 pg (ref 27.0–33.0)
MCHC: 31.4 g/dL — ABNORMAL LOW (ref 32.0–36.0)
MCV: 92.3 fL (ref 80.0–100.0)
MPV: 8.8 fL (ref 7.5–12.5)
Monocytes Relative: 9.9 %
Neutro Abs: 9979 {cells}/uL — ABNORMAL HIGH (ref 1500–7800)
Neutrophils Relative %: 75.6 %
Platelets: 866 Thousand/uL — ABNORMAL HIGH (ref 140–400)
RBC: 3.49 Million/uL — ABNORMAL LOW (ref 3.80–5.10)
RDW: 13.6 % (ref 11.0–15.0)
Total Lymphocyte: 14 %
WBC: 13.2 Thousand/uL — ABNORMAL HIGH (ref 3.8–10.8)

## 2024-04-03 LAB — IRON: Iron: 38 ug/dL — ABNORMAL LOW (ref 45–160)

## 2024-04-04 ENCOUNTER — Ambulatory Visit: Attending: Student | Admitting: Physical Therapy

## 2024-04-04 ENCOUNTER — Other Ambulatory Visit: Payer: Self-pay

## 2024-04-04 ENCOUNTER — Encounter: Payer: Self-pay | Admitting: Physical Therapy

## 2024-04-04 VITALS — BP 114/60 | HR 98

## 2024-04-04 DIAGNOSIS — M6281 Muscle weakness (generalized): Secondary | ICD-10-CM | POA: Insufficient documentation

## 2024-04-04 DIAGNOSIS — R278 Other lack of coordination: Secondary | ICD-10-CM | POA: Insufficient documentation

## 2024-04-04 DIAGNOSIS — R2689 Other abnormalities of gait and mobility: Secondary | ICD-10-CM | POA: Diagnosis not present

## 2024-04-04 DIAGNOSIS — I6310 Cerebral infarction due to embolism of unspecified precerebral artery: Secondary | ICD-10-CM | POA: Diagnosis not present

## 2024-04-04 DIAGNOSIS — R601 Generalized edema: Secondary | ICD-10-CM | POA: Insufficient documentation

## 2024-04-12 ENCOUNTER — Encounter (HOSPITAL_BASED_OUTPATIENT_CLINIC_OR_DEPARTMENT_OTHER): Payer: Self-pay

## 2024-04-12 ENCOUNTER — Other Ambulatory Visit: Payer: Self-pay

## 2024-04-12 ENCOUNTER — Emergency Department (HOSPITAL_BASED_OUTPATIENT_CLINIC_OR_DEPARTMENT_OTHER)
Admission: EM | Admit: 2024-04-12 | Discharge: 2024-04-12 | Disposition: A | Discharge status: Home / Self Care | Attending: Emergency Medicine | Admitting: Emergency Medicine

## 2024-04-12 DIAGNOSIS — N3000 Acute cystitis without hematuria: Secondary | ICD-10-CM | POA: Diagnosis not present

## 2024-04-12 DIAGNOSIS — L270 Generalized skin eruption due to drugs and medicaments taken internally: Secondary | ICD-10-CM | POA: Diagnosis not present

## 2024-04-12 DIAGNOSIS — R21 Rash and other nonspecific skin eruption: Secondary | ICD-10-CM | POA: Diagnosis not present

## 2024-04-12 HISTORY — DX: Cerebral infarction, unspecified: I63.9

## 2024-04-12 LAB — CBC WITH DIFFERENTIAL/PLATELET
Abs Immature Granulocytes: 0.03 K/uL (ref 0.00–0.07)
Basophils Absolute: 0 K/uL (ref 0.0–0.1)
Basophils Relative: 0 %
Eosinophils Absolute: 0 K/uL (ref 0.0–0.5)
Eosinophils Relative: 0 %
HCT: 32 % — ABNORMAL LOW (ref 36.0–46.0)
Hemoglobin: 10.1 g/dL — ABNORMAL LOW (ref 12.0–15.0)
Immature Granulocytes: 0 %
Lymphocytes Relative: 19 %
Lymphs Abs: 1.5 K/uL (ref 0.7–4.0)
MCH: 29.3 pg (ref 26.0–34.0)
MCHC: 31.6 g/dL (ref 30.0–36.0)
MCV: 92.8 fL (ref 80.0–100.0)
Monocytes Absolute: 0.9 K/uL (ref 0.1–1.0)
Monocytes Relative: 12 %
Neutro Abs: 5.2 K/uL (ref 1.7–7.7)
Neutrophils Relative %: 69 %
Platelets: 406 K/uL — ABNORMAL HIGH (ref 150–400)
RBC: 3.45 MIL/uL — ABNORMAL LOW (ref 3.87–5.11)
RDW: 15.8 % — ABNORMAL HIGH (ref 11.5–15.5)
WBC: 7.7 K/uL (ref 4.0–10.5)
nRBC: 0 % (ref 0.0–0.2)

## 2024-04-12 LAB — COMPREHENSIVE METABOLIC PANEL WITH GFR
ALT: 31 U/L (ref 0–44)
AST: 32 U/L (ref 15–41)
Albumin: 3.9 g/dL (ref 3.5–5.0)
Alkaline Phosphatase: 102 U/L (ref 38–126)
Anion gap: 11 (ref 5–15)
BUN: 19 mg/dL (ref 8–23)
CO2: 25 mmol/L (ref 22–32)
Calcium: 9.5 mg/dL (ref 8.9–10.3)
Chloride: 104 mmol/L (ref 98–111)
Creatinine, Ser: 0.58 mg/dL (ref 0.44–1.00)
GFR, Estimated: >60 mL/min (ref >60)
Glucose, Bld: 105 mg/dL — ABNORMAL HIGH (ref 70–99)
Potassium: 4.6 mmol/L (ref 3.5–5.1)
Sodium: 139 mmol/L (ref 135–145)
Total Bilirubin: 0.4 mg/dL (ref 0.0–1.2)
Total Protein: 7.4 g/dL (ref 6.5–8.1)

## 2024-04-12 LAB — URINALYSIS, ROUTINE W REFLEX MICROSCOPIC
Bilirubin Urine: NEGATIVE
Glucose, UA: NEGATIVE mg/dL
Ketones, ur: NEGATIVE mg/dL
Nitrite: NEGATIVE
Protein, ur: NEGATIVE mg/dL
Specific Gravity, Urine: 1.008 (ref 1.005–1.030)
WBC, UA: >50 WBC/hpf (ref 0–5)
pH: 6 (ref 5.0–8.0)

## 2024-04-12 MED ORDER — TRIAMCINOLONE ACETONIDE 0.1 % EX CREA: TOPICAL_CREAM | CUTANEOUS | Status: DC

## 2024-04-12 MED ORDER — CEPHALEXIN 500 MG PO CAPS: 500.0000 mg | ORAL_CAPSULE | Freq: Two times a day (BID) | ORAL | 0 refills | Status: AC

## 2024-04-12 MED ORDER — TRIAMCINOLONE ACETONIDE 0.1 % EX CREA: 1.0000 | TOPICAL_CREAM | Freq: Two times a day (BID) | CUTANEOUS | 0 refills | Status: AC

## 2024-04-12 NOTE — Discharge Instructions (Addendum)
 You were seen for your urinary tract infection in the emergency department.   At home, please take the keflex  for your UTI. Take the triamcinolone  for your rash. You may take benadryl  at night and zyrtec during the day.    Check your MyChart online for the results of any tests that had not resulted by the time you left the emergency department.   Follow-up with your primary doctor in 2-3 days regarding your visit.    Return immediately to the emergency department if you experience any of the following: worsening rash, fever, flank pain, or any other concerning symptoms.    Thank you for visiting our Emergency Department. It was a pleasure taking care of you today.

## 2024-04-12 NOTE — ED Triage Notes (Signed)
 She c/o widely disseminated red, flat patchy rash which she theorizes may be caused by the taking of Bactrim  for UTI. She also c/o a persistent feeling of bladder fullness. She denies fever/n/v/d and is in no distress.

## 2024-04-12 NOTE — ED Provider Notes (Addendum)
 [place] EMERGENCY DEPARTMENT AT [facility] Provider Note   CSN: [id number] Arrival date & time: 04/12/24  9243     Patient presents with: Rash   Terri Alexander is a 73 y.o. female.   73 year old female who presents to the emergency department with rash.  Patient reports that on 11/17 she started feeling fullness and urgency in her bladder.  Was started on Bactrim  and reports that she finished it on Sunday.  Last night developed an erythematous rash on her arms, chest, and thighs.  Not present in the mouth or vagina.  Does not itch.  It is not painful.  Says that she took some Benadryl  and it appears to be improving at this point in time.  No fevers or chills.  No flank pain.  No nausea or vomiting.  Concerned that the Bactrim  may have caused the rash.  No new lotions or creams.  No known allergies.       Prior to Admission medications   Medication Sig Start Date End Date Taking? Authorizing Provider  cephALEXin  (KEFLEX ) 500 MG capsule Take 1 capsule (500 mg total) by mouth 2 (two) times daily for 7 days. 04/12/24 04/19/24 Yes Yolande Lamar BROCKS, MD  triamcinolone  cream (KENALOG ) 0.1 % Apply 1 Application topically 2 (two) times daily. 04/12/24  Yes Yolande Lamar BROCKS, MD  acetaminophen  (TYLENOL ) 325 MG tablet Take 1-2 tablets (325-650 mg total) by mouth every 4 (four) hours as needed for mild pain (pain score 1-3). 03/28/24   [name], NP  alendronate  (FOSAMAX ) 70 MG tablet TAKE 1 TABLET BY MOUTH ONCE A WEEK ON AN EMPTY STOMACH WITH A FULL GLASS OF WATER 07/11/23   [name], MD  Ascorbic Acid (VITAMIN C) 1000 MG tablet Take 1,000 mg by mouth in the morning.    [provider]  aspirin  EC 81 MG tablet Take 1 tablet (81 mg total) by mouth daily at 6 (six) AM. Swallow whole. 03/24/24   Schuh, McKenzi P, PA-C  Biotin 1000 MCG tablet Take 1,000 mcg by mouth daily.    [provider]  Calcium Carbonate-Vit D-Min (CALCIUM 1200 PO) Take 1 tablet by  mouth in the morning.    [provider]  Calcium Polycarbophil (FIBER-CAPS PO) Take 1 capsule by mouth in the morning.    [provider]  cetirizine (ZYRTEC) 10 MG tablet Take 10 mg by mouth daily.    [provider]  Cholecalciferol (VITAMIN D3) 125 MCG (5000 UT) TABS Take 5,000 Units by mouth in the morning.    [provider]  Cranberry 500 MG CHEW Chew 500 mg by mouth in the morning.    [provider]  cyclobenzaprine  (FLEXERIL ) 10 MG tablet Take 1 tablet (10 mg total) by mouth 3 (three) times daily as needed for muscle spasms. 11/14/23   Aletha Bene, MD  dabigatran  (PRADAXA ) 150 MG CAPS capsule Take 1 capsule (150 mg total) by mouth 2 (two) times daily. 03/12/24   [name], MD  docusate sodium  (COLACE) 100 MG capsule Take 100 mg by mouth in the morning.    [provider]  famotidine  (PEPCID ) 10 MG tablet Take 1 tablet (10 mg total) by mouth 2 (two) times daily. 03/28/24   [name], NP  furosemide  (LASIX ) 40 MG tablet Take 1 tablet (40 mg total) by mouth as needed for edema. 03/28/24   [name], NP  L-Lysine 500 MG CAPS Take 500 mg by mouth in the  morning.    [provider]  Lidocaine  (SALONPAS PAIN RELIEVING EX) Apply 1 Application topically daily as needed (pain).    [provider]  Menthol-Methyl Salicylate (SALONPAS PAIN RELIEF PATCH EX) Apply 1 patch topically daily as needed (pain).    [provider]  metoprolol  succinate (TOPROL  XL) 25 MG 24 hr tablet Take 0.5 tablets (12.5 mg total) by mouth daily. 03/12/24   [name], MD  Multiple Vitamin (MULTIVITAMIN WITH MINERALS) TABS tablet Take 1 tablet by mouth in the morning.    [provider]  omeprazole  (PRILOSEC  OTC) 20 MG tablet Take 20 mg by mouth daily as needed (acid reflux).    [provider]  oxyCODONE  (OXY IR/ROXICODONE ) 5 MG immediate release tablet Take 1 tablet (5 mg total) by mouth  every 4 (four) hours as needed for moderate pain (pain score 4-6). 03/28/24   [name], NP  polyethylene glycol (MIRALAX  / GLYCOLAX ) 17 g packet Take 17 g by mouth daily. 03/29/24   [name], NP  Probiotic Product (PROBIOTIC PO) Take 1 tablet by mouth in the morning.    [provider]  senna-docusate (SENOKOT-S) 8.6-50 MG tablet Take 1 tablet by mouth 2 (two) times daily. 03/28/24   [name], NP  simethicone  (MYLICON) 80 MG chewable tablet Chew 1 tablet (80 mg total) by mouth 4 (four) times daily as needed for flatulence. 03/30/24   [name], NP  simvastatin  (ZOCOR ) 10 MG tablet Take 1 tablet (10 mg total) by mouth at bedtime. 04/02/24   [name], MD  sulfamethoxazole -trimethoprim  (BACTRIM  DS) 800-160 MG tablet Take 1 tablet by mouth 2 (two) times daily. 04/02/24   [name], MD    Allergies: Patient has no known allergies.    Review of Systems  Updated Vital Signs BP (!) 173/63   Pulse 79   Temp 97.6 F (36.4 C) (Oral)   Resp 16   LMP  (LMP Unknown)   SpO2 100%   Physical Exam Vitals and nursing note reviewed.  Constitutional:      General: She is not in acute distress.    Appearance: She is well-developed.  HENT:     Head: Normocephalic and atraumatic.     Right Ear: External ear normal.     Left Ear: External ear normal.     Nose: Nose normal.  Eyes:     Extraocular Movements: Extraocular movements intact.     Conjunctiva/sclera: Conjunctivae normal.     Pupils: Pupils are equal, round, and reactive to light.  Abdominal:     General: Abdomen is flat. There is no distension.     Palpations: Abdomen is soft. There is no mass.     Tenderness: There is no abdominal tenderness. There is no guarding.  Musculoskeletal:     Cervical back: Normal range of motion and neck supple.     Right lower leg: No edema.     Left lower leg: No edema.  Skin:    General: Skin is warm and dry.     Findings: Rash (Blanching  maculopapular rash.  Present on the chest, arms, and anterior thighs.  No desquamation noted.  Negative Nikolsky sign.  Not present in the oropharynx or on the palms or soles) present.  Neurological:     Mental Status: She is alert and oriented to person, place, and time. Mental status is at baseline.  Psychiatric:        Mood and Affect: Mood  normal.     (all labs ordered are listed, but only abnormal results are displayed) Labs Reviewed  URINALYSIS, ROUTINE W REFLEX MICROSCOPIC - Abnormal; Notable for the following components:      Result Value   APPearance HAZY (*)    Hgb urine dipstick SMALL (*)    Leukocytes,Ua LARGE (*)    Bacteria, UA MANY (*)    Non Squamous Epithelial 0-5 (*)    All other components within normal limits  CBC WITH DIFFERENTIAL/PLATELET - Abnormal; Notable for the following components:   RBC 3.45 (*)    Hemoglobin 10.1 (*)    HCT 32.0 (*)    RDW 15.8 (*)    Platelets 406 (*)    All other components within normal limits  COMPREHENSIVE METABOLIC PANEL WITH GFR - Abnormal; Notable for the following components:   Glucose, Bld 105 (*)    All other components within normal limits  URINE CULTURE    EKG: None  Radiology: No results found.   Procedures   Medications Ordered in the ED - No data to display                                   Medical Decision Making Amount and/or Complexity of Data Reviewed Labs: ordered.  Risk Prescription drug management.   73 year old female recently diagnosed with UTI treated with Bactrim  who presents with a rash  Initial Ddx:  Drug eruption, SJS, dress syndrome, contact dermatitis, UTI, pyelonephritis  MDM/Course:  Patient presents to the emergency department with a rash.  This is in the setting of completing treatment for UTI with Bactrim  recently.  No history of allergies to Bactrim  or any other medications.  No systemic symptoms.  No desquamation.  She is overall well-appearing on exam with a maculopapular  rash.  Does not involve the palms or soles or oropharynx.  Suspect is likely a drug eruption from the Bactrim .  Did send labs that show her hemoglobin which is at baseline but normal LFTs and renal function.  Low concern for SJS or dress syndrome given these findings.  Will treat her symptoms with antihistamines and topical steroids.  Urinalysis was sent which shows that she likely still has a UTI.  Unfortunately I do not have any cultures for review but did send a culture today.  Prescribed her Keflex  which she reports she has taken without any sort of allergic reaction in the past.  Upon re-evaluation patient remained well-appearing.  Not retaining urine and postvoid residual with 18 mL of urine in her bladder.  Will have her follow-up with her primary doctor in several days.  Of note she was hypertensive but reports her blood pressures have been normal at home and they actually just took her off her blood pressure medication because of good blood pressure so we will have her follow-up with her primary doctor for this.   This patient presents to the ED for concern of complaints listed in HPI, this involves an extensive number of treatment options, and is a complaint that carries with it a high risk of complications and morbidity. Disposition including potential need for admission considered.   Dispo: DC Home. Return precautions discussed including, but not limited to, those listed in the AVS. Allowed pt time to ask questions which were answered fully prior to dc.  Additional history obtained from spouse Records reviewed Outpatient Clinic Notes The following labs were independently interpreted: Chemistry and  show no acute abnormality I have reviewed the patients home medications and made adjustments as needed Social Determinants of health:  Geriatric  Portions of this note were generated with Scientist, clinical (histocompatibility and immunogenetics). Dictation errors may occur despite best attempts at proofreading.     Final  diagnoses:  Drug eruption  Acute cystitis without hematuria    ED Discharge Orders          Ordered    triamcinolone  cream (KENALOG ) 0.1 %  2 times daily        04/12/24 0834    cephALEXin  (KEFLEX ) 500 MG capsule  2 times daily        04/12/24 0918                Yolande Lamar BROCKS, MD 04/14/24 [id number]

## 2024-04-14 LAB — URINE CULTURE: Culture: 40000 — AB

## 2024-04-15 ENCOUNTER — Telehealth (HOSPITAL_BASED_OUTPATIENT_CLINIC_OR_DEPARTMENT_OTHER): Payer: Self-pay | Admitting: *Deleted

## 2024-04-15 NOTE — Progress Notes (Signed)
 ED Antimicrobial Stewardship Positive Culture Follow Up   Terri Alexander is an 73 y.o. female who presented to [facility] with a chief complaint of  Chief Complaint  Patient presents with   Rash    Recent Results (from the past 720 hours)  Surgical pcr screen     Status: None   Collection Time: 03/19/24  9:36 AM   Specimen: Nasal Mucosa; Nasal Swab  Result Value Ref Range Status   MRSA, PCR NEGATIVE NEGATIVE Final   Staphylococcus aureus NEGATIVE NEGATIVE Final    Comment: (NOTE) The Xpert SA Assay (FDA approved for NASAL specimens in patients 27 years of age and older), is one component of a comprehensive surveillance program. It is not intended to diagnose infection nor to guide or monitor treatment. Performed at [facility], [address] [place], [place] [address]   MICROSCOPIC MESSAGE     Status: None   Collection Time: 04/02/24 10:47 AM  Result Value Ref Range Status   Note   Final    Comment: This urine was analyzed for the presence of WBC,  RBC, bacteria, casts, and other formed elements.  Only those elements seen were reported. . .   Urine Culture     Status: Abnormal   Collection Time: 04/12/24  8:50 AM   Specimen: Urine, Clean Catch  Result Value Ref Range Status   Specimen Description   Final    URINE, CLEAN CATCH Performed at [facility], [address], [place], [place] [address]    Special Requests   Final    NONE Performed at [facility], [address], [place], [place] [address]    Culture (A)  Final    40,000 COLONIES/mL ESCHERICHIA COLI Confirmed Extended Spectrum Beta-Lactamase Producer (ESBL).  In bloodstream infections from ESBL organisms, carbapenems are preferred over piperacillin/tazobactam. They are shown to have a lower risk of mortality.    Report Status 04/14/2024 FINAL  Final   Organism ID, Bacteria ESCHERICHIA COLI (A)  Final      Susceptibility   Escherichia coli - MIC*     AMPICILLIN >=32 RESISTANT Resistant     CEFAZOLIN  (URINE) Value in next row Resistant      >=32 RESISTANTThis is a modified FDA-approved test that has been validated and its performance characteristics determined by the reporting laboratory.  This laboratory is certified under the Clinical Laboratory Improvement Amendments CLIA as qualified to perform high complexity clinical laboratory testing.    CEFEPIME Value in next row Intermediate      >=32 RESISTANTThis is a modified FDA-approved test that has been validated and its performance characteristics determined by the reporting laboratory.  This laboratory is certified under the Clinical Laboratory Improvement Amendments CLIA as qualified to perform high complexity clinical laboratory testing.    ERTAPENEM Value in next row Sensitive      >=32 RESISTANTThis is a modified FDA-approved test that has been validated and its performance characteristics determined by the reporting laboratory.  This laboratory is certified under the Clinical Laboratory Improvement Amendments CLIA as qualified to perform high complexity clinical laboratory testing.    CEFTRIAXONE  Value in next row Resistant      >=32 RESISTANTThis is a modified FDA-approved test that has been validated and its performance characteristics determined by the reporting laboratory.  This laboratory is certified under the Clinical Laboratory Improvement Amendments CLIA as qualified to perform high complexity clinical laboratory testing.    CIPROFLOXACIN  Value in next row Resistant      >=  32 RESISTANTThis is a modified FDA-approved test that has been validated and its performance characteristics determined by the reporting laboratory.  This laboratory is certified under the Clinical Laboratory Improvement Amendments CLIA as qualified to perform high complexity clinical laboratory testing.    GENTAMICIN Value in next row Resistant      >=32 RESISTANTThis is a modified FDA-approved test that has been  validated and its performance characteristics determined by the reporting laboratory.  This laboratory is certified under the Clinical Laboratory Improvement Amendments CLIA as qualified to perform high complexity clinical laboratory testing.    NITROFURANTOIN  Value in next row Resistant      >=32 RESISTANTThis is a modified FDA-approved test that has been validated and its performance characteristics determined by the reporting laboratory.  This laboratory is certified under the Clinical Laboratory Improvement Amendments CLIA as qualified to perform high complexity clinical laboratory testing.    TRIMETH /SULFA  Value in next row Resistant      >=32 RESISTANTThis is a modified FDA-approved test that has been validated and its performance characteristics determined by the reporting laboratory.  This laboratory is certified under the Clinical Laboratory Improvement Amendments CLIA as qualified to perform high complexity clinical laboratory testing.    AMPICILLIN/SULBACTAM Value in next row Resistant      >=32 RESISTANTThis is a modified FDA-approved test that has been validated and its performance characteristics determined by the reporting laboratory.  This laboratory is certified under the Clinical Laboratory Improvement Amendments CLIA as qualified to perform high complexity clinical laboratory testing.    PIP/TAZO Value in next row Sensitive      16 SENSITIVEThis is a modified FDA-approved test that has been validated and its performance characteristics determined by the reporting laboratory.  This laboratory is certified under the Clinical Laboratory Improvement Amendments CLIA as qualified to perform high complexity clinical laboratory testing.    MEROPENEM Value in next row Sensitive      16 SENSITIVEThis is a modified FDA-approved test that has been validated and its performance characteristics determined by the reporting laboratory.  This laboratory is certified under the Clinical Laboratory  Improvement Amendments CLIA as qualified to perform high complexity clinical laboratory testing.    * 40,000 COLONIES/mL ESCHERICHIA COLI    Plan: D/c keflex , call for sx check, no tx if asymptomatic, fosfomycin if positive  ED Provider: Nidia Mays PA-C   Dorn Poot 04/15/2024, 9:57 AM Clinical Pharmacist Monday - Friday phone -  [phone] Saturday - Sunday phone - [phone]

## 2024-04-15 NOTE — Telephone Encounter (Signed)
 Post ED Visit - Positive Culture Follow-up: Successful Patient Follow-Up  Culture assessed and recommendations reviewed by:  []  Rankin Dee, Pharm.D. []  Venetia Gully, Pharm.D., BCPS AQ-ID []  Garrel Crews, Pharm.D., BCPS []  Almarie Lunger, Pharm.D., BCPS []  [place], [address].D., BCPS, AAHIVP []  Rosaline Bihari, Pharm.D., BCPS, AAHIVP []  Vernell Meier, PharmD, BCPS []  Latanya Hint, PharmD, BCPS []  Donald Medley, PharmD, BCPS [x]  Dorn Poot, PharmD  Positive urine culture  []  Patient discharged without antimicrobial prescription and treatment is now indicated [x]  Organism is resistant to prescribed ED discharge antimicrobial []  Patient with positive blood cultures  Changes discussed with ED provider: Nidia Mays, PA-C Called pt for symptom check. Pt stated she is still having some pressure when urinating. Advised to D/C Keflex  New antibiotic prescription Fosfomycin 3g x 1 Called to [employer] [facility].  Contacted patient, date 04/15/24, time 1009   Terri Alexander 04/15/2024, 10:10 AM

## 2024-04-16 ENCOUNTER — Encounter: Payer: Self-pay | Admitting: Physical Therapy

## 2024-04-16 ENCOUNTER — Ambulatory Visit: Admitting: Physical Therapy

## 2024-04-16 VITALS — BP 132/77 | HR 98

## 2024-04-16 DIAGNOSIS — R278 Other lack of coordination: Secondary | ICD-10-CM | POA: Insufficient documentation

## 2024-04-16 DIAGNOSIS — M6281 Muscle weakness (generalized): Secondary | ICD-10-CM | POA: Insufficient documentation

## 2024-04-16 DIAGNOSIS — I6310 Cerebral infarction due to embolism of unspecified precerebral artery: Secondary | ICD-10-CM | POA: Insufficient documentation

## 2024-04-16 DIAGNOSIS — R2689 Other abnormalities of gait and mobility: Secondary | ICD-10-CM | POA: Insufficient documentation

## 2024-04-16 NOTE — Therapy (Signed)
 OUTPATIENT PHYSICAL THERAPY NEURO TREATMENT   Patient Name: Terri Alexander MRN: [id number] DOB:08/13/1950, 73 y.o., female Today's Date: 04/16/2024   PCP: Terri Butler DASEN, MD REFERRING PROVIDER: [name], NP  END OF SESSION:  PT End of Session - 04/16/24 1539     Visit Number 2    Number of Visits 17   16 visits plus eval   Date for Recertification  06/15/24    Authorization Type Medicare A&B/[employer]    Progress Note Due on Visit 10   Follow Medicare Guidelines   PT Start Time 1538    PT Stop Time 1621    PT Time Calculation (min) 43 min    Equipment Utilized During Treatment Gait belt    Activity Tolerance Patient tolerated treatment well    Behavior During Therapy WFL for tasks assessed/performed          Past Medical History:  Diagnosis Date   Adenomatous colon polyp    Allergy    Anemia    as a child, no current problem   Arrhythmia    Atrial fibrillation (HCC)    Blood transfusion without reported diagnosis @ 9 months   9 months old   Cataract    bilateral - MD just watching    Dysrhythmia    A. Fib   GERD (gastroesophageal reflux disease)    History of hiatal hernia    Hyperlipidemia    Osteoporosis 04/15/2016   Peripheral vascular disease    Type B Aortic Dissection   Skin cancer 2013   Skin cancer- Nose, face   Stroke [facility])    Past Surgical History:  Procedure Laterality Date   ANGIOPLASTY, ARTERY, SUBCLAVIAN, ENDOVASCULAR, WITH STENT INSERTION Left 03/19/2024   Procedure: LEFT AXILLARY ARTERY STENT INSERTION;  Surgeon: [name], MD;  Location: [facility] OR;  Service: Vascular;  Laterality: Left;   ATRIAL FIBRILLATION ABLATION N/A 02/16/2023   Procedure: ATRIAL FIBRILLATION ABLATION;  Surgeon: [name], MD;  Location: [facility] INVASIVE CV LAB;  Service: Cardiovascular;  Laterality: N/A;   CARDIOVERSION N/A 09/09/2022   Procedure: CARDIOVERSION;  Surgeon: [name], MD;  Location: [facility] INVASIVE CV LAB;  Service: Cardiovascular;   Laterality: N/A;   CESAREAN SECTION  1981   x 1   CESAREAN SECTION     COLONOSCOPY  2016   hx polyp, tics, sigmoid colon mod tortuous Dr [name],    EMBOLECTOMY Left 03/19/2024   Procedure: EMBOLECTOMY OF LEFT BRACHIAL ARTERY;  Surgeon: [name], MD;  Location: [facility] OR;  Service: Vascular;  Laterality: Left;   EYE SURGERY Bilateral    retina repair x 2   FEMORAL ARTERY EXPLORATION Left 03/19/2024   Procedure: EXPOSURE OF BRACHIAL ARTERY WITH STENT INSERTION;  Surgeon: [name], MD;  Location: [facility] OR;  Service: Vascular;  Laterality: Left;   LAPAROTOMY  1979   removal of ovarian cyst   nissan fundiplication  05/17/1997   REPAIR OF COMPLEX TRACTION RETINAL DETACHMENT Bilateral 01/2019   SVT ABLATION N/A 12/09/2023   Procedure: SVT ABLATION;  Surgeon: [name], MD;  Location: [facility] INVASIVE CV LAB;  Service: Cardiovascular;  Laterality: N/A;   THORACIC AORTIC ENDOVASCULAR STENT GRAFT Left 03/19/2024   Procedure: INSERTION, ENDOVASCULAR STENT GRAFT, AORTA, THORACIC; STENTING X2 OF LEFT SUBCLAVIAN ARTERY;  Surgeon: [name], MD;  Location: [facility] OR;  Service: Vascular;  Laterality: Left;   ULTRASOUND GUIDANCE FOR VASCULAR ACCESS Bilateral 03/19/2024   Procedure: ULTRASOUND GUIDANCE, FOR VASCULAR ACCESS OF  BILATERAL FEMORAL AND LEFT RADIAL ARTERY;  Surgeon: [name], MD;  Location: [facility] OR;  Service: Vascular;  Laterality: Bilateral;   Patient Active Problem List   Diagnosis Date Noted   CVA (cerebral vascular accident) (HCC) 03/23/2024   S/P AAA (abdominal aortic aneurysm) repair 03/19/2024   Thoracic aortic aneurysm, without rupture, unspecified 03/19/2024   Persistent atrial fibrillation (HCC) 08/25/2022   Hypercoagulable state due to persistent atrial fibrillation (HCC) 08/25/2022   Atrial fibrillation with RVR (HCC) 08/18/2022   Aortic dissection (HCC) 08/17/2022   Aortic dissection distal to left subclavian (HCC) 08/17/2022   Diverticulosis 04/19/2022    Diverticulosis of sigmoid colon 04/19/2022   Palpitations 06/24/2021   Premature atrial contraction 06/24/2021   PVC (premature ventricular contraction) 06/24/2021   Abnormal EKG 06/24/2021   Dyslipidemia 03/24/2018   Postmenopausal estrogen deficiency 03/24/2018   Squamous cell carcinoma 08/10/2017   HSV-1 (herpes simplex virus 1) infection 03/16/2017   Osteoporosis 04/15/2016   Vulvar atrophy 11/07/2014   Abnormal Pap smear of cervix 11/07/2014    ONSET DATE: 03/28/2024 (date of referral)  REFERRING DIAG: I63.10 (ICD-10-CM) - Cerebrovascular accident (CVA) due to embolism of precerebral artery (HCC)  THERAPY DIAG:  Other abnormalities of gait and mobility  Muscle weakness (generalized)  Other lack of coordination  Cerebrovascular accident (CVA) due to embolism of precerebral artery (HCC)  Rationale for Evaluation and Treatment: Rehabilitation  SUBJECTIVE: Likes to be called Terri Alexander.                                                                                                                                                                                           SUBJECTIVE STATEMENT: Pt arrived ambulating without any AD use.  Pt reports going to ED on Thanksgiving morning; rash and UTI issues (rash d/t medications).  Feeling better now (rash gone).  Picked up new medication today to get rid of UTI.  No recent falls.  Got cold over Thanksgiving so wearing mask today (feeling better now though).  Sees PCP tomorrow.  CT scan to check stents on Wednesday.  Scheduled OT eval. Pt accompanied by: self and significant other (Husband Terri Alexander drove pt).  PERTINENT HISTORY: Multifocal CVA's s/p TEVAR with subclavian branch endoprosthesis 03/19/24 (LUE and B groin incisions) to repair thoracic/abdominal aortic aneurysm (chronic type B aortic dissection with aneurysmal degeneration of the descending aorta).  Case complicated by dissection of the left subclavian artery with need for stenting of  the axillary and brachial arteries to reestablish flow to left upper extremity. Pt with episode of transient aphasia that lasted a couple of minutes 03/20/24; episode of unsteadiness while walking 03/21/24.  03/21/24 imaging: Small acute infarcts: B frontal lobes, L occipital lobe, and L cerebellum.  Inpatient rehab stay 03/23/24-03/30/24.  PMH also includes anemia, a-fib, arrhythmia, h/o hiatal hernia, PVD (type B aortic dissection), skin CA (nose, face).  PAIN:  Are you having pain? Yes: NPRS scale: 0/10 Pain location: B shoulders Pain description: muscle spasms R side Aggravating factors: unknown Relieving factors: heat; salon pass (patches)  PRECAUTIONS: Fall; no lifting anything heavy with L UE (less than gallon of milk) per pt  RED FLAGS: None   WEIGHT BEARING RESTRICTIONS: No  FALLS: Has patient fallen in last 6 months? No  LIVING ENVIRONMENT: Lives with: lives with their family (husband) Lives in: House/apartment (townhouse; 1 level) Stairs: Yes: External: 1 steps; none Has following equipment at home: Single point cane, shower chair, and Grab bars  PLOF: Independent prior to surgery.  CGA/SBA ambulation without AD in IP rehab.  PATIENT GOALS: Get strength back.  Improve balance.  OBJECTIVE:  Note: Objective measures were completed at Evaluation unless otherwise noted.  DIAGNOSTIC FINDINGS:  Per MRI Brain 03/21/24: 1. Small acute infarcts in the bilateral frontal lobes, left occipital lobe, and left cerebellum. Given involvement of multiple vascular territories, consider embolic etiology.  COGNITION: Overall cognitive status: Within functional limits for tasks assessed   SENSATION: WFL (light touch)  COORDINATION: Good B heel to shin coordination in sitting.  Difficulty with rapid alternating toe taps in sitting B.  EDEMA:  General LE swelling (has had since surgery)  MUSCLE TONE: WNL  POSTURE: rounded shoulders and forward head  LOWER EXTREMITY MMT:    MMT  Right Eval Left Eval  Hip flexion 4/5 4/5  Hip extension    Hip abduction    Hip adduction    Hip internal rotation    Hip external rotation    Knee flexion 4/5 4/5  Knee extension 4+/5 4+/5  Ankle dorsiflexion 4+/5 4+/5  Ankle plantarflexion At least 3/5 AROM At least 3/5 AROM  Ankle inversion    Ankle eversion    (Blank rows = not tested)  BED MOBILITY:  Independent  TRANSFERS: Sit to stand: Modified independence  Assistive device utilized: None     Stand to sit: Modified independence  Assistive device utilized: None      GAIT: Findings: Gait Characteristics: step through gait pattern, Distance walked: clinic distances, Assistive device utilized:None, Level of assistance: SBA, and Comments: no loss of balance ambulating without AD use; pt appearing fatigued though after  FUNCTIONAL TESTS:  5 times sit to stand: 12.09 seconds 10 meter walk test: 0.87 m/sec (11.38 seconds); HR 104 bpm post activity SLS: R LE 5.10 seconds; L LE unable to perform without assist for balance (52/56 on BERG 03/29/24 in inpatient rehab) FGA: 18/30 (no AD use) 04/16/24 : 1096 feet (no AD use) 04/16/24  FUNCTIONAL GAIT ASSESSMENT  Date: 04/16/24 Score  GAIT LEVEL SURFACE Instructions: Walk at your normal speed from here to the next mark (6 m) [20 ft]. (2) Mild impairment - Walks 6 m (20 ft) in less than 7 seconds but greater than 5.5 seconds, uses assistive device, slower speed, mild gait deviations, or deviates 15.24 -25.4 cm (6 -10 in) outside of the 30.48-cm (12-in) walkway width. 6.09 seconds  2.   CHANGE IN GAIT SPEED Instructions: Begin walking at your normal pace (for 1.5 m [5 ft]). When I tell you "go," walk as fast as you can (for 1.5 m [5 ft]). When I tell you "slow," walk as slowly as you  can (for 1.5 m [5 ft]. (2) Mild impairment - Is able to change speed but demonstrates mild gait deviations, deviates 15.24 -25.4 cm (6 -10 in) outside of the 30.48-cm (12-in) walkway width, or no  gait deviations but unable to achieve a significant change in velocity, or uses an assistive device  3.    GAIT WITH HORIZONTAL HEAD TURNS Instructions: Walk from here to the next mark 6 m (20 ft) away. Begin walking at your normal pace. Keep walking straight; after 3 steps, turn your head to the right and keep walking straight while looking to the right. After 3 more steps, turn your head to the left and keep walking straight while looking left. Continue alternating looking right and left. (3) Normal - Performs head turns smoothly with no change in gait. Deviates no more than 15.24 cm (6 in) outside 30.48-cm (12-in) walkway width.  4.   GAIT WITH VERTICAL HEAD TURNS Instructions: Walk from here to the next mark (6 m [20 ft]). Begin walking at your normal pace. Keep walking straight; after 3 steps, tip your head up and keep walking straight while looking up. After 3 more steps, tip your head down, keep walking straight while looking down. Continue  alternating looking up and down every 3 steps until you have completed 2 repetitions in each direction. (2) Mild impairment - Performs task with slight change in gait velocity (eg, minor disruption to smooth gait path), deviates 15.24 -25.4 cm (6 -10 in) outside 30.48-cm (12-in) walkway width or uses assistive device.  5.  GAIT AND PIVOT TURN Instructions: Begin with walking at your normal pace. When I tell you, "turn and stop," turn as quickly as you can to face the opposite direction and stop. (3) Normal - Pivot turns safely within 3 seconds and stops quickly with no loss of balance  6.   STEP OVER OBSTACLE Instructions: Begin walking at your normal speed. When you come to the shoe box, step over it, not around it, and keep walking. (2) Mild impairment - Is able to step over one shoe box (11.43 cm [4.5 in] total height) without changing gait speed; no evidence of imbalance.  7.   GAIT WITH NARROW BASE OF SUPPORT Instructions: Walk on the floor with arms folded  across the chest, feet aligned heel to toe in tandem for a distance of 3.6 m [12 ft]. The number of steps taken in a straight line are counted for a maximum of 10 steps. (0) Severe impairment - Ambulates less than 4 steps heel to toe or cannot perform without assistance.  8.   GAIT WITH EYES CLOSED Instructions: Walk at your normal speed from here to the next mark (6 m [20 ft]) with your eyes closed. (0) Severe impairment - Cannot walk 6 m (20 ft) without assistance, severe gait deviations or imbalance, deviates greater than 38.1 cm (15 in) outside 30.48-cm (12-in) walkway width or will not attempt task. 8.28 seconds with significant R veer.  9.   AMBULATING BACKWARDS Instructions: Walk backwards until I tell you to stop (2) Mild impairment - Walks 6 m (20 ft), uses assistive device, slower speed, mild gait deviations, deviates 15.24 -25.4 cm (6 -10 in) outside 30.48-cm (12-in) walkway width. 12.68 seconds  10. STEPS Instructions: Walk up these stairs as you would at home (ie, using the rail if necessary). At the top turn around and walk down. (2) Mild impairment-Alternating feet, must use rail.  Total 18/30   Interpretation of scores: Non-Specific Older  Adults Cutoff Score: <=22/30 = risk of falls Parkinson's Disease Cutoff score <15/30= fall risk (Hoehn & Yahr 1-4)  Minimally Clinically Important Difference (MCID)  Stroke (acute, subacute, and chronic) = MDC: 4.2 points Vestibular (acute) = MDC: 6 points Community Dwelling Older Adults =  MCID: 4 points Parkinson's Disease  =  MDC: 4.3 points  (Academy of Neu[employer]ic Physical Therapy (nd). Functional Gait Assessment. Retrieved from https://www.neuropt.org/docs/default-source/cpgs/core-outcome-measures/function-gait-assessment-pocket-guide-proof9-(2).pdf?sfvrsn=b105f35043_0.)   PATIENT SURVEYS:  TBA                                                                                                                              TREATMENT DATE:  04/16/24  Self Care: BP and HR taken in sitting at rest beginning of session (see below for details). Vitals:   04/16/24 1543  BP: 132/77  Pulse: 98   Therapeutic Activity: FGA: 18/30 (see above for additional details) : 1096 feet (HR 105 bpm post activity) Balance in // bars on 6 foot foam balance beam: Sidestepping (R then L = 1 rep): x4 reps; SBA Standing on Airex in // bars: Toe taps to 6 inch cones: x20 reps x2 trials (alternating with B LE's) 1st trial: 3 loss of balance with R toe taps; pt able to correct balance via // bars 2nd trial: 1 loss of balance with L toe tap (pt utilizing // bar to regain balance) Notes: HR 111 bpm post activity  PATIENT EDUCATION: Education details: FGA and results.  Continue HEP from inpatient rehab. Person educated: Patient and Spouse Education method: Explanation and Verbal cues Education comprehension: verbalized understanding, verbal cues required, and needs further education  HOME EXERCISE PROGRAM: Continue HEP from inpatient rehab per pt tolerance: Access Code: [email or web address] URL: [email or web address]  Exercises - Standing Tandem Balance with Counter Support  - 1 x daily - 7 x weekly - 3 sets - 10 reps - Mini Squat with Counter Support  - 1 x daily - 7 x weekly - 3 sets - 10 reps - Heel Raises with Counter Support  - 1 x daily - 7 x weekly - 3 sets - 10 reps - Side Stepping with Resistance at Ankles and Counter Support  - 1 x daily - 7 x weekly - 3 sets - 10 reps  GOALS: Goals reviewed with patient? Yes  SHORT TERM GOALS: Target date: 05/02/2024  Pt will be independent with initial HEP in order to improve strength and balance in order to decrease fall risk and improve function at home for ADL's. Baseline:  TBA Goal status: INITIAL  2.  Assess FGA and update LTG as needed. Baseline: 18/30 04/16/24 Goal status: MET  3.  Assess and update LTG as needed. Baseline: 1096 feet on 04/16/24 Goal status: MET  4.   Pt will increase by at least 0.13 m/s in order to demonstrate clinically significant improvement in community ambulation.  Baseline: 0.87 m/sec Goal status: INITIAL  LONG TERM GOALS: Target date: 05/30/2024  Pt will be independent with final HEP in order to improve strength and balance in order to decrease fall risk and improve function at home for ADL's. Baseline:  Goal status: INITIAL  2.  Patient will increase Functional Gait Assessment score to > or equal to 22/30 as to reduce fall risk and improve dynamic gait safety with community ambulation. Baseline: 18/30 04/16/24 Goal status: INITIAL  3.  Patient will tolerate 10 seconds of single leg stance B LE's without loss of balance to improve ability to get in and out of shower safely. Baseline: R LE 5.10 seconds; L LE unable to perform without assist for balance Goal status: INITIAL  4.  Pt will increase by at least 55m (179ft) in order to demonstrate clinically significant improvement in cardiopulmonary endurance and community ambulation. Baseline: 1096 feet on 04/16/24 Goal status: INITIAL   ASSESSMENT:  CLINICAL IMPRESSION: Patient was seen today for follow-up physical therapy treatment to address outcome measures and balance.  Focused session on FGA, , and balance on compliant surface.  Pt scored 18/30 on FGA indicating pt is at increased risk of falls (</= 22/30 = risk of falls in elderly).  Pt able to ambulate 1096 feet for (no AD use).  Patient continues to be limited by balance and strength.  They demonstrate improvement in activity tolerance in general compared to initial evaluation.  They would continue to benefit from skilled PT to address impairments as noted and progress towards long term goals.  OBJECTIVE IMPAIRMENTS: Abnormal gait, cardiopulmonary status limiting activity, decreased activity tolerance, decreased balance, decreased coordination, decreased endurance, decreased knowledge of condition,  decreased knowledge of use of DME, decreased mobility, difficulty walking, decreased strength, improper body mechanics, postural dysfunction, and pain.   ACTIVITY LIMITATIONS: carrying, lifting, bending, squatting, stairs, bathing, toileting, dressing, and locomotion level  PARTICIPATION LIMITATIONS: meal prep, cleaning, laundry, driving, shopping, community activity, and yard work  PERSONAL FACTORS: Age, Past/current experiences, Transportation, and 1-2 comorbidities: a-fib and PVD are also affecting patient's functional outcome.   REHAB POTENTIAL: Good  CLINICAL DECISION MAKING: Evolving/moderate complexity  EVALUATION COMPLEXITY: Moderate  PLAN:  PT FREQUENCY: 2x/week  PT DURATION: 8 weeks  PLANNED INTERVENTIONS: [id number]- PT Re-evaluation, 97750- Physical Performance Testing, 97110-Therapeutic exercises, 97530- Therapeutic activity, [id number]- Neuromuscular re-education, 97535- Self Care, [id number]- Manual therapy, [id number]- Gait training, [phone]- Orthotic Initial, [phone]- Orthotic/Prosthetic subsequent, [phone]- Aquatic Therapy, [phone]- Electrical stimulation (manual), [id number]- Ultrasound, [phone] (1-2 muscles), [id number] (3+ muscles)- Dry Needling, Patient/Family education, Balance training, Stair training, Taping, Joint mobilization, Spinal mobilization, Scar mobilization, Compression bandaging, DME instructions, Cryotherapy, Moist heat, and Biofeedback  PLAN FOR NEXT SESSION: Update HEP.  LE strengthening; LE coordination; balance; aerobic endurance; single leg balance.   Damien Caulk, PT 04/16/2024, 7:44 PM

## 2024-04-17 ENCOUNTER — Ambulatory Visit: Admitting: Family Medicine

## 2024-04-17 ENCOUNTER — Encounter: Payer: Self-pay | Admitting: Family Medicine

## 2024-04-17 VITALS — BP 124/72 | HR 82 | Temp 97.6°F | Ht 62.0 in | Wt 137.2 lb

## 2024-04-17 DIAGNOSIS — M25511 Pain in right shoulder: Secondary | ICD-10-CM | POA: Diagnosis not present

## 2024-04-17 DIAGNOSIS — D509 Iron deficiency anemia, unspecified: Secondary | ICD-10-CM

## 2024-04-17 DIAGNOSIS — N3090 Cystitis, unspecified without hematuria: Secondary | ICD-10-CM | POA: Diagnosis not present

## 2024-04-17 DIAGNOSIS — M25512 Pain in left shoulder: Secondary | ICD-10-CM | POA: Diagnosis not present

## 2024-04-17 DIAGNOSIS — I4891 Unspecified atrial fibrillation: Secondary | ICD-10-CM | POA: Diagnosis not present

## 2024-04-17 MED ORDER — FOSFOMYCIN TROMETHAMINE 3 G PO PACK: 3.0000 g | PACK | ORAL | 0 refills | Status: DC | PRN

## 2024-04-17 MED ORDER — GABAPENTIN 100 MG PO CAPS: 100.0000 mg | ORAL_CAPSULE | Freq: Three times a day (TID) | ORAL | 3 refills | Status: AC | PRN

## 2024-04-17 NOTE — Progress Notes (Signed)
 Subjective:    Patient ID: Terri Alexander, female    DOB: 03-Dec-1950, 73 y.o.   MRN: [id number]  Admission Date: 03/19/2024   Discharge Date: 03/23/2024   Physician: [name], MD   Admission Diagnosis: Aneurysm of descending thoracic aorta without rupture [I71.23] S/P AAA (abdominal aortic aneurysm) repair [S01.109, Z86.79] Thoracic aortic aneurysm, without rupture, unspecified [I71.20]   Discharge Day Diagnosis: Aneurysm of descending thoracic aorta without rupture [I71.23] S/P AAA (abdominal aortic aneurysm) repair [S01.109, Z86.79] Thoracic aortic aneurysm, without rupture, unspecified [I71.20]   Hospital Course:  The patient was admitted to the hospital and taken to the operating room on 03/19/2024 and underwent the following surgery: 1) percutaneous access bilateral common femoral arteries, 2) TEVAR with left subclavian sidebranch endoprosthesis, reinforced with VBX, 3) selective catheterization of the left radial artery, 4) left upper extremity angiogram, 5) left brachial artery cutdown with left upper extremity Fogarty embolectomy, 6) primary repair of left brachial artery and 7) left axillary, brachial, and subclavian artery stenting.  She tolerated the procedure well was transferred to the PACU in stable condition.  She was then transferred to the ICU for overnight monitoring.   POD 1 (03/20/2024) - She was doing fairly well with minimal pain.  Left upper extremity and bilateral lower extremities were well-perfused.  She had palpable pedal pulses and brisk left ulnar/radial/palmar arch Doppler signals.  All of her incisions were intact with significant bruising but no hematoma.  She was mildly tachycardic and was given 500 cc saline bolus.  Hemoglobin with slight drop from surgery, at 8.6.  Repeat hemoglobin later that evening was 9.6.  She was cleared to mobilize with PT/OT.   POD 2 (03/21/2024) - She had a dizziness event in the evening of POD 1 and rapid response was  called.  NIH was negative.  Her dizziness improved afterwards.  Again in the morning of POD 2 she had global weakness with standing and some dizziness.  MRI brain was ordered to rule out stroke.  We tried to obtain CTA of the chest, however her IV access was lost. All of her incisions remained well-appearing without evidence of further bleeding.  Her left arm remained well-perfused with brisk ulnar/radial/palmar arch Doppler signals.  Her Pradaxa  was restarted.  She was also restarted on metoprolol  for tachycardia.   POD 3 (03/22/2024) - MRI of the brain resulted with an embolic event with small, punctate infarcts involving bilateral hemispheres.  This could have been due to thrombus formation within the aorta after surgery.  Neurology was consulted.  They recommended no further intervention and continued medical management with daily aspirin  and Pradaxa .  Overall her dizziness and weakness improved.  She continued to have good perfusion to both feet and her left hand.    POD 4 (03/23/2024)- Her mobility was improving with PT/OT and she was recommended to go to CIR at discharge.  She had no further dizziness or weakness events.  She had no other focal neurological deficits.  All of her incisions were well-appearing and intact without further bleeding.  She had palpable pedal pulses bilaterally.  She had brisk left ulnar/radial/palmar arch Doppler signals.  Her tachycardia had improved at rest.  Echo was also performed and was negative for decreased ventricular function or thrombus. CXR was ordered for some increased work of breathing, which demonstrated a stable, small area of atelectasis within the left lung.  Incentive spirometry was encouraged.  She was also to resume her home statin.   Overall she  was tolerating a normal diet without swallowing difficulties.  She was also mobilized PT/OT.  She was also voiding without difficulty.  She was discharged to CIR on 03/23/2024.  04/02/24 Terri Alexander was recently  admitted to repair thoracic aortic aneurysm.  Postoperative course was complicated by small embolic punctate infarcts of the brain.  She is currently on aspirin  and Pradaxa .  Aspirin  for the stents placed by vascular surgery and Pradaxa  to prevent embolic infarcts from atrial fibrillation.  Unfortunately, her postoperative hemoglobin dropped from 13-8.  It was 8.7 at the time of discharge.  Today she reports mild tachycardia to 106 with activity, fatigue, and shortness of breath with activity.  She denies any melena or hematochezia.  She is not taking any iron.  She does report dysuria urgency and frequency.  She denies any cough.  She does report stomach upset.  She has a history of a Niesen fundoplication.  She states that she is having dysphagia with food sticking in the distal esophagus near her stomach.  She also reports some burning pain in that area.  Of note she is on Fosamax .  At that time, my plan was:  Biggest issue appears to be postoperative anemia.  I will check a CBC today.  If hemoglobin is 8.5 or higher, I feel that this rules out ongoing blood loss.  I would recommend ferrous sulfate 325 mg daily until hemoglobin improves.  If hemoglobin is dropping, I would be concerned about possible upper GI bleed due to the combination of aspirin  as well as Pradaxa .  Temporarily continue to hold metoprolol  due to soft blood pressure.  The patient is in normal sinus rhythm and does not need rate control at the present time.  I do not want to drop her blood pressure further until I am sure that she is not losing blood.  There is no indication today on her exam for Lasix .  Discontinue Fosamax  temporarily given the dysphagia.  Increase Prilosec  to 40 mg a day.  If not improving, we will need to consider an EGD however I do not want to stop her blood thinners given her recent stroke unless there is concern over bleeding  04/17/24 Unfortunately, the patient developed an allergic reaction to Bactrim  and had to go  to the emergency room on November 27.  In the emergency room a urine culture showed persistent urinary tract infection and she was given Keflex  however the culture eventually grew ESBL E. coli that was pan resistant.  Therefore they called out 1 dose of fosfomycin that the patient had yesterday.  She states that she is feeling better.  She denies any dysuria.  She states that the pelvic pressure has improved.  She denies any fevers or chills.  She is in normal sinus rhythm today.  Her heart rate and blood pressure are controlled.  She denies any palpitations or shortness of breath or chest pain.  Of note she does complain of bilateral shoulder heaviness and achiness.  However she has full range of motion without pain in her shoulders.  The pain seems to radiate into the shoulders from her neck.  She states at times the pain is severe.  Tylenol  is minimally helpful.   Past Medical History:  Diagnosis Date   Adenomatous colon polyp    Allergy    Anemia    as a child, no current problem   Arrhythmia    Atrial fibrillation (HCC)    Blood transfusion without reported diagnosis @ 9 months  9 months old   Cataract    bilateral - MD just watching    Dysrhythmia    A. Fib   GERD (gastroesophageal reflux disease)    History of hiatal hernia    Hyperlipidemia    Osteoporosis 04/15/2016   Peripheral vascular disease    Type B Aortic Dissection   Skin cancer 2013   Skin cancer- Nose, face   Stroke [facility])    Current Outpatient Medications on File Prior to Visit  Medication Sig Dispense Refill   acetaminophen  (TYLENOL ) 325 MG tablet Take 1-2 tablets (325-650 mg total) by mouth every 4 (four) hours as needed for mild pain (pain score 1-3).     alendronate  (FOSAMAX ) 70 MG tablet TAKE 1 TABLET BY MOUTH ONCE A WEEK ON AN EMPTY STOMACH WITH A FULL GLASS OF WATER 12 tablet 3   Ascorbic Acid (VITAMIN C) 1000 MG tablet Take 1,000 mg by mouth in the morning.     aspirin  EC 81 MG tablet Take 1 tablet (81 mg  total) by mouth daily at 6 (six) AM. Swallow whole. 30 tablet 12   Biotin 1000 MCG tablet Take 1,000 mcg by mouth daily.     Calcium Carbonate-Vit D-Min (CALCIUM 1200 PO) Take 1 tablet by mouth in the morning.     Calcium Polycarbophil (FIBER-CAPS PO) Take 1 capsule by mouth in the morning.     cephALEXin  (KEFLEX ) 500 MG capsule Take 1 capsule (500 mg total) by mouth 2 (two) times daily for 7 days. 14 capsule 0   cetirizine (ZYRTEC) 10 MG tablet Take 10 mg by mouth daily.     Cholecalciferol (VITAMIN D3) 125 MCG (5000 UT) TABS Take 5,000 Units by mouth in the morning.     Cranberry 500 MG CHEW Chew 500 mg by mouth in the morning.     cyclobenzaprine  (FLEXERIL ) 10 MG tablet Take 1 tablet (10 mg total) by mouth 3 (three) times daily as needed for muscle spasms. 30 tablet 1   dabigatran  (PRADAXA ) 150 MG CAPS capsule Take 1 capsule (150 mg total) by mouth 2 (two) times daily. 180 capsule 3   docusate sodium  (COLACE) 100 MG capsule Take 100 mg by mouth in the morning.     famotidine  (PEPCID ) 10 MG tablet Take 1 tablet (10 mg total) by mouth 2 (two) times daily. 60 tablet 0   furosemide  (LASIX ) 40 MG tablet Take 1 tablet (40 mg total) by mouth as needed for edema. 30 tablet 0   L-Lysine 500 MG CAPS Take 500 mg by mouth in the morning.     Lidocaine  (SALONPAS PAIN RELIEVING EX) Apply 1 Application topically daily as needed (pain).     Menthol-Methyl Salicylate (SALONPAS PAIN RELIEF PATCH EX) Apply 1 patch topically daily as needed (pain).     [Paused] metoprolol  succinate (TOPROL  XL) 25 MG 24 hr tablet Take 0.5 tablets (12.5 mg total) by mouth daily. 45 tablet 3   Multiple Vitamin (MULTIVITAMIN WITH MINERALS) TABS tablet Take 1 tablet by mouth in the morning.     omeprazole  (PRILOSEC  OTC) 20 MG tablet Take 20 mg by mouth daily as needed (acid reflux).     oxyCODONE  (OXY IR/ROXICODONE ) 5 MG immediate release tablet Take 1 tablet (5 mg total) by mouth every 4 (four) hours as needed for moderate pain (pain  score 4-6). 28 tablet 0   polyethylene glycol (MIRALAX  / GLYCOLAX ) 17 g packet Take 17 g by mouth daily.     Probiotic Product (PROBIOTIC PO) Take  1 tablet by mouth in the morning.     senna-docusate (SENOKOT-S) 8.6-50 MG tablet Take 1 tablet by mouth 2 (two) times daily. 60 tablet 0   simethicone  (MYLICON) 80 MG chewable tablet Chew 1 tablet (80 mg total) by mouth 4 (four) times daily as needed for flatulence.     simvastatin  (ZOCOR ) 10 MG tablet Take 1 tablet (10 mg total) by mouth at bedtime. 90 tablet 3   sulfamethoxazole -trimethoprim  (BACTRIM  DS) 800-160 MG tablet Take 1 tablet by mouth 2 (two) times daily. 14 tablet 0   triamcinolone  cream (KENALOG ) 0.1 % Apply 1 Application topically 2 (two) times daily. 30 g 0   No current facility-administered medications on file prior to visit.   No Known Allergies Social History   Socioeconomic History   Marital status: Married    Spouse name: Not on file   Number of children: Not on file   Years of education: Not on file   Highest education level: 12th grade  Occupational History   Not on file  Tobacco Use   Smoking status: Never   Smokeless tobacco: Never   Tobacco comments:    Never smoked 12/16/23  Vaping Use   Vaping status: Never Used  Substance and Sexual Activity   Alcohol use: Yes    Alcohol/week: 1.0 standard drink of alcohol    Types: 1 Glasses of wine per week    Comment: rarely   Drug use: No   Sexual activity: Not Currently    Birth control/protection: Post-menopausal  Other Topics Concern   Not on file  Social History Narrative   Not on file   Social Drivers of Health   Financial Resource Strain: Low Risk  (11/14/2023)   Overall Financial Resource Strain (CARDIA)    Difficulty of Paying Living Expenses: Not hard at all  Food Insecurity: Unknown (04/13/2024)   Hunger Vital Sign    Worried About Running Out of Food in the Last Year: Never true    Ran Out of Food in the Last Year: Not on file  Transportation  Needs: No Transportation Needs (04/13/2024)   PRAPARE - Administrator, Civil Service (Medical): No    Lack of Transportation (Non-Medical): No  Physical Activity: Insufficiently Active (04/13/2024)   Exercise Vital Sign    Days of Exercise per Week: 3 days    Minutes of Exercise per Session: 20 min  Stress: No Stress Concern Present (04/13/2024)   [employer] of Occupational Health - Occupational Stress Questionnaire    Feeling of Stress: Only a little  Social Connections: Unknown (04/13/2024)   Social Connection and Isolation Panel    Frequency of Communication with Friends and Family: More than three times a week    Frequency of Social Gatherings with Friends and Family: Three times a week    Attends Religious Services: 1 to 4 times per year    Active Member of Clubs or Organizations: Not on file    Attends Banker Meetings: Not on file    Marital Status: Married  Intimate Partner Violence: Not At Risk (03/19/2024)   Humiliation, Afraid, Rape, and Kick questionnaire    Fear of Current or Ex-Partner: No    Emotionally Abused: No    Physically Abused: No    Sexually Abused: No      Review of Systems  All other systems reviewed and are negative.      Objective:   Physical Exam Vitals reviewed.  Constitutional:  General: She is not in acute distress.    Appearance: Normal appearance. She is not ill-appearing, toxic-appearing or diaphoretic.  HENT:     Head: Normocephalic and atraumatic.     Nose: Nose normal.     Mouth/Throat:     Mouth: Mucous membranes are moist.     Pharynx: Oropharynx is clear. No oropharyngeal exudate or posterior oropharyngeal erythema.  Eyes:     Extraocular Movements: Extraocular movements intact.     Conjunctiva/sclera: Conjunctivae normal.     Pupils: Pupils are equal, round, and reactive to light.  Neck:     Vascular: No carotid bruit.  Cardiovascular:     Rate and Rhythm: Normal rate and regular rhythm.      Pulses: Normal pulses.     Heart sounds: Normal heart sounds. No murmur heard.    No friction rub. No gallop.  Pulmonary:     Effort: Pulmonary effort is normal. No respiratory distress.     Breath sounds: No stridor. No wheezing, rhonchi or rales.  Chest:     Chest wall: No tenderness.  Abdominal:     General: Abdomen is flat. Bowel sounds are normal. There is no distension.     Palpations: Abdomen is soft. There is no mass.     Tenderness: There is no abdominal tenderness. There is no guarding or rebound.     Hernia: No hernia is present.  Musculoskeletal:     Cervical back: Normal range of motion and neck supple. No tenderness.     Right lower leg: No edema.     Left lower leg: No edema.  Lymphadenopathy:     Cervical: No cervical adenopathy.  Skin:    General: Skin is warm.     Coloration: Skin is not jaundiced or pale.     Findings: No bruising, erythema, lesion or rash.  Neurological:     General: No focal deficit present.     Mental Status: She is alert and oriented to person, place, and time. Mental status is at baseline.     Cranial Nerves: No cranial nerve deficit.     Sensory: No sensory deficit.     Motor: No weakness.     Coordination: Coordination normal.     Gait: Gait normal.     Deep Tendon Reflexes: Reflexes normal.  Psychiatric:        Mood and Affect: Mood normal.        Behavior: Behavior normal.        Thought Content: Thought content normal.        Judgment: Judgment normal.           Assessment & Plan:  Atrial fibrillation, unspecified type (HCC)  Cystitis  Acute pain of both shoulders  Iron deficiency anemia, unspecified iron deficiency anemia type Patient is in normal sinus rhythm today.  Her heart rate and blood pressure are controlled.  Together we have decided not to resume the metoprolol  at the present time.  Will plan to recheck this in 1 month.  Second the patient has cystitis due to ESBL E. coli.  Given the multidrug-resistant  nature of this infection I would like to extend the fosfomycin for 2 additional doses.  She will take 1 dose every 3 days until she has completed 3 doses as recommended.  If urinary tract symptoms worsen she will need to go to the hospital for IV antibiotics.  Third I feel the pain in both shoulders is likely cervical radiculopathy.  She is unable  to take NSAIDs and Tylenol  is not beneficial nor have the muscle relaxers helped.  Begin gabapentin 100 mg p.o. every 8 hours as needed nerve pain to see if beneficial.  Fourth, we will recheck her CBC to monitor her hemoglobin and iron levels in 1 month

## 2024-04-18 ENCOUNTER — Ambulatory Visit (HOSPITAL_COMMUNITY)
Admission: RE | Admit: 2024-04-18 | Discharge: 2024-04-18 | Disposition: A | Source: Ambulatory Visit | Attending: Vascular Surgery

## 2024-04-18 DIAGNOSIS — I7123 Aneurysm of the descending thoracic aorta, without rupture: Secondary | ICD-10-CM | POA: Diagnosis not present

## 2024-04-18 DIAGNOSIS — I7774 Dissection of vertebral artery: Secondary | ICD-10-CM | POA: Diagnosis not present

## 2024-04-18 DIAGNOSIS — I7772 Dissection of iliac artery: Secondary | ICD-10-CM | POA: Diagnosis not present

## 2024-04-18 MED ORDER — IOHEXOL 350 MG/ML SOLN
100.0000 mL | Freq: Once | INTRAVENOUS | Status: AC | PRN
  Administered 2024-04-18: 100 mL via INTRAVENOUS

## 2024-04-20 ENCOUNTER — Encounter: Payer: Self-pay | Admitting: Physical Therapy

## 2024-04-20 ENCOUNTER — Ambulatory Visit: Admitting: Physical Therapy

## 2024-04-20 DIAGNOSIS — I6310 Cerebral infarction due to embolism of unspecified precerebral artery: Secondary | ICD-10-CM

## 2024-04-20 DIAGNOSIS — R2689 Other abnormalities of gait and mobility: Secondary | ICD-10-CM

## 2024-04-20 DIAGNOSIS — M6281 Muscle weakness (generalized): Secondary | ICD-10-CM

## 2024-04-20 DIAGNOSIS — R278 Other lack of coordination: Secondary | ICD-10-CM

## 2024-04-20 NOTE — Therapy (Signed)
 OUTPATIENT PHYSICAL THERAPY NEURO TREATMENT--ARRIVED NO CHARGE   Patient Name: Terri Alexander MRN: [id number] DOB:Jul 12, 1950, 73 y.o., female Today's Date: 04/20/2024  PCP: Terri Butler DASEN, MD REFERRING PROVIDER: [name], NP  END OF SESSION:  PT End of Session - 04/20/24 1450     Visit Number 2   ARRIVED NO CHARGE   Number of Visits 17   16 visits plus eval   Date for Recertification  06/15/24    Authorization Type Medicare A&B/[employer]    Progress Note Due on Visit 10   Follow Medicare Guidelines   PT Start Time 1448    PT Stop Time 1505    PT Time Calculation (min) 17 min    Equipment Utilized During Treatment --    Activity Tolerance Patient tolerated treatment well   Visit limited d/t imaging concerns   Behavior During Therapy WFL for tasks assessed/performed          Past Medical History:  Diagnosis Date   Adenomatous colon polyp    Allergy    Anemia    as a child, no current problem   Arrhythmia    Atrial fibrillation (HCC)    Blood transfusion without reported diagnosis @ 9 months   9 months old   Cataract    bilateral - MD just watching    Dysrhythmia    A. Fib   GERD (gastroesophageal reflux disease)    History of hiatal hernia    Hyperlipidemia    Osteoporosis 04/15/2016   Peripheral vascular disease    Type B Aortic Dissection   Skin cancer 2013   Skin cancer- Nose, face   Stroke [facility])    Past Surgical History:  Procedure Laterality Date   ANGIOPLASTY, ARTERY, SUBCLAVIAN, ENDOVASCULAR, WITH STENT INSERTION Left 03/19/2024   Procedure: LEFT AXILLARY ARTERY STENT INSERTION;  Surgeon: Terri Fonda BRAVO, MD;  Location: [facility] OR;  Service: Vascular;  Laterality: Left;   ATRIAL FIBRILLATION ABLATION N/A 02/16/2023   Procedure: ATRIAL FIBRILLATION ABLATION;  Surgeon: Terri Eulas BRAVO, MD;  Location: [facility] INVASIVE CV LAB;  Service: Cardiovascular;  Laterality: N/A;   CARDIOVERSION N/A 09/09/2022   Procedure: CARDIOVERSION;  Surgeon: Terri Ronal BRAVO,  MD;  Location: [facility] INVASIVE CV LAB;  Service: Cardiovascular;  Laterality: N/A;   CESAREAN SECTION  1981   x 1   CESAREAN SECTION     COLONOSCOPY  2016   hx polyp, tics, sigmoid colon mod tortuous Dr [name],    EMBOLECTOMY Left 03/19/2024   Procedure: EMBOLECTOMY OF LEFT BRACHIAL ARTERY;  Surgeon: Terri Fonda BRAVO, MD;  Location: [facility] OR;  Service: Vascular;  Laterality: Left;   EYE SURGERY Bilateral    retina repair x 2   FEMORAL ARTERY EXPLORATION Left 03/19/2024   Procedure: EXPOSURE OF BRACHIAL ARTERY WITH STENT INSERTION;  Surgeon: Terri Fonda BRAVO, MD;  Location: [facility] OR;  Service: Vascular;  Laterality: Left;   LAPAROTOMY  1979   removal of ovarian cyst   nissan fundiplication  05/17/1997   REPAIR OF COMPLEX TRACTION RETINAL DETACHMENT Bilateral 01/2019   SVT ABLATION N/A 12/09/2023   Procedure: SVT ABLATION;  Surgeon: Terri Eulas BRAVO, MD;  Location: [facility] INVASIVE CV LAB;  Service: Cardiovascular;  Laterality: N/A;   THORACIC AORTIC ENDOVASCULAR STENT GRAFT Left 03/19/2024   Procedure: INSERTION, ENDOVASCULAR STENT GRAFT, AORTA, THORACIC; STENTING X2 OF LEFT SUBCLAVIAN ARTERY;  Surgeon: Terri Fonda BRAVO, MD;  Location: [facility] OR;  Service: Vascular;  Laterality: Left;   ULTRASOUND GUIDANCE FOR VASCULAR ACCESS Bilateral  03/19/2024   Procedure: ULTRASOUND GUIDANCE, FOR VASCULAR ACCESS OF BILATERAL FEMORAL AND LEFT RADIAL ARTERY;  Surgeon: Terri Fonda BRAVO, MD;  Location: [facility] OR;  Service: Vascular;  Laterality: Bilateral;   Patient Active Problem List   Diagnosis Date Noted   CVA (cerebral vascular accident) (HCC) 03/23/2024   S/P AAA (abdominal aortic aneurysm) repair 03/19/2024   Thoracic aortic aneurysm, without rupture, unspecified 03/19/2024   Persistent atrial fibrillation (HCC) 08/25/2022   Hypercoagulable state due to persistent atrial fibrillation (HCC) 08/25/2022   Atrial fibrillation with RVR (HCC) 08/18/2022   Aortic dissection (HCC) 08/17/2022   Aortic dissection distal to left  subclavian (HCC) 08/17/2022   Diverticulosis 04/19/2022   Diverticulosis of sigmoid colon 04/19/2022   Palpitations 06/24/2021   Premature atrial contraction 06/24/2021   PVC (premature ventricular contraction) 06/24/2021   Abnormal EKG 06/24/2021   Dyslipidemia 03/24/2018   Postmenopausal estrogen deficiency 03/24/2018   Squamous cell carcinoma 08/10/2017   HSV-1 (herpes simplex virus 1) infection 03/16/2017   Osteoporosis 04/15/2016   Vulvar atrophy 11/07/2014   Abnormal Pap smear of cervix 11/07/2014    ONSET DATE: 03/28/2024 (date of referral)  REFERRING DIAG: I63.10 (ICD-10-CM) - Cerebrovascular accident (CVA) due to embolism of precerebral artery (HCC)  THERAPY DIAG:  Other abnormalities of gait and mobility  Muscle weakness (generalized)  Other lack of coordination  Cerebrovascular accident (CVA) due to embolism of precerebral artery (HCC)  Rationale for Evaluation and Treatment: Rehabilitation  SUBJECTIVE: Likes to be called [name].                                                                                                                                                                                           SUBJECTIVE STATEMENT: Pt reports going to [facility] 04/17/24 and started on medication for bacterial infection (d/t not being a viral infection).  Started on gabapentin  for shoulder pain/discomfort.  Had CT scan on Wednesday (for aorta).  Sees Dr. Silver next week Thursday (for stents and for follow-up to imaging).  Pt arrived ambulating without any AD use. Pt accompanied by: self and significant other (Husband [name] drove pt).  PERTINENT HISTORY: Multifocal CVA's s/p TEVAR with subclavian branch endoprosthesis 03/19/24 (LUE and B groin incisions) to repair thoracic/abdominal aortic aneurysm (chronic type B aortic dissection with aneurysmal degeneration of the descending aorta).  Case complicated by dissection of the left subclavian artery with need for  stenting of the axillary and brachial arteries to reestablish flow to left upper extremity. Pt with episode of transient aphasia that lasted a couple of minutes 03/20/24; episode of unsteadiness while walking 03/21/24.  03/21/24 imaging: Small acute infarcts: B  frontal lobes, L occipital lobe, and L cerebellum.  Inpatient rehab stay 03/23/24-03/30/24.  PMH also includes anemia, a-fib, arrhythmia, h/o hiatal hernia, PVD (type B aortic dissection), skin CA (nose, face).  PAIN:  Are you having pain? Yes: NPRS scale: 0/10 Pain location: B shoulders Pain description: muscle spasms R side Aggravating factors: unknown Relieving factors: heat; salon pass (patches)  PRECAUTIONS: Fall; no lifting anything heavy with L UE (less than gallon of milk) per pt  RED FLAGS: None   WEIGHT BEARING RESTRICTIONS: No  FALLS: Has patient fallen in last 6 months? No  LIVING ENVIRONMENT: Lives with: lives with their family (husband) Lives in: House/apartment (townhouse; 1 level) Stairs: Yes: External: 1 steps; none Has following equipment at home: Single point cane, shower chair, and Grab bars  PLOF: Independent prior to surgery.  CGA/SBA ambulation without AD in IP rehab.  PATIENT GOALS: Get strength back.  Improve balance.  OBJECTIVE:  Note: Objective measures were completed at Evaluation unless otherwise noted.  DIAGNOSTIC FINDINGS:  Per MRI Brain 03/21/24: 1. Small acute infarcts in the bilateral frontal lobes, left occipital lobe, and left cerebellum. Given involvement of multiple vascular territories, consider embolic etiology.  COGNITION: Overall cognitive status: Within functional limits for tasks assessed   SENSATION: WFL (light touch)  COORDINATION: Good B heel to shin coordination in sitting.  Difficulty with rapid alternating toe taps in sitting B.  EDEMA:  General LE swelling (has had since surgery)  MUSCLE TONE: WNL  POSTURE: rounded shoulders and forward head  LOWER EXTREMITY  MMT:    MMT Right Eval Left Eval  Hip flexion 4/5 4/5  Hip extension    Hip abduction    Hip adduction    Hip internal rotation    Hip external rotation    Knee flexion 4/5 4/5  Knee extension 4+/5 4+/5  Ankle dorsiflexion 4+/5 4+/5  Ankle plantarflexion At least 3/5 AROM At least 3/5 AROM  Ankle inversion    Ankle eversion    (Blank rows = not tested)  BED MOBILITY:  Independent  TRANSFERS: Sit to stand: Modified independence  Assistive device utilized: None     Stand to sit: Modified independence  Assistive device utilized: None      GAIT: Findings: Gait Characteristics: step through gait pattern, Distance walked: clinic distances, Assistive device utilized:None, Level of assistance: SBA, and Comments: no loss of balance ambulating without AD use; pt appearing fatigued though after  FUNCTIONAL TESTS:  5 times sit to stand: 12.09 seconds 10 meter walk test: 0.87 m/sec (11.38 seconds); HR 104 bpm post activity SLS: R LE 5.10 seconds; L LE unable to perform without assist for balance (52/56 on BERG 03/29/24 in inpatient rehab) FGA: 18/30 (no AD use) 04/16/24 : 1096 feet (no AD use) 04/16/24  FUNCTIONAL GAIT ASSESSMENT  Date: 04/16/24 Score  GAIT LEVEL SURFACE Instructions: Walk at your normal speed from here to the next mark (6 m) [20 ft]. (2) Mild impairment - Walks 6 m (20 ft) in less than 7 seconds but greater than 5.5 seconds, uses assistive device, slower speed, mild gait deviations, or deviates 15.24 -25.4 cm (6 -10 in) outside of the 30.48-cm (12-in) walkway width. 6.09 seconds  2.   CHANGE IN GAIT SPEED Instructions: Begin walking at your normal pace (for 1.5 m [5 ft]). When I tell you "go," walk as fast as you can (for 1.5 m [5 ft]). When I tell you "slow," walk as slowly as you can (for 1.5 m [5 ft]. (  2) Mild impairment - Is able to change speed but demonstrates mild gait deviations, deviates 15.24 -25.4 cm (6 -10 in) outside of the 30.48-cm (12-in) walkway  width, or no gait deviations but unable to achieve a significant change in velocity, or uses an assistive device  3.    GAIT WITH HORIZONTAL HEAD TURNS Instructions: Walk from here to the next mark 6 m (20 ft) away. Begin walking at your normal pace. Keep walking straight; after 3 steps, turn your head to the right and keep walking straight while looking to the right. After 3 more steps, turn your head to the left and keep walking straight while looking left. Continue alternating looking right and left. (3) Normal - Performs head turns smoothly with no change in gait. Deviates no more than 15.24 cm (6 in) outside 30.48-cm (12-in) walkway width.  4.   GAIT WITH VERTICAL HEAD TURNS Instructions: Walk from here to the next mark (6 m [20 ft]). Begin walking at your normal pace. Keep walking straight; after 3 steps, tip your head up and keep walking straight while looking up. After 3 more steps, tip your head down, keep walking straight while looking down. Continue  alternating looking up and down every 3 steps until you have completed 2 repetitions in each direction. (2) Mild impairment - Performs task with slight change in gait velocity (eg, minor disruption to smooth gait path), deviates 15.24 -25.4 cm (6 -10 in) outside 30.48-cm (12-in) walkway width or uses assistive device.  5.  GAIT AND PIVOT TURN Instructions: Begin with walking at your normal pace. When I tell you, "turn and stop," turn as quickly as you can to face the opposite direction and stop. (3) Normal - Pivot turns safely within 3 seconds and stops quickly with no loss of balance  6.   STEP OVER OBSTACLE Instructions: Begin walking at your normal speed. When you come to the shoe box, step over it, not around it, and keep walking. (2) Mild impairment - Is able to step over one shoe box (11.43 cm [4.5 in] total height) without changing gait speed; no evidence of imbalance.  7.   GAIT WITH NARROW BASE OF SUPPORT Instructions: Walk on the floor  with arms folded across the chest, feet aligned heel to toe in tandem for a distance of 3.6 m [12 ft]. The number of steps taken in a straight line are counted for a maximum of 10 steps. (0) Severe impairment - Ambulates less than 4 steps heel to toe or cannot perform without assistance.  8.   GAIT WITH EYES CLOSED Instructions: Walk at your normal speed from here to the next mark (6 m [20 ft]) with your eyes closed. (0) Severe impairment - Cannot walk 6 m (20 ft) without assistance, severe gait deviations or imbalance, deviates greater than 38.1 cm (15 in) outside 30.48-cm (12-in) walkway width or will not attempt task. 8.28 seconds with significant R veer.  9.   AMBULATING BACKWARDS Instructions: Walk backwards until I tell you to stop (2) Mild impairment - Walks 6 m (20 ft), uses assistive device, slower speed, mild gait deviations, deviates 15.24 -25.4 cm (6 -10 in) outside 30.48-cm (12-in) walkway width. 12.68 seconds  10. STEPS Instructions: Walk up these stairs as you would at home (ie, using the rail if necessary). At the top turn around and walk down. (2) Mild impairment-Alternating feet, must use rail.  Total 18/30   Interpretation of scores: Non-Specific Older Adults Cutoff Score: <=22/30 = risk  of falls Parkinson's Disease Cutoff score <15/30= fall risk (Hoehn & Yahr 1-4)  Minimally Clinically Important Difference (MCID)  Stroke (acute, subacute, and chronic) = MDC: 4.2 points Vestibular (acute) = MDC: 6 points Community Dwelling Older Adults =  MCID: 4 points Parkinson's Disease  =  MDC: 4.3 points  (Academy of Neurologic Physical Therapy (nd). Functional Gait Assessment. Retrieved from https://www.neuropt.org/docs/default-source/cpgs/core-outcome-measures/function-gait-assessment-pocket-guide-proof9-(2).pdf?sfvrsn=b60f35043_0.)   PATIENT SURVEYS:  TBA                                                                                                                               TREATMENT DATE: 04/20/24  Arrived: no charge visit  PATIENT EDUCATION: Education details: Pt verbalizing some concerns regarding imaging results (pt read results in chart) but pt reports not being contacted yet regarding results so unsure if she should be concerned.  Discussed getting clearance from MD d/t concerns prior to continuing any therapy; pt in agreement. Person educated: Patient and Spouse Education method: Explanation and Verbal cues Education comprehension: verbalized understanding, verbal cues required, and needs further education  HOME EXERCISE PROGRAM: Continue HEP from inpatient rehab per pt tolerance: Access Code: [email or web address] URL: [email or web address]  Exercises - Standing Tandem Balance with Counter Support  - 1 x daily - 7 x weekly - 3 sets - 10 reps - Mini Squat with Counter Support  - 1 x daily - 7 x weekly - 3 sets - 10 reps - Heel Raises with Counter Support  - 1 x daily - 7 x weekly - 3 sets - 10 reps - Side Stepping with Resistance at Ankles and Counter Support  - 1 x daily - 7 x weekly - 3 sets - 10 reps  GOALS: Goals reviewed with patient? Yes  SHORT TERM GOALS: Target date: 05/02/2024  Pt will be independent with initial HEP in order to improve strength and balance in order to decrease fall risk and improve function at home for ADL's. Baseline:  TBA Goal status: INITIAL  2.  Assess FGA and update LTG as needed. Baseline: 18/30 04/16/24 Goal status: MET  3.  Assess and update LTG as needed. Baseline: 1096 feet on 04/16/24 Goal status: MET  4.  Pt will increase by at least 0.13 m/s in order to demonstrate clinically significant improvement in community ambulation.  Baseline: 0.87 m/sec Goal status: INITIAL  LONG TERM GOALS: Target date: 05/30/2024  Pt will be independent with final HEP in order to improve strength and balance in order to decrease fall risk and improve function at home for ADL's. Baseline:  Goal status:  INITIAL  2.  Patient will increase Functional Gait Assessment score to > or equal to 22/30 as to reduce fall risk and improve dynamic gait safety with community ambulation. Baseline: 18/30 04/16/24 Goal status: INITIAL  3.  Patient will tolerate 10 seconds of single leg stance B LE's without loss of balance to improve ability to get in and out of shower safely.  Baseline: R LE 5.10 seconds; L LE unable to perform without assist for balance Goal status: INITIAL  4.  Pt will increase by at least 10m (168ft) in order to demonstrate clinically significant improvement in cardiopulmonary endurance and community ambulation. Baseline: 1096 feet on 04/16/24 Goal status: INITIAL   ASSESSMENT:  CLINICAL IMPRESSION: Pt arrived to session and reporting recent imaging (CT ANGIO CHEST/ABD/PEL FOR DISSECTION performed 04/18/24).  Pt verbalizing some concerns regarding imaging results (pt read results in chart) but pt reports not being contacted yet regarding results so unsure if she should have concerns.  Pt reports imaging ordered by MD Terri and pt has follow-up appointment with this MD on Thursday Dec 11th.   Discussed getting clearance from MD d/t concerns prior to continuing any therapy; pt in agreement.  Arrived no charge visit.  OBJECTIVE IMPAIRMENTS: Abnormal gait, cardiopulmonary status limiting activity, decreased activity tolerance, decreased balance, decreased coordination, decreased endurance, decreased knowledge of condition, decreased knowledge of use of DME, decreased mobility, difficulty walking, decreased strength, improper body mechanics, postural dysfunction, and pain.   ACTIVITY LIMITATIONS: carrying, lifting, bending, squatting, stairs, bathing, toileting, dressing, and locomotion level  PARTICIPATION LIMITATIONS: meal prep, cleaning, laundry, driving, shopping, community activity, and yard work  PERSONAL FACTORS: Age, Past/current experiences, Transportation, and 1-2 comorbidities:  a-fib and PVD are also affecting patient's functional outcome.   REHAB POTENTIAL: Good  CLINICAL DECISION MAKING: Evolving/moderate complexity  EVALUATION COMPLEXITY: Moderate  PLAN:  PT FREQUENCY: 2x/week  PT DURATION: 8 weeks  PLANNED INTERVENTIONS: 97164- PT Re-evaluation, 97750- Physical Performance Testing, 97110-Therapeutic exercises, 97530- Therapeutic activity, [id number]- Neuromuscular re-education, 97535- Self Care, [id number]- Manual therapy, [id number]- Gait training, [phone]- Orthotic Initial, [phone]- Orthotic/Prosthetic subsequent, [phone]- Aquatic Therapy, [phone]- Electrical stimulation (manual), [id number]- Ultrasound, [phone] (1-2 muscles), [id number] (3+ muscles)- Dry Needling, Patient/Family education, Balance training, Stair training, Taping, Joint mobilization, Spinal mobilization, Scar mobilization, Compression bandaging, DME instructions, Cryotherapy, Moist heat, and Biofeedback  PLAN FOR NEXT SESSION: Was medical clearance for therapy participation obtained from MD?  Update HEP.  LE strengthening; LE coordination; balance; aerobic endurance; single leg balance.   Damien Caulk, PT 04/20/2024, 4:50 PM

## 2024-04-23 ENCOUNTER — Telehealth: Payer: Self-pay | Admitting: Physical Therapy

## 2024-04-23 ENCOUNTER — Ambulatory Visit: Admitting: Physical Therapy

## 2024-04-23 NOTE — Telephone Encounter (Signed)
 Patient Name: Terri Alexander MRN: [id number] DOB:[birth year], 73 y.o., female Today's Date: 04/23/2024  Called pt to let pt know that I have not been able to obtain medical clearance for therapy participation from MD [facility] (sent message Friday 04/20/24).  D/t this, today's PT visit cancelled (discussed with pt and pt in agreement).  Pt has follow-up visit 04/26/24 with MD [facility] and pt reports she will address PT medical clearance with MD at this visit.  Next scheduled PT visit planned for Friday 04/27/24 at 1:15pm; reviewed with pt.  Damien Caulk, PT 04/23/2024, 11:21 AM

## 2024-04-24 ENCOUNTER — Encounter: Payer: Self-pay | Admitting: Physical Therapy

## 2024-04-24 NOTE — Therapy (Signed)
 [facility] [address] [place], [place], [address] Phone: [phone]   Fax:  [phone]  Patient Details  Name: Terri Alexander MRN: [id number] Date of Birth: April 26, 1951 Referring Provider:  No ref. provider found  Encounter Date: 04/24/2024  Called pt to notify her that therapist received message from MD [facility]; MD reporting no concerns and cleared pt for therapy participation from his standpoint.  Attempted to reschedule 04/23/24 appointment but available appointment times on Wed and Thursday did not work for pt.  Pt plans to attend next scheduled PT appointment (Friday 04/27/24 at 1:15 pm).  Damien Caulk, PT 04/24/2024, 9:32 AM  [facility] [address] [place], [place], [address] Phone: [phone]   Fax:  [phone]

## 2024-04-26 ENCOUNTER — Ambulatory Visit (HOSPITAL_COMMUNITY)
Admission: RE | Admit: 2024-04-26 | Discharge: 2024-04-26 | Disposition: A | Discharge status: Home / Self Care | Attending: Surgery | Admitting: Surgery

## 2024-04-26 ENCOUNTER — Encounter: Payer: Self-pay | Admitting: Vascular Surgery

## 2024-04-26 ENCOUNTER — Ambulatory Visit: Admitting: Vascular Surgery

## 2024-04-26 VITALS — BP 129/80 | HR 73 | Temp 97.9°F | Resp 18 | Ht 62.0 in | Wt 137.2 lb

## 2024-04-26 DIAGNOSIS — Z8679 Personal history of other diseases of the circulatory system: Secondary | ICD-10-CM | POA: Diagnosis present

## 2024-04-26 DIAGNOSIS — Z95820 Peripheral vascular angioplasty status with implants and grafts: Secondary | ICD-10-CM | POA: Diagnosis not present

## 2024-04-26 DIAGNOSIS — Z9889 Other specified postprocedural states: Secondary | ICD-10-CM | POA: Insufficient documentation

## 2024-04-26 DIAGNOSIS — I878 Other specified disorders of veins: Secondary | ICD-10-CM | POA: Diagnosis not present

## 2024-04-26 NOTE — Progress Notes (Signed)
 Office Note    HPI: Terri Alexander is a 73 y.o. (Jun 14, 1950) female with history of aneurysmal degeneration of type B aortic dissection now status post 03/19/2024 Gore thoracic branch device placement complicated by left subclavian and axillary artery dissection requiring left brachial artery cutdown, embolectomy, Eluvia stenting of the axillary and brachial arteries, radial artery thrombosis.  Postop day 1 stroke.  On exam today, Terri Alexander was doing well, accompanied by her husband.  She completed inpatient rehab, and continues to undergo rehab in the outpatient setting.  She feels like she is back to normal other than a little weakness in the legs.  Also notes some tightness in the upper back, radiating to the shoulders.  Notes no issues in the left upper extremity.   The pt is not on a statin for cholesterol management.  The pt is not on a daily aspirin .   Other AC: Pradaxa  The pt is on medication for hypertension.   The pt is not diabetic.  Tobacco hx:  -  Past Medical History:  Diagnosis Date   Adenomatous colon polyp    Allergy    Anemia    as a child, no current problem   Arrhythmia    Atrial fibrillation (HCC)    Blood transfusion without reported diagnosis @ 9 months   9 months old   Cataract    bilateral - MD just watching    Dysrhythmia    A. Fib   GERD (gastroesophageal reflux disease)    History of hiatal hernia    Hyperlipidemia    Osteoporosis 04/15/2016   Peripheral vascular disease    Type B Aortic Dissection   Skin cancer 2013   Skin cancer- Nose, face   Stroke [facility])     Past Surgical History:  Procedure Laterality Date   ANGIOPLASTY, ARTERY, SUBCLAVIAN, ENDOVASCULAR, WITH STENT INSERTION Left 03/19/2024   Procedure: LEFT AXILLARY ARTERY STENT INSERTION;  Surgeon: [name], MD;  Location: [facility] OR;  Service: Vascular;  Laterality: Left;   ATRIAL FIBRILLATION ABLATION N/A 02/16/2023   Procedure: ATRIAL FIBRILLATION ABLATION;  Surgeon: [name], MD;  Location: [facility] INVASIVE CV LAB;  Service: Cardiovascular;  Laterality: N/A;   CARDIOVERSION N/A 09/09/2022   Procedure: CARDIOVERSION;  Surgeon: [name], MD;  Location: [facility] INVASIVE CV LAB;  Service: Cardiovascular;  Laterality: N/A;   CESAREAN SECTION  1981   x 1   CESAREAN SECTION     COLONOSCOPY  2016   hx polyp, tics, sigmoid colon mod tortuous Dr [name],    EMBOLECTOMY Left 03/19/2024   Procedure: EMBOLECTOMY OF LEFT BRACHIAL ARTERY;  Surgeon: [name], MD;  Location: [facility] OR;  Service: Vascular;  Laterality: Left;   EYE SURGERY Bilateral    retina repair x 2   FEMORAL ARTERY EXPLORATION Left 03/19/2024   Procedure: EXPOSURE OF BRACHIAL ARTERY WITH STENT INSERTION;  Surgeon: [name], MD;  Location: [facility] OR;  Service: Vascular;  Laterality: Left;   LAPAROTOMY  1979   removal of ovarian cyst   nissan fundiplication  05/17/1997   REPAIR OF COMPLEX TRACTION RETINAL DETACHMENT Bilateral 01/2019   SVT ABLATION N/A 12/09/2023   Procedure: SVT ABLATION;  Surgeon: [name], MD;  Location: [facility] INVASIVE CV LAB;  Service: Cardiovascular;  Laterality: N/A;   THORACIC AORTIC ENDOVASCULAR STENT GRAFT Left 03/19/2024   Procedure: INSERTION, ENDOVASCULAR STENT GRAFT, AORTA, THORACIC; STENTING X2 OF LEFT SUBCLAVIAN ARTERY;  Surgeon: [name], MD;  Location: [facility] OR;  Service: Vascular;  Laterality: Left;   ULTRASOUND GUIDANCE FOR VASCULAR ACCESS Bilateral 03/19/2024   Procedure: ULTRASOUND GUIDANCE, FOR VASCULAR ACCESS OF BILATERAL FEMORAL AND LEFT RADIAL ARTERY;  Surgeon: [name], MD;  Location: [facility] OR;  Service: Vascular;  Laterality: Bilateral;    Social History   Socioeconomic History   Marital status: Married    Spouse name: Not on file   Number of children: Not on file   Years of education: Not on file   Highest education level: 12th grade  Occupational History   Not on file  Tobacco Use   Smoking status: Never   Smokeless tobacco: Never    Tobacco comments:    Never smoked 12/16/23  Vaping Use   Vaping status: Never Used  Substance and Sexual Activity   Alcohol use: Yes    Alcohol/week: 1.0 standard drink of alcohol    Types: 1 Glasses of wine per week    Comment: rarely   Drug use: No   Sexual activity: Not Currently    Birth control/protection: Post-menopausal  Other Topics Concern   Not on file  Social History Narrative   Not on file   Social Drivers of Health   Tobacco Use: Low Risk (04/26/2024)   Patient History    Smoking Tobacco Use: Never    Smokeless Tobacco Use: Never    Passive Exposure: Not on file  Financial Resource Strain: Low Risk (11/14/2023)   Overall Financial Resource Strain (CARDIA)    Difficulty of Paying Living Expenses: Not hard at all  Food Insecurity: Unknown (04/13/2024)   Epic    Worried About Programme Researcher, Broadcasting/film/video in the Last Year: Never true    [employer] of Food in the Last Year: Not on file  Transportation Needs: No Transportation Needs (04/13/2024)   Epic    Lack of Transportation (Medical): No    Lack of Transportation (Non-Medical): No  Physical Activity: Insufficiently Active (04/13/2024)   Exercise Vital Sign    Days of Exercise per Week: 3 days    Minutes of Exercise per Session: 20 min  Stress: No Stress Concern Present (04/13/2024)   [employer] of Occupational Health - Occupational Stress Questionnaire    Feeling of Stress: Only a little  Social Connections: Unknown (04/13/2024)   Social Connection and Isolation Panel    Frequency of Communication with Friends and Family: More than three times a week    Frequency of Social Gatherings with Friends and Family: Three times a week    Attends Religious Services: 1 to 4 times per year    Active Member of Clubs or Organizations: Not on file    Attends Club or Organization Meetings: Not on file    Marital Status: Married  Intimate Partner Violence: Not At Risk (03/19/2024)   Epic    Fear of Current or Ex-Partner: No     Emotionally Abused: No    Physically Abused: No    Sexually Abused: No  Depression (PHQ2-9): Low Risk (04/02/2024)   Depression (PHQ2-9)    PHQ-2 Score: 0  Alcohol Screen: Low Risk (04/13/2024)   Alcohol Screen    Last Alcohol Screening Score (AUDIT): 1  Housing: Unknown (04/13/2024)   Epic    Unable to Pay for Housing in the Last Year: Not on file    Number of Times Moved in the Last Year: 0    Homeless in the Last Year: No  Utilities: Not At Risk (03/19/2024)   Epic    Threatened  with loss of utilities: No  Health Literacy: Adequate Health Literacy (06/30/2023)   B1300 Health Literacy    Frequency of need for help with medical instructions: Never   Family History  Problem Relation Age of Onset   Arthritis Mother    Heart disease Mother    Vision loss Mother    Stroke Mother    Arrhythmia Mother    Depression Sister    Transient ischemic attack Sister 62   Depression Sister    Cancer Maternal Grandmother    Cancer Maternal Grandfather    Early death Maternal Grandfather    Heart disease Maternal Grandfather    Early death Paternal Grandfather    Heart disease Paternal Grandfather    Diabetes Other        Juvenile   Arrhythmia Maternal Aunt    Colon cancer Neg Hx    Rectal cancer Neg Hx    Prostate cancer Neg Hx     Current Outpatient Medications  Medication Sig Dispense Refill   acetaminophen  (TYLENOL ) 325 MG tablet Take 1-2 tablets (325-650 mg total) by mouth every 4 (four) hours as needed for mild pain (pain score 1-3).     Ascorbic Acid (VITAMIN C) 1000 MG tablet Take 1,000 mg by mouth in the morning.     aspirin  EC 81 MG tablet Take 1 tablet (81 mg total) by mouth daily at 6 (six) AM. Swallow whole. 30 tablet 12   Biotin 1000 MCG tablet Take 1,000 mcg by mouth daily.     Calcium Carbonate-Vit D-Min (CALCIUM 1200 PO) Take 1 tablet by mouth in the morning.     Calcium Polycarbophil (FIBER-CAPS PO) Take 1 capsule by mouth in the morning.     cetirizine (ZYRTEC)  10 MG tablet Take 10 mg by mouth daily.     Cholecalciferol (VITAMIN D3) 125 MCG (5000 UT) TABS Take 5,000 Units by mouth in the morning.     Cranberry 500 MG CHEW Chew 500 mg by mouth in the morning.     cyclobenzaprine  (FLEXERIL ) 10 MG tablet Take 1 tablet (10 mg total) by mouth 3 (three) times daily as needed for muscle spasms. 30 tablet 1   dabigatran  (PRADAXA ) 150 MG CAPS capsule Take 1 capsule (150 mg total) by mouth 2 (two) times daily. 180 capsule 3   docusate sodium  (COLACE) 100 MG capsule Take 100 mg by mouth in the morning.     famotidine  (PEPCID ) 10 MG tablet Take 1 tablet (10 mg total) by mouth 2 (two) times daily. 60 tablet 0   fosfomycin (MONUROL ) 3 g PACK Take 3 g by mouth once.     fosfomycin (MONUROL ) 3 g PACK Take 3 g by mouth every three (3) days as needed. 6 g 0   gabapentin  (NEURONTIN ) 100 MG capsule Take 1 capsule (100 mg total) by mouth 3 (three) times daily as needed (nerve pain). 90 capsule 3   L-Lysine 500 MG CAPS Take 500 mg by mouth in the morning.     Lidocaine  (SALONPAS PAIN RELIEVING EX) Apply 1 Application topically daily as needed (pain).     Menthol-Methyl Salicylate (SALONPAS PAIN RELIEF PATCH EX) Apply 1 patch topically daily as needed (pain).     Multiple Vitamin (MULTIVITAMIN WITH MINERALS) TABS tablet Take 1 tablet by mouth in the morning.     omeprazole  (PRILOSEC  OTC) 20 MG tablet Take 20 mg by mouth daily as needed (acid reflux).     oxyCODONE  (OXY IR/ROXICODONE ) 5 MG immediate release tablet Take  1 tablet (5 mg total) by mouth every 4 (four) hours as needed for moderate pain (pain score 4-6). 28 tablet 0   polyethylene glycol (MIRALAX  / GLYCOLAX ) 17 g packet Take 17 g by mouth daily.     Probiotic Product (PROBIOTIC PO) Take 1 tablet by mouth in the morning.     senna-docusate (SENOKOT-S) 8.6-50 MG tablet Take 1 tablet by mouth 2 (two) times daily. 60 tablet 0   simethicone  (MYLICON) 80 MG chewable tablet Chew 1 tablet (80 mg total) by mouth 4 (four)  times daily as needed for flatulence.     simvastatin  (ZOCOR ) 10 MG tablet Take 1 tablet (10 mg total) by mouth at bedtime. 90 tablet 3   triamcinolone  cream (KENALOG ) 0.1 % Apply 1 Application topically 2 (two) times daily. 30 g 0   No current facility-administered medications for this visit.    Allergies  Allergen Reactions   Sulfamethoxazole -Trimethoprim       REVIEW OF SYSTEMS:  [X]  denotes positive finding, [ ]  denotes negative finding Cardiac  Comments:  Chest pain or chest pressure:    Shortness of breath upon exertion:    Short of breath when lying flat:    Irregular heart rhythm:        Vascular    Pain in calf, thigh, or hip brought on by ambulation:    Pain in feet at night that wakes you up from your sleep:     Blood clot in your veins:    Leg swelling:         Pulmonary    Oxygen at home:    Productive cough:     Wheezing:         Neurologic    Sudden weakness in arms or legs:     Sudden numbness in arms or legs:     Sudden onset of difficulty speaking or slurred speech:    Temporary loss of vision in one eye:     Problems with dizziness:         Gastrointestinal    Blood in stool:     Vomited blood:         Genitourinary    Burning when urinating:     Blood in urine:        Psychiatric    Major depression:         Hematologic    Bleeding problems:    Problems with blood clotting too easily:        Skin    Rashes or ulcers:        Constitutional    Fever or chills:      PHYSICAL EXAMINATION:  Vitals:   04/26/24 1210  BP: 129/80  Pulse: 73  Resp: 18  Temp: 97.9 F (36.6 C)  TempSrc: Temporal  SpO2: 99%  Weight: 137 lb 3.2 oz (62.2 kg)  Height: 5' 2 (1.575 m)    General:  WDWN in NAD; vital signs documented above Gait: Not observed HENT: WNL, normocephalic Pulmonary: normal non-labored breathing , without wheezing Cardiac: regular HR Abdomen: soft, NT, no masses Skin: without rashes Vascular Exam/Pulses:  Right Left   Radial 2+ (normal) 2+ (normal)  Ulnar 2+ 2+  Femoral    Popliteal    DP 2+ (normal) 2+ (normal)  PT     Extremities: without ischemic changes, without Gangrene , without cellulitis; without open wounds;  Musculoskeletal: no muscle wasting or atrophy  Neurologic: A&O X 3;  No focal weakness or paresthesias are detected  Psychiatric:  The pt has Normal affect.   Non-Invasive Vascular Imaging:    See CT    ASSESSMENT/PLAN: Terri Alexander is a 73 y.o. female presenting in follow-up with history of aneurysmal degeneration of type B aortic dissection now status post 03/19/2024 Gore thoracic branch device placement complicated by left subclavian and axillary artery dissection requiring left brachial artery cutdown, embolectomy, Eluvia stenting of the axillary and brachial arteries, radial artery thrombosis.  Postop day 1 stroke.  Overall, Terri Alexander is doing well. On physical exam, she has a palpable ulnar pulse in the left arm, palpable radial pulse.  The radial pulse is retrograde as the radial artery is known to be occluded. Imaging was reviewed demonstrating biphasic waveforms throughout the left upper extremity.  No concerns for flow-limiting stenosis at the axillary/brachial stents.  We had a long discussion regarding her surgery, and the complication associated, which occurred due to the wire sawing into the artery.  I was very thankful that we were able to make it out of the operation with a functional left upper extremity.  Regardless, she is aware that we are going to have to continue to follow the left upper extremity stents.  She is also aware that should the stents fail, she would likely require a carotid brachial artery bypass.  We also discussed the endoleak that she has at the [facility].  I have looked at it with all of my partners.  We believe that the endoleak is a type II endoleak from a lumbar artery.  There appears to be an excellent seal proximally, the snorkel appears to be sealed  as it is a Viabahn reinforced with a VBX.  I do not think that there is significant stenosis when looking at images in all 3 planes.  My plan is to continue to follow her closely.  I plan to see her back in 6 months time with repeat left upper extremity arterial duplex study as well as CT angio of the chest abdomen pelvis in an effort to assess for any further growth.  Terri Alexander was asked to call my office should any questions or concerns arise.     [name], MD [facility] [phone] Total time of patient care including pre-visit research, consultation, and documentation greater than 30 minutes

## 2024-04-27 ENCOUNTER — Ambulatory Visit: Admitting: Physical Therapy

## 2024-04-27 ENCOUNTER — Encounter: Payer: Self-pay | Admitting: Physical Therapy

## 2024-04-27 VITALS — BP 141/63 | HR 85

## 2024-04-27 DIAGNOSIS — R2689 Other abnormalities of gait and mobility: Secondary | ICD-10-CM | POA: Diagnosis not present

## 2024-04-27 DIAGNOSIS — I6310 Cerebral infarction due to embolism of unspecified precerebral artery: Secondary | ICD-10-CM

## 2024-04-27 DIAGNOSIS — M6281 Muscle weakness (generalized): Secondary | ICD-10-CM

## 2024-04-27 DIAGNOSIS — R278 Other lack of coordination: Secondary | ICD-10-CM

## 2024-04-27 NOTE — Therapy (Signed)
 OUTPATIENT PHYSICAL THERAPY NEURO TREATMENT   Patient Name: Terri Alexander MRN: [id number] DOB:January 31, 1951, 73 y.o., female Today's Date: 04/27/2024   PCP: Terri Butler DASEN, MD REFERRING PROVIDER: [name], NP  END OF SESSION:  PT End of Session - 04/27/24 1321     Visit Number 3    Number of Visits 17   16 visits plus eval   Date for Recertification  06/15/24    Authorization Type Medicare A&B/[employer]HC    Progress Note Due on Visit 10   Follow Medicare Guidelines   PT Start Time 1320    PT Stop Time 1400    PT Time Calculation (min) 40 min    Equipment Utilized During Treatment Gait belt    Activity Tolerance Patient tolerated treatment well    Behavior During Therapy WFL for tasks assessed/performed          Past Medical History:  Diagnosis Date   Adenomatous colon polyp    Allergy    Anemia    as a child, no current problem   Arrhythmia    Atrial fibrillation (HCC)    Blood transfusion without reported diagnosis @ 9 months   9 months old   Cataract    bilateral - MD just watching    Dysrhythmia    A. Fib   GERD (gastroesophageal reflux disease)    History of hiatal hernia    Hyperlipidemia    Osteoporosis 04/15/2016   Peripheral vascular disease    Type B Aortic Dissection   Skin cancer 2013   Skin cancer- Nose, face   Stroke [facility])    Past Surgical History:  Procedure Laterality Date   ANGIOPLASTY, ARTERY, SUBCLAVIAN, ENDOVASCULAR, WITH STENT INSERTION Left 03/19/2024   Procedure: LEFT AXILLARY ARTERY STENT INSERTION;  Surgeon: [name], MD;  Location: [facility] OR;  Service: Vascular;  Laterality: Left;   ATRIAL FIBRILLATION ABLATION N/A 02/16/2023   Procedure: ATRIAL FIBRILLATION ABLATION;  Surgeon: [name], MD;  Location: [facility] INVASIVE CV LAB;  Service: Cardiovascular;  Laterality: N/A;   CARDIOVERSION N/A 09/09/2022   Procedure: CARDIOVERSION;  Surgeon: [name], MD;  Location: [facility] INVASIVE CV LAB;  Service: Cardiovascular;   Laterality: N/A;   CESAREAN SECTION  1981   x 1   CESAREAN SECTION     COLONOSCOPY  2016   hx polyp, tics, sigmoid colon mod tortuous Dr [name],    EMBOLECTOMY Left 03/19/2024   Procedure: EMBOLECTOMY OF LEFT BRACHIAL ARTERY;  Surgeon: [name], MD;  Location: [facility] OR;  Service: Vascular;  Laterality: Left;   EYE SURGERY Bilateral    retina repair x 2   FEMORAL ARTERY EXPLORATION Left 03/19/2024   Procedure: EXPOSURE OF BRACHIAL ARTERY WITH STENT INSERTION;  Surgeon: [name], MD;  Location: [facility] OR;  Service: Vascular;  Laterality: Left;   LAPAROTOMY  1979   removal of ovarian cyst   nissan fundiplication  05/17/1997   REPAIR OF COMPLEX TRACTION RETINAL DETACHMENT Bilateral 01/2019   SVT ABLATION N/A 12/09/2023   Procedure: SVT ABLATION;  Surgeon: [name], MD;  Location: [facility] INVASIVE CV LAB;  Service: Cardiovascular;  Laterality: N/A;   THORACIC AORTIC ENDOVASCULAR STENT GRAFT Left 03/19/2024   Procedure: INSERTION, ENDOVASCULAR STENT GRAFT, AORTA, THORACIC; STENTING X2 OF LEFT SUBCLAVIAN ARTERY;  Surgeon: [name], MD;  Location: [facility] OR;  Service: Vascular;  Laterality: Left;   ULTRASOUND GUIDANCE FOR VASCULAR ACCESS Bilateral 03/19/2024   Procedure: ULTRASOUND GUIDANCE, FOR VASCULAR ACCESS OF  BILATERAL FEMORAL AND LEFT RADIAL ARTERY;  Surgeon: [name], MD;  Location: [facility] OR;  Service: Vascular;  Laterality: Bilateral;   Patient Active Problem List   Diagnosis Date Noted   CVA (cerebral vascular accident) (HCC) 03/23/2024   S/P AAA (abdominal aortic aneurysm) repair 03/19/2024   Thoracic aortic aneurysm, without rupture, unspecified 03/19/2024   Persistent atrial fibrillation (HCC) 08/25/2022   Hypercoagulable state due to persistent atrial fibrillation (HCC) 08/25/2022   Atrial fibrillation with RVR (HCC) 08/18/2022   Aortic dissection (HCC) 08/17/2022   Aortic dissection distal to left subclavian (HCC) 08/17/2022   Diverticulosis 04/19/2022    Diverticulosis of sigmoid colon 04/19/2022   Palpitations 06/24/2021   Premature atrial contraction 06/24/2021   PVC (premature ventricular contraction) 06/24/2021   Abnormal EKG 06/24/2021   Dyslipidemia 03/24/2018   Postmenopausal estrogen deficiency 03/24/2018   Squamous cell carcinoma 08/10/2017   HSV-1 (herpes simplex virus 1) infection 03/16/2017   Osteoporosis 04/15/2016   Vulvar atrophy 11/07/2014   Abnormal Pap smear of cervix 11/07/2014    ONSET DATE: 03/28/2024 (date of referral)  REFERRING DIAG: I63.10 (ICD-10-CM) - Cerebrovascular accident (CVA) due to embolism of precerebral artery (HCC)  THERAPY DIAG:  Other abnormalities of gait and mobility  Muscle weakness (generalized)  Other lack of coordination  Cerebrovascular accident (CVA) due to embolism of precerebral artery (HCC)  Rationale for Evaluation and Treatment: Rehabilitation  SUBJECTIVE: Likes to be called Terri Alexander.                                                                                                                                                                                           SUBJECTIVE STATEMENT: Pt reports no recent falls but did stumble a few times in house but able to catch self.  Went to Dr Lanis yesterday; stents looking good; no concerns with endoleak (pretty common occurrence per pt's understanding from MD).  Pt arrived ambulating without any AD use.  Went to grocery store this morning for first time in a while (longest she has been out); limited outing d/t B shoulder pain and R low back pain (has had that prior to surgery). Pt accompanied by: self and significant other (Husband [name] drove pt).  PERTINENT HISTORY: Multifocal CVA's s/p TEVAR with subclavian branch endoprosthesis 03/19/24 (LUE and B groin incisions) to repair thoracic/abdominal aortic aneurysm (chronic type B aortic dissection with aneurysmal degeneration of the descending aorta).  Case complicated by dissection of  the left subclavian artery with need for stenting of the axillary and brachial arteries to reestablish flow to left upper extremity. Pt with episode of transient aphasia that lasted a couple of  minutes 03/20/24; episode of unsteadiness while walking 03/21/24.  03/21/24 imaging: Small acute infarcts: B frontal lobes, L occipital lobe, and L cerebellum.  Inpatient rehab stay 03/23/24-03/30/24.  PMH also includes anemia, a-fib, arrhythmia, h/o hiatal hernia, PVD (type B aortic dissection), skin CA (nose, face).  PAIN:  Are you having pain? Yes: NPRS scale: 0/10 Pain location: B shoulders Pain description: muscle spasms R side Aggravating factors: unknown Relieving factors: heat; salon pass (patches)  PRECAUTIONS: Fall; no lifting anything heavy with L UE (less than gallon of milk) per pt  RED FLAGS: None   WEIGHT BEARING RESTRICTIONS: No  FALLS: Has patient fallen in last 6 months? No  LIVING ENVIRONMENT: Lives with: lives with their family (husband) Lives in: House/apartment (townhouse; 1 level) Stairs: Yes: External: 1 steps; none Has following equipment at home: Single point cane, shower chair, and Grab bars  PLOF: Independent prior to surgery.  CGA/SBA ambulation without AD in IP rehab.  PATIENT GOALS: Get strength back.  Improve balance.  OBJECTIVE:  Note: Objective measures were completed at Evaluation unless otherwise noted.  DIAGNOSTIC FINDINGS:  Per MRI Brain 03/21/24: 1. Small acute infarcts in the bilateral frontal lobes, left occipital lobe, and left cerebellum. Given involvement of multiple vascular territories, consider embolic etiology.  COGNITION: Overall cognitive status: Within functional limits for tasks assessed   SENSATION: WFL (light touch)  COORDINATION: Good B heel to shin coordination in sitting.  Difficulty with rapid alternating toe taps in sitting B.  EDEMA:  General LE swelling (has had since surgery)  MUSCLE TONE: WNL  POSTURE: rounded  shoulders and forward head  LOWER EXTREMITY MMT:    MMT Right Eval Left Eval  Hip flexion 4/5 4/5  Hip extension    Hip abduction    Hip adduction    Hip internal rotation    Hip external rotation    Knee flexion 4/5 4/5  Knee extension 4+/5 4+/5  Ankle dorsiflexion 4+/5 4+/5  Ankle plantarflexion At least 3/5 AROM At least 3/5 AROM  Ankle inversion    Ankle eversion    (Blank rows = not tested)  BED MOBILITY:  Independent  TRANSFERS: Sit to stand: Modified independence  Assistive device utilized: None     Stand to sit: Modified independence  Assistive device utilized: None      GAIT: Findings: Gait Characteristics: step through gait pattern, Distance walked: clinic distances, Assistive device utilized:None, Level of assistance: SBA, and Comments: no loss of balance ambulating without AD use; pt appearing fatigued though after  FUNCTIONAL TESTS:  5 times sit to stand: 12.09 seconds 10 meter walk test: 0.87 m/sec (11.38 seconds); HR 104 bpm post activity SLS: R LE 5.10 seconds; L LE unable to perform without assist for balance (52/56 on BERG 03/29/24 in inpatient rehab) FGA: 18/30 (no AD use) 04/16/24 : 1096 feet (no AD use) 04/16/24  FUNCTIONAL GAIT ASSESSMENT  Date: 04/16/24 Score  GAIT LEVEL SURFACE Instructions: Walk at your normal speed from here to the next mark (6 m) [20 ft]. (2) Mild impairment - Walks 6 m (20 ft) in less than 7 seconds but greater than 5.5 seconds, uses assistive device, slower speed, mild gait deviations, or deviates 15.24 -25.4 cm (6 -10 in) outside of the 30.48-cm (12-in) walkway width. 6.09 seconds  2.   CHANGE IN GAIT SPEED Instructions: Begin walking at your normal pace (for 1.5 m [5 ft]). When I tell you go, walk as fast as you can (for 1.5 m [5 ft]). When  I tell you slow, walk as slowly as you can (for 1.5 m [5 ft]. (2) Mild impairment - Is able to change speed but demonstrates mild gait deviations, deviates 15.24 -25.4 cm (6 -10  in) outside of the 30.48-cm (12-in) walkway width, or no gait deviations but unable to achieve a significant change in velocity, or uses an assistive device  3.    GAIT WITH HORIZONTAL HEAD TURNS Instructions: Walk from here to the next mark 6 m (20 ft) away. Begin walking at your normal pace. Keep walking straight; after 3 steps, turn your head to the right and keep walking straight while looking to the right. After 3 more steps, turn your head to the left and keep walking straight while looking left. Continue alternating looking right and left. (3) Normal - Performs head turns smoothly with no change in gait. Deviates no more than 15.24 cm (6 in) outside 30.48-cm (12-in) walkway width.  4.   GAIT WITH VERTICAL HEAD TURNS Instructions: Walk from here to the next mark (6 m [20 ft]). Begin walking at your normal pace. Keep walking straight; after 3 steps, tip your head up and keep walking straight while looking up. After 3 more steps, tip your head down, keep walking straight while looking down. Continue  alternating looking up and down every 3 steps until you have completed 2 repetitions in each direction. (2) Mild impairment - Performs task with slight change in gait velocity (eg, minor disruption to smooth gait path), deviates 15.24 -25.4 cm (6 -10 in) outside 30.48-cm (12-in) walkway width or uses assistive device.  5.  GAIT AND PIVOT TURN Instructions: Begin with walking at your normal pace. When I tell you, turn and stop, turn as quickly as you can to face the opposite direction and stop. (3) Normal - Pivot turns safely within 3 seconds and stops quickly with no loss of balance  6.   STEP OVER OBSTACLE Instructions: Begin walking at your normal speed. When you come to the shoe box, step over it, not around it, and keep walking. (2) Mild impairment - Is able to step over one shoe box (11.43 cm [4.5 in] total height) without changing gait speed; no evidence of imbalance.  7.   GAIT WITH NARROW BASE OF  SUPPORT Instructions: Walk on the floor with arms folded across the chest, feet aligned heel to toe in tandem for a distance of 3.6 m [12 ft]. The number of steps taken in a straight line are counted for a maximum of 10 steps. (0) Severe impairment - Ambulates less than 4 steps heel to toe or cannot perform without assistance.  8.   GAIT WITH EYES CLOSED Instructions: Walk at your normal speed from here to the next mark (6 m [20 ft]) with your eyes closed. (0) Severe impairment - Cannot walk 6 m (20 ft) without assistance, severe gait deviations or imbalance, deviates greater than 38.1 cm (15 in) outside 30.48-cm (12-in) walkway width or will not attempt task. 8.28 seconds with significant R veer.  9.   AMBULATING BACKWARDS Instructions: Walk backwards until I tell you to stop (2) Mild impairment - Walks 6 m (20 ft), uses assistive device, slower speed, mild gait deviations, deviates 15.24 -25.4 cm (6 -10 in) outside 30.48-cm (12-in) walkway width. 12.68 seconds  10. STEPS Instructions: Walk up these stairs as you would at home (ie, using the rail if necessary). At the top turn around and walk down. (2) Mild impairment-Alternating feet, must use rail.  Total 18/30   Interpretation of scores: Non-Specific Older Adults Cutoff Score: <=22/30 = risk of falls Parkinsons Disease Cutoff score <15/30= fall risk (Hoehn & Yahr 1-4)  Minimally Clinically Important Difference (MCID)  Stroke (acute, subacute, and chronic) = MDC: 4.2 points Vestibular (acute) = MDC: 6 points Community Dwelling Older Adults =  MCID: 4 points Parkinsons Disease  =  MDC: 4.3 points  (Academy of Neurologic Physical Therapy (nd). Functional Gait Assessment. Retrieved from https://www.neuropt.org/docs/default-source/cpgs/core-outcome-measures/function-gait-assessment-pocket-guide-proof9-(2).pdf?sfvrsn=b37f35043_0.)   PATIENT SURVEYS:  TBA                                                                                                                               TREATMENT DATE: 04/27/24  Self Care: BP and HR taken in sitting at rest beginning of session (see below for details). Vitals:   04/27/24 1328  BP: (!) 141/63  Pulse: 85  Educated to monitor BP 1x/day (pt reports being inconsistent with this)  Therapeutic Activity: Standing balance in // bars: Wobble board (lateral): x1 minutes x 3 sets; no UE support Notes: tends to have weight shifted to R side; CGA Wobble board (ant/post): x1 minute x 3 sets; no UE support Notes: CGA except min assist 1x each trial d/t posterior LOB Standing on Airex: Mini-squats no UE support: x10 reps x2 trials; CGA for safety Notes: vc's to decreased R LE internal rotation during squat Static standing (feet shoulder width apart) with EC x1 minute (mild sway noted; CGA for safety)  Therapeutic Exercise: Reviewed HEP from inpatient rehab and modified and added to program (see HEP program below) Clamshells: x10 reps B LE's (x2 sets) Seated Hip Abduction/External Rotation With Resistance at Distal Thighs (with red theraband) x10 reps  PATIENT EDUCATION: Education details: Encouraged pt to monitor her BP 1x/day.  Issued red thera-band and reviewed/added to HEP. Person educated: Patient and Spouse Education method: Explanation, Verbal cues, and Handouts Education comprehension: verbalized understanding, verbal cues required, and needs further education  HOME EXERCISE PROGRAM: Access Code: [email or web address] URL: [email or web address] Date: 04/27/2024 Prepared by: Damien Caulk  Exercises - Standing Tandem Balance with Counter Support  - 1 x daily - 5 x weekly - 2-3 sets - 10 reps - Mini Squat with Counter Support  - 1 x daily - 5 x weekly - 2-3 sets - 10 reps - Heel Raises with Counter Support  - 1 x daily - 5 x weekly - 2-3 sets - 10 reps - Seated Hip Abduction/External Rotation With Resistance at Thighs  - 1 x daily - 5 x weekly - 2-3 sets - 10  reps  GOALS: Goals reviewed with patient? Yes  SHORT TERM GOALS: Target date: 05/02/2024  Pt will be independent with initial HEP in order to improve strength and balance in order to decrease fall risk and improve function at home for ADL's. Baseline:  TBA Goal status: INITIAL  2.  Assess FGA and update LTG as needed. Baseline: 18/30 04/16/24 Goal status: MET  3.  Assess and update LTG as needed. Baseline: 1096 feet on 04/16/24 Goal status: MET  4.  Pt will increase by at least 0.13 m/s in order to demonstrate clinically significant improvement in community ambulation.  Baseline: 0.87 m/sec Goal status: INITIAL  LONG TERM GOALS: Target date: 05/30/2024  Pt will be independent with final HEP in order to improve strength and balance in order to decrease fall risk and improve function at home for ADL's. Baseline:  Goal status: INITIAL  2.  Patient will increase Functional Gait Assessment score to > or equal to 22/30 as to reduce fall risk and improve dynamic gait safety with community ambulation. Baseline: 18/30 04/16/24 Goal status: INITIAL  3.  Patient will tolerate 10 seconds of single leg stance B LE's without loss of balance to improve ability to get in and out of shower safely. Baseline: R LE 5.10 seconds; L LE unable to perform without assist for balance Goal status: INITIAL  4.  Pt will increase by at least 60m (154ft) in order to demonstrate clinically significant improvement in cardiopulmonary endurance and community ambulation. Baseline: 1096 feet on 04/16/24 Goal status: INITIAL   ASSESSMENT:  CLINICAL IMPRESSION: Received therapy clearance from MD Lanis for therapy participation (no concerns noted by MD). Patient was seen today for physical therapy treatment to address balance and strength.  Focused session on balance activities (on wobble board; on compliant surface) and also reviewing and modifying/updating HEP for LE strength (pt issued written  handout).  Patient continues to be limited by strength and balance.  They demonstrate appropriate understanding of HEP.  They would continue to benefit from skilled PT to address impairments as noted and progress towards long term goals.  OBJECTIVE IMPAIRMENTS: Abnormal gait, cardiopulmonary status limiting activity, decreased activity tolerance, decreased balance, decreased coordination, decreased endurance, decreased knowledge of condition, decreased knowledge of use of DME, decreased mobility, difficulty walking, decreased strength, improper body mechanics, postural dysfunction, and pain.   ACTIVITY LIMITATIONS: carrying, lifting, bending, squatting, stairs, bathing, toileting, dressing, and locomotion level  PARTICIPATION LIMITATIONS: meal prep, cleaning, laundry, driving, shopping, community activity, and yard work  PERSONAL FACTORS: Age, Past/current experiences, Transportation, and 1-2 comorbidities: a-fib and PVD are also affecting patient's functional outcome.   REHAB POTENTIAL: Good  CLINICAL DECISION MAKING: Evolving/moderate complexity  EVALUATION COMPLEXITY: Moderate  PLAN:  PT FREQUENCY: 2x/week  PT DURATION: 8 weeks  PLANNED INTERVENTIONS: [id number]- PT Re-evaluation, 97750- Physical Performance Testing, 97110-Therapeutic exercises, 97530- Therapeutic activity, [id number]- Neuromuscular re-education, 97535- Self Care, [id number]- Manual therapy, [id number]- Gait training, [phone]- Orthotic Initial, [phone]- Orthotic/Prosthetic subsequent, [phone]- Aquatic Therapy, [phone]- Electrical stimulation (manual), [id number]- Ultrasound, [phone] (1-2 muscles), [id number] (3+ muscles)- Dry Needling, Patient/Family education, Balance training, Stair training, Taping, Joint mobilization, Spinal mobilization, Scar mobilization, Compression bandaging, DME instructions, Cryotherapy, Moist heat, and Biofeedback  PLAN FOR NEXT SESSION: STG's due.  Check on HEP/update?.  LE strengthening; LE coordination; balance; aerobic endurance;  single leg balance.   Damien Caulk, PT 04/27/2024, 3:24 PM

## 2024-04-30 ENCOUNTER — Encounter: Payer: Self-pay | Admitting: Physical Therapy

## 2024-04-30 ENCOUNTER — Ambulatory Visit: Admitting: Physical Therapy

## 2024-04-30 VITALS — BP 157/64 | HR 82

## 2024-04-30 DIAGNOSIS — M6281 Muscle weakness (generalized): Secondary | ICD-10-CM

## 2024-04-30 DIAGNOSIS — R2689 Other abnormalities of gait and mobility: Secondary | ICD-10-CM

## 2024-04-30 DIAGNOSIS — R278 Other lack of coordination: Secondary | ICD-10-CM

## 2024-04-30 NOTE — Therapy (Signed)
 OUTPATIENT PHYSICAL THERAPY NEURO TREATMENT   Patient Name: Terri Alexander MRN: [id number] DOB:[birth year], 73 y.o., female Today's Date: 04/30/2024   PCP: Terri Butler DASEN, MD REFERRING PROVIDER: [name], NP  END OF SESSION:  PT End of Session - 04/30/24 1232     Visit Number 4    Number of Visits 17   16 visits plus eval   Date for Recertification  06/15/24    Authorization Type Medicare A&B/[employer]    Progress Note Due on Visit 10   Follow Medicare Guidelines   PT Start Time 1230    PT Stop Time 1315    PT Time Calculation (min) 45 min    Equipment Utilized During Treatment Gait belt    Activity Tolerance Patient tolerated treatment well    Behavior During Therapy WFL for tasks assessed/performed          Past Medical History:  Diagnosis Date   Adenomatous colon polyp    Allergy    Anemia    as a child, no current problem   Arrhythmia    Atrial fibrillation (HCC)    Blood transfusion without reported diagnosis @ 9 months   9 months old   Cataract    bilateral - MD just watching    Dysrhythmia    A. Fib   GERD (gastroesophageal reflux disease)    History of hiatal hernia    Hyperlipidemia    Osteoporosis 04/15/2016   Peripheral vascular disease    Type B Aortic Dissection   Skin cancer 2013   Skin cancer- Nose, face   Stroke [facility])    Past Surgical History:  Procedure Laterality Date   ANGIOPLASTY, ARTERY, SUBCLAVIAN, ENDOVASCULAR, WITH STENT INSERTION Left 03/19/2024   Procedure: LEFT AXILLARY ARTERY STENT INSERTION;  Surgeon: Terri Fonda BRAVO, MD;  Location: [facility] OR;  Service: Vascular;  Laterality: Left;   ATRIAL FIBRILLATION ABLATION N/A 02/16/2023   Procedure: ATRIAL FIBRILLATION ABLATION;  Surgeon: Terri Eulas BRAVO, MD;  Location: [facility] INVASIVE CV LAB;  Service: Cardiovascular;  Laterality: N/A;   CARDIOVERSION N/A 09/09/2022   Procedure: CARDIOVERSION;  Surgeon: Terri Ronal BRAVO, MD;  Location: [facility] INVASIVE CV LAB;  Service: Cardiovascular;   Laterality: N/A;   CESAREAN SECTION  1981   x 1   CESAREAN SECTION     COLONOSCOPY  2016   hx polyp, tics, sigmoid colon mod tortuous Dr Terri Alexander,    EMBOLECTOMY Left 03/19/2024   Procedure: EMBOLECTOMY OF LEFT BRACHIAL ARTERY;  Surgeon: Terri Fonda BRAVO, MD;  Location: [facility] OR;  Service: Vascular;  Laterality: Left;   EYE SURGERY Bilateral    retina repair x 2   FEMORAL ARTERY EXPLORATION Left 03/19/2024   Procedure: EXPOSURE OF BRACHIAL ARTERY WITH STENT INSERTION;  Surgeon: Terri Fonda BRAVO, MD;  Location: [facility] OR;  Service: Vascular;  Laterality: Left;   LAPAROTOMY  1979   removal of ovarian cyst   nissan fundiplication  05/17/1997   REPAIR OF COMPLEX TRACTION RETINAL DETACHMENT Bilateral 01/2019   SVT ABLATION N/A 12/09/2023   Procedure: SVT ABLATION;  Surgeon: Terri Eulas BRAVO, MD;  Location: [facility] INVASIVE CV LAB;  Service: Cardiovascular;  Laterality: N/A;   THORACIC AORTIC ENDOVASCULAR STENT GRAFT Left 03/19/2024   Procedure: INSERTION, ENDOVASCULAR STENT GRAFT, AORTA, THORACIC; STENTING X2 OF LEFT SUBCLAVIAN ARTERY;  Surgeon: Terri Fonda BRAVO, MD;  Location: [facility] OR;  Service: Vascular;  Laterality: Left;   ULTRASOUND GUIDANCE FOR VASCULAR ACCESS Bilateral 03/19/2024   Procedure: ULTRASOUND GUIDANCE, FOR VASCULAR ACCESS OF  BILATERAL FEMORAL AND LEFT RADIAL ARTERY;  Surgeon: Terri Fonda BRAVO, MD;  Location: [facility] OR;  Service: Vascular;  Laterality: Bilateral;   Patient Active Problem List   Diagnosis Date Noted   CVA (cerebral vascular accident) (HCC) 03/23/2024   S/P AAA (abdominal aortic aneurysm) repair 03/19/2024   Thoracic aortic aneurysm, without rupture, unspecified 03/19/2024   Persistent atrial fibrillation (HCC) 08/25/2022   Hypercoagulable state due to persistent atrial fibrillation (HCC) 08/25/2022   Atrial fibrillation with RVR (HCC) 08/18/2022   Aortic dissection (HCC) 08/17/2022   Aortic dissection distal to left subclavian (HCC) 08/17/2022   Diverticulosis 04/19/2022    Diverticulosis of sigmoid colon 04/19/2022   Palpitations 06/24/2021   Premature atrial contraction 06/24/2021   PVC (premature ventricular contraction) 06/24/2021   Abnormal EKG 06/24/2021   Dyslipidemia 03/24/2018   Postmenopausal estrogen deficiency 03/24/2018   Squamous cell carcinoma 08/10/2017   HSV-1 (herpes simplex virus 1) infection 03/16/2017   Osteoporosis 04/15/2016   Vulvar atrophy 11/07/2014   Abnormal Pap smear of cervix 11/07/2014    ONSET DATE: 03/28/2024 (date of referral)  REFERRING DIAG: I63.10 (ICD-10-CM) - Cerebrovascular accident (CVA) due to embolism of precerebral artery (HCC)  THERAPY DIAG:  Other abnormalities of gait and mobility  Muscle weakness (generalized)  Other lack of coordination  Rationale for Evaluation and Treatment: Rehabilitation  SUBJECTIVE: Likes to be called Terri Alexander.                                                                                                                                                                                           SUBJECTIVE STATEMENT: No recent falls.  Pt did catch toe when walking into therapy area but able to catch self.  No acute changes.  No change in R low back pain; shoulders better but gets twinges with certain activities/movements. Pt accompanied by: self and significant other (Husband [name] drove pt).  PERTINENT HISTORY: Multifocal CVA's s/p TEVAR with subclavian branch endoprosthesis 03/19/24 (LUE and B groin incisions) to repair thoracic/abdominal aortic aneurysm (chronic type B aortic dissection with aneurysmal degeneration of the descending aorta).  Case complicated by dissection of the left subclavian artery with need for stenting of the axillary and brachial arteries to reestablish flow to left upper extremity. Pt with episode of transient aphasia that lasted a couple of minutes 03/20/24; episode of unsteadiness while walking 03/21/24.  03/21/24 imaging: Small acute infarcts: B frontal lobes,  L occipital lobe, and L cerebellum.  Inpatient rehab stay 03/23/24-03/30/24.  PMH also includes anemia, a-fib, arrhythmia, h/o hiatal hernia, PVD (type B aortic dissection), skin CA (nose, face).  PAIN: 2/10 R LBP Are you having  pain? Yes: NPRS scale: 0/10 Pain location: B shoulders Pain description: muscle spasms R side Aggravating factors: unknown Relieving factors: heat; salon pass (patches)  PRECAUTIONS: Fall; no lifting anything heavy with L UE (less than gallon of milk) per pt  RED FLAGS: None   WEIGHT BEARING RESTRICTIONS: No  FALLS: Has patient fallen in last 6 months? No  LIVING ENVIRONMENT: Lives with: lives with their family (husband) Lives in: House/apartment (townhouse; 1 level) Stairs: Yes: External: 1 steps; none Has following equipment at home: Single point cane, shower chair, and Grab bars  PLOF: Independent prior to surgery.  CGA/SBA ambulation without AD in IP rehab.  PATIENT GOALS: Get strength back.  Improve balance.  OBJECTIVE:  Note: Objective measures were completed at Evaluation unless otherwise noted.  DIAGNOSTIC FINDINGS:  Per MRI Brain 03/21/24: 1. Small acute infarcts in the bilateral frontal lobes, left occipital lobe, and left cerebellum. Given involvement of multiple vascular territories, consider embolic etiology.  COGNITION: Overall cognitive status: Within functional limits for tasks assessed   SENSATION: WFL (light touch)  COORDINATION: Good B heel to shin coordination in sitting.  Difficulty with rapid alternating toe taps in sitting B.  EDEMA:  General LE swelling (has had since surgery)  MUSCLE TONE: WNL  POSTURE: rounded shoulders and forward head  LOWER EXTREMITY MMT:    MMT Right Eval Left Eval  Hip flexion 4/5 4/5  Hip extension    Hip abduction    Hip adduction    Hip internal rotation    Hip external rotation    Knee flexion 4/5 4/5  Knee extension 4+/5 4+/5  Ankle dorsiflexion 4+/5 4+/5  Ankle  plantarflexion At least 3/5 AROM At least 3/5 AROM  Ankle inversion    Ankle eversion    (Blank rows = not tested)  BED MOBILITY:  Independent  TRANSFERS: Sit to stand: Modified independence  Assistive device utilized: None     Stand to sit: Modified independence  Assistive device utilized: None      GAIT: Findings: Gait Characteristics: step through gait pattern, Distance walked: clinic distances, Assistive device utilized:None, Level of assistance: SBA, and Comments: no loss of balance ambulating without AD use; pt appearing fatigued though after  FUNCTIONAL TESTS:  5 times sit to stand: 12.09 seconds 10 meter walk test: 0.87 m/sec (11.38 seconds); HR 104 bpm post activity; 1.04 m/sec (9.59 seconds) 04/30/24 SLS: R LE 5.10 seconds; L LE unable to perform without assist for balance (52/56 on BERG 03/29/24 in inpatient rehab) FGA: 18/30 (no AD use) 04/16/24 : 1096 feet (no AD use) 04/16/24  FUNCTIONAL GAIT ASSESSMENT  Date: 04/16/24 Score  GAIT LEVEL SURFACE Instructions: Walk at your normal speed from here to the next mark (6 m) [20 ft]. (2) Mild impairment - Walks 6 m (20 ft) in less than 7 seconds but greater than 5.5 seconds, uses assistive device, slower speed, mild gait deviations, or deviates 15.24 -25.4 cm (6 -10 in) outside of the 30.48-cm (12-in) walkway width. 6.09 seconds  2.   CHANGE IN GAIT SPEED Instructions: Begin walking at your normal pace (for 1.5 m [5 ft]). When I tell you go, walk as fast as you can (for 1.5 m [5 ft]). When I tell you slow, walk as slowly as you can (for 1.5 m [5 ft]. (2) Mild impairment - Is able to change speed but demonstrates mild gait deviations, deviates 15.24 -25.4 cm (6 -10 in) outside of the 30.48-cm (12-in) walkway width, or no gait deviations but  unable to achieve a significant change in velocity, or uses an assistive device  3.    GAIT WITH HORIZONTAL HEAD TURNS Instructions: Walk from here to the next mark 6 m (20 ft) away.  Begin walking at your normal pace. Keep walking straight; after 3 steps, turn your head to the right and keep walking straight while looking to the right. After 3 more steps, turn your head to the left and keep walking straight while looking left. Continue alternating looking right and left. (3) Normal - Performs head turns smoothly with no change in gait. Deviates no more than 15.24 cm (6 in) outside 30.48-cm (12-in) walkway width.  4.   GAIT WITH VERTICAL HEAD TURNS Instructions: Walk from here to the next mark (6 m [20 ft]). Begin walking at your normal pace. Keep walking straight; after 3 steps, tip your head up and keep walking straight while looking up. After 3 more steps, tip your head down, keep walking straight while looking down. Continue  alternating looking up and down every 3 steps until you have completed 2 repetitions in each direction. (2) Mild impairment - Performs task with slight change in gait velocity (eg, minor disruption to smooth gait path), deviates 15.24 -25.4 cm (6 -10 in) outside 30.48-cm (12-in) walkway width or uses assistive device.  5.  GAIT AND PIVOT TURN Instructions: Begin with walking at your normal pace. When I tell you, turn and stop, turn as quickly as you can to face the opposite direction and stop. (3) Normal - Pivot turns safely within 3 seconds and stops quickly with no loss of balance  6.   STEP OVER OBSTACLE Instructions: Begin walking at your normal speed. When you come to the shoe box, step over it, not around it, and keep walking. (2) Mild impairment - Is able to step over one shoe box (11.43 cm [4.5 in] total height) without changing gait speed; no evidence of imbalance.  7.   GAIT WITH NARROW BASE OF SUPPORT Instructions: Walk on the floor with arms folded across the chest, feet aligned heel to toe in tandem for a distance of 3.6 m [12 ft]. The number of steps taken in a straight line are counted for a maximum of 10 steps. (0) Severe impairment - Ambulates  less than 4 steps heel to toe or cannot perform without assistance.  8.   GAIT WITH EYES CLOSED Instructions: Walk at your normal speed from here to the next mark (6 m [20 ft]) with your eyes closed. (0) Severe impairment - Cannot walk 6 m (20 ft) without assistance, severe gait deviations or imbalance, deviates greater than 38.1 cm (15 in) outside 30.48-cm (12-in) walkway width or will not attempt task. 8.28 seconds with significant R veer.  9.   AMBULATING BACKWARDS Instructions: Walk backwards until I tell you to stop (2) Mild impairment - Walks 6 m (20 ft), uses assistive device, slower speed, mild gait deviations, deviates 15.24 -25.4 cm (6 -10 in) outside 30.48-cm (12-in) walkway width. 12.68 seconds  10. STEPS Instructions: Walk up these stairs as you would at home (ie, using the rail if necessary). At the top turn around and walk down. (2) Mild impairment-Alternating feet, must use rail.  Total 18/30   Interpretation of scores: Non-Specific Older Adults Cutoff Score: <=22/30 = risk of falls Parkinsons Disease Cutoff score <15/30= fall risk (Hoehn & Yahr 1-4)  Minimally Clinically Important Difference (MCID)  Stroke (acute, subacute, and chronic) = MDC: 4.2 points Vestibular (acute) = MDC:  6 points Community Dwelling Older Adults =  MCID: 4 points Parkinsons Disease  =  MDC: 4.3 points  (Academy of Neurologic Physical Therapy (nd). Functional Gait Assessment. Retrieved from https://www.neuropt.org/docs/default-source/cpgs/core-outcome-measures/function-gait-assessment-pocket-guide-proof9-(2).pdf?sfvrsn=b35f35043_0.)   PATIENT SURVEYS:  TBA                                                                                                                              TREATMENT DATE: 04/30/24  Self Care: BP and HR taken in sitting at rest beginning of session (see below for details).  Is not on BP medication; pt took BP this morning and was good.  No lightheadedness/dizziness or HA  reported.  Symptoms monitored for during session. Vitals:   04/30/24 1235  BP: (!) 157/64  Pulse: 82    Therapeutic Activity: : 1.04 m/sec  Toe taps at steps (to 12 inch/2nd step); no UE support: x10 reps x3 sets B LE's Notes: 1 loss of balance with toe taps L LE on 1st trial (CGA for safety) Toe taps at steps standing on Airex (to 6 inch step to Airex to 12 inch step to Airex =1 rep): x10 reps B LE's; no UE support; CGA for safety Notes: mild coordination impairments noted occasionally  Therapeutic Exercise: Step ups (keeping foot on step) to increase LE strength: x10 reps B LE's (no UE support) Notes: pt fatigued with activity; HR 80 bpm after sitting down SciFit multi-peaks up to level 2 for 6 minutes using BLEs for LE strengthening, dynamic cardiovascular cooldown and increased amplitude of stepping. RPE of 5-6/10 with HR 88 bpm following activity.  Average stride length 10.5 inches.  PATIENT EDUCATION: Education details: Encouraged pt to monitor her BP 1x in morning and 1x in afternoon.  STG results. Person educated: Patient Education method: Explanation and Verbal cues Education comprehension: verbalized understanding, verbal cues required, and needs further education  HOME EXERCISE PROGRAM: Access Code: [email or web address] URL: [email or web address] Date: 04/27/2024 Prepared by: Damien Caulk  Exercises - Standing Tandem Balance with Counter Support  - 1 x daily - 5 x weekly - 2-3 sets - 10 reps - Mini Squat with Counter Support  - 1 x daily - 5 x weekly - 2-3 sets - 10 reps - Heel Raises with Counter Support  - 1 x daily - 5 x weekly - 2-3 sets - 10 reps - Seated Hip Abduction/External Rotation With Resistance at Thighs  - 1 x daily - 5 x weekly - 2-3 sets - 10 reps  GOALS: Goals reviewed with patient? Yes  SHORT TERM GOALS: Target date: 05/02/2024  Pt will be independent with initial HEP in order to improve strength and balance in order to decrease fall  risk and improve function at home for ADL's. Baseline:  Issued Goal status: MET  2.  Assess FGA and update LTG as needed. Baseline: 18/30 04/16/24 Goal status: MET  3.  Assess and update LTG as needed. Baseline: 1096 feet on 04/16/24 Goal status: MET  4.  Pt will increase by at least 0.13 m/s in order to demonstrate clinically significant improvement in community ambulation.  Baseline: 0.87 m/sec; 1.04 m/sec 04/30/24 Goal status: MET  LONG TERM GOALS: Target date: 05/30/2024  Pt will be independent with final HEP in order to improve strength and balance in order to decrease fall risk and improve function at home for ADL's. Baseline:  Goal status: INITIAL  2.  Patient will increase Functional Gait Assessment score to > or equal to 22/30 as to reduce fall risk and improve dynamic gait safety with community ambulation. Baseline: 18/30 04/16/24 Goal status: INITIAL  3.  Patient will tolerate 10 seconds of single leg stance B LE's without loss of balance to improve ability to get in and out of shower safely. Baseline: R LE 5.10 seconds; L LE unable to perform without assist for balance Goal status: INITIAL  4.  Pt will increase by at least 4m (156ft) in order to demonstrate clinically significant improvement in cardiopulmonary endurance and community ambulation. Baseline: 1096 feet on 04/16/24 Goal status: INITIAL   ASSESSMENT:  CLINICAL IMPRESSION: Patient was seen today for physical therapy treatment to address STG's, foot clearance for ambulation, and strengthening.  Pt met 4/4 STG's.  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  Also performed activities to improve LE step height and  length and overall LE strengthening.  Patient continues to be limited by strength and balance.  They demonstrate improvement in gait speed as noted by .  They would continue to benefit from skilled PT to address impairments as noted and progress towards long term goals.  OBJECTIVE IMPAIRMENTS: Abnormal gait, cardiopulmonary status limiting activity, decreased activity tolerance, decreased balance, decreased coordination, decreased endurance, decreased knowledge of condition, decreased knowledge of use of DME, decreased mobility, difficulty walking, decreased strength, improper body mechanics, postural dysfunction, and pain.   ACTIVITY LIMITATIONS: carrying, lifting, bending, squatting, stairs, bathing, toileting, dressing, and locomotion level  PARTICIPATION LIMITATIONS: meal prep, cleaning, laundry, driving, shopping, community activity, and yard work  PERSONAL FACTORS: Age, Past/current experiences, Transportation, and 1-2 comorbidities: a-fib and PVD are also affecting patient's functional outcome.   REHAB POTENTIAL: Good  CLINICAL DECISION MAKING: Evolving/moderate complexity  EVALUATION COMPLEXITY: Moderate  PLAN:  PT FREQUENCY: 2x/week  PT DURATION: 8 weeks  PLANNED INTERVENTIONS: [id number]- PT Re-evaluation, 97750- Physical Performance Testing, 97110-Therapeutic exercises, 97530- Therapeutic activity, [id number]- Neuromuscular re-education, 97535- Self Care, [id number]- Manual therapy, [id number]- Gait training, [phone]- Orthotic Initial, [phone]- Orthotic/Prosthetic subsequent, [phone]- Aquatic Therapy, [phone]- Electrical stimulation (manual), [id number]- Ultrasound, [phone] (1-2 muscles), [id number] (3+ muscles)- Dry Needling, Patient/Family education, Balance training, Stair training, Taping, Joint mobilization, Spinal mobilization, Scar mobilization, Compression bandaging, DME instructions, Cryotherapy, Moist heat, and Biofeedback  PLAN FOR NEXT SESSION: Check on HEP/update?.  LE strengthening; LE coordination;  balance; aerobic endurance; single leg balance.   Damien Caulk, PT 04/30/2024, 3:26 PM

## 2024-05-02 ENCOUNTER — Ambulatory Visit: Admitting: Neurology

## 2024-05-02 ENCOUNTER — Encounter: Payer: Self-pay | Admitting: Neurology

## 2024-05-02 VITALS — BP 138/78 | HR 84 | Ht 62.0 in | Wt 138.5 lb

## 2024-05-02 DIAGNOSIS — I63423 Cerebral infarction due to embolism of bilateral anterior cerebral arteries: Secondary | ICD-10-CM

## 2024-05-02 DIAGNOSIS — I639 Cerebral infarction, unspecified: Secondary | ICD-10-CM | POA: Diagnosis not present

## 2024-05-02 NOTE — Patient Instructions (Addendum)
 Continue current medications including Aspirin , Pradaxa  and Statin  Continue follow-up with cardiology You may resume driving Return as needed

## 2024-05-02 NOTE — Progress Notes (Signed)
 [facility]  PATIENT: Terri Alexander DOB: January 20, 1951  REQUESTING CLINICIAN: Remi Pippin, NP HISTORY FROM: Patient and husband REASON FOR VISIT: Frontal strokes   HISTORICAL  CHIEF COMPLAINT:  Chief Complaint  Patient presents with   Follow-up    Room 12 With husband CVA-Internal referral    HISTORY OF PRESENT ILLNESS:  This is a 73 year old woman past medical history atrial fibrillation on Pradaxa , hypertension, hyperlipidemia, PVD, aortic dissection who was admitted for TEVAR on November 3.  Following her procedure, she had episode of aphasia lasting a couple minutes.  MRI was completed showing bifrontal strokes, left occipital and left cerebellar strokes.  The stroke etiology was likely cardioembolic from stopping the Pradaxa  prior to the procedure or during the procedure itself.  Luckily patient does not have any deficit from the stroke.  She is back to her baseline, denies any focal weakness, no numbness, no aphasia or dysarthria.  No other complaints.  She wonders if she can resume driving.   Brief HPI and Summary:   Terri Alexander is a 73 y.o. female with PMHx atrial fibrillation (on Pradaxa ), HLD, PVD, and cataracts with known hx of type B aortic dissection with aneurysmal degeneration of the descending aorta who presented for scheduled TEVAR on 03/19/2024 by Dr. Lanis. The case was complicated by dissection of the left subclavian artery with need for stenting. Of the axillary and brachial arteries to reestablish flow to left upper extremity. The patient was recovering well and walking with PT until on 11/4 where she had an episode of transient aphasia that lasted a couple of minutes followed by another episode on 11/5 with unsteadiness while walking. Neurology was consulted and a MRI of the brain was ordered after recurrent episodes and small acute infarcts in the bilateral frontal lobes, left occipital lobe, and left cerebellum were noted. EF 60-65%,  hemoglobin A1c 5.1, LDL 71. Neurology recommended resuming simvastatin  as patient has tolerated well in the past. Continue Pradaxa  and aspirin  per vascular surgery. Prior to arrival the patient was independent in the community. Patient currently requires set-up min A for basic ADLs at RW level. Therapy evaluations completed due to patient decreased functional mobility was admitted for a comprehensive rehab program.    Inpatient Rehabilitation Course: Terri Alexander was admitted to rehab 03/23/2024 for inpatient therapies to consist of PT, ST and OT at least three hours five days a week. Past admission physiatrist, therapy team and rehab RN have worked together to provide customized collaborative inpatient rehab. Anticoagulation: Pradaxa  150 mg twice daily  OTHER MEDICAL CONDITIONS: Atrial fibrillation on Pradaxa , hypertension, hyperlipidemia DVT, peripheral vascular disease, type B aortic dissection   REVIEW OF SYSTEMS: Full 14 system review of systems performed and negative with exception of: As noted in the HPI  ALLERGIES: Allergies[1]  HOME MEDICATIONS: Outpatient Medications Prior to Visit  Medication Sig Dispense Refill   acetaminophen  (TYLENOL ) 325 MG tablet Take 1-2 tablets (325-650 mg total) by mouth every 4 (four) hours as needed for mild pain (pain score 1-3).     Ascorbic Acid (VITAMIN C) 1000 MG tablet Take 1,000 mg by mouth in the morning.     aspirin  EC 81 MG tablet Take 1 tablet (81 mg total) by mouth daily at 6 (six) AM. Swallow whole. 30 tablet 12   Biotin 1000 MCG tablet Take 1,000 mcg by mouth daily.     Calcium Carbonate-Vit D-Min (CALCIUM 1200 PO) Take 1 tablet by mouth in the morning.  Calcium Polycarbophil (FIBER-CAPS PO) Take 1 capsule by mouth in the morning.     cetirizine (ZYRTEC) 10 MG tablet Take 10 mg by mouth daily.     Cholecalciferol (VITAMIN D3) 125 MCG (5000 UT) TABS Take 5,000 Units by mouth in the morning.     Cranberry 500 MG CHEW Chew 500 mg by mouth  in the morning.     cyclobenzaprine  (FLEXERIL ) 10 MG tablet Take 1 tablet (10 mg total) by mouth 3 (three) times daily as needed for muscle spasms. 30 tablet 1   dabigatran  (PRADAXA ) 150 MG CAPS capsule Take 1 capsule (150 mg total) by mouth 2 (two) times daily. 180 capsule 3   docusate sodium  (COLACE) 100 MG capsule Take 100 mg by mouth in the morning.     famotidine  (PEPCID ) 10 MG tablet Take 1 tablet (10 mg total) by mouth 2 (two) times daily. 60 tablet 0   gabapentin  (NEURONTIN ) 100 MG capsule Take 1 capsule (100 mg total) by mouth 3 (three) times daily as needed (nerve pain). 90 capsule 3   L-Lysine 500 MG CAPS Take 500 mg by mouth in the morning.     Lidocaine  (SALONPAS PAIN RELIEVING EX) Apply 1 Application topically daily as needed (pain).     Menthol-Methyl Salicylate (SALONPAS PAIN RELIEF PATCH EX) Apply 1 patch topically daily as needed (pain).     Multiple Vitamin (MULTIVITAMIN WITH MINERALS) TABS tablet Take 1 tablet by mouth in the morning.     omeprazole  (PRILOSEC  OTC) 20 MG tablet Take 20 mg by mouth daily as needed (acid reflux).     oxyCODONE  (OXY IR/ROXICODONE ) 5 MG immediate release tablet Take 1 tablet (5 mg total) by mouth every 4 (four) hours as needed for moderate pain (pain score 4-6). 28 tablet 0   polyethylene glycol (MIRALAX  / GLYCOLAX ) 17 g packet Take 17 g by mouth daily.     Probiotic Product (PROBIOTIC PO) Take 1 tablet by mouth in the morning.     senna-docusate (SENOKOT-S) 8.6-50 MG tablet Take 1 tablet by mouth 2 (two) times daily. 60 tablet 0   simethicone  (MYLICON) 80 MG chewable tablet Chew 1 tablet (80 mg total) by mouth 4 (four) times daily as needed for flatulence.     simvastatin  (ZOCOR ) 10 MG tablet Take 1 tablet (10 mg total) by mouth at bedtime. 90 tablet 3   triamcinolone  cream (KENALOG ) 0.1 % Apply 1 Application topically 2 (two) times daily. 30 g 0   fosfomycin  (MONUROL ) 3 g PACK Take 3 g by mouth once.     fosfomycin  (MONUROL ) 3 g PACK Take 3 g by  mouth every three (3) days as needed. 6 g 0   No facility-administered medications prior to visit.    PAST MEDICAL HISTORY: Past Medical History:  Diagnosis Date   Adenomatous colon polyp    Allergy    Anemia    as a child, no current problem   Arrhythmia    Atrial fibrillation (HCC)    Blood transfusion without reported diagnosis @ 9 months   9 months old   Cataract    bilateral - MD just watching    Dysrhythmia    A. Fib   GERD (gastroesophageal reflux disease)    History of hiatal hernia    Hyperlipidemia    Osteoporosis 04/15/2016   Peripheral vascular disease    Type B Aortic Dissection   Skin cancer 2013   Skin cancer- Nose, face   Stroke (HCC)  PAST SURGICAL HISTORY: Past Surgical History:  Procedure Laterality Date   ANGIOPLASTY, ARTERY, SUBCLAVIAN, ENDOVASCULAR, WITH STENT INSERTION Left 03/19/2024   Procedure: LEFT AXILLARY ARTERY STENT INSERTION;  Surgeon: [name], MD;  Location: [facility] OR;  Service: Vascular;  Laterality: Left;   ATRIAL FIBRILLATION ABLATION N/A 02/16/2023   Procedure: ATRIAL FIBRILLATION ABLATION;  Surgeon: [name], MD;  Location: [facility] INVASIVE CV LAB;  Service: Cardiovascular;  Laterality: N/A;   CARDIOVERSION N/A 09/09/2022   Procedure: CARDIOVERSION;  Surgeon: [name], MD;  Location: [facility] INVASIVE CV LAB;  Service: Cardiovascular;  Laterality: N/A;   CESAREAN SECTION  1981   x 1   CESAREAN SECTION     COLONOSCOPY  2016   hx polyp, tics, sigmoid colon mod tortuous Dr [name],    EMBOLECTOMY Left 03/19/2024   Procedure: EMBOLECTOMY OF LEFT BRACHIAL ARTERY;  Surgeon: [name], MD;  Location: [facility] OR;  Service: Vascular;  Laterality: Left;   EYE SURGERY Bilateral    retina repair x 2   FEMORAL ARTERY EXPLORATION Left 03/19/2024   Procedure: EXPOSURE OF BRACHIAL ARTERY WITH STENT INSERTION;  Surgeon: [name], MD;  Location: [facility] OR;  Service: Vascular;  Laterality: Left;   LAPAROTOMY  1979   removal of  ovarian cyst   nissan fundiplication  05/17/1997   REPAIR OF COMPLEX TRACTION RETINAL DETACHMENT Bilateral 01/2019   SVT ABLATION N/A 12/09/2023   Procedure: SVT ABLATION;  Surgeon: [name], MD;  Location: [facility] INVASIVE CV LAB;  Service: Cardiovascular;  Laterality: N/A;   THORACIC AORTIC ENDOVASCULAR STENT GRAFT Left 03/19/2024   Procedure: INSERTION, ENDOVASCULAR STENT GRAFT, AORTA, THORACIC; STENTING X2 OF LEFT SUBCLAVIAN ARTERY;  Surgeon: [name], MD;  Location: [facility] OR;  Service: Vascular;  Laterality: Left;   ULTRASOUND GUIDANCE FOR VASCULAR ACCESS Bilateral 03/19/2024   Procedure: ULTRASOUND GUIDANCE, FOR VASCULAR ACCESS OF BILATERAL FEMORAL AND LEFT RADIAL ARTERY;  Surgeon: [name], MD;  Location: [facility] OR;  Service: Vascular;  Laterality: Bilateral;    FAMILY HISTORY: Family History  Problem Relation Age of Onset   Arthritis Mother    Heart disease Mother    Vision loss Mother    Stroke Mother    Arrhythmia Mother    Depression Sister    Transient ischemic attack Sister 74   Depression Sister    Cancer Maternal Grandmother    Cancer Maternal Grandfather    Early death Maternal Grandfather    Heart disease Maternal Grandfather    Early death Paternal Grandfather    Heart disease Paternal Grandfather    Diabetes Other        Juvenile   Arrhythmia Maternal Aunt    Colon cancer Neg Hx    Rectal cancer Neg Hx    Prostate cancer Neg Hx     SOCIAL HISTORY: Social History   Socioeconomic History   Marital status: Married    Spouse name: Not on file   Number of children: Not on file   Years of education: Not on file   Highest education level: 12th grade  Occupational History   Not on file  Tobacco Use   Smoking status: Never   Smokeless tobacco: Never   Tobacco comments:    Never smoked 12/16/23  Vaping Use   Vaping status: Never Used  Substance and Sexual Activity   Alcohol use: Yes    Alcohol/week: 1.0 standard drink of alcohol    Types: 1  Glasses of wine per week  Comment: rarely   Drug use: No   Sexual activity: Not Currently    Birth control/protection: Post-menopausal  Other Topics Concern   Not on file  Social History Narrative   Not on file   Social Drivers of Health   Tobacco Use: Low Risk (05/02/2024)   Patient History    Smoking Tobacco Use: Never    Smokeless Tobacco Use: Never    Passive Exposure: Not on file  Financial Resource Strain: Low Risk (11/14/2023)   Overall Financial Resource Strain (CARDIA)    Difficulty of Paying Living Expenses: Not hard at all  Food Insecurity: Unknown (04/13/2024)   Epic    Worried About Programme Researcher, Broadcasting/film/video in the Last Year: Never true    [employer] of Food in the Last Year: Not on file  Transportation Needs: No Transportation Needs (04/13/2024)   Epic    Lack of Transportation (Medical): No    Lack of Transportation (Non-Medical): No  Physical Activity: Insufficiently Active (04/13/2024)   Exercise Vital Sign    Days of Exercise per Week: 3 days    Minutes of Exercise per Session: 20 min  Stress: No Stress Concern Present (04/13/2024)   [employer] of Occupational Health - Occupational Stress Questionnaire    Feeling of Stress: Only a little  Social Connections: Unknown (04/13/2024)   Social Connection and Isolation Panel    Frequency of Communication with Friends and Family: More than three times a week    Frequency of Social Gatherings with Friends and Family: Three times a week    Attends Religious Services: 1 to 4 times per year    Active Member of Clubs or Organizations: Not on file    Attends Club or Organization Meetings: Not on file    Marital Status: Married  Intimate Partner Violence: Not At Risk (03/19/2024)   Epic    Fear of Current or Ex-Partner: No    Emotionally Abused: No    Physically Abused: No    Sexually Abused: No  Depression (PHQ2-9): Low Risk (04/02/2024)   Depression (PHQ2-9)    PHQ-2 Score: 0  Alcohol Screen: Low Risk  (04/13/2024)   Alcohol Screen    Last Alcohol Screening Score (AUDIT): 1  Housing: Unknown (04/13/2024)   Epic    Unable to Pay for Housing in the Last Year: Not on file    Number of Times Moved in the Last Year: 0    Homeless in the Last Year: No  Utilities: Not At Risk (03/19/2024)   Epic    Threatened with loss of utilities: No  Health Literacy: Adequate Health Literacy (06/30/2023)   B1300 Health Literacy    Frequency of need for help with medical instructions: Never    PHYSICAL EXAM  GENERAL EXAM/CONSTITUTIONAL: Vitals:  Vitals:   05/02/24 1410 05/02/24 1415  BP: (!) 148/75 138/78  Pulse: 84   SpO2: 98%   Weight: 138 lb 8 oz (62.8 kg)   Height: 5' 2 (1.575 m)    Body mass index is 25.33 kg/m. Wt Readings from Last 3 Encounters:  05/02/24 138 lb 8 oz (62.8 kg)  04/26/24 137 lb 3.2 oz (62.2 kg)  04/17/24 137 lb 3.2 oz (62.2 kg)   Patient is in no distress; well developed, nourished and groomed; neck is supple  MUSCULOSKELETAL: Gait, strength, tone, movements noted in Neurologic exam below  NEUROLOGIC: MENTAL STATUS:      No data to display         awake, alert, oriented  to person, place and time recent and remote memory intact normal attention and concentration language fluent, comprehension intact, naming intact fund of knowledge appropriate  CRANIAL NERVE:  2nd, 3rd, 4th, 6th - Visual fields full to confrontation, extraocular muscles intact, no nystagmus 5th - facial sensation symmetric 7th - facial strength symmetric 8th - hearing intact 9th - palate elevates symmetrically, uvula midline 11th - shoulder shrug symmetric 12th - tongue protrusion midline  MOTOR:  normal bulk and tone, full strength in the BUE, BLE  SENSORY:  normal and symmetric to light touch  COORDINATION:  finger-nose-finger, fine finger movements normal  GAIT/STATION:  normal   DIAGNOSTIC DATA (LABS, IMAGING, TESTING) - I reviewed patient records, labs, notes, testing  and imaging myself where available.  Lab Results  Component Value Date   WBC 7.7 04/12/2024   HGB 10.1 (L) 04/12/2024   HCT 32.0 (L) 04/12/2024   MCV 92.8 04/12/2024   PLT 406 (H) 04/12/2024      Component Value Date/Time   NA 139 04/12/2024 0827   NA 139 11/09/2023 0841   K 4.6 04/12/2024 0827   CL 104 04/12/2024 0827   CO2 25 04/12/2024 0827   GLUCOSE 105 (H) 04/12/2024 0827   BUN 19 04/12/2024 0827   BUN 21 11/09/2023 0841   CREATININE 0.58 04/12/2024 0827   CREATININE 0.68 04/02/2024 1047   CALCIUM 9.5 04/12/2024 0827   PROT 7.4 04/12/2024 0827   ALBUMIN  3.9 04/12/2024 0827   AST 32 04/12/2024 0827   ALT 31 04/12/2024 0827   ALKPHOS 102 04/12/2024 0827   BILITOT 0.4 04/12/2024 0827   GFRNONAA >60 04/12/2024 0827   GFRNONAA 88 09/18/2019 1039   GFRAA 102 09/18/2019 1039   Lab Results  Component Value Date   CHOL 141 03/22/2024   HDL 48 03/22/2024   LDLCALC 71 03/22/2024   TRIG 110 03/22/2024   CHOLHDL 2.9 03/22/2024   Lab Results  Component Value Date   HGBA1C 5.1 03/22/2024   No results found for: VITAMINB12 Lab Results  Component Value Date   TSH 2.41 08/09/2022    MRI Brain 03/22/2024 1. Small acute infarcts in the bilateral frontal lobes, left occipital lobe, and left cerebellum. Given involvement of multiple vascular territories, consider embolic etiology   ASSESSMENT AND PLAN  73 y.o. year old female with medical history including atrial fibrillation on Pradaxa , hypertension, hyperlipidemia DVT, peripheral vascular disease, type B aortic dissection who is presenting following strokes in bilateral frontal region, left occipital lobe, left cerebellum.  Stroke was in the setting of a cardiac procedure while patient stopped her Pradaxa  for a few days.  However she was on aspirin .  Stroke etiology likely cardioembolic from discontinuation of Pradaxa  versus the cardiac procedure itself.  Fortunately, no permanent deficit from a stroke.  Patient will  continue her current medications including Pradaxa , aspirin , and statin.  Her LDL was 71.  She will continue to follow with PCP, cardiology and return as needed.  She is comfortable with plans.  From a stroke she does not have any deficits, her visual field is full on confrontation, she may resume driving.   1. Cerebrovascular accident (CVA) due to bilateral embolism of anterior cerebral arteries (HCC)   2. Cardioembolic stroke [facility])      Patient Instructions  Continue current medications including Aspirin , Pradaxa  and Statin  Continue follow-up with cardiology You may resume driving Return as needed  No orders of the defined types were placed in this encounter.   No orders  of the defined types were placed in this encounter.   Return if symptoms worsen or fail to improve.    Pastor Falling, MD 05/02/2024, 2:50 PM  [facility] [address] [place], [place] [address] [phone]     [1]  Allergies Allergen Reactions   Sulfamethoxazole -Trimethoprim 

## 2024-05-04 ENCOUNTER — Encounter: Payer: Self-pay | Admitting: Physical Therapy

## 2024-05-04 ENCOUNTER — Ambulatory Visit: Admitting: Physical Therapy

## 2024-05-04 ENCOUNTER — Ambulatory Visit

## 2024-05-04 VITALS — BP 144/63 | HR 85

## 2024-05-04 DIAGNOSIS — M6281 Muscle weakness (generalized): Secondary | ICD-10-CM

## 2024-05-04 DIAGNOSIS — I6310 Cerebral infarction due to embolism of unspecified precerebral artery: Secondary | ICD-10-CM

## 2024-05-04 DIAGNOSIS — R2689 Other abnormalities of gait and mobility: Secondary | ICD-10-CM | POA: Diagnosis not present

## 2024-05-04 DIAGNOSIS — R278 Other lack of coordination: Secondary | ICD-10-CM

## 2024-05-04 NOTE — Therapy (Signed)
 " OUTPATIENT PHYSICAL THERAPY NEURO TREATMENT   Patient Name: Terri Alexander MRN: [id number] DOB:July 25, 1950, 73 y.o., female Today's Date: 05/04/2024   PCP: Terri Alexander DASEN, MD REFERRING PROVIDER: [name], NP  END OF SESSION:  PT End of Session - 05/04/24 1316     Visit Number 5    Number of Visits 17   16 visits plus eval   Date for Recertification  06/15/24    Authorization Type Medicare A&B/[employer]    Progress Note Due on Visit 10   Follow Medicare Guidelines   PT Start Time 1315    PT Stop Time 1358    PT Time Calculation (min) 43 min    Equipment Utilized During Treatment Gait belt    Activity Tolerance Patient tolerated treatment well    Behavior During Therapy WFL for tasks assessed/performed          Past Medical History:  Diagnosis Date   Adenomatous colon polyp    Allergy    Anemia    as a child, no current problem   Arrhythmia    Atrial fibrillation (HCC)    Blood transfusion without reported diagnosis @ 9 months   9 months old   Cataract    bilateral - MD just watching    Dysrhythmia    A. Fib   GERD (gastroesophageal reflux disease)    History of hiatal hernia    Hyperlipidemia    Osteoporosis 04/15/2016   Peripheral vascular disease    Type B Aortic Dissection   Skin cancer 2013   Skin cancer- Nose, face   Stroke [facility])    Past Surgical History:  Procedure Laterality Date   ANGIOPLASTY, ARTERY, SUBCLAVIAN, ENDOVASCULAR, WITH STENT INSERTION Left 03/19/2024   Procedure: LEFT AXILLARY ARTERY STENT INSERTION;  Surgeon: Terri Alexander BRAVO, MD;  Location: [facility] OR;  Service: Vascular;  Laterality: Left;   ATRIAL FIBRILLATION ABLATION N/A 02/16/2023   Procedure: ATRIAL FIBRILLATION ABLATION;  Surgeon: Terri Alexander BRAVO, MD;  Location: [facility] INVASIVE CV LAB;  Service: Cardiovascular;  Laterality: N/A;   CARDIOVERSION N/A 09/09/2022   Procedure: CARDIOVERSION;  Surgeon: Terri Alexander BRAVO, MD;  Location: [facility] INVASIVE CV LAB;  Service: Cardiovascular;   Laterality: N/A;   CESAREAN SECTION  1981   x 1   CESAREAN SECTION     COLONOSCOPY  2016   hx polyp, tics, sigmoid colon mod tortuous Dr [name],    EMBOLECTOMY Left 03/19/2024   Procedure: EMBOLECTOMY OF LEFT BRACHIAL ARTERY;  Surgeon: Terri Alexander BRAVO, MD;  Location: [facility] OR;  Service: Vascular;  Laterality: Left;   EYE SURGERY Bilateral    retina repair x 2   FEMORAL ARTERY EXPLORATION Left 03/19/2024   Procedure: EXPOSURE OF BRACHIAL ARTERY WITH STENT INSERTION;  Surgeon: Terri Alexander BRAVO, MD;  Location: [facility] OR;  Service: Vascular;  Laterality: Left;   LAPAROTOMY  1979   removal of ovarian cyst   nissan fundiplication  05/17/1997   REPAIR OF COMPLEX TRACTION RETINAL DETACHMENT Bilateral 01/2019   SVT ABLATION N/A 12/09/2023   Procedure: SVT ABLATION;  Surgeon: Terri Alexander BRAVO, MD;  Location: [facility] INVASIVE CV LAB;  Service: Cardiovascular;  Laterality: N/A;   THORACIC AORTIC ENDOVASCULAR STENT GRAFT Left 03/19/2024   Procedure: INSERTION, ENDOVASCULAR STENT GRAFT, AORTA, THORACIC; STENTING X2 OF LEFT SUBCLAVIAN ARTERY;  Surgeon: Terri Alexander BRAVO, MD;  Location: [facility] OR;  Service: Vascular;  Laterality: Left;   ULTRASOUND GUIDANCE FOR VASCULAR ACCESS Bilateral 03/19/2024   Procedure: ULTRASOUND GUIDANCE, FOR VASCULAR ACCESS  OF BILATERAL FEMORAL AND LEFT RADIAL ARTERY;  Surgeon: Terri Alexander BRAVO, MD;  Location: [facility] OR;  Service: Vascular;  Laterality: Bilateral;   Patient Active Problem List   Diagnosis Date Noted   CVA (cerebral vascular accident) (HCC) 03/23/2024   S/P AAA (abdominal aortic aneurysm) repair 03/19/2024   Thoracic aortic aneurysm, without rupture, unspecified 03/19/2024   Persistent atrial fibrillation (HCC) 08/25/2022   Hypercoagulable state due to persistent atrial fibrillation (HCC) 08/25/2022   Atrial fibrillation with RVR (HCC) 08/18/2022   Aortic dissection (HCC) 08/17/2022   Aortic dissection distal to left subclavian (HCC) 08/17/2022   Diverticulosis 04/19/2022    Diverticulosis of sigmoid colon 04/19/2022   Palpitations 06/24/2021   Premature atrial contraction 06/24/2021   PVC (premature ventricular contraction) 06/24/2021   Abnormal EKG 06/24/2021   Dyslipidemia 03/24/2018   Postmenopausal estrogen deficiency 03/24/2018   Squamous cell carcinoma 08/10/2017   HSV-1 (herpes simplex virus 1) infection 03/16/2017   Osteoporosis 04/15/2016   Vulvar atrophy 11/07/2014   Abnormal Pap smear of cervix 11/07/2014    ONSET DATE: 03/28/2024 (date of referral)  REFERRING DIAG: I63.10 (ICD-10-CM) - Cerebrovascular accident (CVA) due to embolism of precerebral artery (HCC)  THERAPY DIAG:  Other abnormalities of gait and mobility  Muscle weakness (generalized)  Other lack of coordination  Rationale for Evaluation and Treatment: Rehabilitation  SUBJECTIVE: Likes to be called Terri Alexander.                                                                                                                                                                                           SUBJECTIVE STATEMENT: Pt reports getting clearance to drive (drove self to appt today).  No acute changes.  Did ex's this morning (back wasn't bothering her) but mid-day back starting hurting again.  No recent falls.  Plan for OT eval today. Pt accompanied by: self (pt drove self)  PERTINENT HISTORY: Multifocal CVA's s/p TEVAR with subclavian branch endoprosthesis 03/19/24 (LUE and B groin incisions) to repair thoracic/abdominal aortic aneurysm (chronic type B aortic dissection with aneurysmal degeneration of the descending aorta).  Case complicated by dissection of the left subclavian artery with need for stenting of the axillary and brachial arteries to reestablish flow to left upper extremity. Pt with episode of transient aphasia that lasted a couple of minutes 03/20/24; episode of unsteadiness while walking 03/21/24.  03/21/24 imaging: Small acute infarcts: B frontal lobes, L occipital lobe, and L  cerebellum.  Inpatient rehab stay 03/23/24-03/30/24.  PMH also includes anemia, a-fib, arrhythmia, h/o hiatal hernia, PVD (type B aortic dissection), skin CA (nose, face).  PAIN: 2-3/10 R LBP Are you having pain?  Yes: NPRS scale: 0/10 Pain location: B shoulders Pain description: muscle spasms R side Aggravating factors: unknown Relieving factors: heat; salon pass (patches)  PRECAUTIONS: Fall; no lifting anything heavy with L UE (less than gallon of milk) per pt  RED FLAGS: None   WEIGHT BEARING RESTRICTIONS: No  FALLS: Has patient fallen in last 6 months? No  LIVING ENVIRONMENT: Lives with: lives with their family (husband) Lives in: House/apartment (townhouse; 1 level) Stairs: Yes: External: 1 steps; none Has following equipment at home: Single point cane, shower chair, and Grab bars  PLOF: Independent prior to surgery.  CGA/SBA ambulation without AD in IP rehab.  PATIENT GOALS: Get strength back.  Improve balance.  OBJECTIVE:  Note: Objective measures were completed at Evaluation unless otherwise noted.  DIAGNOSTIC FINDINGS:  Per MRI Brain 03/21/24: 1. Small acute infarcts in the bilateral frontal lobes, left occipital lobe, and left cerebellum. Given involvement of multiple vascular territories, consider embolic etiology.  COGNITION: Overall cognitive status: Within functional limits for tasks assessed   SENSATION: WFL (light touch)  COORDINATION: Good B heel to shin coordination in sitting.  Difficulty with rapid alternating toe taps in sitting B.  EDEMA:  General LE swelling (has had since surgery)  MUSCLE TONE: WNL  POSTURE: rounded shoulders and forward head  LOWER EXTREMITY MMT:    MMT Right Eval Left Eval  Hip flexion 4/5 4/5  Hip extension    Hip abduction    Hip adduction    Hip internal rotation    Hip external rotation    Knee flexion 4/5 4/5  Knee extension 4+/5 4+/5  Ankle dorsiflexion 4+/5 4+/5  Ankle plantarflexion At least 3/5 AROM  At least 3/5 AROM  Ankle inversion    Ankle eversion    (Blank rows = not tested)  BED MOBILITY:  Independent  TRANSFERS: Sit to stand: Modified independence  Assistive device utilized: None     Stand to sit: Modified independence  Assistive device utilized: None      GAIT: Findings: Gait Characteristics: step through gait pattern, Distance walked: clinic distances, Assistive device utilized:None, Level of assistance: SBA, and Comments: no loss of balance ambulating without AD use; pt appearing fatigued though after  FUNCTIONAL TESTS:  5 times sit to stand: 12.09 seconds 10 meter walk test: 0.87 m/sec (11.38 seconds); HR 104 bpm post activity; 1.04 m/sec (9.59 seconds) 04/30/24 SLS: R LE 5.10 seconds; L LE unable to perform without assist for balance (52/56 on BERG 03/29/24 in inpatient rehab) FGA: 18/30 (no AD use) 04/16/24 : 1096 feet (no AD use) 04/16/24  FUNCTIONAL GAIT ASSESSMENT  Date: 04/16/24 Score  GAIT LEVEL SURFACE Instructions: Walk at your normal speed from here to the next mark (6 m) [20 ft]. (2) Mild impairment - Walks 6 m (20 ft) in less than 7 seconds but greater than 5.5 seconds, uses assistive device, slower speed, mild gait deviations, or deviates 15.24 -25.4 cm (6 -10 in) outside of the 30.48-cm (12-in) walkway width. 6.09 seconds  2.   CHANGE IN GAIT SPEED Instructions: Begin walking at your normal pace (for 1.5 m [5 ft]). When I tell you go, walk as fast as you can (for 1.5 m [5 ft]). When I tell you slow, walk as slowly as you can (for 1.5 m [5 ft]. (2) Mild impairment - Is able to change speed but demonstrates mild gait deviations, deviates 15.24 -25.4 cm (6 -10 in) outside of the 30.48-cm (12-in) walkway width, or no gait deviations but unable  to achieve a significant change in velocity, or uses an assistive device  3.    GAIT WITH HORIZONTAL HEAD TURNS Instructions: Walk from here to the next mark 6 m (20 ft) away. Begin walking at your normal pace.  Keep walking straight; after 3 steps, turn your head to the right and keep walking straight while looking to the right. After 3 more steps, turn your head to the left and keep walking straight while looking left. Continue alternating looking right and left. (3) Normal - Performs head turns smoothly with no change in gait. Deviates no more than 15.24 cm (6 in) outside 30.48-cm (12-in) walkway width.  4.   GAIT WITH VERTICAL HEAD TURNS Instructions: Walk from here to the next mark (6 m [20 ft]). Begin walking at your normal pace. Keep walking straight; after 3 steps, tip your head up and keep walking straight while looking up. After 3 more steps, tip your head down, keep walking straight while looking down. Continue  alternating looking up and down every 3 steps until you have completed 2 repetitions in each direction. (2) Mild impairment - Performs task with slight change in gait velocity (eg, minor disruption to smooth gait path), deviates 15.24 -25.4 cm (6 -10 in) outside 30.48-cm (12-in) walkway width or uses assistive device.  5.  GAIT AND PIVOT TURN Instructions: Begin with walking at your normal pace. When I tell you, turn and stop, turn as quickly as you can to face the opposite direction and stop. (3) Normal - Pivot turns safely within 3 seconds and stops quickly with no loss of balance  6.   STEP OVER OBSTACLE Instructions: Begin walking at your normal speed. When you come to the shoe box, step over it, not around it, and keep walking. (2) Mild impairment - Is able to step over one shoe box (11.43 cm [4.5 in] total height) without changing gait speed; no evidence of imbalance.  7.   GAIT WITH NARROW BASE OF SUPPORT Instructions: Walk on the floor with arms folded across the chest, feet aligned heel to toe in tandem for a distance of 3.6 m [12 ft]. The number of steps taken in a straight line are counted for a maximum of 10 steps. (0) Severe impairment - Ambulates less than 4 steps heel to toe or  cannot perform without assistance.  8.   GAIT WITH EYES CLOSED Instructions: Walk at your normal speed from here to the next mark (6 m [20 ft]) with your eyes closed. (0) Severe impairment - Cannot walk 6 m (20 ft) without assistance, severe gait deviations or imbalance, deviates greater than 38.1 cm (15 in) outside 30.48-cm (12-in) walkway width or will not attempt task. 8.28 seconds with significant R veer.  9.   AMBULATING BACKWARDS Instructions: Walk backwards until I tell you to stop (2) Mild impairment - Walks 6 m (20 ft), uses assistive device, slower speed, mild gait deviations, deviates 15.24 -25.4 cm (6 -10 in) outside 30.48-cm (12-in) walkway width. 12.68 seconds  10. STEPS Instructions: Walk up these stairs as you would at home (ie, using the rail if necessary). At the top turn around and walk down. (2) Mild impairment-Alternating feet, must use rail.  Total 18/30   Interpretation of scores: Non-Specific Older Adults Cutoff Score: <=22/30 = risk of falls Parkinsons Disease Cutoff score <15/30= fall risk (Hoehn & Yahr 1-4)  Minimally Clinically Important Difference (MCID)  Stroke (acute, subacute, and chronic) = MDC: 4.2 points Vestibular (acute) = MDC: 6  points [employer] Older Adults =  MCID: 4 points Parkinsons Disease  =  MDC: 4.3 points  (Academy of Neurologic Physical Therapy (nd). Functional Gait Assessment. Retrieved from https://www.neuropt.org/docs/default-source/cpgs/core-outcome-measures/function-gait-assessment-pocket-guide-proof9-(2).pdf?sfvrsn=b82f35043_0.)   PATIENT SURVEYS:  TBA                                                                                                                              TREATMENT DATE: 05/04/24  Self Care: BP and HR taken in sitting at rest beginning of session (see below for details). Vitals:   05/04/24 1320  BP: (!) 144/63  Pulse: 85    Therapeutic Exercise: SciFit multi-peaks up to level 3 for 8 minutes using  B LEs for LE strengthening, dynamic cardiovascular warmup and increased amplitude of stepping. RPE of 5/10 following activity.  Average stride length 11.2 inches.  Mild L knee pain reported but pt reported no need to stop exercise.  Terminal hip flexor strengthening (foot starting on 6 inch step): x10 reps x3 sets with B UE support  Therapeutic Activity: Balance in // bars on 6 foot foam beam: Side stepping (L then R =1 rep): x3 reps (no UE support) Notes: CGA; pt with occasional LOB but able to self correct (occasional use of // bar for support) Tandem walking (heel to toe): x4 trials forwards Notes: CGA to min assist for balance Standing on Airex: Toe taps to 6 inch cones: x10 reps x3 sets B LE's Notes: CGA progressing to CGA to min assist for balance with fatigue; HR 80 bpm post activity   PATIENT EDUCATION: Education details: Continue HEP. Person educated: Patient Education method: Explanation and Verbal cues Education comprehension: verbalized understanding, verbal cues required, and needs further education  HOME EXERCISE PROGRAM: Access Code: [email or web address] URL: [email or web address] Date: 04/27/2024 Prepared by: Damien Caulk  Exercises - Standing Tandem Balance with Counter Support  - 1 x daily - 5 x weekly - 2-3 sets - 10 reps - Mini Squat with Counter Support  - 1 x daily - 5 x weekly - 2-3 sets - 10 reps - Heel Raises with Counter Support  - 1 x daily - 5 x weekly - 2-3 sets - 10 reps - Seated Hip Abduction/External Rotation With Resistance at Thighs  - 1 x daily - 5 x weekly - 2-3 sets - 10 reps  GOALS: Goals reviewed with patient? Yes  SHORT TERM GOALS: Target date: 05/02/2024  Pt will be independent with initial HEP in order to improve strength and balance in order to decrease fall risk and improve function at home for ADL's. Baseline:  Issued Goal status: MET  2.  Assess FGA and update LTG as needed. Baseline: 18/30 04/16/24 Goal status: MET  3.   Assess and update LTG as needed. Baseline: 1096 feet on 04/16/24 Goal status: MET  4.  Pt will increase by at least 0.13 m/s in order to demonstrate clinically significant improvement in community ambulation.  Baseline: 0.87 m/sec; 1.04  m/sec 04/30/24 Goal status: MET  LONG TERM GOALS: Target date: 05/30/2024  Pt will be independent with final HEP in order to improve strength and balance in order to decrease fall risk and improve function at home for ADL's. Baseline:  Goal status: INITIAL  2.  Patient will increase Functional Gait Assessment score to > or equal to 22/30 as to reduce fall risk and improve dynamic gait safety with community ambulation. Baseline: 18/30 04/16/24 Goal status: INITIAL  3.  Patient will tolerate 10 seconds of single leg stance B LE's without loss of balance to improve ability to get in and out of shower safely. Baseline: R LE 5.10 seconds; L LE unable to perform without assist for balance Goal status: INITIAL  4.  Pt will increase by at least 58m (19ft) in order to demonstrate clinically significant improvement in cardiopulmonary endurance and community ambulation. Baseline: 1096 feet on 04/16/24 Goal status: INITIAL   ASSESSMENT:  CLINICAL IMPRESSION: Patient was seen today for physical therapy treatment to address strength and balance.  Focused session on LE strengthening (including terminal hip flexor strengthening to improve foot clearance for ambulation) and balance on compliant surface.  Patient continues to be limited by strength and balance.  They demonstrate improvement in balance reaction responses with repetition of activities.  They would continue to benefit from skilled PT to address impairments as noted and progress towards long term goals.  OBJECTIVE IMPAIRMENTS: Abnormal gait, cardiopulmonary status limiting activity, decreased activity tolerance, decreased balance, decreased coordination, decreased endurance, decreased knowledge  of condition, decreased knowledge of use of DME, decreased mobility, difficulty walking, decreased strength, improper body mechanics, postural dysfunction, and pain.   ACTIVITY LIMITATIONS: carrying, lifting, bending, squatting, stairs, bathing, toileting, dressing, and locomotion level  PARTICIPATION LIMITATIONS: meal prep, cleaning, laundry, driving, shopping, community activity, and yard work  PERSONAL FACTORS: Age, Past/current experiences, Transportation, and 1-2 comorbidities: a-fib and PVD are also affecting patient's functional outcome.   REHAB POTENTIAL: Good  CLINICAL DECISION MAKING: Evolving/moderate complexity  EVALUATION COMPLEXITY: Moderate  PLAN:  PT FREQUENCY: 2x/week  PT DURATION: 8 weeks  PLANNED INTERVENTIONS: [id number]- PT Re-evaluation, 97750- Physical Performance Testing, 97110-Therapeutic exercises, 97530- Therapeutic activity, [id number]- Neuromuscular re-education, 97535- Self Care, [id number]- Manual therapy, [id number]- Gait training, [phone]- Orthotic Initial, [phone]- Orthotic/Prosthetic subsequent, [phone]- Aquatic Therapy, [phone]- Electrical stimulation (manual), [id number]- Ultrasound, [phone] (1-2 muscles), [id number] (3+ muscles)- Dry Needling, Patient/Family education, Balance training, Stair training, Taping, Joint mobilization, Spinal mobilization, Scar mobilization, Compression bandaging, DME instructions, Cryotherapy, Moist heat, and Biofeedback  PLAN FOR NEXT SESSION: Check on HEP/update?  LE strengthening; LE coordination; balance; aerobic endurance; single leg balance.   Damien Caulk, PT 05/04/2024, 2:06 PM        "

## 2024-05-04 NOTE — Therapy (Signed)
 " OUTPATIENT OCCUPATIONAL THERAPY NEURO EVALUATION  Patient Name: Terri Alexander MRN: [id number] DOB:[birth year], 73 y.o., female Today's Date: 05/04/2024  PCP: [name], MD REFERRING PROVIDER: [name], NP  END OF SESSION:  OT End of Session - 05/04/24 1522     Visit Number 1    Number of Visits 1    Authorization Type MCR    OT Start Time 1445    OT Stop Time 1515    OT Time Calculation (min) 30 min    Activity Tolerance Patient tolerated treatment well    Behavior During Therapy WFL for tasks assessed/performed          Past Medical History:  Diagnosis Date   Adenomatous colon polyp    Allergy    Anemia    as a child, no current problem   Arrhythmia    Atrial fibrillation (HCC)    Blood transfusion without reported diagnosis @ 9 months   9 months old   Cataract    bilateral - MD just watching    Dysrhythmia    A. Fib   GERD (gastroesophageal reflux disease)    History of hiatal hernia    Hyperlipidemia    Osteoporosis 04/15/2016   Peripheral vascular disease    Type B Aortic Dissection   Skin cancer 2013   Skin cancer- Nose, face   Stroke [facility])    Past Surgical History:  Procedure Laterality Date   ANGIOPLASTY, ARTERY, SUBCLAVIAN, ENDOVASCULAR, WITH STENT INSERTION Left 03/19/2024   Procedure: LEFT AXILLARY ARTERY STENT INSERTION;  Surgeon: [name], MD;  Location: [facility] OR;  Service: Vascular;  Laterality: Left;   ATRIAL FIBRILLATION ABLATION N/A 02/16/2023   Procedure: ATRIAL FIBRILLATION ABLATION;  Surgeon: [name], MD;  Location: [facility] INVASIVE CV LAB;  Service: Cardiovascular;  Laterality: N/A;   CARDIOVERSION N/A 09/09/2022   Procedure: CARDIOVERSION;  Surgeon: [name], MD;  Location: [facility] INVAS[facility] CV [facility];  Service: Cardiovascular;  Laterality: N/A;   CESAREAN SECTION  1981   x 1   CESAREAN SECTION     COLONOSCOPY  2016   hx polyp, tics, sigmoid colon mod tortuous Dr [name],    EMBOLECTOMY Left 03/19/2024    Procedure: EMBOLECTOMY OF LEFT BRACHIAL ARTERY;  Surgeon: [name], MD;  Location: [facility] OR;  Service: Vascular;  Laterality: Left;   EYE SURGERY Bilateral    retina repair x 2   FEMORAL ARTERY EXPLORATION Left 03/19/2024   Procedure: EXPOSURE OF BRACHIAL ARTERY WITH STENT INSERTION;  Surgeon: [name], MD;  Location: [facility] OR;  Service: Vascular;  Laterality: Left;   LAPAROTOMY  1979   removal of ovarian cyst   nissan fundiplication  05/17/1997   REPAIR OF COMPLEX TRACTION RETINAL DETACHMENT Bilateral 01/2019   SVT ABLATION N/A 12/09/2023   Procedure: SVT ABLATION;  Surgeon: [name], MD;  Location: [facility] CV [facility];  Service: Cardiovascular;  Laterality: N/A;   THORACIC AORTIC ENDOVASCULAR STENT GRAFT Left 03/19/2024   Procedure: INSERTION, ENDOVASCULAR STENT GRAFT, AORTA, THORACIC; STENTING X2 OF LEFT SUBCLAVIAN ARTERY;  Surgeon: [name], MD;  Location: [facility] OR;  Service: Vascular;  Laterality: Left;   ULTRASOUND GUIDANCE FOR VASCULAR ACCESS Bilateral 03/19/2024   Procedure: ULTRASOUND GUIDANCE, FOR VASCULAR ACCESS OF BILATERAL FEMORAL AND LEFT RADIAL ARTERY;  Surgeon: [name], MD;  Location: [facility] OR;  Service: Vascular;  Laterality: Bilateral;   Patient Active Problem List   Diagnosis Date Noted   CVA (cerebral  vascular accident) (HCC) 03/23/2024   S/P AAA (abdominal aortic aneurysm) repair 03/19/2024   Thoracic aortic aneurysm, without rupture, unspecified 03/19/2024   Persistent atrial fibrillation (HCC) 08/25/2022   Hypercoagulable state due to persistent atrial fibrillation (HCC) 08/25/2022   Atrial fibrillation with RVR (HCC) 08/18/2022   Aortic dissection (HCC) 08/17/2022   Aortic dissection distal to left subclavian (HCC) 08/17/2022   Diverticulosis 04/19/2022   Diverticulosis of sigmoid colon 04/19/2022   Palpitations 06/24/2021   Premature atrial contraction 06/24/2021   PVC (premature ventricular contraction) 06/24/2021   Abnormal EKG  06/24/2021   Dyslipidemia 03/24/2018   Postmenopausal estrogen deficiency 03/24/2018   Squamous cell carcinoma 08/10/2017   HSV-1 (herpes simplex virus 1) infection 03/16/2017   Osteoporosis 04/15/2016   Vulvar atrophy 11/07/2014   Abnormal Pap smear of cervix 11/07/2014    ONSET DATE: 03/28/2024 referral date  REFERRING DIAG: I63.10 (ICD-10-CM) - Cerebrovascular accident (CVA) due to embolism of precerebral artery (HCC)  THERAPY DIAG:  Cerebrovascular accident (CVA) due to embolism of precerebral artery (HCC) - Plan: Ot plan of care cert/re-cert  Rationale for Evaluation and Treatment: Rehabilitation  SUBJECTIVE:   SUBJECTIVE STATEMENT: Pt reports that she is doing well, states that she's returned to driving and is loving it. Pt accompanied by: self  PERTINENT HISTORY:  Per MRI Brain 03/21/24: 1. Small acute infarcts in the bilateral frontal lobes, left occipital lobe, and left cerebellum. Given involvement of multiple vascular territories, consider embolic etiology. Multifocal CVA's s/p TEVAR with subclavian branch endoprosthesis 03/19/24 (LUE and B groin incisions) to repair thoracic/abdominal aortic aneurysm (chronic type B aortic dissection with aneurysmal degeneration of the descending aorta).  Case complicated by dissection of the left subclavian artery with need for stenting of the axillary and brachial arteries to reestablish flow to left upper extremity. Pt with episode of transient aphasia that lasted a couple of minutes 03/20/24; episode of unsteadiness while walking 03/21/24.  03/21/24 imaging: Small acute infarcts: B frontal lobes, L occipital lobe, and L cerebellum.  Inpatient rehab stay 03/23/24-03/30/24.  PMH also includes anemia, a-fib, arrhythmia, h/o hiatal hernia, PVD (type B aortic dissection), skin CA (nose, face).   PRECAUTIONS: Fall  WEIGHT BEARING RESTRICTIONS: no lifting anything heavy with LUE (no heavier than a gallon of milk per pt at time of PT  eval)  PAIN:  Are you having pain? No  FALLS: Has patient fallen in last 6 months? No  LIVING ENVIRONMENT: Lives with: lives with their spouse Lives in: House/apartment townhouse, 1 level Stairs: Yes: External: 1 steps; none Has following equipment at home: Single point cane, shower chair, and Grab bars  PLOF: Independent prior to surgery, CGA/SCA ambulation without AD in IP rehab  PATIENT GOALS: Pt reports she feels she does not need skilled OT at this time, but agreed to proceed with eval.  OBJECTIVE:  Note: Objective measures were completed at Evaluation unless otherwise noted.  HAND DOMINANCE: Right  ADLs: Overall ADLs: Independent/Mod I Transfers/ambulation related to ADLs: Independent/Mod I Eating: Independent Grooming: Independent UB Dressing: Independent LB Dressing: Independent Toileting: Independent Bathing: Independent Tub Shower transfers: Independent Equipment: Shower seat without back  IADLs:  Shopping: Independent Light housekeeping: Independent Meal Prep: Independent Community mobility: Independent Medication management: Independent Financial management: Independent Handwriting: NT, pt did not report any concern  MOBILITY STATUS: Independent  POSTURE COMMENTS:  No Significant postural limitations Sitting balance: WFL  ACTIVITY TOLERANCE: Activity tolerance: Pt reports I can get a little bit winded.   FUNCTIONAL OUTCOME MEASURES: Upper Extremity Functional  Scale (UEFS): 79/80  UPPER EXTREMITY ROM:    Active ROM Right eval Left eval  Shoulder flexion    Shoulder abduction    Shoulder adduction    Shoulder extension    Shoulder internal rotation    Shoulder external rotation    Elbow flexion    Elbow extension    Wrist flexion    Wrist extension    Wrist ulnar deviation    Wrist radial deviation    Wrist pronation    Wrist supination    (Blank rows = not tested)  UPPER EXTREMITY MMT:     MMT Right eval Left eval  Shoulder  flexion    Shoulder abduction    Shoulder adduction    Shoulder extension    Shoulder internal rotation    Shoulder external rotation    Middle trapezius    Lower trapezius    Elbow flexion    Elbow extension    Wrist flexion    Wrist extension    Wrist ulnar deviation    Wrist radial deviation    Wrist pronation    Wrist supination    (Blank rows = not tested)  HAND FUNCTION: Grip strength: Right: 45.9 average lbs; Left: 41 average lbs  COORDINATION: 9 Hole Peg test: Right: 27.37 sec; Left: 23.60 sec Box and Blocks:  Right 53blocks, Left 52blocks  SENSATION: WFL  EDEMA: none  MUSCLE TONE: RUE: Within functional limits and LUE: Within functional limits  COGNITION: Overall cognitive status: Within functional limits for tasks assessed  VISION: Subjective report: Pt reports no concerns with vision since hospitalization. Reports she wears one contact lens. Baseline vision: Wears contacts Visual history: none  VISION ASSESSMENT: Not tested  Patient has difficulty with following activities due to following visual impairments: none  PERCEPTION: WFL  PRAXIS: WFL  OBSERVATIONS: Pt is at [facility], does not require skilled services at this time.                                                                                                                             TREATMENT DATE: 05/04/24   Pt educated in purpose of OT and purposes of tests. Briefly discussed energy conservation techniques. D/t pt returning to PLOF pt at this time does not require further skilled OT services. Educated pt in how to obtain referral should she require us  in the future.      PATIENT EDUCATION: Education details: SEE ABOVE Person educated: Patient Education method: Explanation Education comprehension: verbalized understanding  GOALS:   LONG TERM GOALS: Target date: 05/16/24  Pt will verbalize understanding of energy conservation techniques to carry over with functional activity  tolerance. Baseline: MET Goal status: INITIAL    ASSESSMENT:  CLINICAL IMPRESSION: Patient is a 73 y.o. female who was seen today for occupational therapy evaluation for s/p CVA. Patient needs addressed at eval. No further need for skilled OT services at this time.    CO-MORBIDITIES: may have co-morbidities  that  affects occupational performance. Patient will benefit from skilled OT to address above impairments and improve overall function.  MODIFICATION OR ASSISTANCE TO COMPLETE EVALUATION: No modification of tasks or assist necessary to complete an evaluation.  OT OCCUPATIONAL PROFILE AND HISTORY: Problem focused assessment: Including review of records relating to presenting problem.  CLINICAL DECISION MAKING: LOW - limited treatment options, no task modification necessary  REHAB POTENTIAL: Excellent  EVALUATION COMPLEXITY: Low    PLAN: Eval only   [employer], OT 05/04/2024, 3:24 PM           "

## 2024-05-07 ENCOUNTER — Ambulatory Visit: Admitting: Physical Therapy

## 2024-05-07 ENCOUNTER — Encounter: Payer: Self-pay | Admitting: Physical Therapy

## 2024-05-07 VITALS — BP 152/66 | HR 76

## 2024-05-07 DIAGNOSIS — M6281 Muscle weakness (generalized): Secondary | ICD-10-CM

## 2024-05-07 DIAGNOSIS — R278 Other lack of coordination: Secondary | ICD-10-CM

## 2024-05-07 DIAGNOSIS — R2689 Other abnormalities of gait and mobility: Secondary | ICD-10-CM

## 2024-05-07 NOTE — Therapy (Signed)
 " OUTPATIENT PHYSICAL THERAPY NEURO TREATMENT   Patient Name: Terri Alexander MRN: [id number] DOB:08-04-1950, 73 y.o., female Today's Date: 05/07/2024   PCP: [name], MD REFERRING PROVIDER: [name], NP  END OF SESSION:  PT End of Session - 05/07/24 1231     Visit Number 6    Number of Visits 17   16 visits plus eval   Date for Recertification  06/15/24    Authorization Type Medicare A&B/[employer]    Progress Note Due on Visit 10   Follow Medicare Guidelines   PT Start Time 1230    PT Stop Time 1313    PT Time Calculation (min) 43 min    Equipment Utilized During Treatment Gait belt    Activity Tolerance Patient tolerated treatment well    Behavior During Therapy WFL for tasks assessed/performed          Past Medical History:  Diagnosis Date   Adenomatous colon polyp    Allergy    Anemia    as a child, no current problem   Arrhythmia    Atrial fibrillation (HCC)    Blood transfusion without reported diagnosis @ 9 months   9 months old   Cataract    bilateral - MD just watching    Dysrhythmia    A. Fib   GERD (gastroesophageal reflux disease)    History of hiatal hernia    Hyperlipidemia    Osteoporosis 04/15/2016   Peripheral vascular disease    Type B Aortic Dissection   Skin cancer 2013   Skin cancer- Nose, face   Stroke [facility])    Past Surgical History:  Procedure Laterality Date   ANGIOPLASTY, ARTERY, SUBCLAVIAN, ENDOVASCULAR, WITH STENT INSERTION Left 03/19/2024   Procedure: LEFT AXILLARY ARTERY STENT INSERTION;  Surgeon: [name], MD;  Location: [facility] OR;  Service: Vascular;  Laterality: Left;   ATRIAL FIBRILLATION ABLATION N/A 02/16/2023   Procedure: ATRIAL FIBRILLATION ABLATION;  Surgeon: [name], MD;  Location: [facility] INVASIVE CV LAB;  Service: Cardiovascular;  Laterality: N/A;   CARDIOVERSION N/A 09/09/2022   Procedure: CARDIOVERSION;  Surgeon: [name], MD;  Location: [facility] INVASIVE CV LAB;  Service: Cardiovascular;   Laterality: N/A;   CESAREAN SECTION  1981   x 1   CESAREAN SECTION     COLONOSCOPY  2016   hx polyp, tics, sigmoid colon mod tortuous Dr [name],    EMBOLECTOMY Left 03/19/2024   Procedure: EMBOLECTOMY OF LEFT BRACHIAL ARTERY;  Surgeon: [name], MD;  Location: [facility] OR;  Service: Vascular;  Laterality: Left;   EYE SURGERY Bilateral    retina repair x 2   FEMORAL ARTERY EXPLORATION Left 03/19/2024   Procedure: EXPOSURE OF BRACHIAL ARTERY WITH STENT INSERTION;  Surgeon: [name], MD;  Location: [facility] OR;  Service: Vascular;  Laterality: Left;   LAPAROTOMY  1979   removal of ovarian cyst   nissan fundiplication  05/17/1997   REPAIR OF COMPLEX TRACTION RETINAL DETACHMENT Bilateral 01/2019   SVT ABLATION N/A 12/09/2023   Procedure: SVT ABLATION;  Surgeon: [name], MD;  Location: [facility] INVASIVE CV LAB;  Service: Cardiovascular;  Laterality: N/A;   THORACIC AORTIC ENDOVASCULAR STENT GRAFT Left 03/19/2024   Procedure: INSERTION, ENDOVASCULAR STENT GRAFT, AORTA, THORACIC; STENTING X2 OF LEFT SUBCLAVIAN ARTERY;  Surgeon: [name], MD;  Location: [facility] OR;  Service: Vascular;  Laterality: Left;   ULTRASOUND GUIDANCE FOR VASCULAR ACCESS Bilateral 03/19/2024   Procedure: ULTRASOUND GUIDANCE, FOR VASCULAR ACCESS  OF BILATERAL FEMORAL AND LEFT RADIAL ARTERY;  Surgeon: [name], MD;  Location: [facility] OR;  Service: Vascular;  Laterality: Bilateral;   Patient Active Problem List   Diagnosis Date Noted   CVA (cerebral vascular accident) (HCC) 03/23/2024   S/P AAA (abdominal aortic aneurysm) repair 03/19/2024   Thoracic aortic aneurysm, without rupture, unspecified 03/19/2024   Persistent atrial fibrillation (HCC) 08/25/2022   Hypercoagulable state due to persistent atrial fibrillation (HCC) 08/25/2022   Atrial fibrillation with RVR (HCC) 08/18/2022   Aortic dissection (HCC) 08/17/2022   Aortic dissection distal to left subclavian (HCC) 08/17/2022   Diverticulosis 04/19/2022    Diverticulosis of sigmoid colon 04/19/2022   Palpitations 06/24/2021   Premature atrial contraction 06/24/2021   PVC (premature ventricular contraction) 06/24/2021   Abnormal EKG 06/24/2021   Dyslipidemia 03/24/2018   Postmenopausal estrogen deficiency 03/24/2018   Squamous cell carcinoma 08/10/2017   HSV-1 (herpes simplex virus 1) infection 03/16/2017   Osteoporosis 04/15/2016   Vulvar atrophy 11/07/2014   Abnormal Pap smear of cervix 11/07/2014    ONSET DATE: 03/28/2024 (date of referral)  REFERRING DIAG: I63.10 (ICD-10-CM) - Cerebrovascular accident (CVA) due to embolism of precerebral artery (HCC)  THERAPY DIAG:  Other abnormalities of gait and mobility  Muscle weakness (generalized)  Other lack of coordination  Rationale for Evaluation and Treatment: Rehabilitation  SUBJECTIVE: Likes to be called Terri Alexander.                                                                                                                                                                                           SUBJECTIVE STATEMENT: No acute changes.  No recent falls.  Back feeling good today.  No pain.  No concerns with HEP. Pt accompanied by: self (pt drove self)  PERTINENT HISTORY: Multifocal CVA's s/p TEVAR with subclavian branch endoprosthesis 03/19/24 (LUE and B groin incisions) to repair thoracic/abdominal aortic aneurysm (chronic type B aortic dissection with aneurysmal degeneration of the descending aorta).  Case complicated by dissection of the left subclavian artery with need for stenting of the axillary and brachial arteries to reestablish flow to left upper extremity. Pt with episode of transient aphasia that lasted a couple of minutes 03/20/24; episode of unsteadiness while walking 03/21/24.  03/21/24 imaging: Small acute infarcts: B frontal lobes, L occipital lobe, and L cerebellum.  Inpatient rehab stay 03/23/24-03/30/24.  PMH also includes anemia, a-fib, arrhythmia, h/o hiatal hernia, PVD  (type B aortic dissection), skin CA (nose, face).  PAIN: 0/10 R LBP Are you having pain? Yes: NPRS scale: 0/10 Pain location: B shoulders Pain description: muscle spasms R side Aggravating factors: unknown Relieving factors: heat;  salon pass (patches)  PRECAUTIONS: Fall; no lifting anything heavy with L UE (less than gallon of milk) per pt  RED FLAGS: None   WEIGHT BEARING RESTRICTIONS: No  FALLS: Has patient fallen in last 6 months? No  LIVING ENVIRONMENT: Lives with: lives with their family (husband) Lives in: House/apartment (townhouse; 1 level) Stairs: Yes: External: 1 steps; none Has following equipment at home: Single point cane, shower chair, and Grab bars  PLOF: Independent prior to surgery.  CGA/SBA ambulation without AD in IP rehab.  PATIENT GOALS: Get strength back.  Improve balance.  OBJECTIVE:  Note: Objective measures were completed at Evaluation unless otherwise noted.  DIAGNOSTIC FINDINGS:  Per MRI Brain 03/21/24: 1. Small acute infarcts in the bilateral frontal lobes, left occipital lobe, and left cerebellum. Given involvement of multiple vascular territories, consider embolic etiology.  COGNITION: Overall cognitive status: Within functional limits for tasks assessed   SENSATION: WFL (light touch)  COORDINATION: Good B heel to shin coordination in sitting.  Difficulty with rapid alternating toe taps in sitting B.  EDEMA:  General LE swelling (has had since surgery)  MUSCLE TONE: WNL  POSTURE: rounded shoulders and forward head  LOWER EXTREMITY MMT:    MMT Right Eval Left Eval  Hip flexion 4/5 4/5  Hip extension    Hip abduction    Hip adduction    Hip internal rotation    Hip external rotation    Knee flexion 4/5 4/5  Knee extension 4+/5 4+/5  Ankle dorsiflexion 4+/5 4+/5  Ankle plantarflexion At least 3/5 AROM At least 3/5 AROM  Ankle inversion    Ankle eversion    (Blank rows = not tested)  BED MOBILITY:   Independent  TRANSFERS: Sit to stand: Modified independence  Assistive device utilized: None     Stand to sit: Modified independence  Assistive device utilized: None      GAIT: Findings: Gait Characteristics: step through gait pattern, Distance walked: clinic distances, Assistive device utilized:None, Level of assistance: SBA, and Comments: no loss of balance ambulating without AD use; pt appearing fatigued though after  FUNCTIONAL TESTS:  5 times sit to stand: 12.09 seconds 10 meter walk test: 0.87 m/sec (11.38 seconds); HR 104 bpm post activity; 1.04 m/sec (9.59 seconds) 04/30/24 SLS: R LE 5.10 seconds; L LE unable to perform without assist for balance (52/56 on BERG 03/29/24 in inpatient rehab) FGA: 18/30 (no AD use) 04/16/24 : 1096 feet (no AD use) 04/16/24  FUNCTIONAL GAIT ASSESSMENT  Date: 04/16/24 Score  GAIT LEVEL SURFACE Instructions: Walk at your normal speed from here to the next mark (6 m) [20 ft]. (2) Mild impairment - Walks 6 m (20 ft) in less than 7 seconds but greater than 5.5 seconds, uses assistive device, slower speed, mild gait deviations, or deviates 15.24 -25.4 cm (6 -10 in) outside of the 30.48-cm (12-in) walkway width. 6.09 seconds  2.   CHANGE IN GAIT SPEED Instructions: Begin walking at your normal pace (for 1.5 m [5 ft]). When I tell you go, walk as fast as you can (for 1.5 m [5 ft]). When I tell you slow, walk as slowly as you can (for 1.5 m [5 ft]. (2) Mild impairment - Is able to change speed but demonstrates mild gait deviations, deviates 15.24 -25.4 cm (6 -10 in) outside of the 30.48-cm (12-in) walkway width, or no gait deviations but unable to achieve a significant change in velocity, or uses an assistive device  3.    GAIT WITH HORIZONTAL  HEAD TURNS Instructions: Walk from here to the next mark 6 m (20 ft) away. Begin walking at your normal pace. Keep walking straight; after 3 steps, turn your head to the right and keep walking straight while  looking to the right. After 3 more steps, turn your head to the left and keep walking straight while looking left. Continue alternating looking right and left. (3) Normal - Performs head turns smoothly with no change in gait. Deviates no more than 15.24 cm (6 in) outside 30.48-cm (12-in) walkway width.  4.   GAIT WITH VERTICAL HEAD TURNS Instructions: Walk from here to the next mark (6 m [20 ft]). Begin walking at your normal pace. Keep walking straight; after 3 steps, tip your head up and keep walking straight while looking up. After 3 more steps, tip your head down, keep walking straight while looking down. Continue  alternating looking up and down every 3 steps until you have completed 2 repetitions in each direction. (2) Mild impairment - Performs task with slight change in gait velocity (eg, minor disruption to smooth gait path), deviates 15.24 -25.4 cm (6 -10 in) outside 30.48-cm (12-in) walkway width or uses assistive device.  5.  GAIT AND PIVOT TURN Instructions: Begin with walking at your normal pace. When I tell you, turn and stop, turn as quickly as you can to face the opposite direction and stop. (3) Normal - Pivot turns safely within 3 seconds and stops quickly with no loss of balance  6.   STEP OVER OBSTACLE Instructions: Begin walking at your normal speed. When you come to the shoe box, step over it, not around it, and keep walking. (2) Mild impairment - Is able to step over one shoe box (11.43 cm [4.5 in] total height) without changing gait speed; no evidence of imbalance.  7.   GAIT WITH NARROW BASE OF SUPPORT Instructions: Walk on the floor with arms folded across the chest, feet aligned heel to toe in tandem for a distance of 3.6 m [12 ft]. The number of steps taken in a straight line are counted for a maximum of 10 steps. (0) Severe impairment - Ambulates less than 4 steps heel to toe or cannot perform without assistance.  8.   GAIT WITH EYES CLOSED Instructions: Walk at your normal  speed from here to the next mark (6 m [20 ft]) with your eyes closed. (0) Severe impairment - Cannot walk 6 m (20 ft) without assistance, severe gait deviations or imbalance, deviates greater than 38.1 cm (15 in) outside 30.48-cm (12-in) walkway width or will not attempt task. 8.28 seconds with significant R veer.  9.   AMBULATING BACKWARDS Instructions: Walk backwards until I tell you to stop (2) Mild impairment - Walks 6 m (20 ft), uses assistive device, slower speed, mild gait deviations, deviates 15.24 -25.4 cm (6 -10 in) outside 30.48-cm (12-in) walkway width. 12.68 seconds  10. STEPS Instructions: Walk up these stairs as you would at home (ie, using the rail if necessary). At the top turn around and walk down. (2) Mild impairment-Alternating feet, must use rail.  Total 18/30   Interpretation of scores: Non-Specific Older Adults Cutoff Score: <=22/30 = risk of falls Parkinsons Disease Cutoff score <15/30= fall risk (Hoehn & Yahr 1-4)  Minimally Clinically Important Difference (MCID)  Stroke (acute, subacute, and chronic) = MDC: 4.2 points Vestibular (acute) = MDC: 6 points Community Dwelling Older Adults =  MCID: 4 points Parkinsons Disease  =  MDC: 4.3 points  (Academy  of Neurologic Physical Therapy (nd). Functional Gait Assessment. Retrieved from https://www.neuropt.org/docs/default-source/cpgs/core-outcome-measures/function-gait-assessment-pocket-guide-proof9-(2).pdf?sfvrsn=b22f35043_0.)   PATIENT SURVEYS:  TBA                                                                                                                              TREATMENT DATE: 05/07/24  Self Care: BP and HR taken in sitting at rest beginning of session (see below for details).  Pt asymptomatic.  Monitored for symptoms during session. Vitals:   05/07/24 1232  BP: (!) 152/66  Pulse: 76    Therapeutic Exercise: SciFit multi-peaks up to level 4 for 8 minutes using B LEs for LE strengthening, dynamic  cardiovascular warmup and increased amplitude of stepping. RPE of 4/10 following activity.  Average stride length 10.7 inches.   Therapeutic Activity: 1/2 kneeling on red mat on floor x1 trial B (B UE reach out chest level with lightweight tube x10 reps each trial) x1 trial B (B UE reach out with anterior shift with lightweight tube x10 reps each trial) x1 trial B (bringing lightweight tube from hip to opposite knee with mild trunk rotation x10 reps each trial) Toe taps in // bars on Airex: x10 reps B LE's toe taps to 6 inch cone anterior to gumdrop lateral (toe tap on Airex between cone and gumdrop) x10 reps x2 sets B LE toe taps to 6 inch cone anterior to gumdrop lateral (NO toe tap on Airex between cone and gumdrop) Standing mini squats on Airex in // bars: x10 reps x2 sets Notes: CGA to min assist (posterior bias/lean noted with fatigue); vc's for B hip external rotation to avoid B knee IR during activity  PATIENT EDUCATION: Education details: Continue HEP. Person educated: Patient Education method: Explanation and Verbal cues Education comprehension: verbalized understanding, verbal cues required, and needs further education  HOME EXERCISE PROGRAM: Access Code: [email or web address] URL: [email or web address] Date: 04/27/2024 Prepared by: Damien Caulk  Exercises - Standing Tandem Balance with Counter Support  - 1 x daily - 5 x weekly - 2-3 sets - 10 reps - Mini Squat with Counter Support  - 1 x daily - 5 x weekly - 2-3 sets - 10 reps - Heel Raises with Counter Support  - 1 x daily - 5 x weekly - 2-3 sets - 10 reps - Seated Hip Abduction/External Rotation With Resistance at Thighs  - 1 x daily - 5 x weekly - 2-3 sets - 10 reps  GOALS: Goals reviewed with patient? Yes  SHORT TERM GOALS: Target date: 05/02/2024  Pt will be independent with initial HEP in order to improve strength and balance in order to decrease fall risk and improve function at home for ADL's. Baseline:   Issued Goal status: MET  2.  Assess FGA and update LTG as needed. Baseline: 18/30 04/16/24 Goal status: MET  3.  Assess and update LTG as needed. Baseline: 1096 feet on 04/16/24 Goal status: MET  4.  Pt will increase by at  least 0.13 m/s in order to demonstrate clinically significant improvement in community ambulation.  Baseline: 0.87 m/sec; 1.04 m/sec 04/30/24 Goal status: MET  LONG TERM GOALS: Target date: 05/30/2024  Pt will be independent with final HEP in order to improve strength and balance in order to decrease fall risk and improve function at home for ADL's. Baseline:  Goal status: INITIAL  2.  Patient will increase Functional Gait Assessment score to > or equal to 22/30 as to reduce fall risk and improve dynamic gait safety with community ambulation. Baseline: 18/30 04/16/24 Goal status: INITIAL  3.  Patient will tolerate 10 seconds of single leg stance B LE's without loss of balance to improve ability to get in and out of shower safely. Baseline: R LE 5.10 seconds; L LE unable to perform without assist for balance Goal status: INITIAL  4.  Pt will increase by at least 71m (164ft) in order to demonstrate clinically significant improvement in cardiopulmonary endurance and community ambulation. Baseline: 1096 feet on 04/16/24 Goal status: INITIAL   ASSESSMENT:  CLINICAL IMPRESSION: Patient was seen today for physical therapy treatment to address strength, stability, and balance.  Focused session on LE strengthening, core and hip stability (via 1/2 kneeling activities), and balance on compliant surface with dynamic activity.  Patient continues to be limited by strength and balance.  They demonstrate improvement in LE stability during balance activities but fatigued with repetition.  They would continue to benefit from skilled PT to address impairments as noted and progress towards long term goals.  OBJECTIVE IMPAIRMENTS: Abnormal gait, cardiopulmonary status  limiting activity, decreased activity tolerance, decreased balance, decreased coordination, decreased endurance, decreased knowledge of condition, decreased knowledge of use of DME, decreased mobility, difficulty walking, decreased strength, improper body mechanics, postural dysfunction, and pain.   ACTIVITY LIMITATIONS: carrying, lifting, bending, squatting, stairs, bathing, toileting, dressing, and locomotion level  PARTICIPATION LIMITATIONS: meal prep, cleaning, laundry, driving, shopping, community activity, and yard work  PERSONAL FACTORS: Age, Past/current experiences, Transportation, and 1-2 comorbidities: a-fib and PVD are also affecting patient's functional outcome.   REHAB POTENTIAL: Good  CLINICAL DECISION MAKING: Evolving/moderate complexity  EVALUATION COMPLEXITY: Moderate  PLAN:  PT FREQUENCY: 2x/week  PT DURATION: 8 weeks  PLANNED INTERVENTIONS: [id number]- PT Re-evaluation, 97750- Physical Performance Testing, 97110-Therapeutic exercises, 97530- Therapeutic activity, [id number]- Neuromuscular re-education, 97535- Self Care, [id number]- Manual therapy, [id number]- Gait training, [phone]- Orthotic Initial, [id number]- Orthotic/Prosthetic subsequent, [phone]- Aquatic Therapy, [phone]- Electrical stimulation (manual), [id number]- Ultrasound, [phone] (1-2 muscles), [id number] (3+ muscles)- Dry Needling, Patient/Family education, Balance training, Stair training, Taping, Joint mobilization, Spinal mobilization, Scar mobilization, Compression bandaging, DME instructions, Cryotherapy, Moist heat, and Biofeedback  PLAN FOR NEXT SESSION: Check on HEP?  LE strengthening; LE coordination; balance; aerobic endurance; single leg balance.   Damien Caulk, PT 05/07/2024, 3:21 PM        "

## 2024-05-08 ENCOUNTER — Telehealth: Payer: Self-pay | Admitting: *Deleted

## 2024-05-08 NOTE — Telephone Encounter (Signed)
 Triage . Pt called asking if antibiotic was needed for routine dental cleaning . Spoke with PA. Antibiotics is not needed per PA. Pt notified

## 2024-05-11 ENCOUNTER — Ambulatory Visit: Admitting: Physical Therapy

## 2024-05-11 ENCOUNTER — Ambulatory Visit

## 2024-05-11 ENCOUNTER — Encounter: Payer: Self-pay | Admitting: Physical Therapy

## 2024-05-11 VITALS — BP 127/57 | HR 75

## 2024-05-11 DIAGNOSIS — R278 Other lack of coordination: Secondary | ICD-10-CM

## 2024-05-11 DIAGNOSIS — M6281 Muscle weakness (generalized): Secondary | ICD-10-CM

## 2024-05-11 DIAGNOSIS — R2689 Other abnormalities of gait and mobility: Secondary | ICD-10-CM

## 2024-05-11 NOTE — Therapy (Signed)
 " OUTPATIENT PHYSICAL THERAPY NEURO TREATMENT   Patient Name: Terri Alexander MRN: [id number] DOB:[birth year], 73 y.o., female Today's Date: 05/11/2024   PCP: Terri Butler DASEN, MD REFERRING PROVIDER: [name], NP  END OF SESSION:  PT End of Session - 05/11/24 1030     Visit Number 7    Number of Visits 17   16 visits plus eval   Date for Recertification  06/15/24    Authorization Type Medicare A&B/[employer]HC    Progress Note Due on Visit 10   Follow Medicare Guidelines   PT Start Time 1029    PT Stop Time 1115    PT Time Calculation (min) 46 min    Equipment Utilized During Treatment Gait belt    Activity Tolerance Patient tolerated treatment well    Behavior During Therapy WFL for tasks assessed/performed          Past Medical History:  Diagnosis Date   Adenomatous colon polyp    Allergy    Anemia    as a child, no current problem   Arrhythmia    Atrial fibrillation (HCC)    Blood transfusion without reported diagnosis @ 9 months   9 months old   Cataract    bilateral - MD just watching    Dysrhythmia    A. Fib   GERD (gastroesophageal reflux disease)    History of hiatal hernia    Hyperlipidemia    Osteoporosis 04/15/2016   Peripheral vascular disease    Type B Aortic Dissection   Skin cancer 2013   Skin cancer- Nose, face   Stroke [facility])    Past Surgical History:  Procedure Laterality Date   ANGIOPLASTY, ARTERY, SUBCLAVIAN, ENDOVASCULAR, WITH STENT INSERTION Left 03/19/2024   Procedure: LEFT AXILLARY ARTERY STENT INSERTION;  Surgeon: Terri Fonda BRAVO, MD;  Location: [facility] OR;  Service: Vascular;  Laterality: Left;   ATRIAL FIBRILLATION ABLATION N/A 02/16/2023   Procedure: ATRIAL FIBRILLATION ABLATION;  Surgeon: Terri Eulas BRAVO, MD;  Location: [facility] INVASIVE CV LAB;  Service: Cardiovascular;  Laterality: N/A;   CARDIOVERSION N/A 09/09/2022   Procedure: CARDIOVERSION;  Surgeon: Terri Ronal BRAVO, MD;  Location: [facility] INVASIVE CV LAB;  Service: Cardiovascular;   Laterality: N/A;   CESAREAN SECTION  1981   x 1   CESAREAN SECTION     COLONOSCOPY  2016   hx polyp, tics, sigmoid colon mod tortuous Dr Terri Alexander,    EMBOLECTOMY Left 03/19/2024   Procedure: EMBOLECTOMY OF LEFT BRACHIAL ARTERY;  Surgeon: Terri Fonda BRAVO, MD;  Location: [facility] OR;  Service: Vascular;  Laterality: Left;   EYE SURGERY Bilateral    retina repair x 2   FEMORAL ARTERY EXPLORATION Left 03/19/2024   Procedure: EXPOSURE OF BRACHIAL ARTERY WITH STENT INSERTION;  Surgeon: Terri Fonda BRAVO, MD;  Location: [facility] OR;  Service: Vascular;  Laterality: Left;   LAPAROTOMY  1979   removal of ovarian cyst   nissan fundiplication  05/17/1997   REPAIR OF COMPLEX TRACTION RETINAL DETACHMENT Bilateral 01/2019   SVT ABLATION N/A 12/09/2023   Procedure: SVT ABLATION;  Surgeon: Terri Eulas BRAVO, MD;  Location: [facility] INVASIVE CV LAB;  Service: Cardiovascular;  Laterality: N/A;   THORACIC AORTIC ENDOVASCULAR STENT GRAFT Left 03/19/2024   Procedure: INSERTION, ENDOVASCULAR STENT GRAFT, AORTA, THORACIC; STENTING X2 OF LEFT SUBCLAVIAN ARTERY;  Surgeon: Terri Fonda BRAVO, MD;  Location: [facility] OR;  Service: Vascular;  Laterality: Left;   ULTRASOUND GUIDANCE FOR VASCULAR ACCESS Bilateral 03/19/2024   Procedure: ULTRASOUND GUIDANCE, FOR VASCULAR ACCESS  OF BILATERAL FEMORAL AND LEFT RADIAL ARTERY;  Surgeon: Terri Fonda BRAVO, MD;  Location: [facility] OR;  Service: Vascular;  Laterality: Bilateral;   Patient Active Problem List   Diagnosis Date Noted   CVA (cerebral vascular accident) (HCC) 03/23/2024   S/P AAA (abdominal aortic aneurysm) repair 03/19/2024   Thoracic aortic aneurysm, without rupture, unspecified 03/19/2024   Persistent atrial fibrillation (HCC) 08/25/2022   Hypercoagulable state due to persistent atrial fibrillation (HCC) 08/25/2022   Atrial fibrillation with RVR (HCC) 08/18/2022   Aortic dissection (HCC) 08/17/2022   Aortic dissection distal to left subclavian (HCC) 08/17/2022   Diverticulosis 04/19/2022    Diverticulosis of sigmoid colon 04/19/2022   Palpitations 06/24/2021   Premature atrial contraction 06/24/2021   PVC (premature ventricular contraction) 06/24/2021   Abnormal EKG 06/24/2021   Dyslipidemia 03/24/2018   Postmenopausal estrogen deficiency 03/24/2018   Squamous cell carcinoma 08/10/2017   HSV-1 (herpes simplex virus 1) infection 03/16/2017   Osteoporosis 04/15/2016   Vulvar atrophy 11/07/2014   Abnormal Pap smear of cervix 11/07/2014    ONSET DATE: 03/28/2024 (date of referral)  REFERRING DIAG: I63.10 (ICD-10-CM) - Cerebrovascular accident (CVA) due to embolism of precerebral artery (HCC)  THERAPY DIAG:  Other abnormalities of gait and mobility  Muscle weakness (generalized)  Other lack of coordination  Rationale for Evaluation and Treatment: Rehabilitation  SUBJECTIVE: Likes to be called [name].                                                                                                                                                                                           SUBJECTIVE STATEMENT: No recent falls.  No acute changes.  Had a nice holiday yesterday. Pt accompanied by: self (pt drove self)  PERTINENT HISTORY: Multifocal CVA's s/p TEVAR with subclavian branch endoprosthesis 03/19/24 (LUE and B groin incisions) to repair thoracic/abdominal aortic aneurysm (chronic type B aortic dissection with aneurysmal degeneration of the descending aorta).  Case complicated by dissection of the left subclavian artery with need for stenting of the axillary and brachial arteries to reestablish flow to left upper extremity. Pt with episode of transient aphasia that lasted a couple of minutes 03/20/24; episode of unsteadiness while walking 03/21/24.  03/21/24 imaging: Small acute infarcts: B frontal lobes, L occipital lobe, and L cerebellum.  Inpatient rehab stay 03/23/24-03/30/24.  PMH also includes anemia, a-fib, arrhythmia, h/o hiatal hernia, PVD (type B aortic dissection),  skin CA (nose, face).  PAIN: 0/10 R LBP Are you having pain? Yes: NPRS scale: 2-3/10 Pain location: B shoulders Pain description: muscle spasms R side Aggravating factors: unknown Relieving factors: heat; salon pass (patches)  PRECAUTIONS: Fall; no  lifting anything heavy with L UE (less than gallon of milk) per pt  RED FLAGS: None   WEIGHT BEARING RESTRICTIONS: No  FALLS: Has patient fallen in last 6 months? No  LIVING ENVIRONMENT: Lives with: lives with their family (husband) Lives in: House/apartment (townhouse; 1 level) Stairs: Yes: External: 1 steps; none Has following equipment at home: Single point cane, shower chair, and Grab bars  PLOF: Independent prior to surgery.  CGA/SBA ambulation without AD in IP rehab.  PATIENT GOALS: Get strength back.  Improve balance.  OBJECTIVE:  Note: Objective measures were completed at Evaluation unless otherwise noted.  DIAGNOSTIC FINDINGS:  Per MRI Brain 03/21/24: 1. Small acute infarcts in the bilateral frontal lobes, left occipital lobe, and left cerebellum. Given involvement of multiple vascular territories, consider embolic etiology.  COGNITION: Overall cognitive status: Within functional limits for tasks assessed   SENSATION: WFL (light touch)  COORDINATION: Good B heel to shin coordination in sitting.  Difficulty with rapid alternating toe taps in sitting B.  EDEMA:  General LE swelling (has had since surgery)  MUSCLE TONE: WNL  POSTURE: rounded shoulders and forward head  LOWER EXTREMITY MMT:    MMT Right Eval Left Eval  Hip flexion 4/5 4/5  Hip extension    Hip abduction    Hip adduction    Hip internal rotation    Hip external rotation    Knee flexion 4/5 4/5  Knee extension 4+/5 4+/5  Ankle dorsiflexion 4+/5 4+/5  Ankle plantarflexion At least 3/5 AROM At least 3/5 AROM  Ankle inversion    Ankle eversion    (Blank rows = not tested)  BED MOBILITY:  Independent  TRANSFERS: Sit to stand:  Modified independence  Assistive device utilized: None     Stand to sit: Modified independence  Assistive device utilized: None      GAIT: Findings: Gait Characteristics: step through gait pattern, Distance walked: clinic distances, Assistive device utilized:None, Level of assistance: SBA, and Comments: no loss of balance ambulating without AD use; pt appearing fatigued though after  FUNCTIONAL TESTS:  5 times sit to stand: 12.09 seconds 10 meter walk test: 0.87 m/sec (11.38 seconds); HR 104 bpm post activity; 1.04 m/sec (9.59 seconds) 04/30/24 SLS: R LE 5.10 seconds; L LE unable to perform without assist for balance (52/56 on BERG 03/29/24 in inpatient rehab) FGA: 18/30 (no AD use) 04/16/24 : 1096 feet (no AD use) 04/16/24  FUNCTIONAL GAIT ASSESSMENT  Date: 04/16/24 Score  GAIT LEVEL SURFACE Instructions: Walk at your normal speed from here to the next mark (6 m) [20 ft]. (2) Mild impairment - Walks 6 m (20 ft) in less than 7 seconds but greater than 5.5 seconds, uses assistive device, slower speed, mild gait deviations, or deviates 15.24 -25.4 cm (6 -10 in) outside of the 30.48-cm (12-in) walkway width. 6.09 seconds  2.   CHANGE IN GAIT SPEED Instructions: Begin walking at your normal pace (for 1.5 m [5 ft]). When I tell you go, walk as fast as you can (for 1.5 m [5 ft]). When I tell you slow, walk as slowly as you can (for 1.5 m [5 ft]. (2) Mild impairment - Is able to change speed but demonstrates mild gait deviations, deviates 15.24 -25.4 cm (6 -10 in) outside of the 30.48-cm (12-in) walkway width, or no gait deviations but unable to achieve a significant change in velocity, or uses an assistive device  3.    GAIT WITH HORIZONTAL HEAD TURNS Instructions: Walk from here to  the next mark 6 m (20 ft) away. Begin walking at your normal pace. Keep walking straight; after 3 steps, turn your head to the right and keep walking straight while looking to the right. After 3 more steps, turn  your head to the left and keep walking straight while looking left. Continue alternating looking right and left. (3) Normal - Performs head turns smoothly with no change in gait. Deviates no more than 15.24 cm (6 in) outside 30.48-cm (12-in) walkway width.  4.   GAIT WITH VERTICAL HEAD TURNS Instructions: Walk from here to the next mark (6 m [20 ft]). Begin walking at your normal pace. Keep walking straight; after 3 steps, tip your head up and keep walking straight while looking up. After 3 more steps, tip your head down, keep walking straight while looking down. Continue  alternating looking up and down every 3 steps until you have completed 2 repetitions in each direction. (2) Mild impairment - Performs task with slight change in gait velocity (eg, minor disruption to smooth gait path), deviates 15.24 -25.4 cm (6 -10 in) outside 30.48-cm (12-in) walkway width or uses assistive device.  5.  GAIT AND PIVOT TURN Instructions: Begin with walking at your normal pace. When I tell you, turn and stop, turn as quickly as you can to face the opposite direction and stop. (3) Normal - Pivot turns safely within 3 seconds and stops quickly with no loss of balance  6.   STEP OVER OBSTACLE Instructions: Begin walking at your normal speed. When you come to the shoe box, step over it, not around it, and keep walking. (2) Mild impairment - Is able to step over one shoe box (11.43 cm [4.5 in] total height) without changing gait speed; no evidence of imbalance.  7.   GAIT WITH NARROW BASE OF SUPPORT Instructions: Walk on the floor with arms folded across the chest, feet aligned heel to toe in tandem for a distance of 3.6 m [12 ft]. The number of steps taken in a straight line are counted for a maximum of 10 steps. (0) Severe impairment - Ambulates less than 4 steps heel to toe or cannot perform without assistance.  8.   GAIT WITH EYES CLOSED Instructions: Walk at your normal speed from here to the next mark (6 m [20 ft])  with your eyes closed. (0) Severe impairment - Cannot walk 6 m (20 ft) without assistance, severe gait deviations or imbalance, deviates greater than 38.1 cm (15 in) outside 30.48-cm (12-in) walkway width or will not attempt task. 8.28 seconds with significant R veer.  9.   AMBULATING BACKWARDS Instructions: Walk backwards until I tell you to stop (2) Mild impairment - Walks 6 m (20 ft), uses assistive device, slower speed, mild gait deviations, deviates 15.24 -25.4 cm (6 -10 in) outside 30.48-cm (12-in) walkway width. 12.68 seconds  10. STEPS Instructions: Walk up these stairs as you would at home (ie, using the rail if necessary). At the top turn around and walk down. (2) Mild impairment-Alternating feet, must use rail.  Total 18/30   Interpretation of scores: Non-Specific Older Adults Cutoff Score: <=22/30 = risk of falls Parkinsons Disease Cutoff score <15/30= fall risk (Hoehn & Yahr 1-4)  Minimally Clinically Important Difference (MCID)  Stroke (acute, subacute, and chronic) = MDC: 4.2 points Vestibular (acute) = MDC: 6 points Community Dwelling Older Adults =  MCID: 4 points Parkinsons Disease  =  MDC: 4.3 points  (Academy of Neu[employer]ic Physical Therapy (nd). Functional Gait  Assessment. Retrieved from https://www.neuropt.org/docs/default-source/cpgs/core-outcome-measures/function-gait-assessment-pocket-guide-proof9-(2).pdf?sfvrsn=b78f35043_0.)   PATIENT SURVEYS:  TBA                                                                                                                              TREATMENT DATE: 05/11/24  Self Care: BP and HR taken in sitting at rest beginning of session (see below for details). Vitals:   05/11/24 1032  BP: (!) 127/57  Pulse: 75   Therapeutic Exercise: SciFit multi-peaks up to level 5 for 8 minutes using BLEs for LE strengthening, dynamic cardiovascular warmup and increased amplitude of stepping. RPE of 5/10 following activity.  Average stride  length 10.1 inches.  Clamshells side-lying on mat table: x10 reps x2 sets B LE's Notes: initial vc's for technique  Therapeutic Activity: 6 Blaze pods on Random 1 color tap setting for improved balance and coordination.  Performed on 1 minute intervals with 1 minute rest periods.  Pt requires CGA to SBA guarding.  Pt standing on Airex for added balance challenge. Round 1:  2 Blaze pods ant L/R; 2 Blaze pods ant/lat L/R; 2 Blaze pods lateral L/R setup.  39 hits. Round 2:  2 Blaze pods ant L/R; 2 Blaze pods ant/lat L/R; 2 Blaze pods lateral L/R setup.  43 hits. Round 3:  2 Blaze pods ant L/R; 2 Blaze pods ant/lat L/R; 2 Blaze pods lateral L/R setup.  44 hits. Notable errors/deficits:  more challenging balance with ant or ant/lateral compared to lateral 6 Blaze pods on focus on one color setting for improved balance, coordination, and dual tasking.  Performed on 30 hits with 1 minute rest periods.  Pt requires CGA to SBA guarding.  Pt standing on Airex for added balance challenge. Round 1:  2 Blaze pods ant L/R; 2 Blaze pods ant/lat L/R; 2 Blaze pods lateral L/R setup.  30/30 hits. Round 2:  2 Blaze pods ant L/R; 2 Blaze pods ant/lat L/R; 2 Blaze pods lateral L/R setup.  30/30 hits. Notable errors/deficits:  more challenging with dual tasking (increased time to perform and coordinate movements) Mini-squats on Airex: x10 reps in // bars (no UE support) Notes: CGA to min assist for balance; R LE internal rotation noted requiring vc's for positioning (visual feedback via mirror)  PATIENT EDUCATION: Education details: Continue HEP. Person educated: Patient Education method: Explanation and Verbal cues Education comprehension: verbalized understanding, verbal cues required, and needs further education  HOME EXERCISE PROGRAM: Access Code: [email or web address] URL: [email or web address] Date: 04/27/2024 Prepared by: Damien Caulk  Exercises - Standing Tandem Balance with Counter Support  - 1  x daily - 5 x weekly - 2-3 sets - 10 reps - Mini Squat with Counter Support  - 1 x daily - 5 x weekly - 2-3 sets - 10 reps - Heel Raises with Counter Support  - 1 x daily - 5 x weekly - 2-3 sets - 10 reps - Seated Hip Abduction/External Rotation With Resistance at Thighs  - 1 x  daily - 5 x weekly - 2-3 sets - 10 reps  GOALS: Goals reviewed with patient? Yes  SHORT TERM GOALS: Target date: 05/02/2024  Pt will be independent with initial HEP in order to improve strength and balance in order to decrease fall risk and improve function at home for ADL's. Baseline:  Issued Goal status: MET  2.  Assess FGA and update LTG as needed. Baseline: 18/30 04/16/24 Goal status: MET  3.  Assess and update LTG as needed. Baseline: 1096 feet on 04/16/24 Goal status: MET  4.  Pt will increase by at least 0.13 m/s in order to demonstrate clinically significant improvement in community ambulation.  Baseline: 0.87 m/sec; 1.04 m/sec 04/30/24 Goal status: MET  LONG TERM GOALS: Target date: 05/30/2024  Pt will be independent with final HEP in order to improve strength and balance in order to decrease fall risk and improve function at home for ADL's. Baseline:  Goal status: INITIAL  2.  Patient will increase Functional Gait Assessment score to > or equal to 22/30 as to reduce fall risk and improve dynamic gait safety with community ambulation. Baseline: 18/30 04/16/24 Goal status: INITIAL  3.  Patient will tolerate 10 seconds of single leg stance B LE's without loss of balance to improve ability to get in and out of shower safely. Baseline: R LE 5.10 seconds; L LE unable to perform without assist for balance Goal status: INITIAL  4.  Pt will increase by at least 56m (159ft) in order to demonstrate clinically significant improvement in cardiopulmonary endurance and community ambulation. Baseline: 1096 feet on 04/16/24 Goal status: INITIAL   ASSESSMENT:  CLINICAL IMPRESSION: Patient was  seen today for physical therapy treatment to address strength, balance, and coordination.  Focused session on LE strengthening (general and hip external rotator strengthening) and use of Blaze pods (pt standing on compliant surface for added balance challenge) to improve balance and LE coordination.  Patient continues to be limited by strength and higher level balance.  They demonstrate improvement in balance in general today.  They would continue to benefit from skilled PT to address impairments as noted and progress towards long term goals.  OBJECTIVE IMPAIRMENTS: Abnormal gait, cardiopulmonary status limiting activity, decreased activity tolerance, decreased balance, decreased coordination, decreased endurance, decreased knowledge of condition, decreased knowledge of use of DME, decreased mobility, difficulty walking, decreased strength, improper body mechanics, postural dysfunction, and pain.   ACTIVITY LIMITATIONS: carrying, lifting, bending, squatting, stairs, bathing, toileting, dressing, and locomotion level  PARTICIPATION LIMITATIONS: meal prep, cleaning, laundry, driving, shopping, community activity, and yard work  PERSONAL FACTORS: Age, Past/current experiences, Transportation, and 1-2 comorbidities: a-fib and PVD are also affecting patient's functional outcome.   REHAB POTENTIAL: Good  CLINICAL DECISION MAKING: Evolving/moderate complexity  EVALUATION COMPLEXITY: Moderate  PLAN:  PT FREQUENCY: 2x/week  PT DURATION: 8 weeks  PLANNED INTERVENTIONS: [id number]- PT Re-evaluation, 97750- Physical Performance Testing, 97110-Therapeutic exercises, 97530- Therapeutic activity, [id number]- Neuromuscular re-education, 97535- Self Care, [id number]- Manual therapy, [id number]- Gait training, [phone]- Orthotic Initial, [phone]- Orthotic/Prosthetic subsequent, [phone]- Aquatic Therapy, [phone]- Electrical stimulation (manual), [id number]- Ultrasound, [phone] (1-2 muscles), [id number] (3+ muscles)- Dry Needling, Patient/Family  education, Balance training, Stair training, Taping, Joint mobilization, Spinal mobilization, Scar mobilization, Compression bandaging, DME instructions, Cryotherapy, Moist heat, and Biofeedback  PLAN FOR NEXT SESSION: Check on HEP.  LE strengthening; LE coordination; balance; aerobic endurance; single leg balance.   Damien Caulk, PT 05/11/2024, 11:30 AM        "

## 2024-05-14 ENCOUNTER — Ambulatory Visit: Admitting: Physical Therapy

## 2024-05-14 ENCOUNTER — Encounter: Payer: Self-pay | Admitting: Physical Therapy

## 2024-05-14 VITALS — BP 154/58 | HR 74

## 2024-05-14 DIAGNOSIS — R2689 Other abnormalities of gait and mobility: Secondary | ICD-10-CM | POA: Diagnosis not present

## 2024-05-14 DIAGNOSIS — R278 Other lack of coordination: Secondary | ICD-10-CM

## 2024-05-14 DIAGNOSIS — M6281 Muscle weakness (generalized): Secondary | ICD-10-CM

## 2024-05-14 NOTE — Therapy (Signed)
 " OUTPATIENT PHYSICAL THERAPY NEURO TREATMENT   Patient Name: Terri Alexander MRN: [id number] DOB:02-14-1951, 73 y.o., female Today's Date: 05/14/2024   PCP: Terri Butler DASEN, MD REFERRING PROVIDER: [name], NP  END OF SESSION:  PT End of Session - 05/14/24 1230     Visit Number 8    Number of Visits 17   16 visits plus eval   Date for Recertification  06/15/24    Authorization Type Medicare A&B/[employer]    Progress Note Due on Visit 10   Follow Medicare Guidelines   PT Start Time 1229    PT Stop Time 1311    PT Time Calculation (min) 42 min    Equipment Utilized During Treatment Gait belt    Activity Tolerance Patient tolerated treatment well    Behavior During Therapy WFL for tasks assessed/performed          Past Medical History:  Diagnosis Date   Adenomatous colon polyp    Allergy    Anemia    as a child, no current problem   Arrhythmia    Atrial fibrillation (HCC)    Blood transfusion without reported diagnosis @ 9 months   9 months old   Cataract    bilateral - MD just watching    Dysrhythmia    A. Fib   GERD (gastroesophageal reflux disease)    History of hiatal hernia    Hyperlipidemia    Osteoporosis 04/15/2016   Peripheral vascular disease    Type B Aortic Dissection   Skin cancer 2013   Skin cancer- Nose, face   Stroke [facility])    Past Surgical History:  Procedure Laterality Date   ANGIOPLASTY, ARTERY, SUBCLAVIAN, ENDOVASCULAR, WITH STENT INSERTION Left 03/19/2024   Procedure: LEFT AXILLARY ARTERY STENT INSERTION;  Surgeon: Terri Fonda BRAVO, MD;  Location: [facility] OR;  Service: Vascular;  Laterality: Left;   ATRIAL FIBRILLATION ABLATION N/A 02/16/2023   Procedure: ATRIAL FIBRILLATION ABLATION;  Surgeon: Terri Eulas BRAVO, MD;  Location: [facility] INVASIVE CV LAB;  Service: Cardiovascular;  Laterality: N/A;   CARDIOVERSION N/A 09/09/2022   Procedure: CARDIOVERSION;  Surgeon: Terri Ronal BRAVO, MD;  Location: [facility] INVASIVE CV LAB;  Service: Cardiovascular;   Laterality: N/A;   CESAREAN SECTION  1981   x 1   CESAREAN SECTION     COLONOSCOPY  2016   hx polyp, tics, sigmoid colon mod tortuous Dr Terri Alexander,    EMBOLECTOMY Left 03/19/2024   Procedure: EMBOLECTOMY OF LEFT BRACHIAL ARTERY;  Surgeon: Terri Fonda BRAVO, MD;  Location: [facility] OR;  Service: Vascular;  Laterality: Left;   EYE SURGERY Bilateral    retina repair x 2   FEMORAL ARTERY EXPLORATION Left 03/19/2024   Procedure: EXPOSURE OF BRACHIAL ARTERY WITH STENT INSERTION;  Surgeon: Terri Fonda BRAVO, MD;  Location: [facility] OR;  Service: Vascular;  Laterality: Left;   LAPAROTOMY  1979   removal of ovarian cyst   nissan fundiplication  05/17/1997   REPAIR OF COMPLEX TRACTION RETINAL DETACHMENT Bilateral 01/2019   SVT ABLATION N/A 12/09/2023   Procedure: SVT ABLATION;  Surgeon: Terri Eulas BRAVO, MD;  Location: [facility] INVASIVE CV LAB;  Service: Cardiovascular;  Laterality: N/A;   THORACIC AORTIC ENDOVASCULAR STENT GRAFT Left 03/19/2024   Procedure: INSERTION, ENDOVASCULAR STENT GRAFT, AORTA, THORACIC; STENTING X2 OF LEFT SUBCLAVIAN ARTERY;  Surgeon: Terri Fonda BRAVO, MD;  Location: [facility] OR;  Service: Vascular;  Laterality: Left;   ULTRASOUND GUIDANCE FOR VASCULAR ACCESS Bilateral 03/19/2024   Procedure: ULTRASOUND GUIDANCE, FOR VASCULAR ACCESS  OF BILATERAL FEMORAL AND LEFT RADIAL ARTERY;  Surgeon: Terri Fonda BRAVO, MD;  Location: [facility] OR;  Service: Vascular;  Laterality: Bilateral;   Patient Active Problem List   Diagnosis Date Noted   CVA (cerebral vascular accident) (HCC) 03/23/2024   S/P AAA (abdominal aortic aneurysm) repair 03/19/2024   Thoracic aortic aneurysm, without rupture, unspecified 03/19/2024   Persistent atrial fibrillation (HCC) 08/25/2022   Hypercoagulable state due to persistent atrial fibrillation (HCC) 08/25/2022   Atrial fibrillation with RVR (HCC) 08/18/2022   Aortic dissection (HCC) 08/17/2022   Aortic dissection distal to left subclavian (HCC) 08/17/2022   Diverticulosis 04/19/2022    Diverticulosis of sigmoid colon 04/19/2022   Palpitations 06/24/2021   Premature atrial contraction 06/24/2021   PVC (premature ventricular contraction) 06/24/2021   Abnormal EKG 06/24/2021   Dyslipidemia 03/24/2018   Postmenopausal estrogen deficiency 03/24/2018   Squamous cell carcinoma 08/10/2017   HSV-1 (herpes simplex virus 1) infection 03/16/2017   Osteoporosis 04/15/2016   Vulvar atrophy 11/07/2014   Abnormal Pap smear of cervix 11/07/2014    ONSET DATE: 03/28/2024 (date of referral)  REFERRING DIAG: I63.10 (ICD-10-CM) - Cerebrovascular accident (CVA) due to embolism of precerebral artery (HCC)  THERAPY DIAG:  Other abnormalities of gait and mobility  Muscle weakness (generalized)  Other lack of coordination  Rationale for Evaluation and Treatment: Rehabilitation  SUBJECTIVE: Likes to be called Terri Alexander.                                                                                                                                                                                           SUBJECTIVE STATEMENT: No acute changes.  No recent falls.  Was tired yesterday but feeling fine today.  BP lower this morning when pt checked it. Pt accompanied by: self (and pt's husband)  PERTINENT HISTORY: Multifocal CVA's s/p TEVAR with subclavian branch endoprosthesis 03/19/24 (LUE and B groin incisions) to repair thoracic/abdominal aortic aneurysm (chronic type B aortic dissection with aneurysmal degeneration of the descending aorta).  Case complicated by dissection of the left subclavian artery with need for stenting of the axillary and brachial arteries to reestablish flow to left upper extremity. Pt with episode of transient aphasia that lasted a couple of minutes 03/20/24; episode of unsteadiness while walking 03/21/24.  03/21/24 imaging: Small acute infarcts: B frontal lobes, L occipital lobe, and L cerebellum.  Inpatient rehab stay 03/23/24-03/30/24.  PMH also includes anemia, a-fib,  arrhythmia, h/o hiatal hernia, PVD (type B aortic dissection), skin CA (nose, face).  PAIN: 0/10 R LBP Are you having pain? Yes: NPRS scale: 2/10 Pain location: B shoulders Pain description: muscle spasms R side Aggravating factors:  unknown Relieving factors: heat; salon pass (patches)  PRECAUTIONS: Fall; no lifting anything heavy with L UE (less than gallon of milk) per pt  RED FLAGS: None   WEIGHT BEARING RESTRICTIONS: No  FALLS: Has patient fallen in last 6 months? No  LIVING ENVIRONMENT: Lives with: lives with their family (husband) Lives in: House/apartment (townhouse; 1 level) Stairs: Yes: External: 1 steps; none Has following equipment at home: Single point cane, shower chair, and Grab bars  PLOF: Independent prior to surgery.  CGA/SBA ambulation without AD in IP rehab.  PATIENT GOALS: Get strength back.  Improve balance.  OBJECTIVE:  Note: Objective measures were completed at Evaluation unless otherwise noted.  DIAGNOSTIC FINDINGS:  Per MRI Brain 03/21/24: 1. Small acute infarcts in the bilateral frontal lobes, left occipital lobe, and left cerebellum. Given involvement of multiple vascular territories, consider embolic etiology.  COGNITION: Overall cognitive status: Within functional limits for tasks assessed   SENSATION: WFL (light touch)  COORDINATION: Good B heel to shin coordination in sitting.  Difficulty with rapid alternating toe taps in sitting B.  EDEMA:  General LE swelling (has had since surgery)  MUSCLE TONE: WNL  POSTURE: rounded shoulders and forward head  LOWER EXTREMITY MMT:    MMT Right Eval Left Eval  Hip flexion 4/5 4/5  Hip extension    Hip abduction    Hip adduction    Hip internal rotation    Hip external rotation    Knee flexion 4/5 4/5  Knee extension 4+/5 4+/5  Ankle dorsiflexion 4+/5 4+/5  Ankle plantarflexion At least 3/5 AROM At least 3/5 AROM  Ankle inversion    Ankle eversion    (Blank rows = not  tested)  BED MOBILITY:  Independent  TRANSFERS: Sit to stand: Modified independence  Assistive device utilized: None     Stand to sit: Modified independence  Assistive device utilized: None      GAIT: Findings: Gait Characteristics: step through gait pattern, Distance walked: clinic distances, Assistive device utilized:None, Level of assistance: SBA, and Comments: no loss of balance ambulating without AD use; pt appearing fatigued though after  FUNCTIONAL TESTS:  5 times sit to stand: 12.09 seconds 10 meter walk test: 0.87 m/sec (11.38 seconds); HR 104 bpm post activity; 1.04 m/sec (9.59 seconds) 04/30/24 SLS: R LE 5.10 seconds; L LE unable to perform without assist for balance (52/56 on BERG 03/29/24 in inpatient rehab) FGA: 18/30 (no AD use) 04/16/24 : 1096 feet (no AD use) 04/16/24  FUNCTIONAL GAIT ASSESSMENT  Date: 04/16/24 Score  GAIT LEVEL SURFACE Instructions: Walk at your normal speed from here to the next mark (6 m) [20 ft]. (2) Mild impairment - Walks 6 m (20 ft) in less than 7 seconds but greater than 5.5 seconds, uses assistive device, slower speed, mild gait deviations, or deviates 15.24 -25.4 cm (6 -10 in) outside of the 30.48-cm (12-in) walkway width. 6.09 seconds  2.   CHANGE IN GAIT SPEED Instructions: Begin walking at your normal pace (for 1.5 m [5 ft]). When I tell you go, walk as fast as you can (for 1.5 m [5 ft]). When I tell you slow, walk as slowly as you can (for 1.5 m [5 ft]. (2) Mild impairment - Is able to change speed but demonstrates mild gait deviations, deviates 15.24 -25.4 cm (6 -10 in) outside of the 30.48-cm (12-in) walkway width, or no gait deviations but unable to achieve a significant change in velocity, or uses an assistive device  3.  GAIT WITH HORIZONTAL HEAD TURNS Instructions: Walk from here to the next mark 6 m (20 ft) away. Begin walking at your normal pace. Keep walking straight; after 3 steps, turn your head to the right and keep  walking straight while looking to the right. After 3 more steps, turn your head to the left and keep walking straight while looking left. Continue alternating looking right and left. (3) Normal - Performs head turns smoothly with no change in gait. Deviates no more than 15.24 cm (6 in) outside 30.48-cm (12-in) walkway width.  4.   GAIT WITH VERTICAL HEAD TURNS Instructions: Walk from here to the next mark (6 m [20 ft]). Begin walking at your normal pace. Keep walking straight; after 3 steps, tip your head up and keep walking straight while looking up. After 3 more steps, tip your head down, keep walking straight while looking down. Continue  alternating looking up and down every 3 steps until you have completed 2 repetitions in each direction. (2) Mild impairment - Performs task with slight change in gait velocity (eg, minor disruption to smooth gait path), deviates 15.24 -25.4 cm (6 -10 in) outside 30.48-cm (12-in) walkway width or uses assistive device.  5.  GAIT AND PIVOT TURN Instructions: Begin with walking at your normal pace. When I tell you, turn and stop, turn as quickly as you can to face the opposite direction and stop. (3) Normal - Pivot turns safely within 3 seconds and stops quickly with no loss of balance  6.   STEP OVER OBSTACLE Instructions: Begin walking at your normal speed. When you come to the shoe box, step over it, not around it, and keep walking. (2) Mild impairment - Is able to step over one shoe box (11.43 cm [4.5 in] total height) without changing gait speed; no evidence of imbalance.  7.   GAIT WITH NARROW BASE OF SUPPORT Instructions: Walk on the floor with arms folded across the chest, feet aligned heel to toe in tandem for a distance of 3.6 m [12 ft]. The number of steps taken in a straight line are counted for a maximum of 10 steps. (0) Severe impairment - Ambulates less than 4 steps heel to toe or cannot perform without assistance.  8.   GAIT WITH EYES  CLOSED Instructions: Walk at your normal speed from here to the next mark (6 m [20 ft]) with your eyes closed. (0) Severe impairment - Cannot walk 6 m (20 ft) without assistance, severe gait deviations or imbalance, deviates greater than 38.1 cm (15 in) outside 30.48-cm (12-in) walkway width or will not attempt task. 8.28 seconds with significant R veer.  9.   AMBULATING BACKWARDS Instructions: Walk backwards until I tell you to stop (2) Mild impairment - Walks 6 m (20 ft), uses assistive device, slower speed, mild gait deviations, deviates 15.24 -25.4 cm (6 -10 in) outside 30.48-cm (12-in) walkway width. 12.68 seconds  10. STEPS Instructions: Walk up these stairs as you would at home (ie, using the rail if necessary). At the top turn around and walk down. (2) Mild impairment-Alternating feet, must use rail.  Total 18/30   Interpretation of scores: Non-Specific Older Adults Cutoff Score: <=22/30 = risk of falls Parkinsons Disease Cutoff score <15/30= fall risk (Hoehn & Yahr 1-4)  Minimally Clinically Important Difference (MCID)  Stroke (acute, subacute, and chronic) = MDC: 4.2 points Vestibular (acute) = MDC: 6 points Community Dwelling Older Adults =  MCID: 4 points Parkinsons Disease  =  MDC: 4.3  points  (Academy of Neu[employer]ic Physical Therapy (nd). Functional Gait Assessment. Retrieved from https://www.neuropt.org/docs/default-source/cpgs/core-outcome-measures/function-gait-assessment-pocket-guide-proof9-(2).pdf?sfvrsn=b33f35043_0.)   PATIENT SURVEYS:  TBA                                                                                                                              TREATMENT DATE: 05/14/24  Self Care: BP and HR taken in sitting at rest beginning of session (see below for details). Vitals:   05/14/24 1233  BP: (!) 154/58  Pulse: 74   Therapeutic Exercise: SciFit multi-peaks up to level 5 for 8 minutes using BLEs for LE strengthening, dynamic cardiovascular warmup  and increased amplitude of stepping. RPE of 6-7/10 following activity.  Average stride length 11.1 inches.  Standing LE hip aBduction in // bars: x10 reps x2 sets B LE's (initial vc's for technique)  Therapeutic Activity: Standing activity in // bars: 9 feet foam balance beam: Heel to toe tandem walking x4 trials: CGA; pt with intermittent UE support for balance Sidestepping (L then R = 1 rep): x2 reps (intermittent UE support for balance) Standing on Airex: Toe taps to gumdrops (forward then lateral then retro =1 rep): x10 reps x2 sets B LE's (intermittent UE support for balance)  PATIENT EDUCATION: Education details: Continue HEP. Person educated: Patient Education method: Explanation and Verbal cues Education comprehension: verbalized understanding, verbal cues required, and needs further education  HOME EXERCISE PROGRAM: Access Code: [email or web address] URL: [email or web address] Date: 04/27/2024 Prepared by: Damien Caulk  Exercises - Standing Tandem Balance with Counter Support  - 1 x daily - 5 x weekly - 2-3 sets - 10 reps - Mini Squat with Counter Support  - 1 x daily - 5 x weekly - 2-3 sets - 10 reps - Heel Raises with Counter Support  - 1 x daily - 5 x weekly - 2-3 sets - 10 reps - Seated Hip Abduction/External Rotation With Resistance at Thighs  - 1 x daily - 5 x weekly - 2-3 sets - 10 reps  GOALS: Goals reviewed with patient? Yes  SHORT TERM GOALS: Target date: 05/02/2024  Pt will be independent with initial HEP in order to improve strength and balance in order to decrease fall risk and improve function at home for ADL's. Baseline:  Issued Goal status: MET  2.  Assess FGA and update LTG as needed. Baseline: 18/30 04/16/24 Goal status: MET  3.  Assess and update LTG as needed. Baseline: 1096 feet on 04/16/24 Goal status: MET  4.  Pt will increase by at least 0.13 m/s in order to demonstrate clinically significant improvement in community  ambulation.  Baseline: 0.87 m/sec; 1.04 m/sec 04/30/24 Goal status: MET  LONG TERM GOALS: Target date: 05/30/2024  Pt will be independent with final HEP in order to improve strength and balance in order to decrease fall risk and improve function at home for ADL's. Baseline:  Goal status: INITIAL  2.  Patient will increase Functional Gait Assessment score to >  or equal to 22/30 as to reduce fall risk and improve dynamic gait safety with community ambulation. Baseline: 18/30 04/16/24 Goal status: INITIAL  3.  Patient will tolerate 10 seconds of single leg stance B LE's without loss of balance to improve ability to get in and out of shower safely. Baseline: R LE 5.10 seconds; L LE unable to perform without assist for balance Goal status: INITIAL  4.  Pt will increase by at least 55m (165ft) in order to demonstrate clinically significant improvement in cardiopulmonary endurance and community ambulation. Baseline: 1096 feet on 04/16/24 Goal status: INITIAL   ASSESSMENT:  CLINICAL IMPRESSION: Patient was seen today for physical therapy treatment to address strength and balance.  Focused session on LE strengthening and balance on compliant surface.  Pt reporting mild LE fatigue with activities.  Patient continues to be limited by strength and higher level balance (pt provided with sitting rest breaks and paced during session).  They demonstrate improvement in balance reaction responses with practice/repetition.  They would continue to benefit from skilled PT to address impairments as noted and progress towards long term goals.  OBJECTIVE IMPAIRMENTS: Abnormal gait, cardiopulmonary status limiting activity, decreased activity tolerance, decreased balance, decreased coordination, decreased endurance, decreased knowledge of condition, decreased knowledge of use of DME, decreased mobility, difficulty walking, decreased strength, improper body mechanics, postural dysfunction, and pain.   ACTIVITY  LIMITATIONS: carrying, lifting, bending, squatting, stairs, bathing, toileting, dressing, and locomotion level  PARTICIPATION LIMITATIONS: meal prep, cleaning, laundry, driving, shopping, community activity, and yard work  PERSONAL FACTORS: Age, Past/current experiences, Transportation, and 1-2 comorbidities: a-fib and PVD are also affecting patient's functional outcome.   REHAB POTENTIAL: Good  CLINICAL DECISION MAKING: Evolving/moderate complexity  EVALUATION COMPLEXITY: Moderate  PLAN:  PT FREQUENCY: 2x/week  PT DURATION: 8 weeks  PLANNED INTERVENTIONS: [id number]- PT Re-evaluation, 97750- Physical Performance Testing, 97110-Therapeutic exercises, 97530- Therapeutic activity, [id number]- Neuromuscular re-education, 97535- Self Care, [id number]- Manual therapy, [id number]- Gait training, [phone]- Orthotic Initial, [phone]- Orthotic/Prosthetic subsequent, [phone]- Aquatic Therapy, [phone]- Electrical stimulation (manual), [id number]- Ultrasound, [phone] (1-2 muscles), [id number] (3+ muscles)- Dry Needling, Patient/Family education, Balance training, Stair training, Taping, Joint mobilization, Spinal mobilization, Scar mobilization, Compression bandaging, DME instructions, Cryotherapy, Moist heat, and Biofeedback  PLAN FOR NEXT SESSION: Check on HEP.  LE strengthening; LE coordination; balance; aerobic endurance; single leg balance.   Damien Caulk, PT 05/14/2024, 4:34 PM        "

## 2024-05-18 ENCOUNTER — Encounter: Payer: Self-pay | Admitting: Physical Therapy

## 2024-05-18 ENCOUNTER — Ambulatory Visit

## 2024-05-18 ENCOUNTER — Ambulatory Visit: Attending: Student | Admitting: Physical Therapy

## 2024-05-18 VITALS — BP 118/61 | HR 85

## 2024-05-18 DIAGNOSIS — I6310 Cerebral infarction due to embolism of unspecified precerebral artery: Secondary | ICD-10-CM | POA: Diagnosis present

## 2024-05-18 DIAGNOSIS — M6281 Muscle weakness (generalized): Secondary | ICD-10-CM | POA: Diagnosis present

## 2024-05-18 DIAGNOSIS — R2689 Other abnormalities of gait and mobility: Secondary | ICD-10-CM | POA: Diagnosis present

## 2024-05-18 DIAGNOSIS — R278 Other lack of coordination: Secondary | ICD-10-CM | POA: Diagnosis present

## 2024-05-18 NOTE — Therapy (Signed)
 " OUTPATIENT PHYSICAL THERAPY NEURO TREATMENT   Patient Name: Terri Alexander MRN: [id number] DOB:19-May-1950, 74 y.o., female Today's Date: 05/18/2024   PCP: [name], MD REFERRING PROVIDER: [name], NP  END OF SESSION:  PT End of Session - 05/18/24 1231     Visit Number 9    Number of Visits 17   16 visits plus eval   Date for Recertification  06/15/24    Authorization Type Medicare A&B/[employer]    Progress Note Due on Visit 10   Follow Medicare Guidelines   PT Start Time 1230    PT Stop Time 1315    PT Time Calculation (min) 45 min    Equipment Utilized During Treatment Gait belt    Activity Tolerance Patient tolerated treatment well    Behavior During Therapy WFL for tasks assessed/performed          Past Medical History:  Diagnosis Date   Adenomatous colon polyp    Allergy    Anemia    as a child, no current problem   Arrhythmia    Atrial fibrillation (HCC)    Blood transfusion without reported diagnosis @ 9 months   9 months old   Cataract    bilateral - MD just watching    Dysrhythmia    A. Fib   GERD (gastroesophageal reflux disease)    History of hiatal hernia    Hyperlipidemia    Osteoporosis 04/15/2016   Peripheral vascular disease    Type B Aortic Dissection   Skin cancer 2013   Skin cancer- Nose, face   Stroke [facility])    Past Surgical History:  Procedure Laterality Date   ANGIOPLASTY, ARTERY, SUBCLAVIAN, ENDOVASCULAR, WITH STENT INSERTION Left 03/19/2024   Procedure: LEFT AXILLARY ARTERY STENT INSERTION;  Surgeon: [name], MD;  Location: [facility] OR;  Service: Vascular;  Laterality: Left;   ATRIAL FIBRILLATION ABLATION N/A 02/16/2023   Procedure: ATRIAL FIBRILLATION ABLATION;  Surgeon: [name], MD;  Location: [facility] INVASIVE CV LAB;  Service: Cardiovascular;  Laterality: N/A;   CARDIOVERSION N/A 09/09/2022   Procedure: CARDIOVERSION;  Surgeon: [name], MD;  Location: [facility] INVASIVE CV LAB;  Service: Cardiovascular;   Laterality: N/A;   CESAREAN SECTION  1981   x 1   CESAREAN SECTION     COLONOSCOPY  2016   hx polyp, tics, sigmoid colon mod tortuous Dr [name],    EMBOLECTOMY Left 03/19/2024   Procedure: EMBOLECTOMY OF LEFT BRACHIAL ARTERY;  Surgeon: [name], MD;  Location: [facility] OR;  Service: Vascular;  Laterality: Left;   EYE SURGERY Bilateral    retina repair x 2   FEMORAL ARTERY EXPLORATION Left 03/19/2024   Procedure: EXPOSURE OF BRACHIAL ARTERY WITH STENT INSERTION;  Surgeon: [name], MD;  Location: [facility] OR;  Service: Vascular;  Laterality: Left;   LAPAROTOMY  1979   removal of ovarian cyst   nissan fundiplication  05/17/1997   REPAIR OF COMPLEX TRACTION RETINAL DETACHMENT Bilateral 01/2019   SVT ABLATION N/A 12/09/2023   Procedure: SVT ABLATION;  Surgeon: [name], MD;  Location: [facility] INVASIVE CV LAB;  Service: Cardiovascular;  Laterality: N/A;   THORACIC AORTIC ENDOVASCULAR STENT GRAFT Left 03/19/2024   Procedure: INSERTION, ENDOVASCULAR STENT GRAFT, AORTA, THORACIC; STENTING X2 OF LEFT SUBCLAVIAN ARTERY;  Surgeon: [name], MD;  Location: [facility] OR;  Service: Vascular;  Laterality: Left;   ULTRASOUND GUIDANCE FOR VASCULAR ACCESS Bilateral 03/19/2024   Procedure: ULTRASOUND GUIDANCE, FOR VASCULAR ACCESS  OF BILATERAL FEMORAL AND LEFT RADIAL ARTERY;  Surgeon: [name], MD;  Location: [facility] OR;  Service: Vascular;  Laterality: Bilateral;   Patient Active Problem List   Diagnosis Date Noted   CVA (cerebral vascular accident) (HCC) 03/23/2024   S/P AAA (abdominal aortic aneurysm) repair 03/19/2024   Thoracic aortic aneurysm, without rupture, unspecified 03/19/2024   Persistent atrial fibrillation (HCC) 08/25/2022   Hypercoagulable state due to persistent atrial fibrillation (HCC) 08/25/2022   Atrial fibrillation with RVR (HCC) 08/18/2022   Aortic dissection (HCC) 08/17/2022   Aortic dissection distal to left subclavian (HCC) 08/17/2022   Diverticulosis 04/19/2022    Diverticulosis of sigmoid colon 04/19/2022   Palpitations 06/24/2021   Premature atrial contraction 06/24/2021   PVC (premature ventricular contraction) 06/24/2021   Abnormal EKG 06/24/2021   Dyslipidemia 03/24/2018   Postmenopausal estrogen deficiency 03/24/2018   Squamous cell carcinoma 08/10/2017   HSV-1 (herpes simplex virus 1) infection 03/16/2017   Osteoporosis 04/15/2016   Vulvar atrophy 11/07/2014   Abnormal Pap smear of cervix 11/07/2014    ONSET DATE: 03/28/2024 (date of referral)  REFERRING DIAG: I63.10 (ICD-10-CM) - Cerebrovascular accident (CVA) due to embolism of precerebral artery (HCC)  THERAPY DIAG:  Other abnormalities of gait and mobility  Muscle weakness (generalized)  Other lack of coordination  Cerebrovascular accident (CVA) due to embolism of precerebral artery (HCC)  Rationale for Evaluation and Treatment: Rehabilitation  SUBJECTIVE: Likes to be called Terri Alexander.                                                                                                                                                                                           SUBJECTIVE STATEMENT: No recent falls.  No acute changes.  Brought BP log in (BP was 101/70 around 12:10pm).  Is monitoring BP (typically in morning); is not on any BP medication.  No current pain in shoulders (but had pain this morning when doing a lot of UE activity). Pt accompanied by: self  PERTINENT HISTORY: Multifocal CVA's s/p TEVAR with subclavian branch endoprosthesis 03/19/24 (LUE and B groin incisions) to repair thoracic/abdominal aortic aneurysm (chronic type B aortic dissection with aneurysmal degeneration of the descending aorta).  Case complicated by dissection of the left subclavian artery with need for stenting of the axillary and brachial arteries to reestablish flow to left upper extremity. Pt with episode of transient aphasia that lasted a couple of minutes 03/20/24; episode of unsteadiness while  walking 03/21/24.  03/21/24 imaging: Small acute infarcts: B frontal lobes, L occipital lobe, and L cerebellum.  Inpatient rehab stay 03/23/24-03/30/24.  PMH also includes anemia, a-fib, arrhythmia, h/o hiatal hernia, PVD (type  B aortic dissection), skin CA (nose, face).  PAIN: 0/10 R LBP Are you having pain? Yes: NPRS scale: 0/10 Pain location: B shoulders Pain description: muscle spasms R side Aggravating factors: unknown Relieving factors: heat; salon pass (patches)  PRECAUTIONS: Fall; no lifting anything heavy with L UE (less than gallon of milk) per pt  RED FLAGS: None   WEIGHT BEARING RESTRICTIONS: No  FALLS: Has patient fallen in last 6 months? No  LIVING ENVIRONMENT: Lives with: lives with their family (husband) Lives in: House/apartment (townhouse; 1 level) Stairs: Yes: External: 1 steps; none Has following equipment at home: Single point cane, shower chair, and Grab bars  PLOF: Independent prior to surgery.  CGA/SBA ambulation without AD in IP rehab.  PATIENT GOALS: Get strength back.  Improve balance.  OBJECTIVE:  Note: Objective measures were completed at Evaluation unless otherwise noted.  DIAGNOSTIC FINDINGS:  Per MRI Brain 03/21/24: 1. Small acute infarcts in the bilateral frontal lobes, left occipital lobe, and left cerebellum. Given involvement of multiple vascular territories, consider embolic etiology.  COGNITION: Overall cognitive status: Within functional limits for tasks assessed   SENSATION: WFL (light touch)  COORDINATION: Good B heel to shin coordination in sitting.  Difficulty with rapid alternating toe taps in sitting B.  EDEMA:  General LE swelling (has had since surgery)  MUSCLE TONE: WNL  POSTURE: rounded shoulders and forward head  LOWER EXTREMITY MMT:    MMT Right Eval Left Eval  Hip flexion 4/5 4/5  Hip extension    Hip abduction    Hip adduction    Hip internal rotation    Hip external rotation    Knee flexion 4/5 4/5   Knee extension 4+/5 4+/5  Ankle dorsiflexion 4+/5 4+/5  Ankle plantarflexion At least 3/5 AROM At least 3/5 AROM  Ankle inversion    Ankle eversion    (Blank rows = not tested)  BED MOBILITY:  Independent  TRANSFERS: Sit to stand: Modified independence  Assistive device utilized: None     Stand to sit: Modified independence  Assistive device utilized: None      GAIT: Findings: Gait Characteristics: step through gait pattern, Distance walked: clinic distances, Assistive device utilized:None, Level of assistance: SBA, and Comments: no loss of balance ambulating without AD use; pt appearing fatigued though after  FUNCTIONAL TESTS:  5 times sit to stand: 12.09 seconds 10 meter walk test: 0.87 m/sec (11.38 seconds); HR 104 bpm post activity; 1.04 m/sec (9.59 seconds) 04/30/24 SLS: R LE 5.10 seconds; L LE unable to perform without assist for balance (52/56 on BERG 03/29/24 in inpatient rehab) FGA: 18/30 (no AD use) 04/16/24 : 1096 feet (no AD use) 04/16/24  FUNCTIONAL GAIT ASSESSMENT  Date: 04/16/24 Score  GAIT LEVEL SURFACE Instructions: Walk at your normal speed from here to the next mark (6 m) [20 ft]. (2) Mild impairment - Walks 6 m (20 ft) in less than 7 seconds but greater than 5.5 seconds, uses assistive device, slower speed, mild gait deviations, or deviates 15.24 -25.4 cm (6 -10 in) outside of the 30.48-cm (12-in) walkway width. 6.09 seconds  2.   CHANGE IN GAIT SPEED Instructions: Begin walking at your normal pace (for 1.5 m [5 ft]). When I tell you go, walk as fast as you can (for 1.5 m [5 ft]). When I tell you slow, walk as slowly as you can (for 1.5 m [5 ft]. (2) Mild impairment - Is able to change speed but demonstrates mild gait deviations, deviates 15.24 -25.4 cm (  6 -10 in) outside of the 30.48-cm (12-in) walkway width, or no gait deviations but unable to achieve a significant change in velocity, or uses an assistive device  3.    GAIT WITH HORIZONTAL HEAD  TURNS Instructions: Walk from here to the next mark 6 m (20 ft) away. Begin walking at your normal pace. Keep walking straight; after 3 steps, turn your head to the right and keep walking straight while looking to the right. After 3 more steps, turn your head to the left and keep walking straight while looking left. Continue alternating looking right and left. (3) Normal - Performs head turns smoothly with no change in gait. Deviates no more than 15.24 cm (6 in) outside 30.48-cm (12-in) walkway width.  4.   GAIT WITH VERTICAL HEAD TURNS Instructions: Walk from here to the next mark (6 m [20 ft]). Begin walking at your normal pace. Keep walking straight; after 3 steps, tip your head up and keep walking straight while looking up. After 3 more steps, tip your head down, keep walking straight while looking down. Continue  alternating looking up and down every 3 steps until you have completed 2 repetitions in each direction. (2) Mild impairment - Performs task with slight change in gait velocity (eg, minor disruption to smooth gait path), deviates 15.24 -25.4 cm (6 -10 in) outside 30.48-cm (12-in) walkway width or uses assistive device.  5.  GAIT AND PIVOT TURN Instructions: Begin with walking at your normal pace. When I tell you, turn and stop, turn as quickly as you can to face the opposite direction and stop. (3) Normal - Pivot turns safely within 3 seconds and stops quickly with no loss of balance  6.   STEP OVER OBSTACLE Instructions: Begin walking at your normal speed. When you come to the shoe box, step over it, not around it, and keep walking. (2) Mild impairment - Is able to step over one shoe box (11.43 cm [4.5 in] total height) without changing gait speed; no evidence of imbalance.  7.   GAIT WITH NARROW BASE OF SUPPORT Instructions: Walk on the floor with arms folded across the chest, feet aligned heel to toe in tandem for a distance of 3.6 m [12 ft]. The number of steps taken in a straight line  are counted for a maximum of 10 steps. (0) Severe impairment - Ambulates less than 4 steps heel to toe or cannot perform without assistance.  8.   GAIT WITH EYES CLOSED Instructions: Walk at your normal speed from here to the next mark (6 m [20 ft]) with your eyes closed. (0) Severe impairment - Cannot walk 6 m (20 ft) without assistance, severe gait deviations or imbalance, deviates greater than 38.1 cm (15 in) outside 30.48-cm (12-in) walkway width or will not attempt task. 8.28 seconds with significant R veer.  9.   AMBULATING BACKWARDS Instructions: Walk backwards until I tell you to stop (2) Mild impairment - Walks 6 m (20 ft), uses assistive device, slower speed, mild gait deviations, deviates 15.24 -25.4 cm (6 -10 in) outside 30.48-cm (12-in) walkway width. 12.68 seconds  10. STEPS Instructions: Walk up these stairs as you would at home (ie, using the rail if necessary). At the top turn around and walk down. (2) Mild impairment-Alternating feet, must use rail.  Total 18/30   Interpretation of scores: Non-Specific Older Adults Cutoff Score: <=22/30 = risk of falls Parkinsons Disease Cutoff score <15/30= fall risk (Hoehn & Yahr 1-4)  Minimally Clinically Important Difference (  MCID)  Stroke (acute, subacute, and chronic) = MDC: 4.2 points Vestibular (acute) = MDC: 6 points Community Dwelling Older Adults =  MCID: 4 points Parkinsons Disease  =  MDC: 4.3 points  (Academy of Neurologic Physical Therapy (nd). Functional Gait Assessment. Retrieved from https://www.neuropt.org/docs/default-source/cpgs/core-outcome-measures/function-gait-assessment-pocket-guide-proof9-(2).pdf?sfvrsn=b91f35043_0.)   PATIENT SURVEYS:  TBA                                                                                                                              TREATMENT DATE: 05/18/2024  Self Care: BP and HR taken in sitting at rest beginning of session (see below for details). Vitals:   05/18/24 1235   BP: 118/61  Pulse: 85   Therapeutic Exercise: SciFit multi-peaks up to level 5 for 8 minutes using BLEs for LE strengthening, dynamic cardiovascular warmup and increased amplitude of stepping. RPE of 7/10 following activity.  Average stride length 10.8 inches.  Updated HEP: Tandem Walking with Counter Support  x8 feet (x6 reps) Backward Tandem Walking with Counter Support  x8 feet (x6 reps) Standing Single Leg Stance with Counter Support  x2 sets with 30 second hold B LE's  Therapeutic Activity: Standing activity at ballet bars: Toe tap to gum drop retro: x10 reps B LE's x2 sets Notes: B UE support needed for balance; vc's and demo for technique  PATIENT EDUCATION: Education details: Updated HEP. Person educated: Patient Education method: Explanation and Verbal cues Education comprehension: verbalized understanding, verbal cues required, and needs further education  HOME EXERCISE PROGRAM: Access Code: [email or web address] URL: [email or web address] Date: 05/18/2024 Prepared by: Damien Caulk  Exercises - Standing Tandem Balance with Counter Support  - 1 x daily - 5 x weekly - 2-3 sets - 10 reps - Mini Squat with Counter Support  - 1 x daily - 5 x weekly - 2-3 sets - 10 reps - Heel Raises with Counter Support  - 1 x daily - 5 x weekly - 2-3 sets - 10 reps - Seated Hip Abduction/External Rotation With Resistance at Thighs  - 1 x daily - 5 x weekly - 2-3 sets - 10 reps - Tandem Walking with Counter Support  - 1 x daily - 5 x weekly - 1 sets - 6 reps - Backward Tandem Walking with Counter Support  - 1 x daily - 5 x weekly - 1 sets - 6 reps - Standing Single Leg Stance with Counter Support  - 1 x daily - 5 x weekly - 2-3 sets - 30 second hold  GOALS: Goals reviewed with patient? Yes  SHORT TERM GOALS: Target date: 05/02/2024  Pt will be independent with initial HEP in order to improve strength and balance in order to decrease fall risk and improve function at home for  ADL's. Baseline:  Issued Goal status: MET  2.  Assess FGA and update LTG as needed. Baseline: 18/30 04/16/24 Goal status: MET  3.  Assess and update LTG as needed. Baseline: 1096 feet on 04/16/24 Goal  status: MET  4.  Pt will increase by at least 0.13 m/s in order to demonstrate clinically significant improvement in community ambulation.  Baseline: 0.87 m/sec; 1.04 m/sec 04/30/24 Goal status: MET  LONG TERM GOALS: Target date: 05/30/2024  Pt will be independent with final HEP in order to improve strength and balance in order to decrease fall risk and improve function at home for ADL's. Baseline:  Goal status: INITIAL  2.  Patient will increase Functional Gait Assessment score to > or equal to 22/30 as to reduce fall risk and improve dynamic gait safety with community ambulation. Baseline: 18/30 04/16/24 Goal status: INITIAL  3.  Patient will tolerate 10 seconds of single leg stance B LE's without loss of balance to improve ability to get in and out of shower safely. Baseline: R LE 5.10 seconds; L LE unable to perform without assist for balance Goal status: INITIAL  4.  Pt will increase by at least 36m (113ft) in order to demonstrate clinically significant improvement in cardiopulmonary endurance and community ambulation. Baseline: 1096 feet on 04/16/24 Goal status: INITIAL   ASSESSMENT:  CLINICAL IMPRESSION: Patient was seen today for physical therapy treatment to address strength, HEP, and balance.  Focused session on LE strengthening, updating HEP (focusing on balance), and single leg balance activity to improve balance.  Patient continues to be limited by single leg/higher level balance.  They demonstrate appropriate understanding of updated HEP.  They would continue to benefit from skilled PT to address impairments as noted and progress towards long term goals.  OBJECTIVE IMPAIRMENTS: Abnormal gait, cardiopulmonary status limiting activity, decreased activity  tolerance, decreased balance, decreased coordination, decreased endurance, decreased knowledge of condition, decreased knowledge of use of DME, decreased mobility, difficulty walking, decreased strength, improper body mechanics, postural dysfunction, and pain.   ACTIVITY LIMITATIONS: carrying, lifting, bending, squatting, stairs, bathing, toileting, dressing, and locomotion level  PARTICIPATION LIMITATIONS: meal prep, cleaning, laundry, driving, shopping, community activity, and yard work  PERSONAL FACTORS: Age, Past/current experiences, Transportation, and 1-2 comorbidities: a-fib and PVD are also affecting patient's functional outcome.   REHAB POTENTIAL: Good  CLINICAL DECISION MAKING: Evolving/moderate complexity  EVALUATION COMPLEXITY: Moderate  PLAN:  PT FREQUENCY: 2x/week  PT DURATION: 8 weeks  PLANNED INTERVENTIONS: [id number]- PT Re-evaluation, 97750- Physical Performance Testing, 97110-Therapeutic exercises, 97530- Therapeutic activity, [id number]- Neuromuscular re-education, 97535- Self Care, [id number]- Manual therapy, [id number]- Gait training, [id number]- Orthotic Initial, [phone]- Orthotic/Prosthetic subsequent, [phone]- Aquatic Therapy, [phone]- Electrical stimulation (manual), [id number]- Ultrasound, [phone] (1-2 muscles), [id number] (3+ muscles)- Dry Needling, Patient/Family education, Balance training, Stair training, Taping, Joint mobilization, Spinal mobilization, Scar mobilization, Compression bandaging, DME instructions, Cryotherapy, Moist heat, and Biofeedback  PLAN FOR NEXT SESSION: 10th visit progress note due.  Check on updated HEP.  LE strengthening; LE coordination; balance; aerobic endurance; single leg balance.   Damien Caulk, PT 05/18/2024, 4:01 PM        "

## 2024-05-21 ENCOUNTER — Encounter: Payer: Self-pay | Admitting: Physical Therapy

## 2024-05-21 ENCOUNTER — Ambulatory Visit

## 2024-05-21 ENCOUNTER — Ambulatory Visit: Admitting: Physical Therapy

## 2024-05-21 VITALS — BP 143/68 | HR 74

## 2024-05-21 DIAGNOSIS — R2689 Other abnormalities of gait and mobility: Secondary | ICD-10-CM | POA: Diagnosis not present

## 2024-05-21 DIAGNOSIS — M6281 Muscle weakness (generalized): Secondary | ICD-10-CM

## 2024-05-21 DIAGNOSIS — R278 Other lack of coordination: Secondary | ICD-10-CM

## 2024-05-21 NOTE — Therapy (Signed)
 " OUTPATIENT PHYSICAL THERAPY NEURO TREATMENT--10th VISIT PROGRESS NOTE  Physical Therapy Progress Note   Dates of Reporting Period: 04/04/2024 - 05/21/2024  See Note below for Objective Data and Assessment of Progress/Goals.  Thank you for the referral of this patient. Terri Alexander, PT    Patient Name: Terri Alexander MRN: [id number] DOB:17-Apr-1951, 74 y.o., female Today's Date: 05/21/2024   PCP: Terri Alexander REFERRING PROVIDER: [name], NP  END OF SESSION:  PT End of Session - 05/21/24 1231     Visit Number 10    Number of Visits 17   16 visits plus eval   Date for Recertification  06/15/24    Authorization Type Medicare A&B/[employer]HC    Progress Note Due on Visit 10   Follow Medicare Guidelines   PT Start Time 1230    PT Stop Time 1313    PT Time Calculation (min) 43 min    Equipment Utilized During Treatment Gait belt    Activity Tolerance Patient tolerated treatment well    Behavior During Therapy WFL for tasks assessed/performed          Past Medical History:  Diagnosis Date   Adenomatous colon polyp    Allergy    Anemia    as a child, no current problem   Arrhythmia    Atrial fibrillation (HCC)    Blood transfusion without reported diagnosis @ 9 months   9 months old   Cataract    bilateral - Alexander just watching    Dysrhythmia    A. Fib   GERD (gastroesophageal reflux disease)    History of hiatal hernia    Hyperlipidemia    Osteoporosis 04/15/2016   Peripheral vascular disease    Type B Aortic Dissection   Skin cancer 2013   Skin cancer- Nose, face   Stroke [facility])    Past Surgical History:  Procedure Laterality Date   ANGIOPLASTY, ARTERY, SUBCLAVIAN, ENDOVASCULAR, WITH STENT INSERTION Left 03/19/2024   Procedure: LEFT AXILLARY ARTERY STENT INSERTION;  Surgeon: Terri Fonda BRAVO, Alexander;  Location: [facility] OR;  Service: Vascular;  Laterality: Left;   ATRIAL FIBRILLATION ABLATION N/A 02/16/2023   Procedure: ATRIAL FIBRILLATION ABLATION;   Surgeon: Terri Eulas BRAVO, Alexander;  Location: [facility] INVASIVE CV LAB;  Service: Cardiovascular;  Laterality: N/A;   CARDIOVERSION N/A 09/09/2022   Procedure: CARDIOVERSION;  Surgeon: Terri Ronal BRAVO, Alexander;  Location: [facility] INVASIVE CV LAB;  Service: Cardiovascular;  Laterality: N/A;   CESAREAN SECTION  1981   x 1   CESAREAN SECTION     COLONOSCOPY  2016   hx polyp, tics, sigmoid colon mod tortuous Dr Terri Alexander,    EMBOLECTOMY Left 03/19/2024   Procedure: EMBOLECTOMY OF LEFT BRACHIAL ARTERY;  Surgeon: Terri Fonda BRAVO, Alexander;  Location: [facility] OR;  Service: Vascular;  Laterality: Left;   EYE SURGERY Bilateral    retina repair x 2   FEMORAL ARTERY EXPLORATION Left 03/19/2024   Procedure: EXPOSURE OF BRACHIAL ARTERY WITH STENT INSERTION;  Surgeon: Terri Fonda BRAVO, Alexander;  Location: [facility] OR;  Service: Vascular;  Laterality: Left;   LAPAROTOMY  1979   removal of ovarian cyst   nissan fundiplication  05/17/1997   REPAIR OF COMPLEX TRACTION RETINAL DETACHMENT Bilateral 01/2019   SVT ABLATION N/A 12/09/2023   Procedure: SVT ABLATION;  Surgeon: Terri Eulas BRAVO, Alexander;  Location: [facility] INVASIVE CV LAB;  Service: Cardiovascular;  Laterality: N/A;   THORACIC AORTIC ENDOVASCULAR STENT GRAFT Left 03/19/2024   Procedure: INSERTION, ENDOVASCULAR STENT GRAFT,  AORTA, THORACIC; STENTING X2 OF LEFT SUBCLAVIAN ARTERY;  Surgeon: Terri Fonda BRAVO, Alexander;  Location: [facility] OR;  Service: Vascular;  Laterality: Left;   ULTRASOUND GUIDANCE FOR VASCULAR ACCESS Bilateral 03/19/2024   Procedure: ULTRASOUND GUIDANCE, FOR VASCULAR ACCESS OF BILATERAL FEMORAL AND LEFT RADIAL ARTERY;  Surgeon: Terri Fonda BRAVO, Alexander;  Location: [facility] OR;  Service: Vascular;  Laterality: Bilateral;   Patient Active Problem List   Diagnosis Date Noted   CVA (cerebral vascular accident) (HCC) 03/23/2024   S/P AAA (abdominal aortic aneurysm) repair 03/19/2024   Thoracic aortic aneurysm, without rupture, unspecified 03/19/2024   Persistent atrial fibrillation (HCC) 08/25/2022    Hypercoagulable state due to persistent atrial fibrillation (HCC) 08/25/2022   Atrial fibrillation with RVR (HCC) 08/18/2022   Aortic dissection (HCC) 08/17/2022   Aortic dissection distal to left subclavian (HCC) 08/17/2022   Diverticulosis 04/19/2022   Diverticulosis of sigmoid colon 04/19/2022   Palpitations 06/24/2021   Premature atrial contraction 06/24/2021   PVC (premature ventricular contraction) 06/24/2021   Abnormal EKG 06/24/2021   Dyslipidemia 03/24/2018   Postmenopausal estrogen deficiency 03/24/2018   Squamous cell carcinoma 08/10/2017   HSV-1 (herpes simplex virus 1) infection 03/16/2017   Osteoporosis 04/15/2016   Vulvar atrophy 11/07/2014   Abnormal Pap smear of cervix 11/07/2014    ONSET DATE: 03/28/2024 (date of referral)  REFERRING DIAG: I63.10 (ICD-10-CM) - Cerebrovascular accident (CVA) due to embolism of precerebral artery (HCC)  THERAPY DIAG:  Other abnormalities of gait and mobility  Muscle weakness (generalized)  Other lack of coordination  Rationale for Evaluation and Treatment: Rehabilitation  SUBJECTIVE: Likes to be called [name].                                                                                                                                                                                           SUBJECTIVE STATEMENT: Busy weekend; cleaned house; got tree out of house; washed clothes.  Bumping into walls at times but not new (pt reports she has done that for years).  No recent falls.  No acute changes.  Had a good weekend with her shoulders (didn't bother her much). Pt accompanied by: self  PERTINENT HISTORY: Multifocal CVA's s/p TEVAR with subclavian branch endoprosthesis 03/19/24 (LUE and B groin incisions) to repair thoracic/abdominal aortic aneurysm (chronic type B aortic dissection with aneurysmal degeneration of the descending aorta).  Case complicated by dissection of the left subclavian artery with need for stenting of the  axillary and brachial arteries to reestablish flow to left upper extremity. Pt with episode of transient aphasia that lasted a couple of minutes 03/20/24; episode of unsteadiness while walking 03/21/24.  03/21/24  imaging: Small acute infarcts: B frontal lobes, L occipital lobe, and L cerebellum.  Inpatient rehab stay 03/23/24-03/30/24.  PMH also includes anemia, a-fib, arrhythmia, h/o hiatal hernia, PVD (type B aortic dissection), skin CA (nose, face).  PAIN: 0/10 R LBP Are you having pain? Yes: NPRS scale: 0/10 Pain location: B shoulders Pain description: muscle spasms R side Aggravating factors: unknown Relieving factors: heat; salon pass (patches)  PRECAUTIONS: Fall; no lifting anything heavy with L UE (less than gallon of milk) per pt  RED FLAGS: None   WEIGHT BEARING RESTRICTIONS: No  FALLS: Has patient fallen in last 6 months? No  LIVING ENVIRONMENT: Lives with: lives with their family (husband) Lives in: House/apartment (townhouse; 1 level) Stairs: Yes: External: 1 steps; none Has following equipment at home: Single point cane, shower chair, and Grab bars  PLOF: Independent prior to surgery.  CGA/SBA ambulation without AD in IP rehab.  PATIENT GOALS: Get strength back.  Improve balance.  OBJECTIVE:  Note: Objective measures were completed at Evaluation unless otherwise noted.  DIAGNOSTIC FINDINGS:  Per MRI Brain 03/21/24: 1. Small acute infarcts in the bilateral frontal lobes, left occipital lobe, and left cerebellum. Given involvement of multiple vascular territories, consider embolic etiology.  COGNITION: Overall cognitive status: Within functional limits for tasks assessed   SENSATION: WFL (light touch)  COORDINATION: Good B heel to shin coordination in sitting.  Difficulty with rapid alternating toe taps in sitting B.  EDEMA:  General LE swelling (has had since surgery)  MUSCLE TONE: WNL  POSTURE: rounded shoulders and forward head  LOWER EXTREMITY MMT:     MMT Right Eval Left Eval  Hip flexion 4/5 4/5  Hip extension    Hip abduction    Hip adduction    Hip internal rotation    Hip external rotation    Knee flexion 4/5 4/5  Knee extension 4+/5 4+/5  Ankle dorsiflexion 4+/5 4+/5  Ankle plantarflexion At least 3/5 AROM At least 3/5 AROM  Ankle inversion    Ankle eversion    (Blank rows = not tested)  BED MOBILITY:  Independent  TRANSFERS: Sit to stand: Modified independence  Assistive device utilized: None     Stand to sit: Modified independence  Assistive device utilized: None      GAIT: Findings: Gait Characteristics: step through gait pattern, Distance walked: clinic distances, Assistive device utilized:None, Level of assistance: SBA, and Comments: no loss of balance ambulating without AD use; pt appearing fatigued though after  FUNCTIONAL TESTS:  5 times sit to stand: 12.09 seconds 10 meter walk test: 0.87 m/sec (11.38 seconds); HR 104 bpm post activity; 1.04 m/sec (9.59 seconds) 04/30/24 SLS: R LE 5.10 seconds; L LE unable to perform without assist for balance (52/56 on BERG 03/29/24 in inpatient rehab) FGA: 18/30 (no AD use) 04/16/24 : 1096 feet (no AD use) 04/16/24; 1289 feet 05/21/24  FUNCTIONAL GAIT ASSESSMENT  Date: 04/16/24 Score  GAIT LEVEL SURFACE Instructions: Walk at your normal speed from here to the next mark (6 m) [20 ft]. (2) Mild impairment - Walks 6 m (20 ft) in less than 7 seconds but greater than 5.5 seconds, uses assistive device, slower speed, mild gait deviations, or deviates 15.24 -25.4 cm (6 -10 in) outside of the 30.48-cm (12-in) walkway width. 6.09 seconds  2.   CHANGE IN GAIT SPEED Instructions: Begin walking at your normal pace (for 1.5 m [5 ft]). When I tell you go, walk as fast as you can (for 1.5 m [5 ft]). When  I tell you slow, walk as slowly as you can (for 1.5 m [5 ft]. (2) Mild impairment - Is able to change speed but demonstrates mild gait deviations, deviates 15.24 -25.4 cm (6  -10 in) outside of the 30.48-cm (12-in) walkway width, or no gait deviations but unable to achieve a significant change in velocity, or uses an assistive device  3.    GAIT WITH HORIZONTAL HEAD TURNS Instructions: Walk from here to the next mark 6 m (20 ft) away. Begin walking at your normal pace. Keep walking straight; after 3 steps, turn your head to the right and keep walking straight while looking to the right. After 3 more steps, turn your head to the left and keep walking straight while looking left. Continue alternating looking right and left. (3) Normal - Performs head turns smoothly with no change in gait. Deviates no more than 15.24 cm (6 in) outside 30.48-cm (12-in) walkway width.  4.   GAIT WITH VERTICAL HEAD TURNS Instructions: Walk from here to the next mark (6 m [20 ft]). Begin walking at your normal pace. Keep walking straight; after 3 steps, tip your head up and keep walking straight while looking up. After 3 more steps, tip your head down, keep walking straight while looking down. Continue  alternating looking up and down every 3 steps until you have completed 2 repetitions in each direction. (2) Mild impairment - Performs task with slight change in gait velocity (eg, minor disruption to smooth gait path), deviates 15.24 -25.4 cm (6 -10 in) outside 30.48-cm (12-in) walkway width or uses assistive device.  5.  GAIT AND PIVOT TURN Instructions: Begin with walking at your normal pace. When I tell you, turn and stop, turn as quickly as you can to face the opposite direction and stop. (3) Normal - Pivot turns safely within 3 seconds and stops quickly with no loss of balance  6.   STEP OVER OBSTACLE Instructions: Begin walking at your normal speed. When you come to the shoe box, step over it, not around it, and keep walking. (2) Mild impairment - Is able to step over one shoe box (11.43 cm [4.5 in] total height) without changing gait speed; no evidence of imbalance.  7.   GAIT WITH NARROW BASE  OF SUPPORT Instructions: Walk on the floor with arms folded across the chest, feet aligned heel to toe in tandem for a distance of 3.6 m [12 ft]. The number of steps taken in a straight line are counted for a maximum of 10 steps. (0) Severe impairment - Ambulates less than 4 steps heel to toe or cannot perform without assistance.  8.   GAIT WITH EYES CLOSED Instructions: Walk at your normal speed from here to the next mark (6 m [20 ft]) with your eyes closed. (0) Severe impairment - Cannot walk 6 m (20 ft) without assistance, severe gait deviations or imbalance, deviates greater than 38.1 cm (15 in) outside 30.48-cm (12-in) walkway width or will not attempt task. 8.28 seconds with significant R veer.  9.   AMBULATING BACKWARDS Instructions: Walk backwards until I tell you to stop (2) Mild impairment - Walks 6 m (20 ft), uses assistive device, slower speed, mild gait deviations, deviates 15.24 -25.4 cm (6 -10 in) outside 30.48-cm (12-in) walkway width. 12.68 seconds  10. STEPS Instructions: Walk up these stairs as you would at home (ie, using the rail if necessary). At the top turn around and walk down. (2) Mild impairment-Alternating feet, must use rail.  Total 18/30   Interpretation of scores: Non-Specific Older Adults Cutoff Score: <=22/30 = risk of falls Parkinsons Disease Cutoff score <15/30= fall risk (Hoehn & Yahr 1-4)  Minimally Clinically Important Difference (MCID)  Stroke (acute, subacute, and chronic) = MDC: 4.2 points Vestibular (acute) = MDC: 6 points Community Dwelling Older Adults =  MCID: 4 points Parkinsons Disease  =  MDC: 4.3 points  (Academy of Neu[employer]ic Physical Therapy (nd). Functional Gait Assessment. Retrieved from https://www.neuropt.org/docs/default-source/cpgs/core-outcome-measures/function-gait-assessment-pocket-guide-proof9-(2).pdf?sfvrsn=b38f35043_0.)   PATIENT SURVEYS:  TBA                                                                                                                               TREATMENT DATE: 05/21/2024  Self Care: BP and HR taken in sitting at rest beginning of session (see below for details). Vitals:   05/21/24 1234  BP: (!) 143/68  Pulse: 74   Therapeutic Activity: : 1289 feet (HR 84 bpm post activity) Standing activity in // bars: 9 feet foam balance beam: Tandem walking (heel to toe) x6 reps Notes: intermittent UE support for balance; pt did better with balance last 2 reps with visual feedback via mirror Side stepping (R then L = 1 rep): x3 reps Notes: intermittent UE support for balance Mini squats on wobble board (lateral): x10 reps x2 sets Notes: CGA; initial vc's for technique and keeping R LE neutral (instead of internal rotation of R LE during mini-squat)--improved with visual cues from mirror feedback  PATIENT EDUCATION: Education details: Continue HEP.  Plan for 2 more PT visits (and then anticipate graduation from therapy)--pt in agreement with plan. Person educated: Patient Education method: Explanation and Verbal cues Education comprehension: verbalized understanding, verbal cues required, and needs further education  HOME EXERCISE PROGRAM: Access Code: [email or web address] URL: [email or web address] Date: 05/18/2024 Prepared by: Terri Alexander  Exercises - Standing Tandem Balance with Counter Support  - 1 x daily - 5 x weekly - 2-3 sets - 10 reps - Mini Squat with Counter Support  - 1 x daily - 5 x weekly - 2-3 sets - 10 reps - Heel Raises with Counter Support  - 1 x daily - 5 x weekly - 2-3 sets - 10 reps - Seated Hip Abduction/External Rotation With Resistance at Thighs  - 1 x daily - 5 x weekly - 2-3 sets - 10 reps - Tandem Walking with Counter Support  - 1 x daily - 5 x weekly - 1 sets - 6 reps - Backward Tandem Walking with Counter Support  - 1 x daily - 5 x weekly - 1 sets - 6 reps - Standing Single Leg Stance with Counter Support  - 1 x daily - 5 x weekly - 2-3 sets - 30 second  hold  GOALS: Goals reviewed with patient? Yes  SHORT TERM GOALS: Target date: 05/02/2024  Pt will be independent with initial HEP in order to improve strength and balance in order to decrease fall risk and improve function  at home for ADL's. Baseline:  Issued Goal status: MET  2.  Assess FGA and update LTG as needed. Baseline: 18/30 04/16/24 Goal status: MET  3.  Assess and update LTG as needed. Baseline: 1096 feet on 04/16/24 Goal status: MET  4.  Pt will increase by at least 0.13 m/s in order to demonstrate clinically significant improvement in community ambulation.  Baseline: 0.87 m/sec; 1.04 m/sec 04/30/24 Goal status: MET  LONG TERM GOALS: Target date: 05/30/2024  Pt will be independent with final HEP in order to improve strength and balance in order to decrease fall risk and improve function at home for ADL's. Baseline: HEP issued Goal status: INITIAL  2.  Patient will increase Functional Gait Assessment score to > or equal to 22/30 as to reduce fall risk and improve dynamic gait safety with community ambulation. Baseline: 18/30 04/16/24 Goal status: INITIAL  3.  Patient will tolerate 10 seconds of single leg stance B LE's without loss of balance to improve ability to get in and out of shower safely. Baseline: R LE 5.10 seconds; L LE unable to perform without assist for balance Goal status: INITIAL  4.  Pt will increase by at least 56m (123ft) in order to demonstrate clinically significant improvement in cardiopulmonary endurance and community ambulation. Baseline: 1096 feet on 04/16/24; 1289 feet 05/21/24 Goal status: MET (improved by 193 feet)   ASSESSMENT:  CLINICAL IMPRESSION: Patient was seen today for physical therapy treatment to address outcome measure and balance.  This is pt's 10th visit progress note.  Focused session on assessing and performing higher level balance activities (on compliant surface and wobble board).  Pt scored 1289 feet on  ; improved from 1096 feet on 04/16/24 (LTG #4 met).  Patient continues to be limited by higher level balance.  They demonstrate improvement in cardiopulmonary endurance and LE strength since initial evaluation.  They would continue to benefit from skilled PT to address impairments as noted and progress towards long term goals.  Pt has been progressing well with therapy.  Plan for 2 more PT visits (and then anticipate graduation from physical therapy).  OBJECTIVE IMPAIRMENTS: Abnormal gait, cardiopulmonary status limiting activity, decreased activity tolerance, decreased balance, decreased coordination, decreased endurance, decreased knowledge of condition, decreased knowledge of use of DME, decreased mobility, difficulty walking, decreased strength, improper body mechanics, postural dysfunction, and pain.   ACTIVITY LIMITATIONS: carrying, lifting, bending, squatting, stairs, bathing, toileting, dressing, and locomotion level  PARTICIPATION LIMITATIONS: meal prep, cleaning, laundry, driving, shopping, community activity, and yard work  PERSONAL FACTORS: Age, Past/current experiences, Transportation, and 1-2 comorbidities: a-fib and PVD are also affecting patient's functional outcome.   REHAB POTENTIAL: Good  CLINICAL DECISION MAKING: Evolving/moderate complexity  EVALUATION COMPLEXITY: Moderate  PLAN:  PT FREQUENCY: 2x/week  PT DURATION: 8 weeks  PLANNED INTERVENTIONS: [id number]- PT Re-evaluation, 97750- Physical Performance Testing, 97110-Therapeutic exercises, 97530- Therapeutic activity, [id number]- Neuromuscular re-education, 97535- Self Care, [id number]- Manual therapy, [id number]- Gait training, [phone]- Orthotic Initial, [phone]- Orthotic/Prosthetic subsequent, [phone]- Aquatic Therapy, [phone]- Electrical stimulation (manual), [id number]- Ultrasound, [phone] (1-2 muscles), [id number] (3+ muscles)- Dry Needling, Patient/Family education, Balance training, Stair training, Taping, Joint mobilization, Spinal mobilization, Scar  mobilization, Compression bandaging, DME instructions, Cryotherapy, Moist heat, and Biofeedback  PLAN FOR NEXT SESSION: Higher level balance; SLS.  Check on updated HEP.  LE strengthening; LE coordination; balance; aerobic endurance; single leg balance.   Terri Alexander, PT 05/21/2024, 5:14 PM        "

## 2024-05-22 ENCOUNTER — Other Ambulatory Visit

## 2024-05-22 DIAGNOSIS — I499 Cardiac arrhythmia, unspecified: Secondary | ICD-10-CM

## 2024-05-22 DIAGNOSIS — I4819 Other persistent atrial fibrillation: Secondary | ICD-10-CM

## 2024-05-22 DIAGNOSIS — Z8679 Personal history of other diseases of the circulatory system: Secondary | ICD-10-CM

## 2024-05-22 DIAGNOSIS — I6310 Cerebral infarction due to embolism of unspecified precerebral artery: Secondary | ICD-10-CM

## 2024-05-22 DIAGNOSIS — D509 Iron deficiency anemia, unspecified: Secondary | ICD-10-CM

## 2024-05-22 DIAGNOSIS — I4891 Unspecified atrial fibrillation: Secondary | ICD-10-CM

## 2024-05-23 LAB — CBC WITH DIFFERENTIAL/PLATELET
Absolute Lymphocytes: 1722 {cells}/uL (ref 850–3900)
Absolute Monocytes: 916 {cells}/uL (ref 200–950)
Basophils Absolute: 59 {cells}/uL (ref 0–200)
Basophils Relative: 0.7 %
Eosinophils Absolute: 8 {cells}/uL — ABNORMAL LOW (ref 15–500)
Eosinophils Relative: 0.1 %
HCT: 40.4 % (ref 35.9–46.0)
Hemoglobin: 12.8 g/dL (ref 11.7–15.5)
MCH: 29.3 pg (ref 27.0–33.0)
MCHC: 31.7 g/dL (ref 31.6–35.4)
MCV: 92.4 fL (ref 81.4–101.7)
MPV: 10.2 fL (ref 7.5–12.5)
Monocytes Relative: 10.9 %
Neutro Abs: 5695 {cells}/uL (ref 1500–7800)
Neutrophils Relative %: 67.8 %
Platelets: 368 Thousand/uL (ref 140–400)
RBC: 4.37 Million/uL (ref 3.80–5.10)
RDW: 13.3 % (ref 11.0–15.0)
Total Lymphocyte: 20.5 %
WBC: 8.4 Thousand/uL (ref 3.8–10.8)

## 2024-05-23 LAB — COMPLETE METABOLIC PANEL WITHOUT GFR
AG Ratio: 1.5 (calc) (ref 1.0–2.5)
ALT: 16 U/L (ref 6–29)
AST: 19 U/L (ref 10–35)
Albumin: 4.3 g/dL (ref 3.6–5.1)
Alkaline phosphatase (APISO): 83 U/L (ref 37–153)
BUN: 17 mg/dL (ref 7–25)
CO2: 25 mmol/L (ref 20–32)
Calcium: 9.7 mg/dL (ref 8.6–10.4)
Chloride: 104 mmol/L (ref 98–110)
Creat: 0.7 mg/dL (ref 0.60–1.00)
Globulin: 2.8 g/dL (ref 1.9–3.7)
Glucose, Bld: 96 mg/dL (ref 65–99)
Potassium: 4.9 mmol/L (ref 3.5–5.3)
Sodium: 143 mmol/L (ref 135–146)
Total Bilirubin: 0.4 mg/dL (ref 0.2–1.2)
Total Protein: 7.1 g/dL (ref 6.1–8.1)

## 2024-05-23 LAB — LIPID PANEL
Cholesterol: 154 mg/dL
HDL: 70 mg/dL
LDL Cholesterol (Calc): 64 mg/dL
Non-HDL Cholesterol (Calc): 84 mg/dL
Total CHOL/HDL Ratio: 2.2 (calc)
Triglycerides: 115 mg/dL

## 2024-05-24 ENCOUNTER — Ambulatory Visit: Payer: Self-pay | Admitting: Family Medicine

## 2024-05-25 ENCOUNTER — Encounter: Payer: Self-pay | Admitting: Physical Therapy

## 2024-05-25 ENCOUNTER — Ambulatory Visit: Admitting: Physical Therapy

## 2024-05-25 ENCOUNTER — Ambulatory Visit

## 2024-05-25 VITALS — BP 157/67 | HR 66

## 2024-05-25 DIAGNOSIS — R2689 Other abnormalities of gait and mobility: Secondary | ICD-10-CM

## 2024-05-25 DIAGNOSIS — M6281 Muscle weakness (generalized): Secondary | ICD-10-CM

## 2024-05-25 DIAGNOSIS — R278 Other lack of coordination: Secondary | ICD-10-CM

## 2024-05-25 NOTE — Therapy (Signed)
 " OUTPATIENT PHYSICAL THERAPY NEURO TREATMENT  Patient Name: Terri Alexander MRN: [id number] DOB:10-Jan-1951, 74 y.o., female Today's Date: 05/25/2024   PCP: Terri Butler DASEN, MD REFERRING PROVIDER: [name], NP  END OF SESSION:  PT End of Session - 05/25/24 1232     Visit Number 11    Number of Visits 17   16 visits plus eval   Date for Recertification  06/15/24    Authorization Type Medicare A&B/[employer]HC    Progress Note Due on Visit 10   Follow Medicare Guidelines   PT Start Time 1230    PT Stop Time 1314    PT Time Calculation (min) 44 min    Equipment Utilized During Treatment Gait belt    Activity Tolerance Patient tolerated treatment well    Behavior During Therapy WFL for tasks assessed/performed          Past Medical History:  Diagnosis Date   Adenomatous colon polyp    Allergy    Anemia    as a child, no current problem   Arrhythmia    Atrial fibrillation (HCC)    Blood transfusion without reported diagnosis @ 9 months   9 months old   Cataract    bilateral - MD just watching    Dysrhythmia    A. Fib   GERD (gastroesophageal reflux disease)    History of hiatal hernia    Hyperlipidemia    Osteoporosis 04/15/2016   Peripheral vascular disease    Type B Aortic Dissection   Skin cancer 2013   Skin cancer- Nose, face   Stroke [facility])    Past Surgical History:  Procedure Laterality Date   ANGIOPLASTY, ARTERY, SUBCLAVIAN, ENDOVASCULAR, WITH STENT INSERTION Left 03/19/2024   Procedure: LEFT AXILLARY ARTERY STENT INSERTION;  Surgeon: Terri Fonda BRAVO, MD;  Location: [facility] OR;  Service: Vascular;  Laterality: Left;   ATRIAL FIBRILLATION ABLATION N/A 02/16/2023   Procedure: ATRIAL FIBRILLATION ABLATION;  Surgeon: Terri Eulas BRAVO, MD;  Location: [facility] INVASIVE CV LAB;  Service: Cardiovascular;  Laterality: N/A;   CARDIOVERSION N/A 09/09/2022   Procedure: CARDIOVERSION;  Surgeon: Terri Ronal BRAVO, MD;  Location: [facility] INVASIVE CV LAB;  Service: Cardiovascular;   Laterality: N/A;   CESAREAN SECTION  1981   x 1   CESAREAN SECTION     COLONOSCOPY  2016   hx polyp, tics, sigmoid colon mod tortuous Dr Terri Alexander,    EMBOLECTOMY Left 03/19/2024   Procedure: EMBOLECTOMY OF LEFT BRACHIAL ARTERY;  Surgeon: Terri Fonda BRAVO, MD;  Location: [facility] OR;  Service: Vascular;  Laterality: Left;   EYE SURGERY Bilateral    retina repair x 2   FEMORAL ARTERY EXPLORATION Left 03/19/2024   Procedure: EXPOSURE OF BRACHIAL ARTERY WITH STENT INSERTION;  Surgeon: Terri Fonda BRAVO, MD;  Location: [facility] OR;  Service: Vascular;  Laterality: Left;   LAPAROTOMY  1979   removal of ovarian cyst   nissan fundiplication  05/17/1997   REPAIR OF COMPLEX TRACTION RETINAL DETACHMENT Bilateral 01/2019   SVT ABLATION N/A 12/09/2023   Procedure: SVT ABLATION;  Surgeon: Terri Eulas BRAVO, MD;  Location: [facility] INVASIVE CV LAB;  Service: Cardiovascular;  Laterality: N/A;   THORACIC AORTIC ENDOVASCULAR STENT GRAFT Left 03/19/2024   Procedure: INSERTION, ENDOVASCULAR STENT GRAFT, AORTA, THORACIC; STENTING X2 OF LEFT SUBCLAVIAN ARTERY;  Surgeon: Terri Fonda BRAVO, MD;  Location[facility] OR;  Service: Vascular;  Laterality: Left;   ULTRASOUND GUIDANCE FOR VASCULAR ACCESS Bilateral 03/19/2024   Procedure: ULTRASOUND GUIDANCE, FOR VASCULAR ACCESS OF  BILATERAL FEMORAL AND LEFT RADIAL ARTERY;  Surgeon: Terri Fonda BRAVO, MD;  Location: [facility] OR;  Service: Vascular;  Laterality: Bilateral;   Patient Active Problem List   Diagnosis Date Noted   CVA (cerebral vascular accident) (HCC) 03/23/2024   S/P AAA (abdominal aortic aneurysm) repair 03/19/2024   Thoracic aortic aneurysm, without rupture, unspecified 03/19/2024   Persistent atrial fibrillation (HCC) 08/25/2022   Hypercoagulable state due to persistent atrial fibrillation (HCC) 08/25/2022   Atrial fibrillation with RVR (HCC) 08/18/2022   Aortic dissection (HCC) 08/17/2022   Aortic dissection distal to left subclavian (HCC) 08/17/2022   Diverticulosis 04/19/2022    Diverticulosis of sigmoid colon 04/19/2022   Palpitations 06/24/2021   Premature atrial contraction 06/24/2021   PVC (premature ventricular contraction) 06/24/2021   Abnormal EKG 06/24/2021   Dyslipidemia 03/24/2018   Postmenopausal estrogen deficiency 03/24/2018   Squamous cell carcinoma 08/10/2017   HSV-1 (herpes simplex virus 1) infection 03/16/2017   Osteoporosis 04/15/2016   Vulvar atrophy 11/07/2014   Abnormal Pap smear of cervix 11/07/2014    ONSET DATE: 03/28/2024 (date of referral)  REFERRING DIAG: I63.10 (ICD-10-CM) - Cerebrovascular accident (CVA) due to embolism of precerebral artery (HCC)  THERAPY DIAG:  Other abnormalities of gait and mobility  Muscle weakness (generalized)  Other lack of coordination  Rationale for Evaluation and Treatment: Rehabilitation  SUBJECTIVE: Likes to be called [name].                                                                                                                                                                                           SUBJECTIVE STATEMENT: No recent falls.  Had labs drawn earlier this week; Hgb good and can stop taking iron supplement.  No acute changes. Pt accompanied by: self  PERTINENT HISTORY: Multifocal CVA's s/p TEVAR with subclavian branch endoprosthesis 03/19/24 (LUE and B groin incisions) to repair thoracic/abdominal aortic aneurysm (chronic type B aortic dissection with aneurysmal degeneration of the descending aorta).  Case complicated by dissection of the left subclavian artery with need for stenting of the axillary and brachial arteries to reestablish flow to left upper extremity. Pt with episode of transient aphasia that lasted a couple of minutes 03/20/24; episode of unsteadiness while walking 03/21/24.  03/21/24 imaging: Small acute infarcts: B frontal lobes, L occipital lobe, and L cerebellum.  Inpatient rehab stay 03/23/24-03/30/24.  PMH also includes anemia, a-fib, arrhythmia, h/o hiatal hernia,  PVD (type B aortic dissection), skin CA (nose, face).  PAIN: 0/10 R LBP Are you having pain? Yes: NPRS scale: 0/10 Pain location: B shoulders Pain description: muscle spasms R side Aggravating factors: unknown Relieving factors: heat; salon pass (  patches)  PRECAUTIONS: Fall; no lifting anything heavy with L UE (less than gallon of milk) per pt  RED FLAGS: None   WEIGHT BEARING RESTRICTIONS: No  FALLS: Has patient fallen in last 6 months? No  LIVING ENVIRONMENT: Lives with: lives with their family (husband) Lives in: House/apartment (townhouse; 1 level) Stairs: Yes: External: 1 steps; none Has following equipment at home: Single point cane, shower chair, and Grab bars  PLOF: Independent prior to surgery.  CGA/SBA ambulation without AD in IP rehab.  PATIENT GOALS: Get strength back.  Improve balance.  OBJECTIVE:  Note: Objective measures were completed at Evaluation unless otherwise noted.  DIAGNOSTIC FINDINGS:  Per MRI Brain 03/21/24: 1. Small acute infarcts in the bilateral frontal lobes, left occipital lobe, and left cerebellum. Given involvement of multiple vascular territories, consider embolic etiology.  COGNITION: Overall cognitive status: Within functional limits for tasks assessed   SENSATION: WFL (light touch)  COORDINATION: Good B heel to shin coordination in sitting.  Difficulty with rapid alternating toe taps in sitting B.  EDEMA:  General LE swelling (has had since surgery)  MUSCLE TONE: WNL  POSTURE: rounded shoulders and forward head  LOWER EXTREMITY MMT:    MMT Right Eval Left Eval  Hip flexion 4/5 4/5  Hip extension    Hip abduction    Hip adduction    Hip internal rotation    Hip external rotation    Knee flexion 4/5 4/5  Knee extension 4+/5 4+/5  Ankle dorsiflexion 4+/5 4+/5  Ankle plantarflexion At least 3/5 AROM At least 3/5 AROM  Ankle inversion    Ankle eversion    (Blank rows = not tested)  BED MOBILITY:   Independent  TRANSFERS: Sit to stand: Modified independence  Assistive device utilized: None     Stand to sit: Modified independence  Assistive device utilized: None      GAIT: Findings: Gait Characteristics: step through gait pattern, Distance walked: clinic distances, Assistive device utilized:None, Level of assistance: SBA, and Comments: no loss of balance ambulating without AD use; pt appearing fatigued though after  FUNCTIONAL TESTS:  5 times sit to stand: 12.09 seconds 10 meter walk test: 0.87 m/sec (11.38 seconds); HR 104 bpm post activity; 1.04 m/sec (9.59 seconds) 04/30/24 SLS: R LE 5.10 seconds; L LE unable to perform without assist for balance (52/56 on BERG 03/29/24 in inpatient rehab) FGA: 18/30 (no AD use) 04/16/24 : 1096 feet (no AD use) 04/16/24; 1289 feet 05/21/24  FUNCTIONAL GAIT ASSESSMENT  Date: 04/16/24 Score  GAIT LEVEL SURFACE Instructions: Walk at your normal speed from here to the next mark (6 m) [20 ft]. (2) Mild impairment - Walks 6 m (20 ft) in less than 7 seconds but greater than 5.5 seconds, uses assistive device, slower speed, mild gait deviations, or deviates 15.24 -25.4 cm (6 -10 in) outside of the 30.48-cm (12-in) walkway width. 6.09 seconds  2.   CHANGE IN GAIT SPEED Instructions: Begin walking at your normal pace (for 1.5 m [5 ft]). When I tell you go, walk as fast as you can (for 1.5 m [5 ft]). When I tell you slow, walk as slowly as you can (for 1.5 m [5 ft]. (2) Mild impairment - Is able to change speed but demonstrates mild gait deviations, deviates 15.24 -25.4 cm (6 -10 in) outside of the 30.48-cm (12-in) walkway width, or no gait deviations but unable to achieve a significant change in velocity, or uses an assistive device  3.    GAIT WITH  HORIZONTAL HEAD TURNS Instructions: Walk from here to the next mark 6 m (20 ft) away. Begin walking at your normal pace. Keep walking straight; after 3 steps, turn your head to the right and keep walking  straight while looking to the right. After 3 more steps, turn your head to the left and keep walking straight while looking left. Continue alternating looking right and left. (3) Normal - Performs head turns smoothly with no change in gait. Deviates no more than 15.24 cm (6 in) outside 30.48-cm (12-in) walkway width.  4.   GAIT WITH VERTICAL HEAD TURNS Instructions: Walk from here to the next mark (6 m [20 ft]). Begin walking at your normal pace. Keep walking straight; after 3 steps, tip your head up and keep walking straight while looking up. After 3 more steps, tip your head down, keep walking straight while looking down. Continue  alternating looking up and down every 3 steps until you have completed 2 repetitions in each direction. (2) Mild impairment - Performs task with slight change in gait velocity (eg, minor disruption to smooth gait path), deviates 15.24 -25.4 cm (6 -10 in) outside 30.48-cm (12-in) walkway width or uses assistive device.  5.  GAIT AND PIVOT TURN Instructions: Begin with walking at your normal pace. When I tell you, turn and stop, turn as quickly as you can to face the opposite direction and stop. (3) Normal - Pivot turns safely within 3 seconds and stops quickly with no loss of balance  6.   STEP OVER OBSTACLE Instructions: Begin walking at your normal speed. When you come to the shoe box, step over it, not around it, and keep walking. (2) Mild impairment - Is able to step over one shoe box (11.43 cm [4.5 in] total height) without changing gait speed; no evidence of imbalance.  7.   GAIT WITH NARROW BASE OF SUPPORT Instructions: Walk on the floor with arms folded across the chest, feet aligned heel to toe in tandem for a distance of 3.6 m [12 ft]. The number of steps taken in a straight line are counted for a maximum of 10 steps. (0) Severe impairment - Ambulates less than 4 steps heel to toe or cannot perform without assistance.  8.   GAIT WITH EYES CLOSED Instructions: Walk  at your normal speed from here to the next mark (6 m [20 ft]) with your eyes closed. (0) Severe impairment - Cannot walk 6 m (20 ft) without assistance, severe gait deviations or imbalance, deviates greater than 38.1 cm (15 in) outside 30.48-cm (12-in) walkway width or will not attempt task. 8.28 seconds with significant R veer.  9.   AMBULATING BACKWARDS Instructions: Walk backwards until I tell you to stop (2) Mild impairment - Walks 6 m (20 ft), uses assistive device, slower speed, mild gait deviations, deviates 15.24 -25.4 cm (6 -10 in) outside 30.48-cm (12-in) walkway width. 12.68 seconds  10. STEPS Instructions: Walk up these stairs as you would at home (ie, using the rail if necessary). At the top turn around and walk down. (2) Mild impairment-Alternating feet, must use rail.  Total 18/30   Interpretation of scores: Non-Specific Older Adults Cutoff Score: <=22/30 = risk of falls Parkinsons Disease Cutoff score <15/30= fall risk (Hoehn & Yahr 1-4)  Minimally Clinically Important Difference (MCID)  Stroke (acute, subacute, and chronic) = MDC: 4.2 points Vestibular (acute) = MDC: 6 points Community Dwelling Older Adults =  MCID: 4 points Parkinsons Disease  =  MDC: 4.3 points  (  Academy of Neu[employer]ic Physical Therapy (nd). Functional Gait Assessment. Retrieved from https://www.neuropt.org/docs/default-source/cpgs/core-outcome-measures/function-gait-assessment-pocket-guide-proof9-(2).pdf?sfvrsn=b23f35043_0.)   PATIENT SURVEYS:  TBA                                                                                                                              TREATMENT DATE: 05/25/24  Self Care: BP and HR taken in sitting at rest beginning of session (see below for details). Vitals:   05/25/24 1233  BP: (!) 157/67  Pulse: 66   Therapeutic Exercise: SciFit multi-peaks up to level 5 for 8 minutes using BLEs for strengthening, dynamic cardiovascular warmup and increased amplitude of  stepping. RPE of 6-7/10 following activity.  Average stride length 11.3 inches.  92 bpm HR post activity.  Therapeutic Activity (to improve balance): Standing balance in // bars: 6 foot foam balance beam: Tandem walking (heel to toe) x8 reps Notes:  occasional LOB but pt able to self correct with use of // bars Side stepping (L then R =1 rep) with B LE toe taps forward to gum drops (4 gum drops total each length) x 4 reps Notes: occasional LOB but pt able to self correct with use of // bars Wobble board balance (lateral): Throwing/catching ball (to self) x20 reps medium sized ball; x20 reps small sized ball Notes: occasional LOB requiring assist for balance Mini-squats: x10 reps x2 sets Notes: initial cueing for technique and R LE positioning (to prevent R LE IR during mini-squat); improved from min assist to CGA with cueing for positioning/balance SLS on Airex: x30 seconds x2 sets B LE's Notes: intermittent UE support for balance  PATIENT EDUCATION: Education details: Continue HEP.  Plan for 1 more PT visit (and then anticipate graduation from therapy)--pt in agreement with plan. Person educated: Patient Education method: Explanation and Verbal cues Education comprehension: verbalized understanding, verbal cues required, and needs further education  HOME EXERCISE PROGRAM: Access Code: [email or web address] URL: [email or web address] Date: 05/18/2024 Prepared by: Damien Caulk  Exercises - Standing Tandem Balance with Counter Support  - 1 x daily - 5 x weekly - 2-3 sets - 10 reps - Mini Squat with Counter Support  - 1 x daily - 5 x weekly - 2-3 sets - 10 reps - Heel Raises with Counter Support  - 1 x daily - 5 x weekly - 2-3 sets - 10 reps - Seated Hip Abduction/External Rotation With Resistance at Thighs  - 1 x daily - 5 x weekly - 2-3 sets - 10 reps - Tandem Walking with Counter Support  - 1 x daily - 5 x weekly - 1 sets - 6 reps - Backward Tandem Walking with Counter Support   - 1 x daily - 5 x weekly - 1 sets - 6 reps - Standing Single Leg Stance with Counter Support  - 1 x daily - 5 x weekly - 2-3 sets - 30 second hold  GOALS: Goals reviewed with patient? Yes  SHORT TERM GOALS: Target date: 05/02/2024  Pt  will be independent with initial HEP in order to improve strength and balance in order to decrease fall risk and improve function at home for ADL's. Baseline:  Issued Goal status: MET  2.  Assess FGA and update LTG as needed. Baseline: 18/30 04/16/24 Goal status: MET  3.  Assess and update LTG as needed. Baseline: 1096 feet on 04/16/24 Goal status: MET  4.  Pt will increase by at least 0.13 m/s in order to demonstrate clinically significant improvement in community ambulation.  Baseline: 0.87 m/sec; 1.04 m/sec 04/30/24 Goal status: MET  LONG TERM GOALS: Target date: 05/30/2024  Pt will be independent with final HEP in order to improve strength and balance in order to decrease fall risk and improve function at home for ADL's. Baseline: HEP issued Goal status: INITIAL  2.  Patient will increase Functional Gait Assessment score to > or equal to 22/30 as to reduce fall risk and improve dynamic gait safety with community ambulation. Baseline: 18/30 04/16/24 Goal status: INITIAL  3.  Patient will tolerate 10 seconds of single leg stance B LE's without loss of balance to improve ability to get in and out of shower safely. Baseline: R LE 5.10 seconds; L LE unable to perform without assist for balance Goal status: INITIAL  4.  Pt will increase by at least 66m (196ft) in order to demonstrate clinically significant improvement in cardiopulmonary endurance and community ambulation. Baseline: 1096 feet on 04/16/24; 1289 feet 05/21/24 Goal status: MET (improved by 193 feet)   ASSESSMENT:  CLINICAL IMPRESSION: Patient was seen today for physical therapy treatment to address strength and balance.  Focused session on LE strengthening and balance  (on compliant surface and on wobble board).  Patient continues to be limited by higher level balance.  They demonstrate improvement in balance reaction response (to correct balance).  They would continue to benefit from skilled PT to address impairments as noted and progress towards long term goals.  Plan for 1 more PT session (plan to assess LTG's and review HEP) and anticipate discharge from OP PT.  OBJECTIVE IMPAIRMENTS: Abnormal gait, cardiopulmonary status limiting activity, decreased activity tolerance, decreased balance, decreased coordination, decreased endurance, decreased knowledge of condition, decreased knowledge of use of DME, decreased mobility, difficulty walking, decreased strength, improper body mechanics, postural dysfunction, and pain.   ACTIVITY LIMITATIONS: carrying, lifting, bending, squatting, stairs, bathing, toileting, dressing, and locomotion level  PARTICIPATION LIMITATIONS: meal prep, cleaning, laundry, driving, shopping, community activity, and yard work  PERSONAL FACTORS: Age, Past/current experiences, Transportation, and 1-2 comorbidities: a-fib and PVD are also affecting patient's functional outcome.   REHAB POTENTIAL: Good  CLINICAL DECISION MAKING: Evolving/moderate complexity  EVALUATION COMPLEXITY: Moderate  PLAN:  PT FREQUENCY: 2x/week  PT DURATION: 8 weeks  PLANNED INTERVENTIONS: [id number]- PT Re-evaluation, 97750- Physical Performance Testing, 97110-Therapeutic exercises, 97530- Therapeutic activity, [id number]- Neuromuscular re-education, 97535- Self Care, [id number]- Manual therapy, [id number]- Gait training, [phone]- Orthotic Initial, [phone]- Orthotic/Prosthetic subsequent, [phone]- Aquatic Therapy, [phone]- Electrical stimulation (manual), [id number]- Ultrasound, [phone] (1-2 muscles), [id number] (3+ muscles)- Dry Needling, Patient/Family education, Balance training, Stair training, Taping, Joint mobilization, Spinal mobilization, Scar mobilization, Compression bandaging, DME instructions,  Cryotherapy, Moist heat, and Biofeedback  PLAN FOR NEXT SESSION:  1 more visit to assess LTG's and review HEP (then discharge from therapy).  Higher level balance; SLS.  LE strengthening; LE coordination; balance; aerobic endurance; single leg balance.   Damien Caulk, PT 05/25/2024, 1:35 PM        "

## 2024-05-28 ENCOUNTER — Ambulatory Visit: Admitting: Physical Therapy

## 2024-05-28 ENCOUNTER — Encounter: Payer: Self-pay | Admitting: Physical Therapy

## 2024-05-28 ENCOUNTER — Encounter: Payer: Self-pay | Admitting: Internal Medicine

## 2024-05-28 ENCOUNTER — Ambulatory Visit: Attending: Internal Medicine | Admitting: Internal Medicine

## 2024-05-28 VITALS — BP 140/65 | HR 85

## 2024-05-28 VITALS — BP 142/72 | HR 73 | Ht 62.0 in | Wt 137.7 lb

## 2024-05-28 DIAGNOSIS — I6310 Cerebral infarction due to embolism of unspecified precerebral artery: Secondary | ICD-10-CM | POA: Diagnosis present

## 2024-05-28 DIAGNOSIS — I4819 Other persistent atrial fibrillation: Secondary | ICD-10-CM | POA: Diagnosis present

## 2024-05-28 DIAGNOSIS — D6869 Other thrombophilia: Secondary | ICD-10-CM | POA: Insufficient documentation

## 2024-05-28 DIAGNOSIS — I71012 Dissection of descending thoracic aorta: Secondary | ICD-10-CM | POA: Diagnosis not present

## 2024-05-28 DIAGNOSIS — M6281 Muscle weakness (generalized): Secondary | ICD-10-CM

## 2024-05-28 DIAGNOSIS — I4719 Other supraventricular tachycardia: Secondary | ICD-10-CM | POA: Insufficient documentation

## 2024-05-28 DIAGNOSIS — I4891 Unspecified atrial fibrillation: Secondary | ICD-10-CM | POA: Insufficient documentation

## 2024-05-28 DIAGNOSIS — R2689 Other abnormalities of gait and mobility: Secondary | ICD-10-CM | POA: Diagnosis not present

## 2024-05-28 DIAGNOSIS — I71019 Dissection of thoracic aorta, unspecified: Secondary | ICD-10-CM | POA: Insufficient documentation

## 2024-05-28 DIAGNOSIS — R072 Precordial pain: Secondary | ICD-10-CM | POA: Insufficient documentation

## 2024-05-28 DIAGNOSIS — R278 Other lack of coordination: Secondary | ICD-10-CM

## 2024-05-28 NOTE — Patient Instructions (Signed)
 Medication Instructions:  No Changes  Lab Work: None  Follow-Up: At [facility], you and your health needs are our priority.  As part of our continuing mission to provide you with exceptional heart care, our providers are all part of one team.  This team includes your primary Cardiologist (physician) and Advanced Practice Providers or APPs (Physician Assistants and Nurse Practitioners) who all work together to provide you with the care you need, when you need it.  Your next appointment:   1 year(s)  Provider:   Gayatri A Acharya, MD

## 2024-05-28 NOTE — Progress Notes (Signed)
 " Cardiology Office Note:  .   Date:  05/28/2024  ID:  Erminio JINNY Quale, DOB 20-Nov-1950, MRN [id number] PCP: [name], MD  [facility] Providers Cardiologist:  Soyla DELENA Merck, MD Electrophysiologist:  Eulas FORBES Furbish, MD    History of Present Illness: .   Terri Alexander is a 74 y.o. female.  Discussed the use of AI scribe software for clinical note transcription with the patient, who gave verbal consent to proceed.  History of Present Illness Terri Alexander is a 74 year old female with atrial fibrillation and recent thoracic aortic repair who presents for follow-up after a postoperative embolic stroke, felt possibly secondary to interruptions in anticoagulation.  She recently underwent thoracic endovascular aortic repair with a left subclavian side branch endoprosthesis reinforced with VBX and left axillary, brachial, and subclavian artery stenting. Postoperatively she had a small embolic stroke. She is on aspirin  and Pradaxa  for stroke prevention.  There was concern for atrial fibrillation postoperatively, though she does not perceive irregular heart rhythms. Her home blood pressure has been variable, with some high readings during physical therapy. Metoprolol  was stopped when her rhythm was stable. She continues to monitor her blood pressure at home.  She has atrial fibrillation and prior ablations for supraventricular tachycardia and afib. She is not having palpitations or other symptoms suggestive of atrial fibrillation.  She has intermittent leg edema and uses Lasix  only as needed when swelling is significant or her legs feel tight.  She takes simvastatin  for cholesterol management.  She has a dilated aorta that is under surveillance, with a follow-up imaging planned in June. A recent CCTA scan of the heart was normal.    ROS: negative except per HPI above.  Studies Reviewed: [name]   EKG Interpretation Date/Time:  Monday May 28 2024 09:13:52 EST Ventricular  Rate:  76 PR Interval:  194 QRS Duration:  84 QT Interval:  378 QTC Calculation: 425 R Axis:   38  Text Interpretation: Normal sinus rhythm Anterior infarct (cited on or before 22-Mar-2024) When compared with ECG of 22-Mar-2024 18:06, Sinus rhythm has replaced Supraventricular tachycardia Nonspecific T wave abnormality no longer evident in Inferior leads Nonspecific T wave abnormality no longer evident in Lateral leads Confirmed by Emerick Weatherly ([id number]) on 05/28/2024 9:18:26 AM    Results Labs Lipid panel (05/2024): Within normal limits Iron studies (05/2024): Iron low, but considered acceptable Comprehensive metabolic panel (05/2024): Within normal limits  Radiology Brain MRI: Small embolic infarct Cardiac CT: No coronary artery disease  Diagnostic Transthoracic echocardiogram (03/2024): Normal left and right ventricular function, normal mitral and aortic valves, dilated aorta EKG (05/28/2024): Normal sinus rhythm (Independently interpreted) EKG (03/2024): Atrial fibrillation or supraventricular tachycardia (Independently interpreted) Risk Assessment/Calculations:    CHA2DS2-VASc Score = 3   This indicates a 3.2% annual risk of stroke. The patient's score is based upon: CHF History: 0 HTN History: 0 Diabetes History: 0 Stroke History: 0 Vascular Disease History: 1 Age Score: 1 Gender Score: 1      Physical Exam:   VS:  BP (!) 142/72 (BP Location: Left Arm, Patient Position: Sitting)   Pulse 73   Ht 5' 2 (1.575 m)   Wt 137 lb 11.2 oz (62.5 kg)   LMP  (LMP Unknown)   SpO2 98%   BMI 25.19 kg/m    Wt Readings from Last 3 Encounters:  05/28/24 137 lb 11.2 oz (62.5 kg)  05/02/24 138 lb 8 oz (62.8 kg)  04/26/24 137 lb 3.2 oz (  62.2 kg)     Physical Exam GENERAL: Alert, cooperative, well developed, no acute distress. HEENT: Normocephalic, normal oropharynx, moist mucous membranes. CHEST: Clear to auscultation bilaterally, no wheezes, rhonchi, or  crackles. CARDIOVASCULAR: Normal heart rate and rhythm, S1 and S2 normal without murmurs. ABDOMEN: Soft, non-tender, non-distended, without organomegaly, normal bowel sounds. EXTREMITIES: Mild edema in ankles, no cyanosis. NEUROLOGICAL: Cranial nerves grossly intact, moves all extremities without gross motor or sensory deficit.   ASSESSMENT AND PLAN: .    Assessment and Plan Assessment & Plan Atrial fibrillation, paroxysmal, prior ablation. Currently in normal sinus rhythm. Previous episodes possibly related to recent procedures. Pradaxa  is crucial for stroke prevention due to history of AFib and SVT ablations. - Continue Pradaxa  150 mg bid for stroke prevention. - Monitor for symptoms of AFib such as palpitations or irregular heartbeat. - Use KardiaMobile to monitor heart rhythm at home. - Contact healthcare provider if AFib symptoms occur.  Status post thoracic endovascular aortic repair (TEVAR) with left subclavian side branch and upper extremity stenting Post-procedural recovery ongoing. Follow-up imaging scheduled to monitor repair site. - Continue follow-up with Dr. Lanis for post-procedural care.  Embolic stroke, post-procedural Post-procedural embolic stroke presumed cardioembolic due to interruption of Pradaxa  prior to procedure. Neurologist's assessment supports this etiology. - Continue Pradaxa  150 mg BID for stroke prevention. Also on ASA 81 mg daily, defer to [facility] and neuro on ASA.  Peripheral edema Intermittent peripheral edema managed with as-needed Lasix . No signs of heart failure or other concerning symptoms. - Continue Lasix  as needed for edema management. - Monitor for signs of fluid overload such as shortness of breath or significant weight gain.  Hyperlipidemia Cholesterol levels well-managed with simvastatin  10 mg daily. No coronary artery disease present. - Continue simvastatin  therapy.        Soyla Merck, MD, [facility] "

## 2024-05-28 NOTE — Therapy (Signed)
 " OUTPATIENT PHYSICAL THERAPY NEURO TREATMENT--DISCHARGE  PHYSICAL THERAPY DISCHARGE SUMMARY  Visits from Start of Care: 12  Current functional level related to goals / functional outcomes: Independent with functional mobility.   Remaining deficits: Higher level balance   Education / Equipment: HEP; red thera-band issued.   Patient agrees to discharge. Patient goals were mostly met. Patient is being discharged due to being pleased with the current functional level.  Pt met 3/4 LTG's (progressing with 1 LTG); see LTG's below and clinical impression for details.  Terri Alexander, PT 05/28/2024, 8:56 PM   Patient Name: Terri Alexander MRN: [id number] DOB:30-Nov-1950, 74 y.o., female Today's Date: 05/28/2024   PCP: Duanne Alexander DASEN, MD REFERRING PROVIDER: [name], NP  END OF SESSION:  PT End of Session - 05/28/24 1231     Visit Number 12    Number of Visits 17   16 visits plus eval   Date for Recertification  06/15/24    Authorization Type Medicare A&B/[employer]HC    Progress Note Due on Visit 10   Follow Medicare Guidelines   PT Start Time 1230    PT Stop Time 1309    PT Time Calculation (min) 39 min    Equipment Utilized During Treatment Gait belt    Activity Tolerance Patient tolerated treatment well    Behavior During Therapy WFL for tasks assessed/performed           Past Medical History:  Diagnosis Date   Adenomatous colon polyp    Allergy    Anemia    as a child, no current problem   Arrhythmia    Atrial fibrillation (HCC)    Blood transfusion without reported diagnosis @ 9 months   9 months old   Cataract    bilateral - MD just watching    Dysrhythmia    A. Fib   GERD (gastroesophageal reflux disease)    History of hiatal hernia    Hyperlipidemia    Osteoporosis 04/15/2016   Peripheral vascular disease    Type B Aortic Dissection   Skin cancer 2013   Skin cancer- Nose, face   Stroke [facility])    Past Surgical History:  Procedure Laterality Date    ANGIOPLASTY, ARTERY, SUBCLAVIAN, ENDOVASCULAR, WITH STENT INSERTION Left 03/19/2024   Procedure: LEFT AXILLARY ARTERY STENT INSERTION;  Surgeon: Terri Fonda BRAVO, MD;  Location: [facility] OR;  Service: Vascular;  Laterality: Left;   ATRIAL FIBRILLATION ABLATION N/A 02/16/2023   Procedure: ATRIAL FIBRILLATION ABLATION;  Surgeon: Terri Eulas BRAVO, MD;  Location: [facility] INVASIVE CV LAB;  Service: Cardiovascular;  Laterality: N/A;   CARDIOVERSION N/A 09/09/2022   Procedure: CARDIOVERSION;  Surgeon: Terri Ronal BRAVO, MD;  Location: [facility] INVASIVE CV LAB;  Service: Cardiovascular;  Laterality: N/A;   CESAREAN SECTION  1981   x 1   CESAREAN SECTION     COLONOSCOPY  2016   hx polyp, tics, sigmoid colon mod tortuous Dr Terri Alexander,    EMBOLECTOMY Left 03/19/2024   Procedure: EMBOLECTOMY OF LEFT BRACHIAL ARTERY;  Surgeon: Terri Fonda BRAVO, MD;  Location: [facility] OR;  Service: Vascular;  Laterality: Left;   EYE SURGERY Bilateral    retina repair x 2   FEMORAL ARTERY EXPLORATION Left 03/19/2024   Procedure: EXPOSURE OF BRACHIAL ARTERY WITH STENT INSERTION;  Surgeon: Terri Fonda BRAVO, MD;  Location: [facility] OR;  Service: Vascular;  Laterality: Left;   LAPAROTOMY  1979   removal of ovarian cyst   nissan fundiplication  05/17/1997   REPAIR OF COMPLEX  TRACTION RETINAL DETACHMENT Bilateral 01/2019   SVT ABLATION N/A 12/09/2023   Procedure: SVT ABLATION;  Surgeon: Terri Eulas BRAVO, MD;  Location: [facility] INVASIVE CV LAB;  Service: Cardiovascular;  Laterality: N/A;   THORACIC AORTIC ENDOVASCULAR STENT GRAFT Left 03/19/2024   Procedure: INSERTION, ENDOVASCULAR STENT GRAFT, AORTA, THORACIC; STENTING X2 OF LEFT SUBCLAVIAN ARTERY;  Surgeon: Terri Fonda BRAVO, MD;  Location: [facility] OR;  Service: Vascular;  Laterality: Left;   ULTRASOUND GUIDANCE FOR VASCULAR ACCESS Bilateral 03/19/2024   Procedure: ULTRASOUND GUIDANCE, FOR VASCULAR ACCESS OF BILATERAL FEMORAL AND LEFT RADIAL ARTERY;  Surgeon: Terri Fonda BRAVO, MD;  Location: [facility] OR;  Service: Vascular;   Laterality: Bilateral;   Patient Active Problem List   Diagnosis Date Noted   CVA (cerebral vascular accident) (HCC) 03/23/2024   S/P AAA (abdominal aortic aneurysm) repair 03/19/2024   Thoracic aortic aneurysm, without rupture, unspecified 03/19/2024   Persistent atrial fibrillation (HCC) 08/25/2022   Hypercoagulable state due to persistent atrial fibrillation (HCC) 08/25/2022   Atrial fibrillation with RVR (HCC) 08/18/2022   Aortic dissection (HCC) 08/17/2022   Aortic dissection distal to left subclavian (HCC) 08/17/2022   Diverticulosis 04/19/2022   Diverticulosis of sigmoid colon 04/19/2022   Palpitations 06/24/2021   Premature atrial contraction 06/24/2021   PVC (premature ventricular contraction) 06/24/2021   Abnormal EKG 06/24/2021   Dyslipidemia 03/24/2018   Postmenopausal estrogen deficiency 03/24/2018   Squamous cell carcinoma 08/10/2017   HSV-1 (herpes simplex virus 1) infection 03/16/2017   Osteoporosis 04/15/2016   Vulvar atrophy 11/07/2014   Abnormal Pap smear of cervix 11/07/2014    ONSET DATE: 03/28/2024 (date of referral)  REFERRING DIAG: I63.10 (ICD-10-CM) - Cerebrovascular accident (CVA) due to embolism of precerebral artery (HCC)  THERAPY DIAG:  Other abnormalities of gait and mobility  Muscle weakness (generalized)  Other lack of coordination  Rationale for Evaluation and Treatment: Rehabilitation  SUBJECTIVE: Likes to be called Terri Alexander.                                                                                                                                                                                           SUBJECTIVE STATEMENT: Saw cardiologist this morning; had good report (doesn't have to see for next year).  No recent falls.  No acute changes.  Aware of plan for discharge from OP PT today; pt in agreement. Pt accompanied by: self  PERTINENT HISTORY: Multifocal CVA's s/p TEVAR with subclavian branch endoprosthesis 03/19/24 (LUE and B  groin incisions) to repair thoracic/abdominal aortic aneurysm (chronic type B aortic dissection with aneurysmal degeneration of the descending aorta).  Case complicated by dissection of the left subclavian artery with  need for stenting of the axillary and brachial arteries to reestablish flow to left upper extremity. Pt with episode of transient aphasia that lasted a couple of minutes 03/20/24; episode of unsteadiness while walking 03/21/24.  03/21/24 imaging: Small acute infarcts: B frontal lobes, L occipital lobe, and L cerebellum.  Inpatient rehab stay 03/23/24-03/30/24.  PMH also includes anemia, a-fib, arrhythmia, h/o hiatal hernia, PVD (type B aortic dissection), skin CA (nose, face).  PAIN: 0/10 R LBP Are you having pain? Yes: NPRS scale: 0/10 Pain location: B shoulders Pain description: muscle spasms R side Aggravating factors: unknown Relieving factors: heat; salon pass (patches)  PRECAUTIONS: Fall; no lifting anything heavy with L UE (less than gallon of milk) per pt  RED FLAGS: None   WEIGHT BEARING RESTRICTIONS: No  FALLS: Has patient fallen in last 6 months? No  LIVING ENVIRONMENT: Lives with: lives with their family (husband) Lives in: House/apartment (townhouse; 1 level) Stairs: Yes: External: 1 steps; none Has following equipment at home: Single point cane, shower chair, and Grab bars  PLOF: Independent prior to surgery.  CGA/SBA ambulation without AD in IP rehab.  PATIENT GOALS: Get strength back.  Improve balance.  OBJECTIVE:  Note: Objective measures were completed at Evaluation unless otherwise noted.  DIAGNOSTIC FINDINGS:  Per MRI Brain 03/21/24: 1. Small acute infarcts in the bilateral frontal lobes, left occipital lobe, and left cerebellum. Given involvement of multiple vascular territories, consider embolic etiology.  COGNITION: Overall cognitive status: Within functional limits for tasks assessed   SENSATION: WFL (light touch)  COORDINATION: Good B  heel to shin coordination in sitting.  Difficulty with rapid alternating toe taps in sitting B.  EDEMA:  General LE swelling (has had since surgery)  MUSCLE TONE: WNL  POSTURE: rounded shoulders and forward head  LOWER EXTREMITY MMT:    MMT Right Eval Left Eval  Hip flexion 4/5 4/5  Hip extension    Hip abduction    Hip adduction    Hip internal rotation    Hip external rotation    Knee flexion 4/5 4/5  Knee extension 4+/5 4+/5  Ankle dorsiflexion 4+/5 4+/5  Ankle plantarflexion At least 3/5 AROM At least 3/5 AROM  Ankle inversion    Ankle eversion    (Blank rows = not tested)  BED MOBILITY:  Independent  TRANSFERS: Sit to stand: Modified independence  Assistive device utilized: None     Stand to sit: Modified independence  Assistive device utilized: None      GAIT: Findings: Gait Characteristics: step through gait pattern, Distance walked: clinic distances, Assistive device utilized:None, Level of assistance: SBA, and Comments: no loss of balance ambulating without AD use; pt appearing fatigued though after  FUNCTIONAL TESTS:  5 times sit to stand: 12.09 seconds 10 meter walk test: 0.87 m/sec (11.38 seconds); HR 104 bpm post activity; 1.04 m/sec (9.59 seconds) 04/30/24 SLS: R LE 5.10 seconds; L LE unable to perform without assist for balance (52/56 on BERG 03/29/24 in inpatient rehab) FGA: 18/30 (no AD use) 04/16/24; 24/30 05/28/24 : 1096 feet (no AD use) 04/16/24; 1289 feet 05/21/24  FUNCTIONAL GAIT ASSESSMENT  Date: 04/16/24 Score  GAIT LEVEL SURFACE Instructions: Walk at your normal speed from here to the next mark (6 m) [20 ft]. (2) Mild impairment - Walks 6 m (20 ft) in less than 7 seconds but greater than 5.5 seconds, uses assistive device, slower speed, mild gait deviations, or deviates 15.24 -25.4 cm (6 -10 in) outside of the 30.48-cm (12-in) walkway width.  6.09 seconds  2.   CHANGE IN GAIT SPEED Instructions: Begin walking at your normal pace (for 1.5  m [5 ft]). When I tell you go, walk as fast as you can (for 1.5 m [5 ft]). When I tell you slow, walk as slowly as you can (for 1.5 m [5 ft]. (2) Mild impairment - Is able to change speed but demonstrates mild gait deviations, deviates 15.24 -25.4 cm (6 -10 in) outside of the 30.48-cm (12-in) walkway width, or no gait deviations but unable to achieve a significant change in velocity, or uses an assistive device  3.    GAIT WITH HORIZONTAL HEAD TURNS Instructions: Walk from here to the next mark 6 m (20 ft) away. Begin walking at your normal pace. Keep walking straight; after 3 steps, turn your head to the right and keep walking straight while looking to the right. After 3 more steps, turn your head to the left and keep walking straight while looking left. Continue alternating looking right and left. (3) Normal - Performs head turns smoothly with no change in gait. Deviates no more than 15.24 cm (6 in) outside 30.48-cm (12-in) walkway width.  4.   GAIT WITH VERTICAL HEAD TURNS Instructions: Walk from here to the next mark (6 m [20 ft]). Begin walking at your normal pace. Keep walking straight; after 3 steps, tip your head up and keep walking straight while looking up. After 3 more steps, tip your head down, keep walking straight while looking down. Continue  alternating looking up and down every 3 steps until you have completed 2 repetitions in each direction. (2) Mild impairment - Performs task with slight change in gait velocity (eg, minor disruption to smooth gait path), deviates 15.24 -25.4 cm (6 -10 in) outside 30.48-cm (12-in) walkway width or uses assistive device.  5.  GAIT AND PIVOT TURN Instructions: Begin with walking at your normal pace. When I tell you, turn and stop, turn as quickly as you can to face the opposite direction and stop. (3) Normal - Pivot turns safely within 3 seconds and stops quickly with no loss of balance  6.   STEP OVER OBSTACLE Instructions: Begin walking at your normal  speed. When you come to the shoe box, step over it, not around it, and keep walking. (2) Mild impairment - Is able to step over one shoe box (11.43 cm [4.5 in] total height) without changing gait speed; no evidence of imbalance.  7.   GAIT WITH NARROW BASE OF SUPPORT Instructions: Walk on the floor with arms folded across the chest, feet aligned heel to toe in tandem for a distance of 3.6 m [12 ft]. The number of steps taken in a straight line are counted for a maximum of 10 steps. (0) Severe impairment - Ambulates less than 4 steps heel to toe or cannot perform without assistance.  8.   GAIT WITH EYES CLOSED Instructions: Walk at your normal speed from here to the next mark (6 m [20 ft]) with your eyes closed. (0) Severe impairment - Cannot walk 6 m (20 ft) without assistance, severe gait deviations or imbalance, deviates greater than 38.1 cm (15 in) outside 30.48-cm (12-in) walkway width or will not attempt task. 8.28 seconds with significant R veer.  9.   AMBULATING BACKWARDS Instructions: Walk backwards until I tell you to stop (2) Mild impairment - Walks 6 m (20 ft), uses assistive device, slower speed, mild gait deviations, deviates 15.24 -25.4 cm (6 -10 in) outside 30.48-cm (12-in)  walkway width. 12.68 seconds  10. STEPS Instructions: Walk up these stairs as you would at home (ie, using the rail if necessary). At the top turn around and walk down. (2) Mild impairment-Alternating feet, must use rail.  Total 18/30   Interpretation of scores: Non-Specific Older Adults Cutoff Score: <=22/30 = risk of falls Parkinsons Disease Cutoff score <15/30= fall risk (Hoehn & Yahr 1-4)  Minimally Clinically Important Difference (MCID)  Stroke (acute, subacute, and chronic) = MDC: 4.2 points Vestibular (acute) = MDC: 6 points Community Dwelling Older Adults =  MCID: 4 points Parkinsons Disease  =  MDC: 4.3 points  (Academy of Neurologic Physical Therapy (nd). Functional Gait Assessment. Retrieved from  https://www.neuropt.org/docs/default-source/cpgs/core-outcome-measures/function-gait-assessment-pocket-guide-proof9-(2).pdf?sfvrsn=b53f35043_0.)   PATIENT SURVEYS:  TBA                                                                                                                              TREATMENT DATE: 05/28/24  Self Care: BP and HR taken in sitting at rest beginning of session (see below for details). Vitals:   05/28/24 1232  BP: (!) 140/65  Pulse: 85   Therapeutic Activity: FGA: 24/30 (see below for detailed report) FUNCTIONAL GAIT ASSESSMENT  Date: 05/28/24 Score  GAIT LEVEL SURFACE Instructions: Walk at your normal speed from here to the next mark (6 m) [20 ft]. (2) Mild impairment - Walks 6 m (20 ft) in less than 7 seconds but greater than 5.5 seconds, uses assistive device, slower speed, mild gait deviations, or deviates 15.24 -25.4 cm (6 -10 in) outside of the 30.48-cm (12-in) walkway width. 5.97 seconds  2.   CHANGE IN GAIT SPEED Instructions: Begin walking at your normal pace (for 1.5 m [5 ft]). When I tell you go, walk as fast as you can (for 1.5 m [5 ft]). When I tell you slow, walk as slowly as you can (for 1.5 m [5 ft]. (3) Normal - Able to smoothly change walking speed without loss of balance or gait deviation. Shows a significant difference in walking speeds between normal, fast, and slow speeds. Deviates no more than 15.24 cm (6 in) outside of the 30.48-cm (12-in) walkway width.  3.    GAIT WITH HORIZONTAL HEAD TURNS Instructions: Walk from here to the next mark 6 m (20 ft) away. Begin walking at your normal pace. Keep walking straight; after 3 steps, turn your head to the right and keep walking straight while looking to the right. After 3 more steps, turn your head to the left and keep walking straight while looking left. Continue alternating looking right and left. (3) Normal - Performs head turns smoothly with no change in gait. Deviates no more than 15.24 cm (6 in)  outside 30.48-cm (12-in) walkway width.  4.   GAIT WITH VERTICAL HEAD TURNS Instructions: Walk from here to the next mark (6 m [20 ft]). Begin walking at your normal pace. Keep walking straight; after 3 steps, tip your head up and keep  walking straight while looking up. After 3 more steps, tip your head down, keep walking straight while looking down. Continue  alternating looking up and down every 3 steps until you have completed 2 repetitions in each direction. (3) Normal - Performs head turns with no change in gait. Deviates no more than 15.24 cm (6 in) outside 30.48-cm (12-in) walkway width.  5.  GAIT AND PIVOT TURN Instructions: Begin with walking at your normal pace. When I tell you, turn and stop, turn as quickly as you can to face the opposite direction and stop. (3) Normal - Pivot turns safely within 3 seconds and stops quickly with no loss of balance  6.   STEP OVER OBSTACLE Instructions: Begin walking at your normal speed. When you come to the shoe box, step over it, not around it, and keep walking. (3) Normal - Is able to step over 2 stacked shoe boxes taped together (22.86 cm [9 in] total height) without changing gait speed; no evidence of imbalance.  7.   GAIT WITH NARROW BASE OF SUPPORT Instructions: Walk on the floor with arms folded across the chest, feet aligned heel to toe in tandem for a distance of 3.6 m [12 ft]. The number of steps taken in a straight line are counted for a maximum of 10 steps. (1) Moderate impairment - Ambulates 4 -7 steps.  8.   GAIT WITH EYES CLOSED Instructions: Walk at your normal speed from here to the next mark (6 m [20 ft]) with your eyes closed. (1) Moderate impairment - Walks 6 m (20 ft), slow speed, abnormal gait pattern, evidence for imbalance, deviates 25.4 -38.1 cm (10 -15 in) outside 30.48-cm (12-in) walkway width. Requires more than 9 seconds to ambulate 6 m (20 ft). 9.72 seconds; veers to L  9.   AMBULATING BACKWARDS Instructions: Walk backwards  until I tell you to stop (2) Mild impairment - Walks 6 m (20 ft), uses assistive device, slower speed, mild gait deviations, deviates 15.24 -25.4 cm (6 -10 in) outside 30.48-cm (12-in) walkway width14.12 seconds  10. STEPS Instructions: Walk up these stairs as you would at home (ie, using the rail if necessary). At the top turn around and walk down. (3) Normal-Alternating feet, no rail.  Total 24/30   Interpretation of scores: Non-Specific Older Adults Cutoff Score: <=22/30 = risk of falls Parkinsons Disease Cutoff score <15/30= fall risk (Hoehn & Yahr 1-4)  Minimally Clinically Important Difference (MCID)  Stroke (acute, subacute, and chronic) = MDC: 4.2 points Vestibular (acute) = MDC: 6 points Community Dwelling Older Adults =  MCID: 4 points Parkinsons Disease  =  MDC: 4.3 points  (Academy of Neurologic Physical Therapy (nd). Functional Gait Assessment. Retrieved from https://www.neuropt.org/docs/default-source/cpgs/core-outcome-measures/function-gait-assessment-pocket-guide-proof9-(2).pdf?sfvrsn=b24f35043_0.)  Therapeutic Exercise (reviewed HEP): Standing Tandem Balance with Counter Support x1 rep for 10 seconds B LE's Mini Squat with Counter Support  x10 reps (vc's and demo for technique initially) Heel Raises with Counter Support  x10 reps (B LE's simultaneously) Seated Hip Abduction/External Rotation x10 reps Tandem Walking with Counter Support  x2 reps (x8 feet each rep) Backward Tandem Walking with Counter Support  x2 reps (x8 feet each rep) Standing Single Leg Stance with Counter Support x30 second hold B LE's  PATIENT EDUCATION: Education details: Graduate from PT today (pt aware of plan and in agreement).  LTG results.  Continue HEP. Person educated: Patient Education method: Explanation, Verbal cues, and Handouts Education comprehension: verbalized understanding, verbal cues required, and needs further education  HOME EXERCISE PROGRAM: Access Code:  [email or web address] URL:  [email or web address] Date: 05/28/2024 Prepared by: Terri Alexander  Exercises - Standing Tandem Balance with Counter Support  - 1 x daily - 5 x weekly - 1 sets - 3 reps - 10 second hold - Mini Squat with Counter Support  - 1 x daily - 5 x weekly - 2-3 sets - 10 reps - Heel Raises with Counter Support  - 1 x daily - 5 x weekly - 2-3 sets - 10 reps - Seated Hip Abduction/External Rotation With Resistance at Thighs  - 1 x daily - 5 x weekly - 2-3 sets - 10 reps - Tandem Walking with Counter Support  - 1 x daily - 5 x weekly - 1 sets - 6 reps - Backward Tandem Walking with Counter Support  - 1 x daily - 5 x weekly - 1 sets - 6 reps - Standing Single Leg Stance with Counter Support  - 1 x daily - 5 x weekly - 2-3 sets - 30 second hold  GOALS: Goals reviewed with patient? Yes  SHORT TERM GOALS: Target date: 05/02/2024  Pt will be independent with initial HEP in order to improve strength and balance in order to decrease fall risk and improve function at home for ADL's. Baseline:  Issued Goal status: MET  2.  Assess FGA and update LTG as needed. Baseline: 18/30 04/16/24 Goal status: MET  3.  Assess and update LTG as needed. Baseline: 1096 feet on 04/16/24 Goal status: MET  4.  Pt will increase by at least 0.13 m/s in order to demonstrate clinically significant improvement in community ambulation.  Baseline: 0.87 m/sec; 1.04 m/sec 04/30/24 Goal status: MET  LONG TERM GOALS: Target date: 05/30/2024  Pt will be independent with final HEP in order to improve strength and balance in order to decrease fall risk and improve function at home for ADL's. Baseline: HEP issued Goal status: MET  2.  Patient will increase Functional Gait Assessment score to > or equal to 22/30 as to reduce fall risk and improve dynamic gait safety with community ambulation. Baseline: 18/30 04/16/24; 24/30 05/28/24 Goal status: MET  3.  Patient will tolerate 10 seconds of single leg stance B  LE's without loss of balance to improve ability to get in and out of shower safely. Baseline: R LE 5.10 seconds; L LE unable to perform without assist for balance.  L LE 9 seconds and R LE 6.62 seconds 05/28/24 Goal status: PROGRESSING  4.  Pt will increase by at least 38m (165ft) in order to demonstrate clinically significant improvement in cardiopulmonary endurance and community ambulation. Baseline: 1096 feet on 04/16/24; 1289 feet 05/21/24 Goal status: MET (improved by 193 feet)   ASSESSMENT:  CLINICAL IMPRESSION: Patient was seen today for physical therapy treatment to address LTG's and HEP.  Focused session on assessing LTG's, outcome measure, and reviewing HEP.  Pt met 3/4 LTG's and progressing with 1/4 LTG's.  Pt still limited with SLS (L LE 9 seconds and R LE 6.62 seconds today) but improved (LTG progressing) from R LE 5.10 seconds and L LE unable to perform without assist for balance on Eval.  Pt scored 24/30 on FGA indicating pt is not at increased risk of falls (</= 22/30 = risk of falls in elderly); improved from 18/30 on 04/16/24.  Reviewed HEP with pt; pt demonstrates appropriate understanding of HEP.  Pt reports feeling pleased with current functional level and in agreement with discharge from OP PT today.    OBJECTIVE IMPAIRMENTS:  Abnormal gait, cardiopulmonary status limiting activity, decreased activity tolerance, decreased balance, decreased coordination, decreased endurance, decreased knowledge of condition, decreased knowledge of use of DME, decreased mobility, difficulty walking, decreased strength, improper body mechanics, postural dysfunction, and pain.   ACTIVITY LIMITATIONS: carrying, lifting, bending, squatting, stairs, bathing, toileting, dressing, and locomotion level  PARTICIPATION LIMITATIONS: meal prep, cleaning, laundry, driving, shopping, community activity, and yard work  PERSONAL FACTORS: Age, Past/current experiences, Transportation, and 1-2 comorbidities:  a-fib and PVD are also affecting patient's functional outcome.   REHAB POTENTIAL: Good  CLINICAL DECISION MAKING: Evolving/moderate complexity  EVALUATION COMPLEXITY: Moderate  PLAN:  PT FREQUENCY: 2x/week  PT DURATION: 8 weeks  PLANNED INTERVENTIONS: [id number]- PT Re-evaluation, 97750- Physical Performance Testing, 97110-Therapeutic exercises, 97530- Therapeutic activity, [id number]- Neuromuscular re-education, 97535- Self Care, [id number]- Manual therapy, [id number]- Gait training, [phone]- Orthotic Initial, [phone]- Orthotic/Prosthetic subsequent, [phone]- Aquatic Therapy, [phone]- Electrical stimulation (manual), [id number]- Ultrasound, [phone] (1-2 muscles), [id number] (3+ muscles)- Dry Needling, Patient/Family education, Balance training, Stair training, Taping, Joint mobilization, Spinal mobilization, Scar mobilization, Compression bandaging, DME instructions, Cryotherapy, Moist heat, and Biofeedback  PLAN FOR NEXT SESSION:  Discharge from OP PT today.   Terri Alexander, PT 05/28/2024, 8:56 PM        "

## 2024-06-01 ENCOUNTER — Ambulatory Visit: Admitting: Physical Therapy

## 2024-06-04 ENCOUNTER — Ambulatory Visit: Admitting: Physical Therapy

## 2024-06-08 ENCOUNTER — Ambulatory Visit: Admitting: Physical Therapy

## 2024-10-25 ENCOUNTER — Ambulatory Visit (HOSPITAL_COMMUNITY)

## 2024-10-25 ENCOUNTER — Ambulatory Visit: Admitting: Vascular Surgery
